# Patient Record
Sex: Male | Born: 1935 | Race: White | Hispanic: No | Marital: Married | Smoking: Never smoker
Health system: Southern US, Community
[De-identification: ages and names within clinical notes are randomized; demographics above are authoritative.]

## PROBLEM LIST (undated history)

## (undated) DIAGNOSIS — N200 Calculus of kidney: Secondary | ICD-10-CM

## (undated) DIAGNOSIS — B351 Tinea unguium: Secondary | ICD-10-CM

## (undated) DIAGNOSIS — K5790 Diverticulosis of intestine, part unspecified, without perforation or abscess without bleeding: Secondary | ICD-10-CM

## (undated) DIAGNOSIS — I82409 Acute embolism and thrombosis of unspecified deep veins of unspecified lower extremity: Secondary | ICD-10-CM

## (undated) DIAGNOSIS — I739 Peripheral vascular disease, unspecified: Secondary | ICD-10-CM

## (undated) DIAGNOSIS — E119 Type 2 diabetes mellitus without complications: Secondary | ICD-10-CM

## (undated) DIAGNOSIS — R001 Bradycardia, unspecified: Secondary | ICD-10-CM

## (undated) DIAGNOSIS — K219 Gastro-esophageal reflux disease without esophagitis: Secondary | ICD-10-CM

## (undated) DIAGNOSIS — I4821 Permanent atrial fibrillation: Secondary | ICD-10-CM

## (undated) DIAGNOSIS — I2510 Atherosclerotic heart disease of native coronary artery without angina pectoris: Secondary | ICD-10-CM

## (undated) DIAGNOSIS — M109 Gout, unspecified: Secondary | ICD-10-CM

## (undated) DIAGNOSIS — I2721 Secondary pulmonary arterial hypertension: Secondary | ICD-10-CM

## (undated) DIAGNOSIS — I350 Nonrheumatic aortic (valve) stenosis: Secondary | ICD-10-CM

## (undated) DIAGNOSIS — R0602 Shortness of breath: Secondary | ICD-10-CM

## (undated) DIAGNOSIS — Z954 Presence of other heart-valve replacement: Secondary | ICD-10-CM

## (undated) DIAGNOSIS — N400 Enlarged prostate without lower urinary tract symptoms: Secondary | ICD-10-CM

## (undated) HISTORY — DX: Permanent atrial fibrillation: I48.21

## (undated) HISTORY — PX: PARAESOPHAGEAL HERNIA REPAIR: SHX2161

## (undated) HISTORY — PX: CORONARY ARTERY BYPASS GRAFT: SHX141

## (undated) HISTORY — DX: Gastro-esophageal reflux disease without esophagitis: K21.9

## (undated) HISTORY — DX: Tinea unguium: B35.1

## (undated) HISTORY — PX: HERNIA REPAIR: SHX51

## (undated) HISTORY — DX: Gout, unspecified: M10.9

---

## 1998-03-10 ENCOUNTER — Emergency Department (HOSPITAL_COMMUNITY): Admission: EM | Admit: 1998-03-10 | Discharge: 1998-03-10 | Payer: Self-pay | Admitting: Emergency Medicine

## 1998-03-16 ENCOUNTER — Ambulatory Visit (HOSPITAL_COMMUNITY): Admission: RE | Admit: 1998-03-16 | Discharge: 1998-03-16 | Payer: Self-pay | Admitting: Gastroenterology

## 1998-03-16 ENCOUNTER — Encounter: Payer: Self-pay | Admitting: Gastroenterology

## 2002-02-26 ENCOUNTER — Encounter: Payer: Self-pay | Admitting: Emergency Medicine

## 2002-02-27 ENCOUNTER — Encounter: Payer: Self-pay | Admitting: Internal Medicine

## 2002-02-27 ENCOUNTER — Inpatient Hospital Stay (HOSPITAL_COMMUNITY): Admission: EM | Admit: 2002-02-27 | Discharge: 2002-03-03 | Payer: Self-pay | Admitting: *Deleted

## 2002-03-01 ENCOUNTER — Encounter (INDEPENDENT_AMBULATORY_CARE_PROVIDER_SITE_OTHER): Payer: Self-pay | Admitting: Cardiology

## 2002-10-13 ENCOUNTER — Emergency Department (HOSPITAL_COMMUNITY): Admission: EM | Admit: 2002-10-13 | Discharge: 2002-10-13 | Payer: Self-pay | Admitting: Emergency Medicine

## 2002-10-13 ENCOUNTER — Encounter: Payer: Self-pay | Admitting: Emergency Medicine

## 2004-02-29 HISTORY — PX: AORTIC VALVE REPLACEMENT: SHX41

## 2004-04-19 ENCOUNTER — Emergency Department (HOSPITAL_COMMUNITY): Admission: EM | Admit: 2004-04-19 | Discharge: 2004-04-19 | Payer: Self-pay | Admitting: Emergency Medicine

## 2004-04-28 ENCOUNTER — Ambulatory Visit: Payer: Self-pay | Admitting: Dentistry

## 2004-04-28 ENCOUNTER — Inpatient Hospital Stay (HOSPITAL_COMMUNITY): Admission: RE | Admit: 2004-04-28 | Discharge: 2004-05-04 | Payer: Self-pay | Admitting: *Deleted

## 2004-05-03 ENCOUNTER — Ambulatory Visit: Payer: Self-pay | Admitting: Dentistry

## 2004-05-07 ENCOUNTER — Encounter: Admission: AD | Admit: 2004-05-07 | Discharge: 2004-05-07 | Payer: Self-pay | Admitting: Dentistry

## 2004-05-11 ENCOUNTER — Encounter (INDEPENDENT_AMBULATORY_CARE_PROVIDER_SITE_OTHER): Payer: Self-pay | Admitting: Specialist

## 2004-05-11 ENCOUNTER — Inpatient Hospital Stay (HOSPITAL_COMMUNITY): Admission: RE | Admit: 2004-05-11 | Discharge: 2004-05-16 | Payer: Self-pay | Admitting: Cardiothoracic Surgery

## 2004-06-21 ENCOUNTER — Encounter (HOSPITAL_COMMUNITY): Admission: RE | Admit: 2004-06-21 | Discharge: 2004-09-19 | Payer: Self-pay | Admitting: *Deleted

## 2005-06-22 ENCOUNTER — Emergency Department (HOSPITAL_COMMUNITY): Admission: EM | Admit: 2005-06-22 | Discharge: 2005-06-22 | Payer: Self-pay | Admitting: Emergency Medicine

## 2005-07-12 ENCOUNTER — Encounter (INDEPENDENT_AMBULATORY_CARE_PROVIDER_SITE_OTHER): Payer: Self-pay | Admitting: *Deleted

## 2005-07-12 ENCOUNTER — Inpatient Hospital Stay (HOSPITAL_COMMUNITY): Admission: AD | Admit: 2005-07-12 | Discharge: 2005-07-14 | Payer: Self-pay | Admitting: General Surgery

## 2006-11-24 ENCOUNTER — Encounter: Admission: RE | Admit: 2006-11-24 | Discharge: 2006-11-24 | Payer: Self-pay | Admitting: Orthopedic Surgery

## 2006-12-05 ENCOUNTER — Encounter: Admission: RE | Admit: 2006-12-05 | Discharge: 2006-12-05 | Payer: Self-pay | Admitting: Orthopedic Surgery

## 2010-07-16 NOTE — Cardiovascular Report (Signed)
NAMEMYKAL, BATIZ NO.:  [id number]   MEDICAL RECORD NO.:  [id number]          PATIENT TYPE:  OIB   LOCATION:  2899                         FACILITY:  [facility]   PHYSICIAN:  Meade Maw, M.D.    DATE OF BIRTH:  09-27-1925   DATE OF PROCEDURE:  04/28/2004  DATE OF DISCHARGE:                              CARDIAC CATHETERIZATION   INDICATIONS FOR PROCEDURE:  A 77 year old gentleman with new onset chest  pain and known calcific aortic stenosis with an aortic valve area of 1.1 sq  cm.   DESCRIPTION OF PROCEDURE:  After obtaining written informed consent, the  patient was brought to the cardiac catheterization lab in a postabsorptive  state.  Preoperative sedation was achieved using IV Versed 1 mg.  The right  groin was prepped and draped in the usual sterile fashion.  Local anesthesia  was achieved using 1% Xylocaine.  A 6 French hemostasis sheath was placed  into the right femoral artery using the modified Seldinger technique.  Selective coronary angiography was performed using a JL4, JR4 Judkins  catheter.  Multiple views were obtained.  All catheter exchanges were made  over a guidewire.  The hemostasis sheath was flushed following each catheter  exchange.  Left heart catheterization was performed using a Feldman  catheter.  This was then exchanged for a 6 French pigtail curved catheter.  Single plane ventriculogram was performed in the RAO position.  The patient  was then transferred to the holding area.  There was no immediate  complication.   FINDINGS:  The aortic pressure was 104/62.  LV pressure was 152/8 with an  EDP of 11.  His mean gradient was noted to be 33 mmHg.  Single plane  ventriculogram revealed normal wall motion, ejection fraction of 65%.   Coronary angiography:  The left main coronary artery is short.  It  bifurcates into the left anterior descending and circumflex.  This is a  conjoined takeoff.   The left anterior descending:  The left  anterior descending gives rise to a  large D1 and ends as a small tapering apical branch.   The circumflex vessel is a large caliber vessel giving rise to a large OM1,  a large OM2, large OM3 and ends as an AV groove vessel. There is no disease  noted in the circumflex or its branches.   Right coronary artery: The right coronary artery is a moderate size vessel.  There is 100% occlusion in the right coronary artery.  There appears to be  thrombus in the right coronary artery.   FINAL IMPRESSION:  1.  A 100% occlusion of the mid right coronary artery with thrombotic      appearing material.  This is most likely      accounting for his chest pain.  2.  Severe calcific aortic stenosis.   RECOMMENDATIONS:  The patient will be admitted.  CVTS consult will be  obtained.      HP/MEDQ  D:  04/28/2004  T:  04/28/2004  Job:  [id number]   cc:   Georgann Housekeeper, MD  [address]  [address]  [place]  [place] [address]  Fax: [phone]

## 2010-07-16 NOTE — Discharge Summary (Signed)
NAMEADE, STMARIE NO.:  [id number]   MEDICAL RECORD NO.:  [id number]          PATIENT TYPE:  INP   LOCATION:  3708                         FACILITY:  [facility]   PHYSICIAN:  Meade Maw, M.D.    DATE OF BIRTH:  04/27/1925   DATE OF ADMISSION:  04/28/2004  DATE OF DISCHARGE:  05/04/2004                                 DISCHARGE SUMMARY   CHIEF COMPLAINT AND REASON FOR ADMISSION:  Mr. Dentinger is a patient with  known severe aortic stenosis secondary to calcification.  He was admitted on  April 28, 2004 post cardiac catheterization.  That catheterization revealed  severe calcific aortic stenosis, 100% mid vessel occlusion of right coronary  artery with thrombus.  The patient also had a pre-admission history of chest  pain.  Based on these findings, the patient was admitted for further CVTS  evaluation regarding single vessel coronary artery disease and severe  calcific aortic stenosis.   HOSPITAL COURSE:  Problem 1.  Severe single vessel coronary artery disease  with right coronary artery and thrombus.  The patient was admitted and  started on IV heparin with plans to keep the patient in the hospital until  surgery unless otherwise recommended by CVTS.  He was stable without  evidence of chest pain during the hospitalization and tolerated the heparin  without problems related to anemia or bleeding.  Because the patient had a  severe aortic stenosis as well, and issues related to significant dental  caries, a dental consultation was obtained.  Their recommendation was to  proceed with dental surgery.  The patient underwent multiple dental  extractions on Monday, May 03, 2004 without any problems.  He was followed  by Dr. Kristin Bruins in the hospital for this and CVTS stated that the patient,  at this point, would be okay to be discharged home with returning in one  week for coronary artery bypass graft, aortic valve replacement and Mae's  procedure.  The patient was  subsequently discharged home on May 04, 2004  with plans to return on May 11, 2004 for stated open heart surgery  procedure.   FINAL DISCHARGE DIAGNOSES:  1.  Severe single vessel coronary artery disease involving the right      coronary artery with thrombus status post IV heparin treatment.  2.  Severe calcific aortic stenosis.  3.  Hypertension, controlled.  4.  Dyslipidemia.  5.  Atrial flutter, rate controlled.  6.  Diabetes.  7.  Status post multiple dental extractions pending aortic valve      replacement.   DISCHARGE MEDICATIONS:  1.  Altace 5 mg daily.  2.  Aspirin 81 mg daily.  3.  Zantac 150 mg b.i.d.  4.  Zocor 40 mg daily.  5.  Glimepiride 4 mg daily.  6.  Aciphex 20 mg daily.  7.  Amoxicillin 500 mg t.i.d. for six days.  8.  Percocet 5/325 one to two every six hours as needed for dental pain.   PAIN MANAGEMENT:  Tylenol when not utilizing Percocet.   ACTIVITY:  No strenuous activity until after surgery completed and per  CVTS  recommendations.   DIET:  Heart healthy.   WOUND CARE:  Per recommendations of the dental surgeon.  Salt water rinses  as needed.   ADDITIONAL INSTRUCTIONS:  You are to call Dr. Lindell Spar office if you  experience any more chest pain.  You are to call Dr. Kristin Bruins for an  appointment or if you have any trouble with your mouth.  This is at  [phone]  [phone].   FOLLOW-UP APPOINTMENTS:  You are scheduled to have open heart surgery with  valve replacement on Tuesday, May 11, 2004.  You are to call Dr. Donata Clay  at [phone] if you have any questions regarding your surgery.   The patient already has post coronary artery bypass graft follow-up plan  with Dr. Fraser Din on June 02, 2004 at 11:15 a.m.  He is to go in before this  at 10:30 a.m. on same date for chest x-ray.      ALE/MEDQ  D:  06/28/2004  T:  06/28/2004  Job:  [id number]   cc:   Mikey Bussing, M.D.  [address]  [place]  [place] [address]   Charlynne Pander,  D.D.S.  [facility]  [address]  [place]  [place] [address]  Fax: [phone]   Georgann Housekeeper, MD  [address]  [place]  [place] [address]  Fax: [phone]

## 2010-07-16 NOTE — Discharge Summary (Signed)
NAMECHRISTIFER, [name]               ACCOUNT NO.:  [id number]   MEDICAL RECORD NO.:  [id number]          PATIENT TYPE:  INP   LOCATION:  1615                         FACILITY:  [facility]   PHYSICIAN:  Sharlet Salina T. Hoxworth, M.D.DATE OF BIRTH:  1925-05-06   DATE OF ADMISSION:  07/12/2005  DATE OF DISCHARGE:  07/14/2005                                 DISCHARGE SUMMARY   DISCHARGE DIAGNOSIS:  Infected hematoma, right thigh.   PROCEDURES:  Incision, drainage and debridement of infected hematoma, right  thigh, Jul 12, 2005.   HISTORY OF PRESENT ILLNESS:  [name] is a 77 year old male who was  accidentally run over by his tractor about 2 weeks prior to admission  sustaining injury to his right thigh.  There was no break in the skin.  He  was on chronic anticoagulation with Coumadin and developed a large area of  progressive swelling over his right thigh.  His Coumadin has been stopped.  He has been followed by his primary care physician with increasing swelling,  pain, some redness and tenderness developing at his recent evaluation and  was referred for further treatment.   PAST MEDICAL HISTORY:  1.  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  2.  Abdominal surgery including bowel obstruction and incisional hernia      repair.   MEDICATIONS:  1.  Toprol XL 25 mg daily.  2.  Altace 5 mg daily.  3.  Cephalexin 5 mg t.i.d. during this current illness.  4.  Glimepiride 4 mg q.a.m. and 2 mg in the evening.  5.  Ranitidine 150 mg b.i.d.  6.  Simvastatin 40 mg nightly.  7.  Aspirin and Coumadin which had been stopped during the current illness.   ALLERGIES:  DEMEROL.   For social history, family history and review of systems, see the admission  History and Physical.   PHYSICAL EXAMINATION:  VITAL SIGNS:  Afebrile.  Vital signs within normal  limits.  Pertinent findings are limited to the right thigh which revealed  approximately 19 x 14-cm area of soft swelling and tenderness with  a central  eschar measuring about 8 x 5 cm.  There is a fair amount of cellulitis of  the surrounding skin.   HOSPITAL COURSE:  The patient was admitted to [facility] and  underwent incision and debridement of a large right thigh hematoma with  debridement of necrotic skin and subcutaneous tissue overlying this.  The  wound was packed open.  There was odor, although not any obvious pus.  He  was treated with IV antibiotic.  Culture eventually grew abundant Morganella  morgagni.  The patient was treated with moist saline wound packing b.i.d.  and had significant improvement in the cellulitis.  He became afebrile and  the wound appeared clean.  He is discharged home on Jul 14, 2005.   DISCHARGE MEDICATIONS:  The same as admission plus he is to resume his  regular anticoagulation regimen.   WOUND CARE:  Home Health will follow up for wound packing up.   FOLLOW UP:  Return to office in 1  week for followup.      Lorne Skeens. Hoxworth, M.D.  Electronically Signed     BTH/MEDQ  D:  08/15/2005  T:  08/16/2005  Job:  [id number]

## 2010-07-16 NOTE — Consult Note (Signed)
[name], [name] NO.:  [id number]   MEDICAL RECORD NO.:  [id number]          PATIENT TYPE:  OIB   LOCATION:  6527                         FACILITY:  [facility]   PHYSICIAN:  Kathlee Nations Trigt III, M.D.DATE OF BIRTH:  01-Oct-1925   DATE OF CONSULTATION:  04/28/2004  DATE OF DISCHARGE:                                   CONSULTATION   REASON FOR CONSULTATION:  Aortic stenosis and single vessel coronary artery  disease with chest pain and shortness of breath.   HISTORY OF PRESENT ILLNESS:  I was asked to evaluate this 77 year old white  male type 2 diabetic for potential aortic valve replacement and coronary  bypass surgery for recently diagnosed severe aortic stenosis and single  vessel coronary artery disease.  The patient has had a long history of a  cardiac murmur and known aortic stenosis and insufficiency following serial  2D echos.  His last echo in November 2005 demonstrated an aortic valve area  of 1.1 with a mean gradient of 30 mmHg and 1-2+ aortic insufficiency with  good LV function.  Last week, he developed symptoms of shortness of breath  and chest pain during some cold weather.  He was evaluated in the emergency  department where his cardiac enzymes were negative and his EKG showed no  ischemic changes.  He was released to the care of his primary care  physician, Dr. Donette Larry, who suspected coronary disease and obtained a  cardiology consultation.  Dr. Fraser Din scheduled a cardiac catheterization  today which demonstrated recent 100% occlusion of the right coronary, normal  left main, LAD, and circumflex, EF of 60%, and moderate to severe aortic  stenosis with transvalvular mean gradient of 33 mmHg and a calculated area  of 1.1.  There was mild to moderate aortic insufficiency.  The aortic  leaflets were fairly heavily calcified.  The patient was also noted to be in  atrial fibrillation/flutter of new onset and was placed on Cardizem.  The  patient was  admitted to the hospital following cardiac catheterization and a  cardiothoracic evaluation was requested.  The patient is currently stable in  the cardiac cath observation unit.   PAST MEDICAL HISTORY:  1.  Type 2 diabetes mellitus.  2.  Hypertension.  3.  Gastroesophageal reflux disease.  4.  Kidney stones.  5.  Mild BPH.  6.  Diverticular disease of the colon.  7.  Status post repair of paraesophageal hernia in 1984 with a left      thoracotomy followed several days later by a left anterior thoracotomy      and laparotomy for apparent obstruction.   ALLERGIES:  Demerol causes hallucinations.   CURRENT MEDICATIONS:  Cardizem CD 180 mg p.o. daily, Zocor 40 mg p.o. daily,  AcipHex 20 mg p.o. daily, Amaryl  4 mg p.o. daily, and aspirin 81 mg daily.   SOCIAL HISTORY:  The patient is a Biomedical engineer.  He is married and  lives with his wife in [place].  He has never smoked or  used alcohol.  He remains quite active.   FAMILY HISTORY:  Positive for gastric carcinoma in his father, positive  Alzheimer's disease in a brother, positive for diabetes.   REVIEW OF SYMPTOMS:  CONSTITUTIONAL:  Review is negative for weight loss or  fever.  HEENT:  Review is negative for change in vision or difficulty  swallowing.  He has not seen a dentist in several years.  He has upper total  dental plate and his dentition in his mandible was in poor condition.  Thoracic review is positive for his two thoracotomy procedures, left  posterolateral thoracotomy initially followed by left anterior thoracotomy  in 1984 for repair of his paraesophageal hernia.  He denies any significant  thoracic trauma.  He denies URI, bronchitis, or pneumonia this winter.  CARDIAC:  Review is positive for his shortness of breath and chest pain and  a long history of cardiac murmur since he had rheumatic fever at age 87.  GI:  Review is positive for gastroesophageal reflux disease, diverticular  disease,  and several incisional herniae which have been repaired with mesh  and, at this point, are stable.  He denies a change in bowel habits.  He  does have diverticular disease of the colon.  UROLOGICAL:  Review is  positive for kidney stones and BPH.  ENDOCRINE:  Review is positive for  diabetes, negative for thyroid disease.  HEMATOLOGIC:  Negative for bleeding  disorder or blood transfusion.  NEUROLOGICAL:  Negative for seizure or  stroke.   PHYSICAL EXAMINATION:  VITAL SIGNS:  The patient is 6 feet and weighs 200 pounds, blood pressure  140/74, heart rate 80, respirations 18.  GENERAL:  Pleasant, elderly white male in his hospital room in no acute  distress.  HEENT:  Normocephalic, full EOMs, pharynx clear, dentition in poor repair,  total upper dental plate.  NECK:  Supple JVD, mass, or carotid bruits.  LYMPHATICS:  Reveal no palpable supraclavicular or axillary adenopathy.  LUNGS:  Clear.  He has a well healed left posterolateral thoracotomy  incision and a left anterior thoracotomy epicostal margin.  CARDIAC EXAM:  Grade 3/6 systolic ejection murmur at the right upper sternal  border of aortic stenosis. There is no diastolic murmur.  ABDOMEN:  Soft without pulsatile mass, organomegaly, or tenderness.  EXTREMITIES:  No clubbing, edema, or cyanosis.  Peripheral pulses are 2+.  There are no diabetic ulcers of the skin.  NEUROLOGIC:  Alert and oriented without focal motor deficit.   LABORATORY DATA:  I reviewed the cardiac cath and 2D echo by Dr. Fraser Din and  agree with the diagnosis of significant aortic stenosis with severe single  vessel coronary artery disease and recent onset of atrial  fibrillation/flutter.   IMPRESSION:  The patient will need coronary artery bypass grafting with a  vein graft to the right coronary and aortic valve replacement with a  bioprosthetic valve and a possible Maze procedure.  Prior to cardiac surgery, however, he will need a dental evaluation possible  retraction of  necrotic teeth.  We will initiate the dental evaluation  and therapy as soon as possible.  He probably should remain in the hospital  to have his dental work done since he has significant aortic stenosis and  recent coronary occlusion.   Thank you for the opportunity to see this patient.      PV/MEDQ  D:  04/28/2004  T:  04/28/2004  Job:  [id number]   cc:   Meade Maw, M.D.  [address]  [place]  [place] [address]  Fax: [phone]  Georgann Housekeeper, MD  [address]  [place]  [place] [address]  Fax: [phone]

## 2010-07-16 NOTE — Consult Note (Signed)
NAMESULEYMAN, EHRMAN NO.:  [id number]   MEDICAL RECORD NO.:  [id number]          PATIENT TYPE:  INP   LOCATION:  6527                         FACILITY:  [facility]   PHYSICIAN:  Charlynne Pander, D.D.S.DATE OF BIRTH:  02/25/1926   DATE OF CONSULTATION:  04/30/2004  DATE OF DISCHARGE:                                   CONSULTATION   HISTORY OF PRESENT ILLNESS:  [name] is a 77 year old male referred by  Dr Kathlee Nations Trigt for a dental consultation.  Patient with recent  identification of severe aortic stenosis and single vessel coronary artery  disease.  Patient with anticipated aortic valve replacement along with  coronary artery bypass graft surgery with Dr. Donata Clay in the future.  Patient is now seen as part of a pre-heart valve surgery dental protocol to  rule out dental infection which may affect the patient's systemic health and  anticipated heart valve surgery.   PAST MEDICAL HISTORY:  1.  Coronary artery disease.      1.  Cardiac catheterization on April 28, 2004, which revealed single          vessel coronary artery disease of the right coronary artery,          moderate to severe aortic stenosis and ejection fraction of          approximately 65%.      2.  Anticipated single vessel coronary artery bypass graft with doctor          Kathlee Nations Trigt along with the aortic valve replacement.  2.  Severe aortic stenosis-anticipated aortic valve replacement heart      surgery with Dr. Donata Clay as above.  3.  Type 2 diabetes mellitus.  4.  Hypertension.  5.  Gastroesophageal reflux disorder.  6.  History of nephrolithiasis in August 2004.  7.  Recent onset of atrial fibrillation/flutter, possible Cox-Maze procedure      in the future.  8.  Benign prostatic hypertrophy - mild.  9.  History of diverticulosis of the colon.  10. History of gastric ulcer in 1982.  11. History of rheumatic fever in 1942.  12. History of DVT of the lower left extremity.  13. Hyperlipidemia, currently on Zocor.  14. History of cataracts.  15. Status post repair for periesophageal hernia in 1984.   ALLERGIES/ADVERSE DRUG REACTIONS:  DEMEROL CAUSES HALLUCINATIONS.   MEDICATIONS:  1.  Diltiazem 180 mg daily.  2.  Zocor 40 mg every evening.  3.  Amaryl 4 mg daily.  4.  Protonix 40 mg daily.  5.  Altace 5 mg daily.  6.  Aspirin 81 mg daily.  7.  Pepcid 20 mg twice daily.  8.  Chlorhexidine rinses twice daily.  9.  Heparin IV per protocol.  10. Tylox 1-2 q.3h. p.r.n. pain.   SOCIAL HISTORY:  The patient is married and lives with his wife.  The  patient is a retired Visual merchandiser.  The patient denies use of alcohol or tobacco  products.   FAMILY HISTORY:  Father died at the age of 24 from stomach cancer.  Mother  died at the age of 30.   FUNCTIONAL ASSESSMENT:  The patient was independent of her ADL's prior to  this admission.   REVIEW OF SYSTEMS:  As reviewed from the chart and health history assessment  forms this admission.   DENTAL HISTORY:   CHIEF COMPLAINT:  The patient is a pre-heart valve surgery dental protocol  evaluation.   HISTORY OF PRESENT ILLNESS:  Patient with recent identification of severe  aortic stenosis and single vessel coronary artery disease.  Patient with  anticipated aortic valve replacement along with a single vessel coronary  artery bypass graft heart procedure with Dr. Donata Clay.  The patient is now  seen as a part of a pre-heart valve surgery dental protocol to rule out  dental effects which may effect the patient's systemic health and  anticipated heart valve surgery.   The patient currently denies acute tooth ache, swellings or abscesses.  The  patient last saw dentist several years ago.  The patient indicates that he  had a filling placed at that time by Dr. Renata Caprice, who has since retired.  The  patient has no regular dentist at this time.  The patient has an upper  complete denture that fits pretty good.  The patient  indicates that the  initial denture was placed in 1976 and replaced in 1989, and the denture  works well.  The patient denies presence of a lower partial denture.  The  patient indicates that he had a lower left tooth breakoff in December 2005.  The patient, again, denies or swelling or problems with this tooth.   DENTAL EXAMINATION:  GENERAL:  The patient is a well-developed, well-  nourished male in no acute distress.  HEENT:  There is no palpable lymphadenopathy.  There are no acute TMJ  symptoms.  INTERNAL:  The patient has normal saliva.  There is no evidence of abscess  formation within the mouth.  There is no evidence of denture irritation.  DENTITION:  The patient is missing all maxillary teeth along with tooth  members 17, 18, 19, 28, 30, 31 and 32.  Tooth #20 is present as a root  segment.  Tooth #28 has been replaced as part of a bridge.  PERIODONTAL:  Patient with chronic periodontitis with plaque and calculus  accumulations, gingival recession, incipient tooth mobility and moderate  horizontal vertical bone loss.  DENTAL CARIES:  Tooth #22 appears to be affected by caries, and other caries  can be evaluated by obtaining dental x-rays in the future.  ENDODONTIC:  The patient currently denies acute pulpitis symptoms.  There  are no obvious periapical radiolucencies, although, the panoramic x-ray is  suboptimal.  Suggest obtaining dental x-rays in the future to rule out  incipient periapical pathology.  CROWN AND BRIDGE:  The patient has several crown and bridge restorations  which appear to be clinically acceptable at this time.  PROSTHODONTICS:  Patient with an upper complete denture which is acceptable  at this time.  The patient does not have a lower partial denture.  OCCLUSION:  Patient with a poor occlusal scheme, but stable occlusion at  this time.   STUDIES:  Radiographic interpretation of suboptimal panoramic x-ray was taken on April 29, 2004.   There are multiple  missing teeth.  There is a retained root segment in #20.  There is a dental caries associated with tooth #22.  There is moderate  horizontal vertical bone loss.  There is no obvious periapical pathology  noted.  ASSESSMENT:  1.  Plaque and calculus accumulations.  2.  Retained root segment in #20.  3.  No obvious acute pulpitis symptoms or presence of periapical pathology.  4.  Chronic periodontitis of bone loss.  5.  Gingival recession.  6.  Incipient tooth mobility.  7.  Multiple missing teeth.  8.  Poor occlusal scheme but stable occlusion.  9.  Clinically acceptable upper complete denture, but could be evaluated for      replacement to obtain a better occlusal scheme.  10. Current heparin therapy with risk for bleeding with invasive dental      procedures.  11. Need for antibiotic premedication prior to invasive dental procedures.  12. Need for follow up dental x-rays to rule out incipient dental caries or      incipient periapical pathology.   PLAN/RECOMMENDATION:  1.  I discussed the risks, benefits, complications of various treatment      options with the patient in relationship to his medical and dental      conditions and anticipated heart valve surgery and risk for infection      and subacute bacteria endocarditis.  We discussed various treatment      options to include no treatment, extraction of tooth #20 with      alveoloplasty as indicated, peridental therapy, dental restorations,      root canal therapy, chronic bridge therapy, implant therapy and      replacement of missing teeth as indicated.  We also discussed      replacement of the upper complete denture.  The patient currently wishes      to proceed with extraction of tooth #20 with alveoloplasty as indicated      along with a gross debridement of the remaining dentition in the      operating room, as time and space permits and the operating room      schedule.  Most likely this will be Monday morning at 9  a.m.  We will      use antibiotic premedication as indicated and discontinue heparin prior      to invasive dental procedures.  The patient will then need to follow up      for dental x-rays, periodic oral examination and evaluation for      treatment of dental caries noted in the mouth.   1.  Discussion of findings with Dr. Donata Clay and Dr. Meade Maw as      indicated.  We will discuss the ability to proceed with operating room      procedure on Monday May 03, 2004, for extraction tooth #20 with      alveoloplasty and dental cleaning as indicated.  The patient then should      be suitable for heart valve surgery in 2-3 days depending upon the      patient's coagulation and healing status.      RFK/MEDQ  D:  04/30/2004  T:  04/30/2004  Job:  [id number]   cc:   Mikey Bussing, M.D.  [address]  [place]  [place] [address]   Meade Maw, M.D.  [address] [place]  [place] [address]  Fax: [phone]

## 2010-07-16 NOTE — Discharge Summary (Signed)
NAMEAUSTON, HALFMANN NO.:  [id number]   MEDICAL RECORD NO.:  [id number]          PATIENT TYPE:  INP   LOCATION:  2011                         FACILITY:  [facility]   PHYSICIAN:  Kerin Perna, M.D.  DATE OF BIRTH:  Nov 03, 1925   DATE OF ADMISSION:  05/11/2004  DATE OF DISCHARGE:  05/16/2004                                 DISCHARGE SUMMARY   ADMISSION DIAGNOSES:  1.  Aortic stenosis.  2.  Single-vessel coronary artery disease.   PAST MEDICAL HISTORY AND DISCHARGE DIAGNOSES:  1.  Type 2 diabetes mellitus.  2.  Hypertension.  3.  Peripheral vascular disease.  4.  Gastroesophageal reflux disease.  5.  Kidney stones.  6.  Mild benign prostatic hypertrophy.  7.  Diverticular disease of the colon.  8.  Status post repair of paraesophageal hernia in 1984, with a left      thoracotomy, followed several day later by a left anterior thoracotomy      and laparotomy for repair of obstruction.  9.  Single-vessel coronary artery disease, status post coronary artery      bypass grafting times one.  10. Significant aortic stenosis, status post aortic valve replacement with a      21-mm pericardial tissue valve.   ALLERGIES:  DEMEROL which causes hallucinations.   BRIEF HISTORY:  The patient is a 77 year old Caucasian male who presented  initially to the cardiology service with chest pain.  He was found to be in  atrial fibrillation and had symptoms of CHF.  A 2D echocardiogram showed  probable significant moderate to severe aortic stenosis.  He then underwent  cardiac cath which confirmed the aortic stenosis and occlusion of a co-  dominant right coronary artery.  The ejection fraction was 40-50%.  Secondary to this information, Dr. Donata Clay of the CVTS service was  consulted regarding surgical revascularization and repair of the aortic  valve.  Prior to surgery, the patient underwent a dental exam which  discovered a mandibular necrotic tooth and he required a dental  extraction  and recovery prior to valve replacement.  The patient was discharged home  and then returned, on May 11, 2004, for AVR CABG procedure.   HOSPITAL COURSE:  The patient was admitted and taken to the OR, on May 11, 2004, for coronary artery bypass grafting x 1 and aortic valve replacement  with a 21-mm pericardial tissue valve.  A saphenous vein was harvested from  the right thigh in an open fashion and the saphenous vein was grafted to the  right coronary artery.  The patient tolerated the procedure well and was  hemodynamically stable immediately postoperatively.  The patient was  transferred from the OR to the SICU in stable condition.  The patient was  extubated without complication and woke up from anesthesia neurologically  intact.  The patient's postoperative course progressed as expected.  On postoperative  day one, he had a mildly elevated temperature of 100.8.  His vital signs  were otherwise stable.  He had at some point converted into atrial  fibrillation and was therefore, started on an  amiodarone drip.  At the time  on post-op day one, a bundle branch block was noted.  He was in stable  condition and therefore, his lines and chest tubes were discontinued in a  routine manner.  All drips were weaned accordingly.  His IV amiodarone was  also switched to p.o., all of this was done without difficulty.  The patient  began cardiac rehab on postoperative day one, and increased his tolerance to  a satisfactory level.  The patient was transferred from the SICU to unit  2000, again without difficulty.  The patient was started on Coumadin on postoperative day two secondary to  his pericardial tissue valve.  His diabetes mellitus was initially  controlled with sliding scale insulin and Lantus as well as his oral  medications from home.  His blood sugars were well controlled on his home  p.o. medicines and therefore, all insulin was discontinued.  The patient  continued to  progress well.  On postoperative day five, he was without complaint.  His bowel function had  returned, and he was ambulating well.  The patient was afebrile with stable  vital signs and maintaining atrial fibrillation on telemetry.  The patient  had been volume overloaded postoperatively and was diuresed accordingly.  On  physical exam, cardiac was irregularly irregular.  Lungs revealed faint  crackles in the left base.  The abdomen was benign.  The incisions were  clean, dry, and intact and there was only trace edema present to bilateral  lower extremities.  The patient was in a stable condition at that time with  his INR approaching a therapeutic value.  He was instructed to continue to  work on his pulmonary toilet and was discharged home in stable condition  with an established followup appointment on May 18, 2004 for an INR check  that was to be called to [facility].   LABS:  INR, on May 16, 2004, was 1.5.  CBC, on May 15, 2004, white count  8.5, hemoglobin 12.1, hematocrit 35.3, platelets 113.  BMP, on May 14, 2004, sodium 137, potassium 4.5, BUN 23, creatinine 1.1, glucose 138.   CONDITION ON DISCHARGE:  Improved.   INSTRUCTIONS:  1.  Medications:      1.  Toprol XL 25 mg every day.      2.  Altace 5 mg every day.      3.  Coumadin 5 mg every day.      4.  Protonix 40 mg every day.      5.  Lasix 40 mg every day x 10 days.      6.  K-Dur 20 mEq every day x 10 days.      7.  Amaryl 5 mg every day.      8.  Tylox 1-2 q.4-6h. p.r.n. pain.  2.  Activity:  No driving, no lifting more than 10 pounds, and the patient      was instructed to continue daily breathing and walking exercises.  3.  Wound care:  The patient should shower daily and clean the incisions      with soap and water.  If any problems arise, the patient should contact      the CVTS office at [phone].   FOLLOWUP APPOINTMENT: 1.  PT and INR blood work to be drawn on May 18, 2004, by home health       nurse and the results called to Dr. Lindell Spar office.  2.  With Dr. Fraser Din two weeks  after discharge.  The patient was instructed      to contact her office to arrange an      appointment two weeks after discharge at [phone].  A chest x-ray was      taken at the time of that appointment and the patient was instructed to      bring that to the office appointment with Dr. Donata Clay.  3.  Dr. Donata Clay on June 04, 2004 at 1:15 p.m.      AY/MEDQ  D:  07/14/2004  T:  07/14/2004  Job:  [id number]   cc:   Kerin Perna, M.D.  [address]  [place]  [place] [address]   Meade Maw, M.D.  [address]  [place]  [place] [address]  Fax: [phone]   patient's hospital chart

## 2010-07-16 NOTE — Op Note (Signed)
[name], [name] NO.:  [id number]   MEDICAL RECORD NO.:  [id number]          PATIENT TYPE:  INP   LOCATION:  2312                         FACILITY:  [facility]   PHYSICIAN:  Kathlee Nations Trigt III, M.D.DATE OF BIRTH:  1925-07-09   DATE OF PROCEDURE:  05/11/2004  DATE OF DISCHARGE:                                 OPERATIVE REPORT   OPERATION:  1.  Coronary bypass grafting x1 (saphenous vein graft to right coronary      artery).  2.  Aortic valve replacement with a 21-mm pericardial tissue valve (Edwards,      model 2700 serial number [id number]).   PREOPERATIVE DIAGNOSIS:  Single-vessel coronary disease with occluded right  coronary and significant aortic stenosis.   POSTOPERATIVE DIAGNOSIS:  Single-vessel coronary disease with occluded right  coronary and significant aortic stenosis.   SURGEON:  Kerin Perna, M.D.   ASSISTANT:  Rowe Clack, P.A.-C.   ANESTHESIA:  General.   INDICATIONS:  The patient is a 77 year old white male who presented to the  cardiology service with chest pain. He is found to have atrial fibrillation  and symptoms of CHF. A 2-D echo showed probable significant moderate to  severe aortic stenosis. He underwent cardiac catheterization which confirmed  aortic stenosis with a mean gradient over 35 mmHg and occlusion of a  codominant right coronary artery.  His EF was 40-50% and he was felt to be  candidate for coronary bypass surgery and aortic valve replacement.  Prior  to surgery, he underwent a dental exam which discovered a mandibular  necrotic tooth and he required dental extraction and recovery before the  valve replacement. He now returns for his AVR - CABG.   Prior to this admission I had examined the patient and discussed in detail  the procedure of CABG plus aortic valve replacement for treatment of his  coronary disease and aortic stenosis. I discussed with the patient and his  wife, the major aspects of the planned procedure  including the choice of a  bioprosthetic valve, location of the surgical incisions, the use of general  anesthesia and cardiopulmonary bypass, and the plan to use a vein graft  harvested from the leg for the bypass graft. He understood the risks to him  of this operation including risks of MI, CVA, bleeding, infection, and  death. After reviewing all these details. The patient demonstrated an  understanding of procedure and agreed to proceed with operation as planned  under what I felt was an informed consent.   OPERATIVE FINDINGS:  The saphenous vein was harvested using open technique  from the right thigh. The aortic valve was heavily calcified and was with  moderate to severe stenosis. The patient was given aprotinin protocol for  this operation. He did not require any blood transfusions. He was  cardioverted to a sinus rhythm and left the operating room AV sequentially  paced.   PROCEDURE:  The patient was brought to operating room and place supine on  the operating table, general anesthesia was induced under invasive  hemodynamic monitoring. The transesophageal 2-D echo probe was  placed by the  anesthesiologist which confirmed the preoperative diagnosis of moderate to  severe aortic stenosis. A sternal incision was made as the saphenous vein  was harvested from the right thigh. The pericardium was opened and suspended  as a cradle. Heparin was administered and the ACT was documented as being  therapeutic according to the aprotinin protocol. Pursestrings were placed in  the ascending aorta and right atrium and the patient was cannulated and  placed on bypass. The coronary was identified for grafting and the vein  graft was prepared for the distal anastomosis. A left ventricular vent was  placed via the right superior pulmonary vein. Cardioplegia catheters were  placed for both antegrade aortic and retrograde coronary sinus cardioplegia.  The patient was cooled to 30 degrees. Aortic  crossclamp was applied and 800  mL of cold blood cardioplegia was delivered in split doses between the  antegrade aortic and retrograde coronary sinus catheters. There was good  cardioplegic arrest and septal temperature dropped less than 14 degrees.  Topical iced saline was used to augment myocardial preservation and a  pericardial insulator pad was used protect left phrenic nerve.   The distal coronary anastomosis was performed first. This was placed to the  main right coronary and the AV groove.  There was 1.5 mm vessel with total  proximal occlusion. Reverse saphenous vein was sewn end-to-side with running  8-0 Prolene.  There was good flow through graft. Cardioplegia was redosed.   A transverse aortotomy was performed. The aortic valve was inspected and  found to have three leaflets which were heavily calcified, especially in the  noncoronary cusp. The aortic valve was excised and the annulus debrided of  calcium. Outflow tract was irrigated copious amounts of cold saline. The  annulus was sized to 21 mm pericardial tissue valve. Subannular 2-0 Ethibond  pledgeted mattress sutures were placed around the annulus numbering 15.  These were then placed to the sewing ring of the Edwards  pericardial valve  which had been rinsed and prepared according to protocol. The valve seated  and sutures were tied. There was good orientation of the valve without  obstruction of the coronary ostia. The aortotomy was closed in two layers  using a running #4-0 Prolene. The crossclamp was left in place and  cardioplegia was redosed. A proximal vein anastomosis was performed using a  4.0 mm punch and running 6-0 Prolene. Air was flushed from the coronaries  and left side of heart using a dose of retrograde warm blood cardioplegia as  well as the usual de-airing maneuvers on bypass. The ascending aortic vent  was left in place as the aortic crossclamp was removed and the heart was  perfused.   The heart  was cardioverted back to a regular rhythm. Air was aspirated from  the vein graft and the vein graft was opened and had good flow. Hemostasis  was documented at the aortotomy as well as the proximal and distal  anastomoses. The patient was rewarmed to 37 degrees. Cardioplegia cannulas  were removed. The vent was removed. Temporary pacing wires were applied.  Lungs were re-expanded. The patient was weaned from bypass when he rewarmed  to 37 degrees. He was placed on low-dose dopamine. There was good cardiac  output and blood pressure. The right femoral A-line was placed due to a  dampened radial artery pressure tracing. The patient was given heparin  without adverse reaction. The mediastinum was irrigated with warm antibiotic  irrigation. The leg incision was  irrigated and closed in a standard fashion.  The superior pericardial fat was closed over the aorta and vein grafts. Two  mediastinal chest tubes were placed and brought out through a separate  incision. The sternum was  closed with interrupted steel wire. The pectoralis fascia was closed in  running #1 Vicryl. The subcutaneous and skin layers were closed in running  Vicryl and sterile dressings were applied. Total bypass time was 125  minutes, with crossclamp time of 80 minutes.      PV/MEDQ  D:  05/11/2004  T:  05/11/2004  Job:  [id number]   cc:   [facility]   CVTS office

## 2010-07-16 NOTE — Op Note (Signed)
NAMEJAVYON, [name]               ACCOUNT NO.:  [id number]   MEDICAL RECORD NO.:  [id number]          PATIENT TYPE:  INP   LOCATION:  1615                         FACILITY:  [facility]   PHYSICIAN:  Sharlet Salina T. Hoxworth, M.D.DATE OF BIRTH:  03-22-1925   DATE OF PROCEDURE:  07/12/2005  DATE OF DISCHARGE:                                 OPERATIVE REPORT   PREOPERATIVE DIAGNOSIS:  Hematoma/abscess and soft tissue infection right  thigh.   POSTOPERATIVE DIAGNOSIS:  Hematoma/abscess and soft tissue infection right  thigh.   SURGICAL PROCEDURES:  I&D and debridement of right thigh.   SURGEON:  Lorne Skeens. Hoxworth, M.D.   ANESTHESIA:  General.   BRIEF HISTORY:  [name] is a 77 year old male who accidentally ran over  his right thigh with his tractor approximatey three weeks ago.  He was on  Coumadin at that time.  He developed large area of pain and swelling.  He  has been followed as an outpatient by his primary care physician.  His  Coumadin was stopped two weeks ago.  He, however, has developed progressive  swelling, redness and skin changes overlying the area and was sent to our  office today for evaluation.  He was found to have a large probably infected  hematoma of the right thigh with cellulitis and overlying necrotic skin.  Incision, drainage and debridement in the operating room has been  recommended and accepted.  He has been given preoperative broad-spectrum  antibiotics.  The nature of the procedure, risks of bleeding, infection and  probable prolonged wound healing have been discussed and understood.  He is  now brought to the operating room for this procedure.   DESCRIPTION OF PROCEDURE:  Patient brought to the operating room and placed  in the supine position on the operating table and general endotracheal  anesthesia was induced.  He received preoperative antibiotics.  The right  lower extremity was sterilely prepped and draped.  Correct patient and  procedure were  verified.  On the anterior right thigh was an approximately  20 x 10 cm area of swelling and fluctuance with an overlying central  necrotic eschar measuring about 8 x 5 cm.  There was moderate surrounding  cellulitis.  I excised sharply into the necrotic eschar which was skin and  subcu and into the hematoma.  There was no frank pus but it did have a  definite unpleasant odor.  A large amount of clot and old liquid blood was  evacuated amounting to about 400 mL.  The central eschar was completely  necrotic skin and subcu and this was debrided back to healthy tissue in all  directions, again creating a wound about 8 or 9 x 4 to 5 cm.  The underlying  cavity was much larger than this.  This was copiously irrigated with warm  saline and all old clot removed.  This extended down to but did not appear  involved  the muscles of the anterior thigh.  After debridement of all necrotic tissue  and thorough irrigation of all the old blood and clot, the large cavity was  packed  with moist saline gauze.  Dry sterile dressing was applied.  The  patient was taken to recovery in good condition.      Lorne Skeens. Hoxworth, M.D.  Electronically Signed     BTH/MEDQ  D:  07/12/2005  T:  07/13/2005  Job:  [id number]

## 2010-07-16 NOTE — Discharge Summary (Signed)
[name], [name]                         ACCOUNT NO.:  [id number]   MEDICAL RECORD NO.:  [id number]                   PATIENT TYPE:  INP   LOCATION:  0351                                 FACILITY:  [facility]   PHYSICIAN:  Deirdre Peer. Polite, M.D.              DATE OF BIRTH:  12-21-1925   DATE OF ADMISSION:  02/26/2002  DATE OF DISCHARGE:  03/03/2002                                 DISCHARGE SUMMARY   DISCHARGE DIAGNOSES:  1. Diabetes mellitus, discharged on Amaryl 4 mg daily, hemoglobin A1C 13.7.  2. Rule out aspiration pneumonitis.  The patient will continue course of     Augmentin 500 mg b.i.d. for three more days to complete a 10-day course.  3. Esophageal stricture, asymptomatic during this hospitalization.  4. Hypoxia secondary to #1.  No requirements for home oxygen at discharge     _________ pulse oximetry 91-93% on room air.  5. Heart murmur, _____________ preserved normal systolic function on echo.     No wall motion abnormality.  Some mild aortic regurgitation.  Antibiotic     prophylaxis recommended prior to invasive procedures.  6. Dehydration secondary to new-onset diabetes, resolved at the time of     discharge.   DISCHARGE MEDICATIONS:  1. Amaryl 4 mg 1 daily.  2. Altace 5 mg 1 daily.  3. Augmentin 500 mg 1 every 12 hours x 3 more days.   DISCHARGE INSTRUCTIONS:  The patient is to consume a diabetic diet, low-fat,  low-cholesterol, and limiting concentrated sweets.  The patient is asked to  check his blood sugars two times a day.  An Accu-Chek machine has been  provided to the patient.  He is asked to take these numbers to his primary  M.D. for further evaluation and treatment of his diabetes.  The patient is  asked to follow up with his primary M.D. as soon as possible for further  treatment of his diabetes.   CONSULTATIONS:  None.   HISTORY OF PRESENT ILLNESS:  The patient is a 77 year old male, history of  esophageal stricture.  Presents to the ED with  complaints of seven-day  history of cough, headache, fever.  The patient denied chest pain, shortness  of breath, abdominal pain, but increased frequency and polydipsia.  Evaluation in the ED was consistent with aspiration pneumonitis and newly  diagnosed diabetes.  Admission was deemed necessary for further evaluation  and treatment.   PAST MEDICAL HISTORY:  _______________ also consistent with rheumatic fever  as a child.   PAST SURGICAL HISTORY:  Hiatal hernia surgery x 2.   PHYSICAL EXAMINATION:  VITAL SIGNS:  On admission temperature 100.3, BP  90/57, pulse 85, respiratory rate 16, saturating at 92% on room air.  HEENT:  Within normal limits.  LUNGS:  Crackles in the left base.  No ___________ changes.  CARDIOVASCULAR:  Regular S1, S2.  With a 3/6 systolic ejection murmur,  holosystolic at  the left upper sternal border.  ABDOMEN:  Soft and nontender.  EXTREMITIES:  No edema.  NEUROLOGIC:  Nonfocal.   LABORATORY DATA:  BMET within normal limits except for CBC of 252.  UA  positive for glucose, ketones.  BNP was 14.   Chest x-ray:  Bilateral atelectasis.  Infiltrate could not be excluded.   HOSPITAL COURSE:  Problem 1:  The patient was admitted to ________________  bed for evaluation and treatment of possible aspiration pneumonitis.  The  patient was started on empiric antibiotics of Zosyn ____________ to p.o.  Augmentin.  The patient tolerated the medication well.  There were no fever  spikes or peaks in white blood cell count.  The patient's ABG found it did  show hypoxia with a pO2 of 39.  Follow-up chest x-ray showed bibasilar  opacities.  The patient continued to improve and was treated conservatively.  Was given incentive spirometer, and follow-up ambulatory pulse oximetry  showed the patient had O2 saturations of 91 and 93% on room air.  The  patient will continue course of antibiotics for presumed aspiration  pneumonitis.   Problem 2:  Newly diagnosed diabetes,  hemoglobin A1C of 13.  The patient was  started on p.o. medication in the hospital in the form of Amaryl, had fairly  decent control of his blood sugars.  The patient will be continued on this  regimen for now.  He was asked to modify his diet and increase exercise,  i.e., walking five times a week for 30 minutes.  Further follow-up and  medication modification per primary M.D.   Problem 3:  History of esophageal stricture.  No problems during this  hospitalization.   Problem 4:  As stated, the patient had a 2-D echo performed for evaluation.  Echo revealed normal systolic function, EF 55-65%.  No evidence for wall  motion abnormality or valve thickness was increased.  There was mild aortic  regurgitation.  There was mild mitral regurgitation as well.  Left atrium  was mildly dilated.   At this time the patient is medically stable and will be discharged to home  where he will have outpatient follow-up.  Of note, the patient had slightly  elevated TSH; however, the patient displayed no symptoms of hypothyroid as  he has an acute ________________.  It is recommended that the patient have  follow-up chest x-ray on an outpatient basis by his primary M.D. in  approximately two months.  TSH was 5.6 with normal range being 0.35-5.5.  Again, the patient is totally asymptomatic and recommend follow-up  evaluation when the patient is clinically stable.                                               Deirdre Peer. Polite, M.D.    RDP/MEDQ  D:  03/03/2002  T:  03/03/2002  Job:  [id number]

## 2010-07-16 NOTE — Op Note (Signed)
[name], [name]               ACCOUNT NO.:  [id number]   MEDICAL RECORD NO.:  [id number]          PATIENT TYPE:  INP   LOCATION:  3708                         FACILITY:  [facility]   PHYSICIAN:  Charlynne Pander, D.D.S.DATE OF BIRTH:  14-Jan-1926   DATE OF PROCEDURE:  05/03/2004  DATE OF DISCHARGE:                                 OPERATIVE REPORT   SURGEON:  Charlynne Pander, D.D.S.   PREOPERATIVE DIAGNOSIS:  1.  Aortic stenosis.  2.  Coronary artery disease.  3.  Pre-aortic valve replacement dental protocol.  4.  Retained root segment #20.  5.  Accretions.  6.  Dental caries.   POSTOPERATIVE DIAGNOSIS:  1.  Aortic stenosis.  2.  Coronary artery disease.  3.  Pre-aortic valve replacement dental protocol.  4.  Retained root segment #20.  5.  Accretions.  6.  Dental caries.   PROCEDURE:  1.  Dental examination.  2.  Surgical extraction of remaining root segment #20 with alveoloplasty.  3.  Gross debridement of the remaining dentition.   SURGEON:  Charlynne Pander, D.D.S.   ASSISTANT:  Tomasa Blase.   ANESTHESIA:  Monitored anesthesia care per the anesthesia team with [name], M.D. attending.   MEDICATIONS:  1.  Ampicillin 2.0 grams IV prior to invasive dental procedures.  2.  Local anesthesia with a total utilization of 1 Carpule containing 36 mg      of Xylocaine with 0.018 mg of epinephrine as well as 1 Carpule      containing 9 mg of Marcaine with 0.009 mg of epinephrine.   SPECIMENS:  There was one tooth which was discarded.   DRAINS:  None.   CULTURES:  None.   FLUIDS REPLACED:  400 mL of Ringer's lactate.   ESTIMATED BLOOD LOSS:  Less than 50 mL.   COMPLICATIONS:  The crown on tooth #21 fell off.  This will need to be  recemented in the future.  This can either be done at bedside as an  inpatient or preferable as an outpatient in the hospital dental clinic.   INDICATIONS FOR PROCEDURE:  The patient was diagnosed with severe aortic  stenosis and coronary artery disease.  The patient with anticipated aortic  valve replacement along with a single vessel coronary artery bypass graft  with Kerin Perna, M.D.  The patient was seen as part of a pre heart  valve surgery dental protocol to rule out dental infection which may affect  the patient's systemic health and anticipated heart valve surgery.   FINDINGS:  The patient was examined in operating room #10.  Tooth #20 was  identified for extraction.  The patient was noted to be affected by chronic  periodontitis, accretions, and presence of retained root segment #20.  Dental caries were also noted.  The aforementioned necessitated the surgical  extraction of tooth #20 with alveoloplasty as indicated along with gross  debridement of the remaining dentition at this time.  The patient will be  followed for further evaluation of dental caries in the future.   DESCRIPTION OF PROCEDURE:  The patient was brought  to the main operating  room #10.  The patient was then placed in the supine position on the  operating table.  Monitored anesthesia care was induced per the anesthesia  team.  The patient was then placed in reversed Trendelenburg position.  The  patient was then prepped and draped in the usual fashion for dental medicine  procedure.  The oral cavity was thoroughly examined with the findings as  noted above.  The patient was then ready for the dental medicine procedure  as follows:   Local anesthesia was administered at the initiation of the dental medicine  procedure with a total utilization of 1 Carpule containing 36 mg of  Xylocaine with 0.018 mg of epinephrine as well as 1 Carpule containing 9 mg  of Marcaine with 0.009 mg of epinephrine.   The mandibular left quadrant was first approached.  Anesthesia was delivered  as previously described.  A 15 blade incision was made from the distal of  #18 through the mesial of #21.  A surgical flap was then reflected.  The   root segment was attempted to be subluxated with a series of straight  elevators.  Minimal movement was noted at this time.  A surgical handpiece  and bur and copious amounts of sterile saline was then utilized to remove  the appropriate amount of buccal and interseptal bone as indicated.  The  retained root was then subluxated with a series of straight elevators and  eventually removed with the rongeurs appropriately.  Alveoloplasty was then  performed utilizing rongeurs and bone file.  The surgical site was then  irrigated with copious amounts of sterile saline.  A piece of Surgicel was  then placed in the extraction socket appropriately.  The surgical site was  then closed utilizing 3-0 chromic gut suture in a continuous interrupted  suture technique from the distal of #18 through the mesial of #20.  A  separate interrupted suture was placed interproximally between teeth #21 and  22 as indicated.  During the final subluxation of the root #20, the crown on  tooth #21 was displaced.  This crown will need to be recemented in the  future.  This preferably will occur as an outpatient at the Dental Medicine  clinic, but could be performed bedside as an inpatient if indicated.  The  patient and the patient's wife will be informed of this complication.  Dental caries were noted on tooth #22 and hopefully this will be able to be  restored in the dental medicine clinic at the time of the recementation  procedure.  At this point in time, the remaining teeth were approached.  A  Kavo Sonic scaler was utilized to remove the significant accretions  associated with the remaining teeth.  A series of hand curettes were then  utilized to remove further accretions and perform root planing.  The Kavo  Sonic scaler was then again utilized to remove further accretions.  At this  point in time, the entire mouth was irrigated with copious amounts of sterile saline.  The patient was examined for other  complications, seeing  none, the dental medicine procedure was deemed to be complete.  Series of 4  x 4 gauzes were placed in the extraction site to aid hemostasis.  The  patient was then  handed over to the anesthesia team for final disposition.  After appropriate  amount of time, the patient was taken to the postanesthesia care unit with  stable vital signs and a good oxygenation  level.  All counts are correct for  the dental medicine procedure.      RFK/MEDQ  D:  05/03/2004  T:  05/03/2004  Job:  [id number]   cc:   Meade Maw, M.D.  [address]  [place]  [place] [address]  Fax: [phone]   Mikey Bussing, M.D.  [address]  [place]  [place] [address]

## 2010-07-16 NOTE — Op Note (Signed)
NAMEJOSEPHUS, [name]               ACCOUNT NO.:  [id number]   MEDICAL RECORD NO.:  [id number]          PATIENT TYPE:  INP   LOCATION:  2312                         FACILITY:  [facility]   PHYSICIAN:  Kaylyn Layer. Michelle Piper, M.D.   DATE OF BIRTH:  12/02/1925   DATE OF PROCEDURE:  05/11/2004  DATE OF DISCHARGE:                                 OPERATIVE REPORT   PROCEDURE PERFORMED:  Transesophageal echocardiogram, cardiographic probe  placement, and monitoring to aortic valve replacement and coronary artery  bypass grafting.  The report is being done for Dr. Kathlee Nations Trigt to place  the transesophageal echo probe into Mr. [name] to evaluate his heart during  aortic valve replacement and coronary artery bypass grafting.  Mr.  Mckoy  is a 77 year old gentleman with known calcific aortic stenosis and coronary  artery disease.  After uneventful induction of anesthesia and endotracheal  intubation, the transesophageal echo probe was passed into the distal  esophagus.  I felt some resistance in the distal esophagus and, hence, did  not advance it all the way into the stomach.  I could not get a short axis  view of the left ventricle.  Because of this and due to his previous  esophageal and gastric surgery, I felt reluctant to pass the probe further.  In the distal esophagus, I could get the four chamber view of the heart with  good evaluation of the mitral and aortic valve.  The mitral valve coapted  normally, structurally looked normally, on placing color flow Doppler across  the valve, there was a trace of mitral regurgitation, the left atrium was  normal, intra-atrial septum was normal.  Turning to the aortic valve, it was  tricuspid, it was heavily calcified and due to the excessive calcification,  I could not evaluate the valve area.  In the longitudinal view, there was 2+  aortic insufficiency and turbulent supravalvular aortic stenosis.  The  intraventricular outflow tract was assured.  The 2+  aortic insufficiency.  Measuring the valve annulus, it was about 2.79 cm.  The right side of the  heart was within normal limits.  The patient was placed on cardiopulmonary  bypass by Dr. Donata Clay, and underwent aortic valve replacement and coronary  artery bypass grafting.  Immediately prior to discontinuation of  cardiopulmonary bypass, there was an intracardiac evaluation for air of  which there was a minimal amount and the to enter the aortic valve, the  prosthetic valve could be seen inside working well.  In the four chamber  view, the left ventricular outflow tract did not have any aortic  insufficiency and the longitudinal of the valve could seen to be working  well.  The rest of the heart is unchanged.  The mitral valve showed a trace  of mitral regurgitation and the right side of the heart was within normal  limits.  At the end of the procedure, the transesophageal echo probe was  uneventfully removed.  The patient went to the intensive care unit in stable  condition.      KDO/MEDQ  D:  05/11/2004  T:  05/11/2004  Job:  [id number]

## 2011-03-11 DIAGNOSIS — M79609 Pain in unspecified limb: Secondary | ICD-10-CM | POA: Diagnosis not present

## 2011-03-11 DIAGNOSIS — B351 Tinea unguium: Secondary | ICD-10-CM | POA: Diagnosis not present

## 2011-03-11 DIAGNOSIS — Z7901 Long term (current) use of anticoagulants: Secondary | ICD-10-CM | POA: Diagnosis not present

## 2011-03-11 DIAGNOSIS — I4892 Unspecified atrial flutter: Secondary | ICD-10-CM | POA: Diagnosis not present

## 2011-03-25 DIAGNOSIS — I4891 Unspecified atrial fibrillation: Secondary | ICD-10-CM | POA: Diagnosis not present

## 2011-03-25 DIAGNOSIS — I2510 Atherosclerotic heart disease of native coronary artery without angina pectoris: Secondary | ICD-10-CM | POA: Diagnosis not present

## 2011-03-25 DIAGNOSIS — I359 Nonrheumatic aortic valve disorder, unspecified: Secondary | ICD-10-CM | POA: Diagnosis not present

## 2011-04-21 DIAGNOSIS — I4892 Unspecified atrial flutter: Secondary | ICD-10-CM | POA: Diagnosis not present

## 2011-04-21 DIAGNOSIS — Z7901 Long term (current) use of anticoagulants: Secondary | ICD-10-CM | POA: Diagnosis not present

## 2011-04-28 DIAGNOSIS — L97509 Non-pressure chronic ulcer of other part of unspecified foot with unspecified severity: Secondary | ICD-10-CM | POA: Diagnosis not present

## 2011-05-02 DIAGNOSIS — L97509 Non-pressure chronic ulcer of other part of unspecified foot with unspecified severity: Secondary | ICD-10-CM | POA: Diagnosis not present

## 2011-05-03 ENCOUNTER — Other Ambulatory Visit: Payer: Self-pay | Admitting: Cardiology

## 2011-05-03 ENCOUNTER — Encounter (INDEPENDENT_AMBULATORY_CARE_PROVIDER_SITE_OTHER): Payer: Medicare Other

## 2011-05-03 DIAGNOSIS — I739 Peripheral vascular disease, unspecified: Secondary | ICD-10-CM

## 2011-05-03 DIAGNOSIS — L98499 Non-pressure chronic ulcer of skin of other sites with unspecified severity: Secondary | ICD-10-CM | POA: Diagnosis not present

## 2011-05-03 DIAGNOSIS — Z7901 Long term (current) use of anticoagulants: Secondary | ICD-10-CM | POA: Diagnosis not present

## 2011-05-03 DIAGNOSIS — I4892 Unspecified atrial flutter: Secondary | ICD-10-CM | POA: Diagnosis not present

## 2011-05-18 DIAGNOSIS — I4892 Unspecified atrial flutter: Secondary | ICD-10-CM | POA: Diagnosis not present

## 2011-05-18 DIAGNOSIS — E1149 Type 2 diabetes mellitus with other diabetic neurological complication: Secondary | ICD-10-CM | POA: Diagnosis not present

## 2011-05-18 DIAGNOSIS — E782 Mixed hyperlipidemia: Secondary | ICD-10-CM | POA: Diagnosis not present

## 2011-05-18 DIAGNOSIS — Z1331 Encounter for screening for depression: Secondary | ICD-10-CM | POA: Diagnosis not present

## 2011-05-18 DIAGNOSIS — G609 Hereditary and idiopathic neuropathy, unspecified: Secondary | ICD-10-CM | POA: Diagnosis not present

## 2011-05-18 DIAGNOSIS — I10 Essential (primary) hypertension: Secondary | ICD-10-CM | POA: Diagnosis not present

## 2011-05-20 DIAGNOSIS — B351 Tinea unguium: Secondary | ICD-10-CM | POA: Diagnosis not present

## 2011-05-20 DIAGNOSIS — M79609 Pain in unspecified limb: Secondary | ICD-10-CM | POA: Diagnosis not present

## 2011-06-02 DIAGNOSIS — Z7901 Long term (current) use of anticoagulants: Secondary | ICD-10-CM | POA: Diagnosis not present

## 2011-06-02 DIAGNOSIS — I4891 Unspecified atrial fibrillation: Secondary | ICD-10-CM | POA: Diagnosis not present

## 2011-06-30 DIAGNOSIS — Z7901 Long term (current) use of anticoagulants: Secondary | ICD-10-CM | POA: Diagnosis not present

## 2011-06-30 DIAGNOSIS — I4891 Unspecified atrial fibrillation: Secondary | ICD-10-CM | POA: Diagnosis not present

## 2011-08-09 DIAGNOSIS — Z7901 Long term (current) use of anticoagulants: Secondary | ICD-10-CM | POA: Diagnosis not present

## 2011-08-09 DIAGNOSIS — I4892 Unspecified atrial flutter: Secondary | ICD-10-CM | POA: Diagnosis not present

## 2011-08-16 DIAGNOSIS — E1149 Type 2 diabetes mellitus with other diabetic neurological complication: Secondary | ICD-10-CM | POA: Diagnosis not present

## 2011-08-16 DIAGNOSIS — G609 Hereditary and idiopathic neuropathy, unspecified: Secondary | ICD-10-CM | POA: Diagnosis not present

## 2011-08-16 DIAGNOSIS — E782 Mixed hyperlipidemia: Secondary | ICD-10-CM | POA: Diagnosis not present

## 2011-08-16 DIAGNOSIS — E162 Hypoglycemia, unspecified: Secondary | ICD-10-CM | POA: Diagnosis not present

## 2011-09-09 DIAGNOSIS — E782 Mixed hyperlipidemia: Secondary | ICD-10-CM | POA: Diagnosis not present

## 2011-09-09 DIAGNOSIS — Z1331 Encounter for screening for depression: Secondary | ICD-10-CM | POA: Diagnosis not present

## 2011-09-09 DIAGNOSIS — I4892 Unspecified atrial flutter: Secondary | ICD-10-CM | POA: Diagnosis not present

## 2011-09-09 DIAGNOSIS — E1149 Type 2 diabetes mellitus with other diabetic neurological complication: Secondary | ICD-10-CM | POA: Diagnosis not present

## 2011-09-09 DIAGNOSIS — I4891 Unspecified atrial fibrillation: Secondary | ICD-10-CM | POA: Diagnosis not present

## 2011-09-09 DIAGNOSIS — K219 Gastro-esophageal reflux disease without esophagitis: Secondary | ICD-10-CM | POA: Diagnosis not present

## 2011-09-09 DIAGNOSIS — Z0000 Encounter for general adult medical examination without abnormal findings: Secondary | ICD-10-CM | POA: Diagnosis not present

## 2011-09-09 DIAGNOSIS — I10 Essential (primary) hypertension: Secondary | ICD-10-CM | POA: Diagnosis not present

## 2011-09-09 DIAGNOSIS — Z7901 Long term (current) use of anticoagulants: Secondary | ICD-10-CM | POA: Diagnosis not present

## 2011-09-09 DIAGNOSIS — G609 Hereditary and idiopathic neuropathy, unspecified: Secondary | ICD-10-CM | POA: Diagnosis not present

## 2011-09-29 DIAGNOSIS — B351 Tinea unguium: Secondary | ICD-10-CM | POA: Diagnosis not present

## 2011-09-29 DIAGNOSIS — M79609 Pain in unspecified limb: Secondary | ICD-10-CM | POA: Diagnosis not present

## 2011-10-03 DIAGNOSIS — Z79899 Other long term (current) drug therapy: Secondary | ICD-10-CM | POA: Diagnosis not present

## 2011-10-21 DIAGNOSIS — I4891 Unspecified atrial fibrillation: Secondary | ICD-10-CM | POA: Diagnosis not present

## 2011-10-21 DIAGNOSIS — Z7901 Long term (current) use of anticoagulants: Secondary | ICD-10-CM | POA: Diagnosis not present

## 2011-11-03 DIAGNOSIS — E119 Type 2 diabetes mellitus without complications: Secondary | ICD-10-CM | POA: Diagnosis not present

## 2011-11-03 DIAGNOSIS — H25099 Other age-related incipient cataract, unspecified eye: Secondary | ICD-10-CM | POA: Diagnosis not present

## 2011-12-02 DIAGNOSIS — Z7901 Long term (current) use of anticoagulants: Secondary | ICD-10-CM | POA: Diagnosis not present

## 2011-12-02 DIAGNOSIS — I4892 Unspecified atrial flutter: Secondary | ICD-10-CM | POA: Diagnosis not present

## 2011-12-26 DIAGNOSIS — I4891 Unspecified atrial fibrillation: Secondary | ICD-10-CM | POA: Diagnosis not present

## 2011-12-26 DIAGNOSIS — I10 Essential (primary) hypertension: Secondary | ICD-10-CM | POA: Diagnosis not present

## 2011-12-26 DIAGNOSIS — I2510 Atherosclerotic heart disease of native coronary artery without angina pectoris: Secondary | ICD-10-CM | POA: Diagnosis not present

## 2012-01-11 DIAGNOSIS — Z23 Encounter for immunization: Secondary | ICD-10-CM | POA: Diagnosis not present

## 2012-01-18 DIAGNOSIS — I4891 Unspecified atrial fibrillation: Secondary | ICD-10-CM | POA: Diagnosis not present

## 2012-01-18 DIAGNOSIS — K219 Gastro-esophageal reflux disease without esophagitis: Secondary | ICD-10-CM | POA: Diagnosis not present

## 2012-01-18 DIAGNOSIS — G609 Hereditary and idiopathic neuropathy, unspecified: Secondary | ICD-10-CM | POA: Diagnosis not present

## 2012-01-18 DIAGNOSIS — E782 Mixed hyperlipidemia: Secondary | ICD-10-CM | POA: Diagnosis not present

## 2012-01-18 DIAGNOSIS — E1149 Type 2 diabetes mellitus with other diabetic neurological complication: Secondary | ICD-10-CM | POA: Diagnosis not present

## 2012-01-18 DIAGNOSIS — Z7901 Long term (current) use of anticoagulants: Secondary | ICD-10-CM | POA: Diagnosis not present

## 2012-01-18 DIAGNOSIS — I4892 Unspecified atrial flutter: Secondary | ICD-10-CM | POA: Diagnosis not present

## 2012-01-18 DIAGNOSIS — N183 Chronic kidney disease, stage 3 unspecified: Secondary | ICD-10-CM | POA: Diagnosis not present

## 2012-01-18 DIAGNOSIS — I10 Essential (primary) hypertension: Secondary | ICD-10-CM | POA: Diagnosis not present

## 2012-02-15 DIAGNOSIS — M79609 Pain in unspecified limb: Secondary | ICD-10-CM | POA: Diagnosis not present

## 2012-02-15 DIAGNOSIS — B351 Tinea unguium: Secondary | ICD-10-CM | POA: Diagnosis not present

## 2012-03-05 DIAGNOSIS — Z7901 Long term (current) use of anticoagulants: Secondary | ICD-10-CM | POA: Diagnosis not present

## 2012-03-05 DIAGNOSIS — I4891 Unspecified atrial fibrillation: Secondary | ICD-10-CM | POA: Diagnosis not present

## 2012-04-17 DIAGNOSIS — Z7901 Long term (current) use of anticoagulants: Secondary | ICD-10-CM | POA: Diagnosis not present

## 2012-04-17 DIAGNOSIS — I4891 Unspecified atrial fibrillation: Secondary | ICD-10-CM | POA: Diagnosis not present

## 2012-05-02 DIAGNOSIS — I4892 Unspecified atrial flutter: Secondary | ICD-10-CM | POA: Diagnosis not present

## 2012-05-02 DIAGNOSIS — E1149 Type 2 diabetes mellitus with other diabetic neurological complication: Secondary | ICD-10-CM | POA: Diagnosis not present

## 2012-05-02 DIAGNOSIS — E782 Mixed hyperlipidemia: Secondary | ICD-10-CM | POA: Diagnosis not present

## 2012-05-02 DIAGNOSIS — I10 Essential (primary) hypertension: Secondary | ICD-10-CM | POA: Diagnosis not present

## 2012-05-02 DIAGNOSIS — G609 Hereditary and idiopathic neuropathy, unspecified: Secondary | ICD-10-CM | POA: Diagnosis not present

## 2012-05-02 DIAGNOSIS — N183 Chronic kidney disease, stage 3 unspecified: Secondary | ICD-10-CM | POA: Diagnosis not present

## 2012-05-02 DIAGNOSIS — K219 Gastro-esophageal reflux disease without esophagitis: Secondary | ICD-10-CM | POA: Diagnosis not present

## 2012-05-29 DIAGNOSIS — I4891 Unspecified atrial fibrillation: Secondary | ICD-10-CM | POA: Diagnosis not present

## 2012-05-29 DIAGNOSIS — Z7901 Long term (current) use of anticoagulants: Secondary | ICD-10-CM | POA: Diagnosis not present

## 2012-07-10 DIAGNOSIS — Z7901 Long term (current) use of anticoagulants: Secondary | ICD-10-CM | POA: Diagnosis not present

## 2012-07-10 DIAGNOSIS — I4891 Unspecified atrial fibrillation: Secondary | ICD-10-CM | POA: Diagnosis not present

## 2012-08-28 DIAGNOSIS — I4891 Unspecified atrial fibrillation: Secondary | ICD-10-CM | POA: Diagnosis not present

## 2012-08-28 DIAGNOSIS — Z7901 Long term (current) use of anticoagulants: Secondary | ICD-10-CM | POA: Diagnosis not present

## 2012-08-30 DIAGNOSIS — B351 Tinea unguium: Secondary | ICD-10-CM | POA: Diagnosis not present

## 2012-08-30 DIAGNOSIS — M79609 Pain in unspecified limb: Secondary | ICD-10-CM | POA: Diagnosis not present

## 2012-08-30 DIAGNOSIS — Q828 Other specified congenital malformations of skin: Secondary | ICD-10-CM | POA: Diagnosis not present

## 2012-09-27 DIAGNOSIS — I10 Essential (primary) hypertension: Secondary | ICD-10-CM | POA: Diagnosis not present

## 2012-09-27 DIAGNOSIS — I2510 Atherosclerotic heart disease of native coronary artery without angina pectoris: Secondary | ICD-10-CM | POA: Diagnosis not present

## 2012-09-27 DIAGNOSIS — I495 Sick sinus syndrome: Secondary | ICD-10-CM | POA: Diagnosis not present

## 2012-09-27 DIAGNOSIS — I4891 Unspecified atrial fibrillation: Secondary | ICD-10-CM | POA: Diagnosis not present

## 2012-09-28 DIAGNOSIS — R001 Bradycardia, unspecified: Secondary | ICD-10-CM

## 2012-09-28 HISTORY — DX: Bradycardia, unspecified: R00.1

## 2012-10-02 DIAGNOSIS — I4891 Unspecified atrial fibrillation: Secondary | ICD-10-CM | POA: Diagnosis not present

## 2012-10-02 DIAGNOSIS — I2510 Atherosclerotic heart disease of native coronary artery without angina pectoris: Secondary | ICD-10-CM | POA: Diagnosis not present

## 2012-10-02 DIAGNOSIS — I495 Sick sinus syndrome: Secondary | ICD-10-CM | POA: Diagnosis not present

## 2012-10-02 DIAGNOSIS — I10 Essential (primary) hypertension: Secondary | ICD-10-CM | POA: Diagnosis not present

## 2012-10-03 ENCOUNTER — Encounter (HOSPITAL_COMMUNITY): Payer: Self-pay | Admitting: Emergency Medicine

## 2012-10-03 ENCOUNTER — Other Ambulatory Visit: Payer: Self-pay

## 2012-10-03 ENCOUNTER — Observation Stay (HOSPITAL_COMMUNITY)
Admission: EM | Admit: 2012-10-03 | Discharge: 2012-10-05 | Disposition: A | Discharge status: Home / Self Care | Payer: Medicare Other | Attending: Interventional Cardiology | Admitting: Interventional Cardiology

## 2012-10-03 ENCOUNTER — Emergency Department (HOSPITAL_COMMUNITY): Payer: Medicare Other

## 2012-10-03 DIAGNOSIS — Z952 Presence of prosthetic heart valve: Secondary | ICD-10-CM | POA: Insufficient documentation

## 2012-10-03 DIAGNOSIS — Z954 Presence of other heart-valve replacement: Secondary | ICD-10-CM | POA: Diagnosis not present

## 2012-10-03 DIAGNOSIS — Z7901 Long term (current) use of anticoagulants: Secondary | ICD-10-CM | POA: Diagnosis not present

## 2012-10-03 DIAGNOSIS — Z79899 Other long term (current) drug therapy: Secondary | ICD-10-CM | POA: Diagnosis not present

## 2012-10-03 DIAGNOSIS — I4891 Unspecified atrial fibrillation: Secondary | ICD-10-CM

## 2012-10-03 DIAGNOSIS — E119 Type 2 diabetes mellitus without complications: Secondary | ICD-10-CM | POA: Diagnosis not present

## 2012-10-03 DIAGNOSIS — Z951 Presence of aortocoronary bypass graft: Secondary | ICD-10-CM | POA: Insufficient documentation

## 2012-10-03 DIAGNOSIS — R001 Bradycardia, unspecified: Secondary | ICD-10-CM

## 2012-10-03 DIAGNOSIS — I2510 Atherosclerotic heart disease of native coronary artery without angina pectoris: Secondary | ICD-10-CM | POA: Diagnosis not present

## 2012-10-03 DIAGNOSIS — I498 Other specified cardiac arrhythmias: Principal | ICD-10-CM | POA: Insufficient documentation

## 2012-10-03 DIAGNOSIS — J9819 Other pulmonary collapse: Secondary | ICD-10-CM | POA: Diagnosis not present

## 2012-10-03 DIAGNOSIS — I059 Rheumatic mitral valve disease, unspecified: Secondary | ICD-10-CM | POA: Diagnosis not present

## 2012-10-03 HISTORY — DX: Peripheral vascular disease, unspecified: I73.9

## 2012-10-03 HISTORY — DX: Diverticulosis of intestine, part unspecified, without perforation or abscess without bleeding: K57.90

## 2012-10-03 HISTORY — DX: Benign prostatic hyperplasia without lower urinary tract symptoms: N40.0

## 2012-10-03 HISTORY — DX: Atherosclerotic heart disease of native coronary artery without angina pectoris: I25.10

## 2012-10-03 HISTORY — DX: Presence of other heart-valve replacement: Z95.4

## 2012-10-03 HISTORY — DX: Bradycardia, unspecified: R00.1

## 2012-10-03 HISTORY — DX: Calculus of kidney: N20.0

## 2012-10-03 HISTORY — DX: Nonrheumatic aortic (valve) stenosis: I35.0

## 2012-10-03 HISTORY — DX: Type 2 diabetes mellitus without complications: E11.9

## 2012-10-03 HISTORY — DX: Gastro-esophageal reflux disease without esophagitis: K21.9

## 2012-10-03 HISTORY — DX: Acute embolism and thrombosis of unspecified deep veins of unspecified lower extremity: I82.409

## 2012-10-03 HISTORY — DX: Shortness of breath: R06.02

## 2012-10-03 LAB — CBC WITH DIFFERENTIAL/PLATELET
Basophils Absolute: 0.1 10*3/uL (ref 0.0–0.1)
Eosinophils Absolute: 0.1 10*3/uL (ref 0.0–0.7)
Eosinophils Relative: 3 % (ref 0–5)
Lymphocytes Relative: 32 % (ref 12–46)
Lymphs Abs: 1.3 10*3/uL (ref 0.7–4.0)
MCH: 31.3 pg (ref 26.0–34.0)
Monocytes Absolute: 0.6 10*3/uL (ref 0.1–1.0)
Monocytes Relative: 14 % — ABNORMAL HIGH (ref 3–12)
Neutrophils Relative %: 49 % (ref 43–77)
Platelets: 179 10*3/uL (ref 150–400)
RBC: 4.02 MIL/uL — ABNORMAL LOW (ref 4.22–5.81)
RDW: 14.3 % (ref 11.5–15.5)
WBC: 4.1 10*3/uL (ref 4.0–10.5)

## 2012-10-03 LAB — GLUCOSE, CAPILLARY: Glucose-Capillary: 108 mg/dL — ABNORMAL HIGH (ref 70–99)

## 2012-10-03 LAB — BASIC METABOLIC PANEL
BUN: 27 mg/dL — ABNORMAL HIGH (ref 6–23)
Calcium: 10 mg/dL (ref 8.4–10.5)
Glucose, Bld: 70 mg/dL (ref 70–99)
Potassium: 4.5 mEq/L (ref 3.5–5.1)

## 2012-10-03 MED ORDER — RAMIPRIL 5 MG PO CAPS
5.0000 mg | ORAL_CAPSULE | Freq: Every day | ORAL | Status: DC
  Administered 2012-10-03 – 2012-10-05 (×3): 5 mg via ORAL
  Filled 2012-10-03 (×3): qty 1

## 2012-10-03 MED ORDER — ASPIRIN EC 81 MG PO TBEC
81.0000 mg | DELAYED_RELEASE_TABLET | Freq: Every day | ORAL | Status: DC
  Administered 2012-10-03 – 2012-10-05 (×3): 81 mg via ORAL
  Filled 2012-10-03 (×3): qty 1

## 2012-10-03 MED ORDER — ALPRAZOLAM 0.25 MG PO TABS: 0.2500 mg | ORAL_TABLET | Freq: Two times a day (BID) | ORAL | Status: DC | PRN

## 2012-10-03 MED ORDER — WARFARIN SODIUM 2.5 MG PO TABS
2.5000 mg | ORAL_TABLET | Freq: Every day | ORAL | Status: DC
  Administered 2012-10-03 – 2012-10-04 (×2): 2.5 mg via ORAL
  Filled 2012-10-03 (×3): qty 1

## 2012-10-03 MED ORDER — SIMVASTATIN 20 MG PO TABS
20.0000 mg | ORAL_TABLET | Freq: Every evening | ORAL | Status: DC
  Administered 2012-10-03 – 2012-10-04 (×2): 20 mg via ORAL
  Filled 2012-10-03 (×3): qty 1

## 2012-10-03 MED ORDER — FUROSEMIDE 20 MG PO TABS
20.0000 mg | ORAL_TABLET | ORAL | Status: DC
  Administered 2012-10-04: 20 mg via ORAL
  Filled 2012-10-03: qty 1

## 2012-10-03 MED ORDER — FAMOTIDINE 20 MG PO TABS
20.0000 mg | ORAL_TABLET | Freq: Every day | ORAL | Status: DC
  Administered 2012-10-03 – 2012-10-05 (×3): 20 mg via ORAL
  Filled 2012-10-03 (×3): qty 1

## 2012-10-03 MED ORDER — METFORMIN HCL 500 MG PO TABS: 500.0000 mg | ORAL_TABLET | Freq: Every day | ORAL | Status: DC

## 2012-10-03 MED ORDER — NITROGLYCERIN 0.4 MG SL SUBL: 0.4000 mg | SUBLINGUAL_TABLET | SUBLINGUAL | Status: DC | PRN

## 2012-10-03 MED ORDER — ACETAMINOPHEN 325 MG PO TABS: 650.0000 mg | ORAL_TABLET | ORAL | Status: DC | PRN

## 2012-10-03 MED ORDER — WARFARIN - PHYSICIAN DOSING INPATIENT: Freq: Every day | Status: DC

## 2012-10-03 MED ORDER — GLIMEPIRIDE 1 MG PO TABS
1.0000 mg | ORAL_TABLET | Freq: Every day | ORAL | Status: DC
  Administered 2012-10-05: 1 mg via ORAL
  Filled 2012-10-03 (×3): qty 1

## 2012-10-03 MED ORDER — ONDANSETRON HCL 4 MG/2ML IJ SOLN: 4.0000 mg | Freq: Four times a day (QID) | INTRAMUSCULAR | Status: DC | PRN

## 2012-10-03 NOTE — H&P (Addendum)
Admit date: 10/03/2012 Referring Physician [facility] ER Primary Cardiologist Eldridge Dace Chief complaint/reason for admission: Bradycardia  HPI: 79 year old man with a history of aortic valve replacement and single-vessel bypass several years ago.  I saw him in the office about a week ago.  When we reviewed his home blood pressure and pulse readings, we noted that there were multiple heart rates in the 40s.  Weight is asked about symptoms, he did report some fatigue, dizziness and decreased exercise tolerance.  He has stopped walking for exercise due to a generalized weakness.  He does not report any chest pain.  He has chronic lower extremity edema.  We decided to put a heart monitor on him to see if he had any significant pauses or significant daytime bradycardia which may explain his dizziness.  He put the monitor on last night and within a few hours was called by the monitoring service to say that his heart rate was in the 30s and that he should go to the emergency room.  At the time, the patient was asleep and was asymptomatic.  Since arriving to the emergency room, his heart rate has remained in the 30s to 40s range even while awake.  He is not on any rate slowing drugs.  He is not having any lightheadedness while lying in the bed.  Of note, despite his age, he is still quite active.  He does a lot of yard work including mowing his lawn and doing some gardening.  He is quite independent.    PMH:    Past Medical History  Diagnosis Date  . PVD (peripheral vascular disease)   . Atrial fibrillation   . DVT (deep venous thrombosis)   . Coronary artery disease   . Aortic stenosis   . Diabetes mellitus without complication   . GERD (gastroesophageal reflux disease)   . BPH (benign prostatic hypertrophy)   . Kidney stones   . Diverticulosis     PSH:    Past Surgical History  Procedure Laterality Date  . Coronary artery bypass graft    . Aortic valve replacement    . Paraesophageal hernia repair       ALLERGIES:   Demerol  Prior to Admit Meds:   (Not in a hospital admission) Family HX:   No family history on file. Social HX:    History   Social History  . Marital Status: Married    Spouse Name: N/A    Number of Children: N/A  . Years of Education: N/A   Occupational History  . Not on file.   Social History Main Topics  . Smoking status: Never Smoker   . Smokeless tobacco: Not on file  . Alcohol Use: No  . Drug Use: No  . Sexually Active: Not on file   Other Topics Concern  . Not on file   Social History Narrative  . No narrative on file     ROS:  All 11 ROS were addressed and are negative except what is stated in the HPI  PHYSICAL EXAM Filed Vitals:   10/03/12 0731  BP: 153/63  Pulse: 37  Temp:   Resp: 16   General: Well developed, well nourished, in no acute distress Head:   Normal cephalic and atramatic  Lungs:   Clear bilaterally to auscultation and percussion. Heart:  Bradycardic, irregularly irregular heart rhythm Abdomen:  abdomen soft and non-tender   Extremities:  1+ bilateral lower extremity edema which is pitting Neuro: Alert and oriented X 3. Psych:  Normal affect, responds appropriately   Labs:   Lab Results  Component Value Date   WBC 4.1 10/03/2012   HGB 12.6* 10/03/2012   HCT 38.5* 10/03/2012   MCV 95.8 10/03/2012   PLT 179 10/03/2012    Recent Labs Lab 10/03/12 0430  NA 144  K 4.5  CL 107  CO2 28  BUN 27*  CREATININE 1.45*  CALCIUM 10.0  GLUCOSE 70   Lab Results  Component Value Date   TROPONINI <0.30 10/03/2012   No results found for this basename: PTT   Lab Results  Component Value Date   INR 1.99* 10/03/2012     No results found for this basename: CHOL   No results found for this basename: HDL   No results found for this basename: LDLCALC   No results found for this basename: TRIG   No results found for this basename: CHOLHDL   No results found for this basename: LDLDIRECT      Radiology:   @[id number] @  EKG:  AFib, slow ventricular response  ASSESSMENT: Atrial fibrillation, question of symptomatic bradycardia  PLAN:  Will observe on telemetry.  Will obtain an electrophysiology consult.  We'll have to determine whether pacemaker would be indicated at this time.  Will keep him n.p.o. for now just in case pacemaker is possible for later today.  Blood pressure stable.  Due to the bradycardia, I instructed him to decrease his Remeron pro-to hopefully avoid any possible hypotension in the setting of bradycardia.  He was told that he cannot break that tablet so he has been taking the original dose.  His blood pressure has been stable while in the hospital.  Coumadin for stroke prevention in the setting of atrial fibrillation.  He is just barely below therapeutic.  Continue Coumadin.  Coronary artery disease: He had single-vessel bypass.  No anginal symptoms.    AVR: He was scheduled to have an outpatient echo.  We will perform this while he is in the hospital.  Corky Crafts., MD  10/03/2012  8:12 AM

## 2012-10-03 NOTE — ED Provider Notes (Signed)
CSN: [id number]     Arrival date & time 10/03/12  0316 History     First MD Initiated Contact with Patient 10/03/12 0359     Chief Complaint  Patient presents with  . Bradycardia    HPI Pt was seen at 0410. Per pt and his wife, c/o gradual onset and worsening of persistent bradycardia for the past several weeks. Pt's wife states pt has been wearing a portable heart monitor because "his heart rate was in the 40's." They received a call at midnight, 0100 and 0200 PTA. Pt's wife states she answered the phone at 0200 and was told by "a heart doctor that was watching his monitor" to "go to the ER right away." Pt's wife states it is likely because his "heart rate has been in the 30's now."  Pt himself denies any CP/palpitations, no SOB/cough, no abd pain, no N/V/D, no back pain, no syncope.    Past Medical History  Diagnosis Date  . PVD (peripheral vascular disease)   . Atrial fibrillation   . DVT (deep venous thrombosis)   . Coronary artery disease   . Aortic stenosis   . Diabetes mellitus without complication   . GERD (gastroesophageal reflux disease)   . BPH (benign prostatic hypertrophy)   . Kidney stones   . Diverticulosis    Past Surgical History  Procedure Laterality Date  . Coronary artery bypass graft    . Aortic valve replacement    . Paraesophageal hernia repair      History  Substance Use Topics  . Smoking status: Never Smoker   . Smokeless tobacco: Not on file  . Alcohol Use: No    Review of Systems ROS: Statement: All systems negative except as marked or noted in the HPI; Constitutional: Negative for fever and chills. ; ; Eyes: Negative for eye pain, redness and discharge. ; ; ENMT: Negative for ear pain, hoarseness, nasal congestion, sinus pressure and sore throat. ; ; Cardiovascular: Negative for chest pain, palpitations, diaphoresis, dyspnea and peripheral edema. ; ; Respiratory: Negative for cough, wheezing and stridor. ; ; Gastrointestinal: Negative for nausea,  vomiting, diarrhea, abdominal pain, blood in stool, hematemesis, jaundice and rectal bleeding. . ; ; Genitourinary: Negative for dysuria, flank pain and hematuria. ; ; Musculoskeletal: Negative for back pain and neck pain. Negative for swelling and trauma.; ; Skin: Negative for pruritus, rash, abrasions, blisters, bruising and skin lesion.; ; Neuro: Negative for headache, lightheadedness and neck stiffness. Negative for weakness, altered level of consciousness , altered mental status, extremity weakness, paresthesias, involuntary movement, seizure and syncope.       Allergies  Demerol  Home Medications   Current Outpatient Rx  Name  Route  Sig  Dispense  Refill  . aspirin EC 81 MG tablet   Oral   Take 81 mg by mouth daily.         . furosemide (LASIX) 20 MG tablet   Oral   Take 20 mg by mouth every other day.         [name] glimepiride (AMARYL) 1 MG tablet   Oral   Take 1 mg by mouth daily before breakfast.         . metFORMIN (GLUCOPHAGE) 500 MG tablet   Oral   Take 500 mg by mouth daily with breakfast.         . ramipril (ALTACE) 5 MG capsule   Oral   Take 5 mg by mouth daily.         [name]  ranitidine (ZANTAC) 150 MG tablet   Oral   Take 150 mg by mouth 2 (two) times daily.         . simvastatin (ZOCOR) 40 MG tablet   Oral   Take 20 mg by mouth every evening.         . warfarin (COUMADIN) 2.5 MG tablet   Oral   Take 2.5 mg by mouth daily.          BP 136/66  Pulse 39  Temp(Src) 98.1 F (36.7 C) (Oral)  Resp 20  SpO2 98% Physical Exam 0415: Physical examination:  Nursing notes reviewed; Vital signs and O2 SAT reviewed;  Constitutional: Well developed, Well nourished, Well hydrated, In no acute distress; Head:  Normocephalic, atraumatic; Eyes: EOMI, PERRL, No scleral icterus; ENMT: Mouth and pharynx normal, Mucous membranes moist; Neck: Supple, Full range of motion, No lymphadenopathy; Cardiovascular: Bradycardic rate and irregular rhythm, No gallop;  Respiratory: Breath sounds clear & equal bilaterally, No rales, rhonchi, wheezes.  Speaking full sentences with ease, Normal respiratory effort/excursion; Chest: Nontender, Movement normal; Abdomen: Soft, Nontender, Nondistended, Normal bowel sounds; Genitourinary: No CVA tenderness; Extremities: Pulses normal, No tenderness, No edema, No calf edema or asymmetry.; Neuro: AA&Ox3, Major CN grossly intact.  Speech clear. No gross focal motor or sensory deficits in extremities.; Skin: Color normal, Warm, Dry.   ED Course   Procedures    MDM  MDM Reviewed: previous chart, nursing note and vitals Reviewed previous: labs Interpretation: labs, ECG and x-ray    Date: 10/03/2012  Rate: 44  Rhythm: atrial fibrillation with slow ventricular response  QRS Axis: normal  Intervals: normal  ST/T Wave abnormalities: normal  Conduction Disutrbances:none  Narrative Interpretation:   Old EKG Reviewed: none available  Results for orders placed during the hospital encounter of 10/03/12  BASIC METABOLIC PANEL      Result Value Range   Sodium 144  135 - 145 mEq/L   Potassium 4.5  3.5 - 5.1 mEq/L   Chloride 107  96 - 112 mEq/L   CO2 28  19 - 32 mEq/L   Glucose, Bld 70  70 - 99 mg/dL   BUN 27 (*) 6 - 23 mg/dL   Creatinine, Ser 1.61 (*) 0.50 - 1.35 mg/dL   Calcium 09.6  8.4 - 04.5 mg/dL   GFR calc non Af Amer 43 (*) >90 mL/min   GFR calc Af Amer 49 (*) >90 mL/min  CBC WITH DIFFERENTIAL      Result Value Range   WBC 4.1  4.0 - 10.5 K/uL   RBC 4.02 (*) 4.22 - 5.81 MIL/uL   Hemoglobin 12.6 (*) 13.0 - 17.0 g/dL   HCT 40.9 (*) 81.1 - 91.4 %   MCV 95.8  78.0 - 100.0 fL   MCH 31.3  26.0 - 34.0 pg   MCHC 32.7  30.0 - 36.0 g/dL   RDW 78.2  95.6 - 21.3 %   Platelets 179  150 - 400 K/uL   Neutrophils Relative % 49  43 - 77 %   Neutro Abs 2.0  1.7 - 7.7 K/uL   Lymphocytes Relative 32  12 - 46 %   Lymphs Abs 1.3  0.7 - 4.0 K/uL   Monocytes Relative 14 (*) 3 - 12 %   Monocytes Absolute 0.6  0.1 - 1.0  K/uL   Eosinophils Relative 3  0 - 5 %   Eosinophils Absolute 0.1  0.0 - 0.7 K/uL   Basophils Relative 2 (*) 0 - 1 %  Basophils Absolute 0.1  0.0 - 0.1 K/uL  MAGNESIUM      Result Value Range   Magnesium 2.2  1.5 - 2.5 mg/dL  PROTIME-INR      Result Value Range   Prothrombin Time 22.0 (*) 11.6 - 15.2 seconds   INR 1.99 (*) 0.00 - 1.49  TROPONIN I      Result Value Range   Troponin I <0.30  <0.30 ng/mL   Dg Chest Port 1 View  10/03/2012   *RADIOLOGY REPORT*  Clinical Data: Bradycardia.  Atrial fibrillation.  PORTABLE CHEST - 1 VIEW  Comparison: PA and lateral chest 07/12/2005.  Findings: There is marked cardiomegaly.  Marked elevation of the left hemidiaphragm is again seen.  There is left basilar atelectasis.  No pulmonary edema.  IMPRESSION:  1.  Marked cardiomegaly without edema. 2.  Elevation of the left hemidiaphragm with left basilar atelectasis.   Original Report Authenticated By: Holley Dexter, M.D.    [phone]  Pt continues to have HR in 30's while in the ED. Denies any complaints while sitting on stretcher. VSS otherwise. Dx and testing d/w pt and family.  Questions answered.  Verb understanding, agreeable to admit. T/C to [facility] Dr. Mayford Knife, case discussed, including:  HPI, pertinent PM/SHx, VS/PE, dx testing, ED course and treatment: requests to page the on-call doctor after 0700 to admit.   [id number]:  T/C back from Dr. Mayford Knife: she has gotten in touch with Dr. Eldridge Dace who will come to the ED to eval pt/admit.        Laray Anger, DO 10/03/12 [phone]

## 2012-10-03 NOTE — ED Notes (Signed)
PT. ADVISED BY HIS CARDIOLOGIST TO GO TO ER DUE TO BRADYCARDIA , PT. HAS A PORTABLE HEART MONITOR , DENIES CHEST PAIN OR DISCOMFORT AT ARRIVAL , RESPIRATIONS UNLABORED .

## 2012-10-03 NOTE — Progress Notes (Signed)
PHARMACIST - PHYSICIAN COMMUNICATION DR: Eldridge Dace CONCERNING:  METFORMIN SAFE ADMINISTRATION POLICY  RECOMMENDATION: Metformin has been placed on DISCONTINUE (rejected order) STATUS and should be reordered only after any of the conditions below are ruled out.  Current safety recommendations include avoiding metformin for a minimum of 48 hours after the patient's exposure to intravenous contrast media.  DESCRIPTION:  The Pharmacy Committee has adopted a policy that restricts the use of metformin in hospitalized patients until all the contraindications to administration have been ruled out. Specific contraindications are: [x]  Serum creatinine ? 1.5 for males []  Serum creatinine ? 1.4 for females []  Shock, acute MI, sepsis, hypoxemia, dehydration []  Planned administration of intravenous iodinated contrast media []  Heart Failure patients with low EF []  Acute or chronic metabolic acidosis (including DKA)     Link Snuffer, PharmD, BCPS Clinical Pharmacist [phone] 10/03/2012, 9:35 AM

## 2012-10-03 NOTE — Consult Note (Signed)
 ELECTROPHYSIOLOGY CONSULT NOTE  Patient ID: [name] MRN: [id number], DOB/AGE: 04/27/1925   Admit date: 10/03/2012 Date of Consult: 10/03/2012  Primary Cardiologist: Varanasi, MD Reason for Consultation: Bradycardia  History of Present Illness [name] is a very pleasant 79 y.o. male with AS s/p AVR, CAD s/p CABG, permanent AFib, PVD and DM who has been admitted with bradycardia. Over the last 4 weeks, [name] has recorded low heart rates, in the 40s, using his home BP cuff. 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 Dr. Varanasi ordered a Holter monitor which was placed yesterday. Within a few hours he was called by the monitoring service to say that his heart rate was in the 30s. He was instructed to go to the emergency room. [name] reports when the monitoring service contacted him he was asleep and asymptomatic. However, since arriving to the ED, his heart rate remains 30-40 bpm while awake. He is asymptomatic while lying in bed. He is not on any rate slowing medications. His echo is pending. EP has been asked to see in consultation for recommendations regarding permanent pacemaker. 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   Past Medical History Past Medical History  Diagnosis Date  . PVD (peripheral vascular disease)   . Atrial fibrillation   . DVT (deep venous thrombosis)   . Coronary artery disease   . Aortic stenosis   . Diabetes mellitus without complication   . GERD (gastroesophageal reflux disease)   . BPH (benign prostatic hypertrophy)   . Kidney stones   . Diverticulosis     Past Surgical History Past Surgical History  Procedure Laterality Date  . Coronary artery bypass graft    . Aortic valve replacement    . Paraesophageal hernia repair      Allergies/Intolerances Allergies    Allergen Reactions  . Demerol (Meperidine) Other (See Comments)    "hallucinations"   Current Home Medications   Medication List    ASK your doctor about these medications       aspirin EC 81 MG tablet  Take 81 mg by mouth daily.     furosemide 20 MG tablet  Commonly known as:  LASIX  Take 20 mg by mouth every other day.     glimepiride 1 MG tablet  Commonly known as:  AMARYL  Take 1 mg by mouth daily before breakfast.     metFORMIN 500 MG tablet  Commonly known as:  GLUCOPHAGE  Take 500 mg by mouth daily with breakfast.     ramipril 5 MG capsule  Commonly known as:  ALTACE  Take 5 mg by mouth daily.     ranitidine 150 MG tablet  Commonly known as:  ZANTAC  Take 150 mg by mouth 2 (two) times daily.     simvastatin 40 MG tablet  Commonly known as:  ZOCOR  Take 20 mg by mouth every evening.     warfarin 2.5 MG tablet  Commonly known as:  COUMADIN  Take 2.5 mg by mouth daily.       Family History Positive for CAD   Social History Social History  . Marital Status: Married   Social History Main Topics  . Smoking status: Never Smoker   . Smokeless tobacco: Not on file  . Alcohol Use: No  . Drug Use: No     Review of Systems General: No chills, fever, night sweats or weight changes  Cardiovascular: No chest pain, edema, orthopnea, palpitations, paroxysmal nocturnal dyspnea Dermatological: No rash, lesions or masses Respiratory: No cough Urologic: No hematuria, dysuria Abdominal: No nausea, vomiting, diarrhea, bright red blood per rectum, melena, or hematemesis Neurologic: No visual changes, changes in mental status All other systems reviewed and are otherwise negative except as noted above.  Physical Exam Vitals: Blood pressure 153/63, pulse 37, temperature 98.1 F (36.7 C), temperature source Oral, resp. rate 16, SpO2 99.00%.  General: Well developed, well appearing 79 y.o. male in no acute distress. HEENT: Normocephalic, atraumatic. EOMs intact. Sclera  nonicteric. Oropharynx clear.  Neck: Supple without bruits. No JVD. Lungs: Respirations regular and unlabored, CTA bilaterally. No wheezes, rales or rhonchi. Heart: Irregular. S1, S2 present. Soft II/VI systolic murmur. No rub, S3 or S4. Abdomen: Soft, non-tender, non-distended. BS present x 4 quadrants. No hepatosplenomegaly.  Extremities: No clubbing or cyanosis. 1+ pedal edema bilaterally. DP/PT/Radials 2+ and equal bilaterally. Psych: Normal affect. Neuro: Alert and oriented X 3. Moves all extremities spontaneously. Musculoskeletal: No kyphosis. Skin: Intact. Warm and dry. No rashes or petechiae in exposed areas.   Labs  Recent Labs  10/03/12 0416  TROPONINI <0.30   Lab Results  Component Value Date   WBC 4.1 10/03/2012   HGB 12.6* 10/03/2012   HCT 38.5* 10/03/2012   MCV 95.8 10/03/2012   PLT 179 10/03/2012    Recent Labs Lab 10/03/12 0430  NA 144  K 4.5  CL 107  CO2 28  BUN 27*  CREATININE 1.45*  CALCIUM 10.0  GLUCOSE 70    Recent Labs  10/03/12 0430  INR 1.99*    Radiology/Studies Dg Chest Port 1 View 10/03/2012   *RADIOLOGY REPORT*  Clinical Data: Bradycardia.  Atrial fibrillation.  PORTABLE CHEST - 1 VIEW  Comparison: PA and lateral chest 07/12/2005.  Findings: There is marked cardiomegaly.  Marked elevation of the left hemidiaphragm is again seen.  There is left basilar atelectasis.  No pulmonary edema.  IMPRESSION:  1.  Marked cardiomegaly without edema. 2.  Elevation of the left hemidiaphragm with left basilar atelectasis. Original Report Authenticated By: Thomas D'Alessio, M.D.    Echocardiogram  Pending  12-lead ECG in ED shows AF with slow ventricular response at 44 bpm  Telemetry - at rest - AF with rates in upper 30s Telemetry - with walking in hallway - AF in 60s, increased appropriately with peak at 74 bpm, no symptoms during ambulation   Assessment and Plan 1. Bradycardia  2. Permanent atrial fibrillation 3. AS s/p AVR 4. CAD s/p CABG  [name]  presents with slow AFib. He has chronic AFib and has noticed decreased rate (in the 40s) x 1 month. He has noticed exertional dizziness and SOB that is limiting his exercise tolerance some; however, during ambulation today his heart rate increased appropriately with exertion. He had no symptoms. His rate at rest is in the upper 30s-40s and he is asymptomatic. He is not on any rate slowing medications. His echo is pending. Will be helpful to ensure AVR functioning normally. We recommend he continue to wear the LifeWatch monitor for now. Also, with his known CAD, question if DOE could be anginal equivalent but will defer management of CAD +/- ischemic work-up to his primary cardiologist, Dr. Varanasi.   Dr. Taylor to see. Please see recommendations below. Signed, EDMISTEN, BROOKE, PA-C  EP Attending  Patient seen and examined. Agree with above history, physical exam,   assessment and plan. [name] appears to have symptomatic bradycardia from his atrial fibrillation. It is always hard to know how much improvement would be had by PPM insertion but in his case I think it is reasonable and would recommend proceeding. He is not a candidate for a leadless ppm because his PA pressures are elevated.  Will shoot for PPM tomorrow.  Gregg Taylor, M.D. 10/03/2012, 8:37 AM    

## 2012-10-03 NOTE — Progress Notes (Signed)
Utilization review completed.  

## 2012-10-03 NOTE — Progress Notes (Signed)
  Echocardiogram 2D Echocardiogram has been performed.  Georgian Co 10/03/2012, 2:41 PM

## 2012-10-03 NOTE — ED Notes (Signed)
Eagle MD at bedside.

## 2012-10-04 ENCOUNTER — Encounter (HOSPITAL_COMMUNITY)
Admission: EM | Disposition: A | Discharge status: Home / Self Care | Payer: Self-pay | Source: Home / Self Care | Attending: Emergency Medicine

## 2012-10-04 DIAGNOSIS — I498 Other specified cardiac arrhythmias: Secondary | ICD-10-CM | POA: Diagnosis not present

## 2012-10-04 DIAGNOSIS — R001 Bradycardia, unspecified: Secondary | ICD-10-CM | POA: Diagnosis present

## 2012-10-04 HISTORY — PX: PACEMAKER INSERTION: SHX728

## 2012-10-04 HISTORY — PX: PERMANENT PACEMAKER INSERTION: SHX5480

## 2012-10-04 LAB — PROTIME-INR
INR: 2.15 — ABNORMAL HIGH (ref 0.00–1.49)
Prothrombin Time: 23.3 seconds — ABNORMAL HIGH (ref 11.6–15.2)

## 2012-10-04 LAB — BASIC METABOLIC PANEL
CO2: 26 mEq/L (ref 19–32)
Calcium: 9.4 mg/dL (ref 8.4–10.5)
Creatinine, Ser: 1.32 mg/dL (ref 0.50–1.35)
GFR calc non Af Amer: 48 mL/min — ABNORMAL LOW (ref >90)
Glucose, Bld: 105 mg/dL — ABNORMAL HIGH (ref 70–99)
Sodium: 141 mEq/L (ref 135–145)

## 2012-10-04 LAB — GLUCOSE, CAPILLARY
Glucose-Capillary: 100 mg/dL — ABNORMAL HIGH (ref 70–99)
Glucose-Capillary: 94 mg/dL (ref 70–99)

## 2012-10-04 SURGERY — PERMANENT PACEMAKER INSERTION
Anesthesia: LOCAL

## 2012-10-04 MED ORDER — SODIUM CHLORIDE 0.9 % IV SOLN
INTRAVENOUS | Status: DC
  Administered 2012-10-04: 11:00:00 via INTRAVENOUS

## 2012-10-04 MED ORDER — SODIUM CHLORIDE 0.9 % IV SOLN: 250.0000 mL | INTRAVENOUS | Status: DC | PRN

## 2012-10-04 MED ORDER — HYDROCODONE-ACETAMINOPHEN 5-325 MG PO TABS: 1.0000 | ORAL_TABLET | ORAL | Status: DC | PRN

## 2012-10-04 MED ORDER — SODIUM CHLORIDE 0.9 % IR SOLN
80.0000 mg | Status: AC
  Administered 2012-10-04: 80 mg
  Filled 2012-10-04: qty 2

## 2012-10-04 MED ORDER — SODIUM CHLORIDE 0.9 % IJ SOLN
3.0000 mL | Freq: Two times a day (BID) | INTRAMUSCULAR | Status: DC
  Administered 2012-10-04 – 2012-10-05 (×2): 3 mL via INTRAVENOUS

## 2012-10-04 MED ORDER — ACETAMINOPHEN 325 MG PO TABS: 325.0000 mg | ORAL_TABLET | ORAL | Status: DC | PRN

## 2012-10-04 MED ORDER — LIDOCAINE HCL (PF) 1 % IJ SOLN
INTRAMUSCULAR | Status: AC
  Filled 2012-10-04: qty 60

## 2012-10-04 MED ORDER — CEFAZOLIN SODIUM 1-5 GM-% IV SOLN
1.0000 g | Freq: Four times a day (QID) | INTRAVENOUS | Status: DC
  Administered 2012-10-04 – 2012-10-05 (×2): 1 g via INTRAVENOUS
  Filled 2012-10-04 (×5): qty 50

## 2012-10-04 MED ORDER — SODIUM CHLORIDE 0.9 % IJ SOLN: 3.0000 mL | INTRAMUSCULAR | Status: DC | PRN

## 2012-10-04 MED ORDER — CHLORHEXIDINE GLUCONATE 4 % EX LIQD
60.0000 mL | Freq: Once | CUTANEOUS | Status: AC
  Administered 2012-10-04: 4 via TOPICAL

## 2012-10-04 MED ORDER — YOU HAVE A PACEMAKER BOOK
Freq: Once | Status: AC
  Administered 2012-10-04: 23:00:00
  Filled 2012-10-04: qty 1

## 2012-10-04 MED ORDER — CHLORHEXIDINE GLUCONATE 4 % EX LIQD
60.0000 mL | Freq: Once | CUTANEOUS | Status: AC
  Administered 2012-10-04: 4 via TOPICAL
  Filled 2012-10-04: qty 60

## 2012-10-04 MED ORDER — CEFAZOLIN SODIUM-DEXTROSE 2-3 GM-% IV SOLR
2.0000 g | INTRAVENOUS | Status: AC
  Administered 2012-10-04: 2 g via INTRAVENOUS
  Filled 2012-10-04: qty 50

## 2012-10-04 MED ORDER — ONDANSETRON HCL 4 MG/2ML IJ SOLN: 4.0000 mg | Freq: Four times a day (QID) | INTRAMUSCULAR | Status: DC | PRN

## 2012-10-04 NOTE — H&P (View-Only) (Signed)
ELECTROPHYSIOLOGY CONSULT NOTE  Patient ID: [name] MRN: [id number], DOB/AGE: May 22, 1925   Admit date: 10/03/2012 Date of Consult: 10/03/2012  Primary Cardiologist: Eldridge Dace, MD Reason for Consultation: Bradycardia  History of Present Illness [name] is a very pleasant 79 y.o. male with AS s/p AVR, CAD s/p CABG, permanent AFib, PVD and DM who has been admitted with bradycardia. Over the last 4 weeks, [name] has recorded low heart rates, in the 40s, using his home BP cuff. 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 Dr. Eldridge Dace ordered a Holter monitor which was placed yesterday. Within a few hours he was called by the monitoring service to say that his heart rate was in the 30s. He was instructed to go to the emergency room. [name] reports when the monitoring service contacted him he was asleep and asymptomatic. However, since arriving to the ED, his heart rate remains 30-40 bpm while awake. He is asymptomatic while lying in bed. He is not on any rate slowing medications. His echo is pending. EP has been asked to see in consultation for recommendations regarding permanent pacemaker. 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   Past Medical History Past Medical History  Diagnosis Date  . PVD (peripheral vascular disease)   . Atrial fibrillation   . DVT (deep venous thrombosis)   . Coronary artery disease   . Aortic stenosis   . Diabetes mellitus without complication   . GERD (gastroesophageal reflux disease)   . BPH (benign prostatic hypertrophy)   . Kidney stones   . Diverticulosis     Past Surgical History Past Surgical History  Procedure Laterality Date  . Coronary artery bypass graft    . Aortic valve replacement    . Paraesophageal hernia repair      Allergies/Intolerances Allergies    Allergen Reactions  . Demerol (Meperidine) Other (See Comments)    "hallucinations"   Current Home Medications   Medication List    ASK your doctor about these medications       aspirin EC 81 MG tablet  Take 81 mg by mouth daily.     furosemide 20 MG tablet  Commonly known as:  LASIX  Take 20 mg by mouth every other day.     glimepiride 1 MG tablet  Commonly known as:  AMARYL  Take 1 mg by mouth daily before breakfast.     metFORMIN 500 MG tablet  Commonly known as:  GLUCOPHAGE  Take 500 mg by mouth daily with breakfast.     ramipril 5 MG capsule  Commonly known as:  ALTACE  Take 5 mg by mouth daily.     ranitidine 150 MG tablet  Commonly known as:  ZANTAC  Take 150 mg by mouth 2 (two) times daily.     simvastatin 40 MG tablet  Commonly known as:  ZOCOR  Take 20 mg by mouth every evening.     warfarin 2.5 MG tablet  Commonly known as:  COUMADIN  Take 2.5 mg by mouth daily.       Family History Positive for CAD   Social History Social History  . Marital Status: Married   Social History Main Topics  . Smoking status: Never Smoker   . Smokeless tobacco: Not on file  . Alcohol Use: No  . Drug Use: No  Review of Systems General: No chills, fever, night sweats or weight changes  Cardiovascular: No chest pain, edema, orthopnea, palpitations, paroxysmal nocturnal dyspnea Dermatological: No rash, lesions or masses Respiratory: No cough Urologic: No hematuria, dysuria Abdominal: No nausea, vomiting, diarrhea, bright red blood per rectum, melena, or hematemesis Neurologic: No visual changes, changes in mental status All other systems reviewed and are otherwise negative except as noted above.  Physical Exam Vitals: Blood pressure 153/63, pulse 37, temperature 98.1 F (36.7 C), temperature source Oral, resp. rate 16, SpO2 99.00%.  General: Well developed, well appearing 79 y.o. male in no acute distress. HEENT: Normocephalic, atraumatic. EOMs intact. Sclera  nonicteric. Oropharynx clear.  Neck: Supple without bruits. No JVD. Lungs: Respirations regular and unlabored, CTA bilaterally. No wheezes, rales or rhonchi. Heart: Irregular. S1, S2 present. Soft II/VI systolic murmur. No rub, S3 or S4. Abdomen: Soft, non-tender, non-distended. BS present x 4 quadrants. No hepatosplenomegaly.  Extremities: No clubbing or cyanosis. 1+ pedal edema bilaterally. DP/PT/Radials 2+ and equal bilaterally. Psych: Normal affect. Neuro: Alert and oriented X 3. Moves all extremities spontaneously. Musculoskeletal: No kyphosis. Skin: Intact. Warm and dry. No rashes or petechiae in exposed areas.   Labs  Recent Labs  10/03/12 0416  TROPONINI <0.30   Lab Results  Component Value Date   WBC 4.1 10/03/2012   HGB 12.6* 10/03/2012   HCT 38.5* 10/03/2012   MCV 95.8 10/03/2012   PLT 179 10/03/2012    Recent Labs Lab 10/03/12 0430  NA 144  K 4.5  CL 107  CO2 28  BUN 27*  CREATININE 1.45*  CALCIUM 10.0  GLUCOSE 70    Recent Labs  10/03/12 0430  INR 1.99*    Radiology/Studies Dg Chest Port 1 View 10/03/2012   *RADIOLOGY REPORT*  Clinical Data: Bradycardia.  Atrial fibrillation.  PORTABLE CHEST - 1 VIEW  Comparison: PA and lateral chest 07/12/2005.  Findings: There is marked cardiomegaly.  Marked elevation of the left hemidiaphragm is again seen.  There is left basilar atelectasis.  No pulmonary edema.  IMPRESSION:  1.  Marked cardiomegaly without edema. 2.  Elevation of the left hemidiaphragm with left basilar atelectasis. Original Report Authenticated By: Holley Dexter, M.D.    Echocardiogram  Pending  12-lead ECG in ED shows AF with slow ventricular response at 44 bpm  Telemetry - at rest - AF with rates in upper 30s Telemetry - with walking in hallway - AF in 60s, increased appropriately with peak at 74 bpm, no symptoms during ambulation   Assessment and Plan 1. Bradycardia  2. Permanent atrial fibrillation 3. AS s/p AVR 4. CAD s/p CABG  Mr. [name]  presents with slow AFib. He has chronic AFib and has noticed decreased rate (in the 40s) x 1 month. He has noticed exertional dizziness and SOB that is limiting his exercise tolerance some; however, during ambulation today his heart rate increased appropriately with exertion. He had no symptoms. His rate at rest is in the upper 30s-40s and he is asymptomatic. He is not on any rate slowing medications. His echo is pending. Will be helpful to ensure AVR functioning normally. We recommend he continue to wear the LifeWatch monitor for now. Also, with his known CAD, question if DOE could be anginal equivalent but will defer management of CAD +/- ischemic work-up to his primary cardiologist, Dr. Eldridge Dace.   Dr. Ladona Ridgel to see. Please see recommendations below. Signed, Rick Duff PA-C  EP Attending  Patient seen and examined. Agree with above history, physical exam,  assessment and plan. Mr. Linck appears to have symptomatic bradycardia from his atrial fibrillation. It is always hard to know how much improvement would be had by PPM insertion but in his case I think it is reasonable and would recommend proceeding. He is not a candidate for a leadless ppm because his PA pressures are elevated.  Will shoot for PPM tomorrow.  Lewayne Bunting, M.D. 10/03/2012, 8:37 AM

## 2012-10-04 NOTE — Interval H&P Note (Signed)
History and Physical Interval Note:  10/04/2012 1:01 PM  [name]  has presented today for surgery, with the diagnosis of bradicardia  The various methods of treatment have been discussed with the patient and family. After consideration of risks, benefits and other options for treatment, the patient has consented to  Procedure(s): PERMANENT PACEMAKER INSERTION (N/A) as a surgical intervention .  The patient's history has been reviewed, patient examined, no change in status, stable for surgery.  I have reviewed the patient's chart and labs.  Questions were answered to the patient's satisfaction.    The patient has symptomatic bradycardia.  No reversible causes have been found. I would therefore recommend pacemaker implantation at this time.  Risks, benefits, alternatives to pacemaker implantation were discussed in detail with the patient today. The patient understands that the risks include but are not limited to bleeding, infection, pneumothorax, perforation, tamponade, vascular damage, renal failure, MI, stroke, death,  and lead dislodgement and wishes to proceed. We will therefore schedule the procedure at the next available time.     Hillis Range

## 2012-10-04 NOTE — Progress Notes (Addendum)
The patient has no complaints. He denies dyspnea. He is awaiting pacemaker later today by Dr. Ladona Ridgel or Allred. No changes in therapy. He will remain n.p.o.  ECHOCARDIOGRAM 10/03/2012 ------------------------------------------------------------ Study Conclusions  - Left ventricle: The cavity size was normal. Wall thickness was increased in a pattern of mild LVH. Systolic function was normal. The estimated ejection fraction was in the range of 60% to 65%. Indeterminant diastolic function (atrial fibrillation). Although no diagnostic regional wall motion abnormality was identified, this possibility cannot be completely excluded on the basis of this study. - Aortic valve: Bioprosthetic aortic valve. No bioprosthetic valve stenosis. No regurgitation. Mean gradient: 10mm Hg (S). - Mitral valve: Moderately calcified annulus. Mildly calcified leaflets . There was no evidence for stenosis. Mild regurgitation. - Left atrium: The atrium was moderately to severely dilated. - Right ventricle: The cavity size was normal. Systolic function was normal. - Right atrium: The atrium was moderately dilated. - Tricuspid valve: Peak RV-RA gradient:55mm Hg (S). - Pulmonary arteries: PA peak pressure: 63mm Hg (S). - Systemic veins: IVC was not visualized. Impressions:  - The patient was in atrial fibrillation. Normal LV size with mild LV hypertrophy, EF 60-65%. Moderate to severe biatrial enlargement. Normal RV size and systolic function. Moderate pulmonary hypertension. Bioprosthetic aortic valve with normal function.

## 2012-10-04 NOTE — Op Note (Signed)
SURGEON:  Hillis Range, MD     PREPROCEDURE DIAGNOSIS:  Symptomatic Bradycardia, permanent atrial fibrillation    POSTPROCEDURE DIAGNOSIS:  Symptomatic Bradycardia, permanent atrial fibrillation     PROCEDURES:   1. Pacemaker implantation.     INTRODUCTION: [name] is a 79 y.o. male  with a history of symptomatic bradycardia who presents today for pacemaker implantation.  The patient reports intermittent episodes of fatigue over the past few months.  No reversible causes have been identified.  The patient therefore presents today for pacemaker implantation.     DESCRIPTION OF PROCEDURE:  Informed written consent was obtained, and the patient was brought to the electrophysiology lab in a fasting state.  The patient required no sedation for the procedure today.  The patients left chest was prepped and draped in the usual sterile fashion by the EP lab staff. The skin overlying the left deltopectoral region was infiltrated with lidocaine for local analgesia.  A 4-cm incision was made over the left deltopectoral region.  A left subcutaneous pacemaker pocket was fashioned using a combination of sharp and blunt dissection. Electrocautery was required to assure hemostasis.    RA/RV Lead Placement: The left axillary vein was cannulated.  No contrast was required for the procedure today. Through the left axillary vein, a Medtronic model  5092- [id number] (serial number [id number]) right ventricular lead was advanced with fluoroscopic visualization into the right ventricular apex position.  Initial right ventricular lead R-waves measured 5 mV with an impedance of 654 ohms and a threshold of 0.2 V at 0.5 msec.  The lead was secured to the pectoralis fascia using #2-0 silk over the suture sleeve.   Device Placement:  The lead was then connected to a Medtronic [place] model SESR0L 1 (serial number [id number]) pacemaker.  The pocket was irrigated with copious gentamicin solution.  The pacemaker was then placed  into the pocket.  The pocket was then closed in 2 layers with 2.0 Vicryl suture for the subcutaneous and subcuticular layers.  Steri- Strips and a sterile dressing were then applied.  There were no early apparent complications.     CONCLUSIONS:   1. Successful implantation of a Medtronic Sensia single-chamber pacemaker for symptomatic bradycardia  2. No early apparent complications.           Hillis Range, MD 10/04/2012 4:39 PM

## 2012-10-05 ENCOUNTER — Observation Stay (HOSPITAL_COMMUNITY): Payer: Medicare Other

## 2012-10-05 DIAGNOSIS — E119 Type 2 diabetes mellitus without complications: Secondary | ICD-10-CM | POA: Diagnosis not present

## 2012-10-05 DIAGNOSIS — I498 Other specified cardiac arrhythmias: Secondary | ICD-10-CM | POA: Diagnosis not present

## 2012-10-05 DIAGNOSIS — I4891 Unspecified atrial fibrillation: Secondary | ICD-10-CM | POA: Diagnosis not present

## 2012-10-05 DIAGNOSIS — J9819 Other pulmonary collapse: Secondary | ICD-10-CM | POA: Diagnosis not present

## 2012-10-05 DIAGNOSIS — Z79899 Other long term (current) drug therapy: Secondary | ICD-10-CM | POA: Diagnosis not present

## 2012-10-05 DIAGNOSIS — Z954 Presence of other heart-valve replacement: Secondary | ICD-10-CM | POA: Diagnosis not present

## 2012-10-05 DIAGNOSIS — I2510 Atherosclerotic heart disease of native coronary artery without angina pectoris: Secondary | ICD-10-CM | POA: Diagnosis not present

## 2012-10-05 LAB — GLUCOSE, CAPILLARY: Glucose-Capillary: 109 mg/dL — ABNORMAL HIGH (ref 70–99)

## 2012-10-05 MED ORDER — NITROGLYCERIN 0.4 MG SL SUBL: 0.4000 mg | SUBLINGUAL_TABLET | SUBLINGUAL | Status: DC | PRN

## 2012-10-05 NOTE — Discharge Summary (Signed)
Patient ID: FINNBAR CEDILLOS MRN: [id number] DOB/AGE: Mar 30, 1925 79 y.o.  Admit date: 10/03/2012 Discharge date: 10/05/2012  Primary Discharge Diagnosis Symptomatic bradycardia Secondary Discharge Diagnosis AFib, edema, chronic coumadin use, CAD  Significant Diagnostic Studies: pacemaker insertion  Consults: Electrophysiology  Hospital Course: 79 year old man with aortic replacement, atrial fibrillation who had been having some lightheadedness and dizziness.  He noticed that his heart rates at home were slow.  The monitor was placed as an outpatient.  Within several hours, he was called by the monitoring company and told to come to the emergency room due to bradycardia.  He was watched on telemetry and electrophysiology consult was obtained.  They decided on permanent pacemaker placement.  He was not a candidate for leave this device due to elevated pulmonary artery pressure.  He tolerated the pacemaker procedure well.  He was checked the day after the device was inserted and deemed ready for discharge.  He was maintained on his home dose of Coumadin in the hospital.  He did have some leg swelling, but this is chronic   Discharge Exam: Blood pressure 134/70, pulse 60, temperature 97.9 F (36.6 C), temperature source Oral, resp. rate 16, height 5\' 10"  (1.778 m), weight 90.855 kg (200 lb 4.8 oz), SpO2 98.00%.   [place]/AT RRR, S1, S2 No wheezing Mild obesity Bilateral, pitting edema at ankles Labs:   Lab Results  Component Value Date   WBC 4.1 10/03/2012   HGB 12.6* 10/03/2012   HCT 38.5* 10/03/2012   MCV 95.8 10/03/2012   PLT 179 10/03/2012    Recent Labs Lab 10/04/12 0350  NA 141  K 4.6  CL 106  CO2 26  BUN 25*  CREATININE 1.32  CALCIUM 9.4  GLUCOSE 105*   Lab Results  Component Value Date   TROPONINI <0.30 10/03/2012    No results found for this basename: CHOL   No results found for this basename: HDL   No results found for this basename: LDLCALC   No results found for this  basename: TRIG   No results found for this basename: CHOLHDL   No results found for this basename: LDLDIRECT      Radiology: pacer, no PTX EKG: prior to pacer, AFib with slow ventricular response  FOLLOW UP PLANS AND APPOINTMENTS  Future Appointments Provider Department Dept Phone   10/15/2012 4:30 PM [facility] Main Office [place]) [phone]       Medication List    STOP taking these medications       aspirin EC 81 MG tablet      TAKE these medications       furosemide 20 MG tablet  Commonly known as:  LASIX  Take 20 mg by mouth every other day.     glimepiride 1 MG tablet  Commonly known as:  AMARYL  Take 1 mg by mouth daily before breakfast.     metFORMIN 500 MG tablet  Commonly known as:  GLUCOPHAGE  Take 500 mg by mouth daily with breakfast.     nitroGLYCERIN 0.4 MG SL tablet  Commonly known as:  NITROSTAT  Place 1 tablet (0.4 mg total) under the tongue every 5 (five) minutes x 3 doses as needed for chest pain.     ramipril 5 MG capsule  Commonly known as:  ALTACE  Take 5 mg by mouth daily.     ranitidine 150 MG tablet  Commonly known as:  ZANTAC  Take 150 mg by mouth 2 (two) times daily.  simvastatin 40 MG tablet  Commonly known as:  ZOCOR  Take 20 mg by mouth every evening.     warfarin 2.5 MG tablet  Commonly known as:  COUMADIN  Take 2.5 mg by mouth daily.           Follow-up Information   Follow up with Corky Crafts., MD. (as scheduled)    Contact information:   [address] [place] [address] [phone]       BRING ALL MEDICATIONS WITH YOU TO FOLLOW UP APPOINTMENTS  Time spent with patient to include physician time: 25 minutes Signed: Lougenia Morrissey S. 10/05/2012, 9:11 AM

## 2012-10-05 NOTE — Progress Notes (Signed)
   SUBJECTIVE: The patient is doing well today.  At this time, he denies chest pain, shortness of breath, or any new concerns.  He is s/p VVI pacemaker implant 10-04-2012 for symptomatic bradycardia.  CURRENT MEDICATIONS: . aspirin EC  81 mg Oral Daily  .  ceFAZolin (ANCEF) IV  1 g Intravenous Q6H  . famotidine  20 mg Oral Daily  . furosemide  20 mg Oral QODAY  . glimepiride  1 mg Oral QAC breakfast  . ramipril  5 mg Oral Daily  . simvastatin  20 mg Oral QPM  . sodium chloride  3 mL Intravenous Q12H  . warfarin  2.5 mg Oral q1800  . Warfarin - Physician Dosing Inpatient   Does not apply q1800      OBJECTIVE: Physical Exam: Filed Vitals:   10/04/12 2000 10/04/12 2200 10/05/12 0200 10/05/12 0558  BP: 128/72 137/71 137/66 151/80  Pulse: 60 86 63 65  Temp: 97.4 F (36.3 C) 97.9 F (36.6 C) 97.6 F (36.4 C) 97.5 F (36.4 C)  TempSrc: Oral Oral Oral Oral  Resp: 18 18 18 18   Height:      Weight:      SpO2: 97% 97% 97% 97%    Intake/Output Summary (Last 24 hours) at 10/05/12 9147 Last data filed at 10/04/12 1926  Gross per 24 hour  Intake  707.5 ml  Output    250 ml  Net  457.5 ml    Telemetry reveals atrial fibrillation with ventricular pacing  GEN- The patient is well appearing, alert and oriented x 3 today.   Head- normocephalic, atraumatic Eyes-  Sclera clear, conjunctiva pink Ears- hearing intact Oropharynx- clear Neck- supple, no JVP Lymph- no cervical lymphadenopathy Lungs- Clear to ausculation bilaterally, normal work of breathing Heart- Regular rate and rhythm, no murmurs, rubs or gallops, PMI not laterally displaced GI- soft, NT, ND, + BS Extremities- no clubbing, cyanosis, or edema Skin- pacemaker pocket is without hematoma  LABS: Basic Metabolic Panel:  Recent Labs  82/95/62 0430 10/04/12 0350  NA 144 141  K 4.5 4.6  CL 107 106  CO2 28 26  GLUCOSE 70 105*  BUN 27* 25*  CREATININE 1.45* 1.32  CALCIUM 10.0 9.4  MG 2.2  --    CBC:  Recent  Labs  10/03/12 0430  WBC 4.1  NEUTROABS 2.0  HGB 12.6*  HCT 38.5*  MCV 95.8  PLT 179   Cardiac Enzymes:  Recent Labs  10/03/12 0416  TROPONINI <0.30    RADIOLOGY: No obvious pneumothorax  Lead in stable position.   ASSESSMENT AND PLAN:  Active Problems:   Atrial fibrillation   Symptomatic bradycardia  1. Symptomatic bradycardia Normal PPM function (interrogation reviewed, see paper chart) Device interrogation reviewed.  Found to be functioning normally.  Wound care, arm mobility, restrictions reviewed with patient. Follow up with [facility] 10-15-12 at 4:30PM.   No further inpatient EP evaluation planned

## 2012-10-06 ENCOUNTER — Encounter (HOSPITAL_COMMUNITY): Payer: Self-pay | Admitting: *Deleted

## 2012-10-15 ENCOUNTER — Ambulatory Visit (INDEPENDENT_AMBULATORY_CARE_PROVIDER_SITE_OTHER): Payer: Medicare Other | Admitting: *Deleted

## 2012-10-15 DIAGNOSIS — Z950 Presence of cardiac pacemaker: Secondary | ICD-10-CM

## 2012-10-15 DIAGNOSIS — I4891 Unspecified atrial fibrillation: Secondary | ICD-10-CM

## 2012-10-15 LAB — PACEMAKER DEVICE OBSERVATION
BMOD-0003RV: 30
BMOD-0005RV: 95 {beats}/min
RV LEAD AMPLITUDE: 8 mv
RV LEAD IMPEDENCE PM: 718 Ohm
RV LEAD THRESHOLD: 0.5 V
VENTRICULAR PACING PM: 99

## 2012-10-15 NOTE — Progress Notes (Signed)
Pt seen in device clinic for follow up of recently implanted pacemaker.  Wound well healed.  No redness, swelling, or edema.  Steri-strips removed today.   Device interrogated and found to be functioning normally.  No changes made today. See PaceArt for full details.  Pt denies chest pain, shortness of breath, palpitations, or dizziness.  Pt to follow up with Dr. Johney Frame 01/04/13 @ 9:30.   Weston Anna 10/15/2012 4:48 PM

## 2012-10-22 DIAGNOSIS — G609 Hereditary and idiopathic neuropathy, unspecified: Secondary | ICD-10-CM | POA: Diagnosis not present

## 2012-10-22 DIAGNOSIS — E782 Mixed hyperlipidemia: Secondary | ICD-10-CM | POA: Diagnosis not present

## 2012-10-22 DIAGNOSIS — I4892 Unspecified atrial flutter: Secondary | ICD-10-CM | POA: Diagnosis not present

## 2012-10-22 DIAGNOSIS — Z7901 Long term (current) use of anticoagulants: Secondary | ICD-10-CM | POA: Diagnosis not present

## 2012-10-22 DIAGNOSIS — Z0000 Encounter for general adult medical examination without abnormal findings: Secondary | ICD-10-CM | POA: Diagnosis not present

## 2012-10-22 DIAGNOSIS — E1149 Type 2 diabetes mellitus with other diabetic neurological complication: Secondary | ICD-10-CM | POA: Diagnosis not present

## 2012-10-22 DIAGNOSIS — I10 Essential (primary) hypertension: Secondary | ICD-10-CM | POA: Diagnosis not present

## 2012-10-22 DIAGNOSIS — Z1331 Encounter for screening for depression: Secondary | ICD-10-CM | POA: Diagnosis not present

## 2012-10-22 DIAGNOSIS — I359 Nonrheumatic aortic valve disorder, unspecified: Secondary | ICD-10-CM | POA: Diagnosis not present

## 2012-10-22 DIAGNOSIS — I4891 Unspecified atrial fibrillation: Secondary | ICD-10-CM | POA: Diagnosis not present

## 2012-10-30 DIAGNOSIS — Z7901 Long term (current) use of anticoagulants: Secondary | ICD-10-CM | POA: Diagnosis not present

## 2012-10-30 DIAGNOSIS — Z950 Presence of cardiac pacemaker: Secondary | ICD-10-CM | POA: Diagnosis not present

## 2012-10-30 DIAGNOSIS — I2510 Atherosclerotic heart disease of native coronary artery without angina pectoris: Secondary | ICD-10-CM | POA: Diagnosis not present

## 2012-10-30 DIAGNOSIS — I4891 Unspecified atrial fibrillation: Secondary | ICD-10-CM | POA: Diagnosis not present

## 2012-10-30 DIAGNOSIS — I10 Essential (primary) hypertension: Secondary | ICD-10-CM | POA: Diagnosis not present

## 2012-11-19 ENCOUNTER — Encounter: Payer: Self-pay | Admitting: *Deleted

## 2012-11-19 DIAGNOSIS — K219 Gastro-esophageal reflux disease without esophagitis: Secondary | ICD-10-CM | POA: Insufficient documentation

## 2012-11-19 DIAGNOSIS — B351 Tinea unguium: Secondary | ICD-10-CM | POA: Insufficient documentation

## 2012-11-19 DIAGNOSIS — E119 Type 2 diabetes mellitus without complications: Secondary | ICD-10-CM | POA: Insufficient documentation

## 2012-11-28 ENCOUNTER — Ambulatory Visit (INDEPENDENT_AMBULATORY_CARE_PROVIDER_SITE_OTHER): Payer: Medicare Other | Admitting: Pharmacist

## 2012-11-28 DIAGNOSIS — I4891 Unspecified atrial fibrillation: Secondary | ICD-10-CM | POA: Diagnosis not present

## 2012-11-28 LAB — POCT INR: INR: 1.7

## 2012-11-29 ENCOUNTER — Ambulatory Visit: Payer: Self-pay | Admitting: Podiatry

## 2012-12-04 ENCOUNTER — Encounter: Payer: Self-pay | Admitting: Internal Medicine

## 2012-12-12 ENCOUNTER — Ambulatory Visit (INDEPENDENT_AMBULATORY_CARE_PROVIDER_SITE_OTHER): Payer: Medicare Other | Admitting: *Deleted

## 2012-12-12 DIAGNOSIS — I4891 Unspecified atrial fibrillation: Secondary | ICD-10-CM

## 2012-12-12 LAB — POCT INR: INR: 1.9

## 2012-12-21 ENCOUNTER — Encounter: Payer: Self-pay | Admitting: *Deleted

## 2012-12-26 ENCOUNTER — Ambulatory Visit (INDEPENDENT_AMBULATORY_CARE_PROVIDER_SITE_OTHER): Payer: Medicare Other | Admitting: Pharmacist

## 2012-12-26 DIAGNOSIS — I4891 Unspecified atrial fibrillation: Secondary | ICD-10-CM

## 2013-01-04 ENCOUNTER — Encounter: Payer: Self-pay | Admitting: Internal Medicine

## 2013-01-04 ENCOUNTER — Ambulatory Visit (INDEPENDENT_AMBULATORY_CARE_PROVIDER_SITE_OTHER): Payer: Medicare Other | Admitting: Internal Medicine

## 2013-01-04 VITALS — BP 139/86 | HR 74 | Ht 72.0 in | Wt 204.8 lb

## 2013-01-04 DIAGNOSIS — I4891 Unspecified atrial fibrillation: Secondary | ICD-10-CM

## 2013-01-04 DIAGNOSIS — I498 Other specified cardiac arrhythmias: Secondary | ICD-10-CM

## 2013-01-04 DIAGNOSIS — R001 Bradycardia, unspecified: Secondary | ICD-10-CM

## 2013-01-04 NOTE — Progress Notes (Signed)
Primary Cardiologist:  Dr Juline Patch is a 79 y.o. male who presents today for routine electrophysiology followup.  Since his pacemaker was implanted, the patient reports doing very well.  Today, he denies symptoms of palpitations, chest pain, shortness of breath,  lower extremity edema, dizziness, presyncope, or syncope.  The patient is otherwise without complaint today.   Past Medical History  Diagnosis Date  . PVD (peripheral vascular disease)   . Permanent atrial fibrillation   . DVT (deep venous thrombosis)   . Coronary artery disease   . Aortic stenosis   . GERD (gastroesophageal reflux disease)   . BPH (benign prostatic hypertrophy)   . Kidney stones   . Diverticulosis   . Shortness of breath   . Diabetes mellitus without complication     type 2  . 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 Symptomatic bradycardia 09-2012    s/p Medtronic pacemaker implanted by Dr Johney Frame   . Gout   . Dermatophytosis of nail   . Gastroesophageal reflux disease    Past Surgical History  Procedure Laterality Date  . Coronary artery bypass graft    . Aortic valve replacement    . Paraesophageal hernia repair    . Hernia repair    . Pacemaker insertion  10-04-2012    Medtronic pacemaker implanted by Dr Johney Frame for symptomatic bradycardia    Current Outpatient Prescriptions  Medication Sig Dispense Refill  . furosemide (LASIX) 20 MG tablet Take 20 mg by mouth every other day.      [name] glimepiride (AMARYL) 1 MG tablet Take 1 mg by mouth daily before breakfast.      . metFORMIN (GLUCOPHAGE) 500 MG tablet Take 500 mg by mouth 2 (two) times daily with a meal.       . nitroGLYCERIN (NITROSTAT) 0.4 MG SL tablet Place 1 tablet (0.4 mg total) under the tongue every 5 (five) minutes x 3 doses as needed for chest pain.  25 tablet  6  . ramipril (ALTACE) 5 MG capsule Take 2.5 mg by mouth daily.       . ranitidine (ZANTAC) 150 MG tablet Take 150 mg by mouth 2 (two) times daily.      . simvastatin  (ZOCOR) 40 MG tablet Take 20 mg by mouth every evening.      . warfarin (COUMADIN) 2.5 MG tablet Take 2.5 mg by mouth daily.       No current facility-administered medications for this visit.    Physical Exam: Filed Vitals:   01/04/13 0957  BP: 139/86  Pulse: 74  Height: 6' (1.829 m)  Weight: 204 lb 12.8 oz (92.897 kg)    GEN- The patient is well appearing, alert and oriented x 3 today.   Head- normocephalic, atraumatic Eyes-  Sclera clear, conjunctiva pink Ears- hearing intact Oropharynx- clear Lungs- Clear to ausculation bilaterally, normal work of breathing Chest- pacemaker pocket is well healed Heart- Regular rate and rhythm (paced) GI- soft, NT, ND, + BS Extremities- no clubbing, cyanosis, + chronic dependant edema  Pacemaker interrogation- reviewed in detail today,  See PACEART report  Assessment and Plan:  1. Symptomatic bradycardia Normal pacemaker function See Pace Art report No changes today  2. HTN Stable No change required today  3. Permanent afib On coumadin  [facility] Return 8/14

## 2013-01-04 NOTE — Patient Instructions (Addendum)
  Your physician recommends that you schedule a follow-up appointment in:  August with Dr.Allred  Remote monitoring is used to monitor your Pacemaker of ICD from home. This monitoring reduces the number of office visits required to check your device to one time per year. It allows [place] to keep an eye on the functioning of your device to ensure it is working properly. You are scheduled for a device check from home on 04/08/2012. You may send your transmission at any time that day. If you have a wireless device, the transmission will be sent automatically. After your physician reviews your transmission, you will receive a postcard with your next transmission date.

## 2013-01-09 LAB — MDC_IDC_ENUM_SESS_TYPE_INCLINIC
Battery Impedance: 134 Ohm
Battery Remaining Longevity: 114 mo
Battery Voltage: 2.79 V
Brady Statistic RV Percent Paced: 99 %
Lead Channel Impedance Value: 0 Ohm
Lead Channel Pacing Threshold Amplitude: 0.5 V
Lead Channel Setting Pacing Amplitude: 2.5 V
Lead Channel Setting Pacing Pulse Width: 0.4 ms
Lead Channel Setting Sensing Sensitivity: 2 mV

## 2013-01-15 ENCOUNTER — Other Ambulatory Visit: Payer: Self-pay | Admitting: Interventional Cardiology

## 2013-01-15 ENCOUNTER — Encounter: Payer: Self-pay | Admitting: Internal Medicine

## 2013-01-22 ENCOUNTER — Ambulatory Visit (INDEPENDENT_AMBULATORY_CARE_PROVIDER_SITE_OTHER): Payer: Medicare Other | Admitting: Pharmacist

## 2013-01-22 DIAGNOSIS — I4891 Unspecified atrial fibrillation: Secondary | ICD-10-CM

## 2013-01-28 ENCOUNTER — Emergency Department: Payer: Self-pay | Admitting: Emergency Medicine

## 2013-01-28 ENCOUNTER — Other Ambulatory Visit: Payer: Self-pay | Admitting: Interventional Cardiology

## 2013-01-28 DIAGNOSIS — S5000XA Contusion of unspecified elbow, initial encounter: Secondary | ICD-10-CM | POA: Diagnosis not present

## 2013-01-28 DIAGNOSIS — M25519 Pain in unspecified shoulder: Secondary | ICD-10-CM | POA: Diagnosis not present

## 2013-01-28 DIAGNOSIS — Z7901 Long term (current) use of anticoagulants: Secondary | ICD-10-CM | POA: Diagnosis not present

## 2013-01-28 DIAGNOSIS — I2510 Atherosclerotic heart disease of native coronary artery without angina pectoris: Secondary | ICD-10-CM | POA: Diagnosis not present

## 2013-01-28 DIAGNOSIS — E119 Type 2 diabetes mellitus without complications: Secondary | ICD-10-CM | POA: Diagnosis not present

## 2013-01-28 NOTE — Telephone Encounter (Signed)
lmtrc

## 2013-02-08 DIAGNOSIS — H25099 Other age-related incipient cataract, unspecified eye: Secondary | ICD-10-CM | POA: Diagnosis not present

## 2013-02-08 DIAGNOSIS — E119 Type 2 diabetes mellitus without complications: Secondary | ICD-10-CM | POA: Diagnosis not present

## 2013-02-08 DIAGNOSIS — I10 Essential (primary) hypertension: Secondary | ICD-10-CM | POA: Diagnosis not present

## 2013-02-11 ENCOUNTER — Ambulatory Visit
Admission: RE | Admit: 2013-02-11 | Discharge: 2013-02-11 | Disposition: A | Discharge status: Home / Self Care | Payer: Medicare Other | Source: Ambulatory Visit | Attending: Internal Medicine | Admitting: Internal Medicine

## 2013-02-11 ENCOUNTER — Other Ambulatory Visit: Payer: Self-pay | Admitting: Internal Medicine

## 2013-02-11 DIAGNOSIS — M25512 Pain in left shoulder: Secondary | ICD-10-CM

## 2013-02-11 DIAGNOSIS — M25519 Pain in unspecified shoulder: Secondary | ICD-10-CM | POA: Diagnosis not present

## 2013-02-11 DIAGNOSIS — S4980XA Other specified injuries of shoulder and upper arm, unspecified arm, initial encounter: Secondary | ICD-10-CM | POA: Diagnosis not present

## 2013-02-25 DIAGNOSIS — J209 Acute bronchitis, unspecified: Secondary | ICD-10-CM | POA: Diagnosis not present

## 2013-03-05 ENCOUNTER — Ambulatory Visit (INDEPENDENT_AMBULATORY_CARE_PROVIDER_SITE_OTHER): Payer: Medicare Other | Admitting: Pharmacist

## 2013-03-05 DIAGNOSIS — I4891 Unspecified atrial fibrillation: Secondary | ICD-10-CM | POA: Diagnosis not present

## 2013-03-05 LAB — POCT INR: INR: 4.2

## 2013-03-21 DIAGNOSIS — N183 Chronic kidney disease, stage 3 unspecified: Secondary | ICD-10-CM | POA: Diagnosis not present

## 2013-03-21 DIAGNOSIS — E1149 Type 2 diabetes mellitus with other diabetic neurological complication: Secondary | ICD-10-CM | POA: Diagnosis not present

## 2013-03-21 DIAGNOSIS — I10 Essential (primary) hypertension: Secondary | ICD-10-CM | POA: Diagnosis not present

## 2013-03-21 DIAGNOSIS — Z23 Encounter for immunization: Secondary | ICD-10-CM | POA: Diagnosis not present

## 2013-03-21 DIAGNOSIS — K219 Gastro-esophageal reflux disease without esophagitis: Secondary | ICD-10-CM | POA: Diagnosis not present

## 2013-03-21 DIAGNOSIS — R609 Edema, unspecified: Secondary | ICD-10-CM | POA: Diagnosis not present

## 2013-03-21 DIAGNOSIS — G609 Hereditary and idiopathic neuropathy, unspecified: Secondary | ICD-10-CM | POA: Diagnosis not present

## 2013-03-21 DIAGNOSIS — E782 Mixed hyperlipidemia: Secondary | ICD-10-CM | POA: Diagnosis not present

## 2013-03-25 DIAGNOSIS — S5000XA Contusion of unspecified elbow, initial encounter: Secondary | ICD-10-CM | POA: Diagnosis not present

## 2013-03-25 DIAGNOSIS — S43429A Sprain of unspecified rotator cuff capsule, initial encounter: Secondary | ICD-10-CM | POA: Diagnosis not present

## 2013-03-26 ENCOUNTER — Ambulatory Visit (INDEPENDENT_AMBULATORY_CARE_PROVIDER_SITE_OTHER): Payer: Medicare Other | Admitting: *Deleted

## 2013-03-26 DIAGNOSIS — I4891 Unspecified atrial fibrillation: Secondary | ICD-10-CM

## 2013-03-26 DIAGNOSIS — Z5181 Encounter for therapeutic drug level monitoring: Secondary | ICD-10-CM

## 2013-03-26 LAB — POCT INR: INR: 2.8

## 2013-03-27 DIAGNOSIS — M25519 Pain in unspecified shoulder: Secondary | ICD-10-CM | POA: Diagnosis not present

## 2013-03-29 DIAGNOSIS — M25519 Pain in unspecified shoulder: Secondary | ICD-10-CM | POA: Diagnosis not present

## 2013-04-03 DIAGNOSIS — M25519 Pain in unspecified shoulder: Secondary | ICD-10-CM | POA: Diagnosis not present

## 2013-04-05 DIAGNOSIS — M25519 Pain in unspecified shoulder: Secondary | ICD-10-CM | POA: Diagnosis not present

## 2013-04-08 ENCOUNTER — Encounter: Payer: Medicare Other | Admitting: *Deleted

## 2013-04-08 DIAGNOSIS — M25519 Pain in unspecified shoulder: Secondary | ICD-10-CM | POA: Diagnosis not present

## 2013-04-09 DIAGNOSIS — M25519 Pain in unspecified shoulder: Secondary | ICD-10-CM | POA: Diagnosis not present

## 2013-04-09 DIAGNOSIS — S43429A Sprain of unspecified rotator cuff capsule, initial encounter: Secondary | ICD-10-CM | POA: Diagnosis not present

## 2013-04-12 ENCOUNTER — Ambulatory Visit (INDEPENDENT_AMBULATORY_CARE_PROVIDER_SITE_OTHER): Payer: Medicare Other | Admitting: Podiatry

## 2013-04-12 ENCOUNTER — Encounter: Payer: Self-pay | Admitting: Podiatry

## 2013-04-12 VITALS — BP 133/79 | HR 85 | Resp 16

## 2013-04-12 DIAGNOSIS — E1159 Type 2 diabetes mellitus with other circulatory complications: Secondary | ICD-10-CM | POA: Diagnosis not present

## 2013-04-12 DIAGNOSIS — M25519 Pain in unspecified shoulder: Secondary | ICD-10-CM | POA: Diagnosis not present

## 2013-04-12 DIAGNOSIS — M79609 Pain in unspecified limb: Secondary | ICD-10-CM

## 2013-04-12 DIAGNOSIS — Q828 Other specified congenital malformations of skin: Secondary | ICD-10-CM

## 2013-04-12 DIAGNOSIS — B351 Tinea unguium: Secondary | ICD-10-CM | POA: Diagnosis not present

## 2013-04-12 NOTE — Progress Notes (Signed)
Trim the toenails , saw dr Joylene Igo last year

## 2013-04-12 NOTE — Progress Notes (Signed)
Subjective:     Patient ID: [name], male   DOB: 06-08-1925, 80 y.o.   MRN: [id number]  HPI patient presents with painful nailbeds 1-5 both feet that are difficult for him to cut and keratotic lesion distal third right which is painful. Does have diabetes with some risk factors associated with this   Review of Systems     Objective:   Physical Exam Diminishment of vascular status both PT and DP with mild edema in the ankles and diminished hair growth noted keratotic lesion distal third right it's painful and nail disease with thickness 1-5 both feet are painful    Assessment:     At the wrist diabetic with mycotic nail infections 1-5 both feet with pain and distal keratotic lesion third right    Plan:     H&P discussed with patient and diabetic education debridement nailbeds 1-5 both feet with no iatrogenic bleeding and debrided lesion distal end third toe right with no iatrogenic bleeding noted

## 2013-04-15 ENCOUNTER — Ambulatory Visit: Payer: Self-pay | Admitting: Podiatry

## 2013-04-17 ENCOUNTER — Encounter: Payer: Self-pay | Admitting: *Deleted

## 2013-04-17 DIAGNOSIS — M25519 Pain in unspecified shoulder: Secondary | ICD-10-CM | POA: Diagnosis not present

## 2013-05-03 ENCOUNTER — Ambulatory Visit (INDEPENDENT_AMBULATORY_CARE_PROVIDER_SITE_OTHER): Payer: Medicare Other | Admitting: *Deleted

## 2013-05-03 ENCOUNTER — Telehealth: Payer: Self-pay | Admitting: Internal Medicine

## 2013-05-03 DIAGNOSIS — I4891 Unspecified atrial fibrillation: Secondary | ICD-10-CM

## 2013-05-03 DIAGNOSIS — I498 Other specified cardiac arrhythmias: Secondary | ICD-10-CM | POA: Diagnosis not present

## 2013-05-03 DIAGNOSIS — R001 Bradycardia, unspecified: Secondary | ICD-10-CM

## 2013-05-03 LAB — MDC_IDC_ENUM_SESS_TYPE_REMOTE
Battery Remaining Longevity: 107 mo
Brady Statistic RV Percent Paced: 98 %
Date Time Interrogation Session: 20150306212319
Lead Channel Impedance Value: 0 Ohm
Lead Channel Impedance Value: 572 Ohm
Lead Channel Pacing Threshold Amplitude: 0.5 V
Lead Channel Pacing Threshold Pulse Width: 0.4 ms
Lead Channel Setting Pacing Amplitude: 2.5 V
Lead Channel Setting Sensing Sensitivity: 2 mV
MDC IDC MSMT BATTERY IMPEDANCE: 159 Ohm
MDC IDC MSMT BATTERY VOLTAGE: 2.79 V
MDC IDC SET LEADCHNL RV PACING PULSEWIDTH: 0.4 ms

## 2013-05-03 NOTE — Telephone Encounter (Signed)
Spoke with patient and wife.  They will re-transmit today.  Previous transmission not received.

## 2013-05-03 NOTE — Telephone Encounter (Signed)
Spoke w/pt and wife---transmission received/kwm

## 2013-05-03 NOTE — Telephone Encounter (Signed)
Pt's wife wants to know if tranmsission was received, pls call

## 2013-05-03 NOTE — Telephone Encounter (Signed)
New message    Pt states they did a remote check from home but got a letter stating you did not get it.

## 2013-05-07 ENCOUNTER — Telehealth: Payer: Self-pay | Admitting: *Deleted

## 2013-05-07 NOTE — Telephone Encounter (Signed)
[facility] [place] requests ramipril 2.5mg  refill. Thanks, MI

## 2013-05-08 ENCOUNTER — Other Ambulatory Visit: Payer: Self-pay

## 2013-05-08 MED ORDER — RAMIPRIL 2.5 MG PO CAPS: ORAL_CAPSULE | ORAL | Status: DC

## 2013-05-08 NOTE — Telephone Encounter (Signed)
Refilled

## 2013-05-09 ENCOUNTER — Ambulatory Visit (INDEPENDENT_AMBULATORY_CARE_PROVIDER_SITE_OTHER): Payer: Medicare Other | Admitting: Pharmacist

## 2013-05-09 DIAGNOSIS — Z5181 Encounter for therapeutic drug level monitoring: Secondary | ICD-10-CM

## 2013-05-09 DIAGNOSIS — I4891 Unspecified atrial fibrillation: Secondary | ICD-10-CM | POA: Diagnosis not present

## 2013-05-09 LAB — POCT INR: INR: 2.4

## 2013-05-29 ENCOUNTER — Encounter: Payer: Self-pay | Admitting: *Deleted

## 2013-06-06 ENCOUNTER — Ambulatory Visit (INDEPENDENT_AMBULATORY_CARE_PROVIDER_SITE_OTHER): Payer: Medicare Other

## 2013-06-06 DIAGNOSIS — Z5181 Encounter for therapeutic drug level monitoring: Secondary | ICD-10-CM

## 2013-06-06 DIAGNOSIS — I4891 Unspecified atrial fibrillation: Secondary | ICD-10-CM | POA: Diagnosis not present

## 2013-06-06 LAB — POCT INR: INR: 2.1

## 2013-06-07 ENCOUNTER — Encounter: Payer: Self-pay | Admitting: Internal Medicine

## 2013-07-11 DIAGNOSIS — E1149 Type 2 diabetes mellitus with other diabetic neurological complication: Secondary | ICD-10-CM | POA: Diagnosis not present

## 2013-07-11 DIAGNOSIS — I359 Nonrheumatic aortic valve disorder, unspecified: Secondary | ICD-10-CM | POA: Diagnosis not present

## 2013-07-11 DIAGNOSIS — K219 Gastro-esophageal reflux disease without esophagitis: Secondary | ICD-10-CM | POA: Diagnosis not present

## 2013-07-11 DIAGNOSIS — E782 Mixed hyperlipidemia: Secondary | ICD-10-CM | POA: Diagnosis not present

## 2013-07-11 DIAGNOSIS — N183 Chronic kidney disease, stage 3 unspecified: Secondary | ICD-10-CM | POA: Diagnosis not present

## 2013-07-11 DIAGNOSIS — R609 Edema, unspecified: Secondary | ICD-10-CM | POA: Diagnosis not present

## 2013-07-11 DIAGNOSIS — G609 Hereditary and idiopathic neuropathy, unspecified: Secondary | ICD-10-CM | POA: Diagnosis not present

## 2013-07-11 DIAGNOSIS — I10 Essential (primary) hypertension: Secondary | ICD-10-CM | POA: Diagnosis not present

## 2013-07-12 ENCOUNTER — Ambulatory Visit: Payer: Medicare Other | Admitting: Podiatry

## 2013-07-18 ENCOUNTER — Ambulatory Visit (INDEPENDENT_AMBULATORY_CARE_PROVIDER_SITE_OTHER): Payer: Medicare Other | Admitting: *Deleted

## 2013-07-18 DIAGNOSIS — I4891 Unspecified atrial fibrillation: Secondary | ICD-10-CM | POA: Diagnosis not present

## 2013-07-18 DIAGNOSIS — Z5181 Encounter for therapeutic drug level monitoring: Secondary | ICD-10-CM | POA: Diagnosis not present

## 2013-07-18 LAB — POCT INR: INR: 2.5

## 2013-07-19 ENCOUNTER — Ambulatory Visit (INDEPENDENT_AMBULATORY_CARE_PROVIDER_SITE_OTHER): Payer: Medicare Other | Admitting: Podiatry

## 2013-07-19 VITALS — BP 116/71 | HR 84 | Resp 16

## 2013-07-19 DIAGNOSIS — B351 Tinea unguium: Secondary | ICD-10-CM

## 2013-07-19 DIAGNOSIS — M79609 Pain in unspecified limb: Secondary | ICD-10-CM

## 2013-07-19 NOTE — Progress Notes (Signed)
Subjective:     Patient ID: [name], male   DOB: 11/12/1925, 80 y.o.   MRN: [id number]  HPI patient presents with painful nail disease thickness yellow discoloration of all nails 1-5 but he cannot take care of himself   Review of Systems     Objective:   Physical Exam Neurovascular status intact with thick painful nailbeds 1-5 both feet are brittle and yellow    Assessment:     Mycotic nail infection with pain 1-5 both feet    Plan:     Debridement painful nail bed 1-5 both feet with no iatrogenic bleeding noted

## 2013-08-06 ENCOUNTER — Telehealth: Payer: Self-pay | Admitting: Cardiology

## 2013-08-06 ENCOUNTER — Ambulatory Visit (INDEPENDENT_AMBULATORY_CARE_PROVIDER_SITE_OTHER): Payer: Medicare Other | Admitting: *Deleted

## 2013-08-06 ENCOUNTER — Encounter: Payer: Self-pay | Admitting: Interventional Cardiology

## 2013-08-06 ENCOUNTER — Ambulatory Visit (INDEPENDENT_AMBULATORY_CARE_PROVIDER_SITE_OTHER): Payer: Medicare Other | Admitting: Interventional Cardiology

## 2013-08-06 VITALS — BP 127/62 | HR 66 | Ht 72.0 in | Wt 204.0 lb

## 2013-08-06 DIAGNOSIS — I4891 Unspecified atrial fibrillation: Secondary | ICD-10-CM

## 2013-08-06 DIAGNOSIS — R001 Bradycardia, unspecified: Secondary | ICD-10-CM

## 2013-08-06 DIAGNOSIS — I10 Essential (primary) hypertension: Secondary | ICD-10-CM | POA: Insufficient documentation

## 2013-08-06 DIAGNOSIS — I498 Other specified cardiac arrhythmias: Secondary | ICD-10-CM

## 2013-08-06 DIAGNOSIS — I2510 Atherosclerotic heart disease of native coronary artery without angina pectoris: Secondary | ICD-10-CM | POA: Diagnosis not present

## 2013-08-06 DIAGNOSIS — Z950 Presence of cardiac pacemaker: Secondary | ICD-10-CM | POA: Insufficient documentation

## 2013-08-06 DIAGNOSIS — R609 Edema, unspecified: Secondary | ICD-10-CM

## 2013-08-06 LAB — MDC_IDC_ENUM_SESS_TYPE_REMOTE
Battery Impedance: 231 Ohm
Battery Voltage: 2.78 V
Date Time Interrogation Session: 20150609180559
Lead Channel Setting Pacing Amplitude: 2.5 V
Lead Channel Setting Pacing Pulse Width: 0.4 ms
MDC IDC MSMT BATTERY REMAINING LONGEVITY: 98 mo
MDC IDC MSMT LEADCHNL RA IMPEDANCE VALUE: 0 Ohm
MDC IDC MSMT LEADCHNL RV IMPEDANCE VALUE: 539 Ohm
MDC IDC MSMT LEADCHNL RV PACING THRESHOLD AMPLITUDE: 0.5 V
MDC IDC MSMT LEADCHNL RV PACING THRESHOLD PULSEWIDTH: 0.4 ms
MDC IDC SET LEADCHNL RV SENSING SENSITIVITY: 2 mV
MDC IDC STAT BRADY RV PERCENT PACED: 97 %

## 2013-08-06 NOTE — Progress Notes (Signed)
Patient ID: [name], male   DOB: [birth year], 80 y.o.   MRN: [id number]    [place], [place],   [address] Phone: [phone] Fax:  [phone]  Date:  08/06/2013   ID:  [name], DOB 30-Sep-1925, MRN [id number]  PCP:  Wenda Low, MD      History of Present Illness: [name] is a 80 y.o. male who has had single vessel CABG and AVR. He has had AFib. He has to stop walking due to leg pain and weakness. No bleeding. Atrial Fibrillation F/U:  started diuretic after seeing Dr. Lysle Rubens. c/o Leg edema chronic left leg edema. He had a HR of 40 at times in June or July 2014. He has had some dizziness and fatigue with that. Now had a pacer placed a few weeks ago. Energy level is better. Denies : Chest pain.  Orthopnea.  Palpitations.  Syncope.  Feels well. Denies dizziness, chest pain, PND. No bleeding. Some dyspnea with exertion, but no more than baseline. Still has BLE swelling. Has been wearing his compression stockings. BP is well controlled at home, usually around 115/66. No falls. Still walks around the house w/o [facility] or chest pain.   Wt Readings from Last 3 Encounters:  08/06/13 204 lb (92.534 kg)  01/04/13 204 lb 12.8 oz (92.897 kg)  10/03/12 200 lb 4.8 oz (90.855 kg)     Past Medical History  Diagnosis Date  . PVD (peripheral vascular disease)   . Permanent atrial fibrillation   . DVT (deep venous thrombosis)   . Coronary artery disease   . Aortic stenosis   . GERD (gastroesophageal reflux disease)   . BPH (benign prostatic hypertrophy)   . Kidney stones   . Diverticulosis   . Shortness of breath   . Diabetes mellitus without complication     type 2  . 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 Gout   . Dermatophytosis of nail   . Gastroesophageal reflux disease     Current Outpatient Prescriptions  Medication Sig Dispense Refill  .  furosemide (LASIX) 20 MG tablet Take 20 mg by mouth every other day.      [name] glimepiride (AMARYL) 1 MG tablet Take 1 mg by mouth daily before breakfast.      . metFORMIN (GLUCOPHAGE) 500 MG tablet Take 500 mg by mouth 2 (two) times daily with a meal.       . nitroGLYCERIN (NITROSTAT) 0.4 MG SL tablet Place 1 tablet (0.4 mg total) under the tongue every 5 (five) minutes x 3 doses as needed for chest pain.  25 tablet  6  . ramipril (ALTACE) 2.5 MG capsule 1 tablet by mouth daily.  30 capsule  4  . ranitidine (ZANTAC) 150 MG tablet Take 150 mg by mouth 2 (two) times daily.      . simvastatin (ZOCOR) 20 MG tablet 1 tablet by mouth daily  30 tablet  8  . warfarin (COUMADIN) 5 MG tablet Take 1/2 tablet by mouth daily or as directed by coumadin clinic  25 tablet  5   No current facility-administered medications for this visit.    Allergies:    Allergies  Allergen Reactions  . Demerol [Meperidine] Other (See Comments)    "hallucinations"    Social History:  The patient  reports that he has never smoked. He has never used smokeless  tobacco. He reports that he does not drink alcohol or use illicit drugs.   Family History:  The patient's family history is not on file.   ROS:  Please see the history of present illness.  No nausea, vomiting.  No fevers, chills.  No focal weakness.  No dysuria. Sx. At baseline.   All other systems reviewed and negative.   PHYSICAL EXAM: VS:  BP 127/62  Pulse 66  Ht 6' (1.829 m)  Wt 204 lb (92.534 kg)  BMI 27.66 kg/m2 Well nourished, well developed, in no acute distress HEENT: normal Neck: no JVD, no carotid bruits Cardiac:  normal S1, S2; RRR;  Lungs:  clear to auscultation bilaterally, no wheezing, rhonchi or rales Abd: soft, nontender, no hepatomegaly Ext: no edema Skin: warm and dry Neuro:   no focal abnormalities noted      ASSESSMENT AND PLAN:  Coronary atherosclerosis of native coronary artery  Continue Zocor Tablet, 40 MG, 1 /2 tablet every  evening, Orally, Once a day Notes: No angina.  2. Atrial fibrillation  Notes: Rate control. Coumadin for stroke prevention.  3. Cardiac pacemaker in situ  Notes:  Energy level better post pacemaker. He is walking farther.  4. Benign essential hypertension  Continue Ramipril Capsule, 2.5 MG, 1 capsule, Orally, Once a day Notes: BP controlled on lower dose of ramipril. Check at home. If any readings below 500 systolic, would consider stopping ramipril.  5. Edema: Elevate legs as much as possible.  Try to get ankles above the level of the heart.     Signed, Mina Marble, MD, [facility] 08/06/2013 10:27 AM

## 2013-08-06 NOTE — Progress Notes (Signed)
Remote pacemaker transmission.   

## 2013-08-06 NOTE — Telephone Encounter (Signed)
LMOVM reminding pt to send remote transmission.   

## 2013-08-06 NOTE — Patient Instructions (Signed)
Your physician recommends that you continue on your current medications as directed. Please refer to the Current Medication list given to you today.  Your physician wants you to follow-up in: [place] DR. VARANASI. You will receive a reminder letter in the mail two months in advance. If you don't receive a letter, please call our office to schedule the follow-up appointment.

## 2013-08-27 ENCOUNTER — Encounter: Payer: Self-pay | Admitting: Cardiology

## 2013-08-29 ENCOUNTER — Ambulatory Visit (INDEPENDENT_AMBULATORY_CARE_PROVIDER_SITE_OTHER): Payer: Medicare Other | Admitting: Surgery

## 2013-08-29 DIAGNOSIS — I4891 Unspecified atrial fibrillation: Secondary | ICD-10-CM | POA: Diagnosis not present

## 2013-08-29 DIAGNOSIS — Z5181 Encounter for therapeutic drug level monitoring: Secondary | ICD-10-CM | POA: Diagnosis not present

## 2013-08-29 LAB — POCT INR: INR: 2.9

## 2013-09-05 ENCOUNTER — Encounter: Payer: Self-pay | Admitting: Internal Medicine

## 2013-10-01 DIAGNOSIS — N509 Disorder of male genital organs, unspecified: Secondary | ICD-10-CM | POA: Diagnosis not present

## 2013-10-01 DIAGNOSIS — M25519 Pain in unspecified shoulder: Secondary | ICD-10-CM | POA: Diagnosis not present

## 2013-10-02 ENCOUNTER — Emergency Department (HOSPITAL_COMMUNITY): Payer: Medicare Other

## 2013-10-02 ENCOUNTER — Inpatient Hospital Stay (HOSPITAL_COMMUNITY)
Admission: EM | Admit: 2013-10-02 | Discharge: 2013-10-05 | DRG: 871 | Disposition: A | Discharge status: Home Health | Payer: Medicare Other | Attending: Internal Medicine | Admitting: Internal Medicine

## 2013-10-02 ENCOUNTER — Inpatient Hospital Stay (HOSPITAL_COMMUNITY): Payer: Medicare Other

## 2013-10-02 ENCOUNTER — Encounter (HOSPITAL_COMMUNITY): Payer: Self-pay | Admitting: Emergency Medicine

## 2013-10-02 DIAGNOSIS — K219 Gastro-esophageal reflux disease without esophagitis: Secondary | ICD-10-CM | POA: Diagnosis present

## 2013-10-02 DIAGNOSIS — I129 Hypertensive chronic kidney disease with stage 1 through stage 4 chronic kidney disease, or unspecified chronic kidney disease: Secondary | ICD-10-CM | POA: Diagnosis present

## 2013-10-02 DIAGNOSIS — L03119 Cellulitis of unspecified part of limb: Secondary | ICD-10-CM

## 2013-10-02 DIAGNOSIS — E139 Other specified diabetes mellitus without complications: Secondary | ICD-10-CM

## 2013-10-02 DIAGNOSIS — Z79899 Other long term (current) drug therapy: Secondary | ICD-10-CM

## 2013-10-02 DIAGNOSIS — R6510 Systemic inflammatory response syndrome (SIRS) of non-infectious origin without acute organ dysfunction: Secondary | ICD-10-CM | POA: Diagnosis not present

## 2013-10-02 DIAGNOSIS — I2510 Atherosclerotic heart disease of native coronary artery without angina pectoris: Secondary | ICD-10-CM | POA: Diagnosis present

## 2013-10-02 DIAGNOSIS — R509 Fever, unspecified: Secondary | ICD-10-CM | POA: Diagnosis not present

## 2013-10-02 DIAGNOSIS — T45515A Adverse effect of anticoagulants, initial encounter: Secondary | ICD-10-CM | POA: Diagnosis present

## 2013-10-02 DIAGNOSIS — A419 Sepsis, unspecified organism: Principal | ICD-10-CM | POA: Diagnosis present

## 2013-10-02 DIAGNOSIS — Z950 Presence of cardiac pacemaker: Secondary | ICD-10-CM

## 2013-10-02 DIAGNOSIS — L03115 Cellulitis of right lower limb: Secondary | ICD-10-CM | POA: Diagnosis present

## 2013-10-02 DIAGNOSIS — I482 Chronic atrial fibrillation, unspecified: Secondary | ICD-10-CM

## 2013-10-02 DIAGNOSIS — E119 Type 2 diabetes mellitus without complications: Secondary | ICD-10-CM | POA: Diagnosis present

## 2013-10-02 DIAGNOSIS — N183 Chronic kidney disease, stage 3 unspecified: Secondary | ICD-10-CM | POA: Diagnosis present

## 2013-10-02 DIAGNOSIS — I959 Hypotension, unspecified: Secondary | ICD-10-CM | POA: Diagnosis not present

## 2013-10-02 DIAGNOSIS — E869 Volume depletion, unspecified: Secondary | ICD-10-CM | POA: Diagnosis present

## 2013-10-02 DIAGNOSIS — I4891 Unspecified atrial fibrillation: Secondary | ICD-10-CM | POA: Diagnosis present

## 2013-10-02 DIAGNOSIS — M79609 Pain in unspecified limb: Secondary | ICD-10-CM | POA: Diagnosis not present

## 2013-10-02 DIAGNOSIS — N400 Enlarged prostate without lower urinary tract symptoms: Secondary | ICD-10-CM | POA: Diagnosis present

## 2013-10-02 DIAGNOSIS — R791 Abnormal coagulation profile: Secondary | ICD-10-CM | POA: Diagnosis present

## 2013-10-02 DIAGNOSIS — M259 Joint disorder, unspecified: Secondary | ICD-10-CM | POA: Diagnosis not present

## 2013-10-02 DIAGNOSIS — Z888 Allergy status to other drugs, medicaments and biological substances status: Secondary | ICD-10-CM

## 2013-10-02 DIAGNOSIS — Z87442 Personal history of urinary calculi: Secondary | ICD-10-CM

## 2013-10-02 DIAGNOSIS — N179 Acute kidney failure, unspecified: Secondary | ICD-10-CM | POA: Diagnosis present

## 2013-10-02 DIAGNOSIS — R609 Edema, unspecified: Secondary | ICD-10-CM

## 2013-10-02 DIAGNOSIS — Z951 Presence of aortocoronary bypass graft: Secondary | ICD-10-CM

## 2013-10-02 DIAGNOSIS — G8929 Other chronic pain: Secondary | ICD-10-CM | POA: Diagnosis present

## 2013-10-02 DIAGNOSIS — J9601 Acute respiratory failure with hypoxia: Secondary | ICD-10-CM | POA: Diagnosis present

## 2013-10-02 DIAGNOSIS — D649 Anemia, unspecified: Secondary | ICD-10-CM

## 2013-10-02 DIAGNOSIS — J189 Pneumonia, unspecified organism: Secondary | ICD-10-CM | POA: Diagnosis not present

## 2013-10-02 DIAGNOSIS — I10 Essential (primary) hypertension: Secondary | ICD-10-CM | POA: Diagnosis present

## 2013-10-02 DIAGNOSIS — D689 Coagulation defect, unspecified: Secondary | ICD-10-CM

## 2013-10-02 DIAGNOSIS — N170 Acute kidney failure with tubular necrosis: Secondary | ICD-10-CM | POA: Diagnosis present

## 2013-10-02 DIAGNOSIS — J9600 Acute respiratory failure, unspecified whether with hypoxia or hypercapnia: Secondary | ICD-10-CM | POA: Diagnosis present

## 2013-10-02 DIAGNOSIS — R0602 Shortness of breath: Secondary | ICD-10-CM | POA: Diagnosis not present

## 2013-10-02 DIAGNOSIS — N189 Chronic kidney disease, unspecified: Secondary | ICD-10-CM | POA: Diagnosis present

## 2013-10-02 DIAGNOSIS — D509 Iron deficiency anemia, unspecified: Secondary | ICD-10-CM | POA: Diagnosis present

## 2013-10-02 DIAGNOSIS — Z952 Presence of prosthetic heart valve: Secondary | ICD-10-CM | POA: Diagnosis not present

## 2013-10-02 DIAGNOSIS — B351 Tinea unguium: Secondary | ICD-10-CM

## 2013-10-02 DIAGNOSIS — I739 Peripheral vascular disease, unspecified: Secondary | ICD-10-CM | POA: Diagnosis present

## 2013-10-02 DIAGNOSIS — L02419 Cutaneous abscess of limb, unspecified: Secondary | ICD-10-CM | POA: Diagnosis present

## 2013-10-02 DIAGNOSIS — M109 Gout, unspecified: Secondary | ICD-10-CM | POA: Diagnosis present

## 2013-10-02 DIAGNOSIS — D6832 Hemorrhagic disorder due to extrinsic circulating anticoagulants: Secondary | ICD-10-CM | POA: Diagnosis present

## 2013-10-02 DIAGNOSIS — R001 Bradycardia, unspecified: Secondary | ICD-10-CM

## 2013-10-02 DIAGNOSIS — Z5181 Encounter for therapeutic drug level monitoring: Secondary | ICD-10-CM

## 2013-10-02 DIAGNOSIS — Z7901 Long term (current) use of anticoagulants: Secondary | ICD-10-CM | POA: Diagnosis not present

## 2013-10-02 DIAGNOSIS — K5730 Diverticulosis of large intestine without perforation or abscess without bleeding: Secondary | ICD-10-CM | POA: Diagnosis present

## 2013-10-02 DIAGNOSIS — Z954 Presence of other heart-valve replacement: Secondary | ICD-10-CM

## 2013-10-02 DIAGNOSIS — Z86718 Personal history of other venous thrombosis and embolism: Secondary | ICD-10-CM | POA: Diagnosis not present

## 2013-10-02 LAB — URINALYSIS, ROUTINE W REFLEX MICROSCOPIC
Glucose, UA: NEGATIVE mg/dL
HGB URINE DIPSTICK: NEGATIVE
Ketones, ur: NEGATIVE mg/dL
Leukocytes, UA: NEGATIVE
Nitrite: NEGATIVE
PROTEIN: NEGATIVE mg/dL
Specific Gravity, Urine: 1.022 (ref 1.005–1.030)
UROBILINOGEN UA: 1 mg/dL (ref 0.0–1.0)
pH: 5.5 (ref 5.0–8.0)

## 2013-10-02 LAB — CBC
HCT: 34.3 % — ABNORMAL LOW (ref 39.0–52.0)
Hemoglobin: 11 g/dL — ABNORMAL LOW (ref 13.0–17.0)
MCH: 31.7 pg (ref 26.0–34.0)
MCHC: 32.1 g/dL (ref 30.0–36.0)
MCV: 98.8 fL (ref 78.0–100.0)
Platelets: 166 10*3/uL (ref 150–400)
RBC: 3.47 MIL/uL — ABNORMAL LOW (ref 4.22–5.81)
RDW: 14.3 % (ref 11.5–15.5)
WBC: 13 10*3/uL — ABNORMAL HIGH (ref 4.0–10.5)

## 2013-10-02 LAB — VITAMIN B12: Vitamin B-12: 249 pg/mL (ref 211–911)

## 2013-10-02 LAB — GLUCOSE, CAPILLARY
Glucose-Capillary: 118 mg/dL — ABNORMAL HIGH (ref 70–99)
Glucose-Capillary: 174 mg/dL — ABNORMAL HIGH (ref 70–99)
Glucose-Capillary: 162 mg/dL — ABNORMAL HIGH (ref 70–99)
Glucose-Capillary: 151 mg/dL — ABNORMAL HIGH (ref 70–99)

## 2013-10-02 LAB — BASIC METABOLIC PANEL
Anion gap: 14 (ref 5–15)
Anion gap: 12 (ref 5–15)
BUN: 26 mg/dL — AB (ref 6–23)
BUN: 27 mg/dL — ABNORMAL HIGH (ref 6–23)
CO2: 24 mEq/L (ref 19–32)
CO2: 23 mEq/L (ref 19–32)
CREATININE: 1.4 mg/dL — AB (ref 0.50–1.35)
Calcium: 9 mg/dL (ref 8.4–10.5)
Calcium: 8.5 mg/dL (ref 8.4–10.5)
Chloride: 102 mEq/L (ref 96–112)
Chloride: 104 mEq/L (ref 96–112)
Creatinine, Ser: 1.55 mg/dL — ABNORMAL HIGH (ref 0.50–1.35)
GFR calc Af Amer: 51 mL/min — ABNORMAL LOW (ref >90)
GFR calc Af Amer: 45 mL/min — ABNORMAL LOW (ref >90)
GFR calc non Af Amer: 39 mL/min — ABNORMAL LOW (ref >90)
GFR, EST NON AFRICAN AMERICAN: 44 mL/min — AB (ref >90)
Glucose, Bld: 140 mg/dL — ABNORMAL HIGH (ref 70–99)
Glucose, Bld: 172 mg/dL — ABNORMAL HIGH (ref 70–99)
Potassium: 4.7 mEq/L (ref 3.7–5.3)
Potassium: 5 mEq/L (ref 3.7–5.3)
Sodium: 140 mEq/L (ref 137–147)
Sodium: 139 mEq/L (ref 137–147)

## 2013-10-02 LAB — PROTIME-INR
INR: 3.08 — ABNORMAL HIGH (ref 0.00–1.49)
Prothrombin Time: 31.8 seconds — ABNORMAL HIGH (ref 11.6–15.2)

## 2013-10-02 LAB — CBC WITH DIFFERENTIAL/PLATELET
BASOS PCT: 0 % (ref 0–1)
Basophils Absolute: 0 10*3/uL (ref 0.0–0.1)
EOS ABS: 0.1 10*3/uL (ref 0.0–0.7)
EOS PCT: 0 % (ref 0–5)
HEMATOCRIT: 35.7 % — AB (ref 39.0–52.0)
HEMOGLOBIN: 11.4 g/dL — AB (ref 13.0–17.0)
Lymphocytes Relative: 5 % — ABNORMAL LOW (ref 12–46)
Lymphs Abs: 0.6 10*3/uL — ABNORMAL LOW (ref 0.7–4.0)
MCH: 30.9 pg (ref 26.0–34.0)
MCHC: 31.9 g/dL (ref 30.0–36.0)
MCV: 96.7 fL (ref 78.0–100.0)
MONO ABS: 0.6 10*3/uL (ref 0.1–1.0)
MONOS PCT: 5 % (ref 3–12)
Neutro Abs: 10.6 10*3/uL — ABNORMAL HIGH (ref 1.7–7.7)
Neutrophils Relative %: 90 % — ABNORMAL HIGH (ref 43–77)
Platelets: 184 10*3/uL (ref 150–400)
RBC: 3.69 MIL/uL — ABNORMAL LOW (ref 4.22–5.81)
RDW: 14 % (ref 11.5–15.5)
WBC: 11.9 10*3/uL — ABNORMAL HIGH (ref 4.0–10.5)

## 2013-10-02 LAB — APTT: APTT: 37 s (ref 24–37)

## 2013-10-02 LAB — MRSA PCR SCREENING: MRSA BY PCR: NEGATIVE

## 2013-10-02 LAB — RETICULOCYTES
RBC.: 3.47 MIL/uL — ABNORMAL LOW (ref 4.22–5.81)
Retic Count, Absolute: 27.8 10*3/uL (ref 19.0–186.0)
Retic Ct Pct: 0.8 % (ref 0.4–3.1)

## 2013-10-02 LAB — I-STAT CG4 LACTIC ACID, ED: Lactic Acid, Venous: 2.18 mmol/L (ref 0.5–2.2)

## 2013-10-02 LAB — HEMOGLOBIN A1C
Hgb A1c MFr Bld: 6.9 % — ABNORMAL HIGH (ref <5.7)
Mean Plasma Glucose: 151 mg/dL — ABNORMAL HIGH (ref <117)

## 2013-10-02 LAB — FERRITIN: Ferritin: 72 ng/mL (ref 22–322)

## 2013-10-02 LAB — CK: CK TOTAL: 77 U/L (ref 7–232)

## 2013-10-02 LAB — FOLATE: Folate: 8.2 ng/mL

## 2013-10-02 MED ORDER — HYDROMORPHONE HCL PF 1 MG/ML IJ SOLN: 0.5000 mg | INTRAMUSCULAR | Status: DC | PRN

## 2013-10-02 MED ORDER — ACETAMINOPHEN 325 MG PO TABS: 650.0000 mg | ORAL_TABLET | Freq: Four times a day (QID) | ORAL | Status: DC | PRN

## 2013-10-02 MED ORDER — PIPERACILLIN-TAZOBACTAM 3.375 G IVPB 30 MIN
3.3750 g | Freq: Once | INTRAVENOUS | Status: AC
  Administered 2013-10-02: 3.375 g via INTRAVENOUS
  Filled 2013-10-02: qty 50

## 2013-10-02 MED ORDER — PIPERACILLIN-TAZOBACTAM 3.375 G IVPB
3.3750 g | Freq: Three times a day (TID) | INTRAVENOUS | Status: DC
  Administered 2013-10-02 – 2013-10-05 (×10): 3.375 g via INTRAVENOUS
  Filled 2013-10-02 (×11): qty 50

## 2013-10-02 MED ORDER — ALUM & MAG HYDROXIDE-SIMETH 200-200-20 MG/5ML PO SUSP: 30.0000 mL | Freq: Four times a day (QID) | ORAL | Status: DC | PRN

## 2013-10-02 MED ORDER — INSULIN ASPART 100 UNIT/ML ~~LOC~~ SOLN
0.0000 [IU] | Freq: Every day | SUBCUTANEOUS | Status: DC
Start: 2013-10-02 — End: 2013-10-05

## 2013-10-02 MED ORDER — TRAMADOL HCL 50 MG PO TABS
50.0000 mg | ORAL_TABLET | Freq: Four times a day (QID) | ORAL | Status: DC | PRN
  Administered 2013-10-02 – 2013-10-04 (×3): 50 mg via ORAL
  Filled 2013-10-02 (×3): qty 1

## 2013-10-02 MED ORDER — FAMOTIDINE 20 MG PO TABS
20.0000 mg | ORAL_TABLET | Freq: Every day | ORAL | Status: DC
  Administered 2013-10-02 – 2013-10-05 (×4): 20 mg via ORAL
  Filled 2013-10-02 (×4): qty 1

## 2013-10-02 MED ORDER — ONDANSETRON HCL 4 MG/2ML IJ SOLN: 4.0000 mg | Freq: Four times a day (QID) | INTRAMUSCULAR | Status: DC | PRN

## 2013-10-02 MED ORDER — SODIUM CHLORIDE 0.9 % IV SOLN
INTRAVENOUS | Status: DC
  Administered 2013-10-04: 16:00:00 via INTRAVENOUS

## 2013-10-02 MED ORDER — VANCOMYCIN HCL IN DEXTROSE 1-5 GM/200ML-% IV SOLN
1000.0000 mg | Freq: Once | INTRAVENOUS | Status: AC
  Administered 2013-10-02: 1000 mg via INTRAVENOUS
  Filled 2013-10-02: qty 200

## 2013-10-02 MED ORDER — ALBUTEROL SULFATE (2.5 MG/3ML) 0.083% IN NEBU: 2.5000 mg | INHALATION_SOLUTION | RESPIRATORY_TRACT | Status: DC | PRN

## 2013-10-02 MED ORDER — SODIUM CHLORIDE 0.9 % IV BOLUS (SEPSIS)
1000.0000 mL | Freq: Once | INTRAVENOUS | Status: AC
  Administered 2013-10-02: 1000 mL via INTRAVENOUS

## 2013-10-02 MED ORDER — FAMOTIDINE 20 MG PO TABS
20.0000 mg | ORAL_TABLET | Freq: Two times a day (BID) | ORAL | Status: DC
  Filled 2013-10-02 (×2): qty 1

## 2013-10-02 MED ORDER — WARFARIN - PHYSICIAN DOSING INPATIENT: Freq: Every day | Status: DC

## 2013-10-02 MED ORDER — INSULIN ASPART 100 UNIT/ML ~~LOC~~ SOLN
0.0000 [IU] | Freq: Three times a day (TID) | SUBCUTANEOUS | Status: DC
  Administered 2013-10-02: 2 [IU] via SUBCUTANEOUS
  Administered 2013-10-03 – 2013-10-04 (×3): 1 [IU] via SUBCUTANEOUS

## 2013-10-02 MED ORDER — ACETAMINOPHEN 650 MG RE SUPP: 650.0000 mg | Freq: Four times a day (QID) | RECTAL | Status: DC | PRN

## 2013-10-02 MED ORDER — OXYCODONE HCL 5 MG PO TABS: 5.0000 mg | ORAL_TABLET | ORAL | Status: DC | PRN

## 2013-10-02 MED ORDER — ONDANSETRON HCL 4 MG/2ML IJ SOLN: 4.0000 mg | Freq: Three times a day (TID) | INTRAMUSCULAR | Status: DC | PRN

## 2013-10-02 MED ORDER — ONDANSETRON HCL 4 MG PO TABS: 4.0000 mg | ORAL_TABLET | Freq: Four times a day (QID) | ORAL | Status: DC | PRN

## 2013-10-02 MED ORDER — SODIUM CHLORIDE 0.9 % IV SOLN
INTRAVENOUS | Status: DC
  Administered 2013-10-02: 04:00:00 via INTRAVENOUS

## 2013-10-02 MED ORDER — ALBUTEROL SULFATE (2.5 MG/3ML) 0.083% IN NEBU
2.5000 mg | INHALATION_SOLUTION | Freq: Four times a day (QID) | RESPIRATORY_TRACT | Status: DC
  Administered 2013-10-02 (×2): 2.5 mg via RESPIRATORY_TRACT
  Filled 2013-10-02 (×2): qty 3

## 2013-10-02 MED ORDER — WARFARIN SODIUM 2.5 MG PO TABS
2.5000 mg | ORAL_TABLET | Freq: Every day | ORAL | Status: DC
  Filled 2013-10-02: qty 1

## 2013-10-02 MED ORDER — SIMVASTATIN 20 MG PO TABS
20.0000 mg | ORAL_TABLET | Freq: Every day | ORAL | Status: DC
  Administered 2013-10-02 – 2013-10-05 (×4): 20 mg via ORAL
  Filled 2013-10-02 (×4): qty 1

## 2013-10-02 MED ORDER — WARFARIN - PHARMACIST DOSING INPATIENT: Freq: Every day | Status: DC

## 2013-10-02 MED ORDER — VANCOMYCIN HCL IN DEXTROSE 750-5 MG/150ML-% IV SOLN
750.0000 mg | Freq: Two times a day (BID) | INTRAVENOUS | Status: DC
  Administered 2013-10-02 – 2013-10-05 (×6): 750 mg via INTRAVENOUS
  Filled 2013-10-02 (×7): qty 150

## 2013-10-02 NOTE — ED Notes (Signed)
MD at bedside. 

## 2013-10-02 NOTE — ED Provider Notes (Signed)
CSN: [id number]     Arrival date & time 10/02/13  0000 History   First MD Initiated Contact with Patient 10/02/13 0006     Chief Complaint  Patient presents with  . Shortness of Breath  . Fever  . Groin Pain     (Consider location/radiation/quality/duration/timing/severity/associated sxs/prior Treatment) HPI Comments: The patient is an 79 year old male with a history of atrial fibrillation, diabetes, DVT, atrial fibrillation, history of pacemaker implantation one year ago, Medtronic. This was for symptomatic bradycardia. The patient is on Coumadin, he presents with a fever times one day. He had associated chills, occasional coughing but no vomiting, no rashes, no dysuria, no diarrhea. He has permanent severe swelling of his bilateral lower extremities which has not changed. He denies any shortness of breath at this time and states that he feels fine. For months he has had pain in his right groin which is intermittent and worse with moving his right leg. This was particularly bad this evening but has seemed to improve significantly at this time is only mild. This is a chronic pain and not due to this evening. He was given Tylenol for fever of 102 at 9:00 PM.  Patient is a 80 y.o. male presenting with shortness of breath, fever, and groin pain. The history is provided by the patient and the spouse.  Shortness of Breath Associated symptoms: fever   Fever Groin Pain Associated symptoms include shortness of breath.    Past Medical History  Diagnosis Date  . PVD (peripheral vascular disease)   . Permanent atrial fibrillation   . DVT (deep venous thrombosis)   . Coronary artery disease   . Aortic stenosis   . GERD (gastroesophageal reflux disease)   . BPH (benign prostatic hypertrophy)   . Kidney stones   . Diverticulosis   . Shortness of breath   . Diabetes mellitus without complication     type 2  . 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 Gout   . Dermatophytosis of nail   . Gastroesophageal reflux disease    Past Surgical History  Procedure Laterality Date  . Coronary artery bypass graft    . Aortic valve replacement    . Paraesophageal hernia repair    . Hernia repair    . Pacemaker insertion  10-04-2012    Medtronic pacemaker implanted by Dr Rayann Heman for symptomatic bradycardia   Family History  Problem Relation Age of Onset  . Cancer - Other Father    History  Substance Use Topics  . Smoking status: Never Smoker   . Smokeless tobacco: Never Used  . Alcohol Use: No    Review of Systems  Constitutional: Positive for fever.  Respiratory: Positive for shortness of breath.   All other systems reviewed and are negative.     Allergies  Demerol  Home Medications   Prior to Admission medications   Medication Sig Start Date End Date Taking? Authorizing Provider  co-enzyme Q-10 30 MG capsule Take 100 mg by mouth daily.   Yes Historical Provider, MD  furosemide (LASIX) 20 MG tablet Take 20 mg by mouth every other day.   Yes Historical Provider, MD  glimepiride (AMARYL) 1 MG tablet Take 1 mg by mouth daily before breakfast.   Yes Historical Provider, MD  metFORMIN (GLUCOPHAGE) 500 MG tablet Take 500 mg by mouth 2 (two) times daily with a meal.    Yes  Historical Provider, MD  nitroGLYCERIN (NITROSTAT) 0.4 MG SL tablet Place 1 tablet (0.4 mg total) under the tongue every 5 (five) minutes x 3 doses as needed for chest pain. 10/05/12  Yes Jettie Booze, MD  ramipril (ALTACE) 2.5 MG capsule Take 2.5 mg by mouth daily.   Yes Historical Provider, MD  ranitidine (ZANTAC) 150 MG tablet Take 150 mg by mouth 2 (two) times daily.   Yes Historical Provider, MD  simvastatin (ZOCOR) 20 MG tablet Take 20 mg by mouth daily.   Yes Historical Provider, MD  warfarin (COUMADIN) 5 MG tablet Take 2.5 mg by mouth daily.   Yes Historical Provider, MD   BP 92/42  Pulse 59  Temp(Src) 98.9  F (37.2 C) (Oral)  Resp 26  SpO2 96% Physical Exam  Nursing note and vitals reviewed. Constitutional: He appears well-developed and well-nourished. No distress.  HENT:  Head: Normocephalic and atraumatic.  Mouth/Throat: Oropharynx is clear and moist. No oropharyngeal exudate.  Eyes: Conjunctivae and EOM are normal. Pupils are equal, round, and reactive to light. Right eye exhibits no discharge. Left eye exhibits no discharge. No scleral icterus.  Neck: Normal range of motion. Neck supple. No JVD present. No thyromegaly present.  Cardiovascular: Normal rate, regular rhythm and intact distal pulses.  Exam reveals no gallop and no friction rub.   Murmur ( Systolic murmur) heard. Pulmonary/Chest: Effort normal and breath sounds normal. No respiratory distress. He has no wheezes. He has no rales.  Abdominal: Soft. Bowel sounds are normal. He exhibits no distension and no mass. There is no tenderness.  Musculoskeletal: Normal range of motion. He exhibits edema (bilateral symmetrical edema, no redness). He exhibits no tenderness.  Lymphadenopathy:    He has no cervical adenopathy.  Neurological: He is alert. Coordination normal.  Skin: Skin is warm and dry. No rash noted. No erythema.  Psychiatric: He has a normal mood and affect. His behavior is normal.    ED Course  Procedures (including critical care time) Labs Review Labs Reviewed  CBC WITH DIFFERENTIAL - Abnormal; Notable for the following:    WBC 11.9 (*)    RBC 3.69 (*)    Hemoglobin 11.4 (*)    HCT 35.7 (*)    Neutrophils Relative % 90 (*)    Neutro Abs 10.6 (*)    Lymphocytes Relative 5 (*)    Lymphs Abs 0.6 (*)    All other components within normal limits  BASIC METABOLIC PANEL - Abnormal; Notable for the following:    Glucose, Bld 140 (*)    BUN 26 (*)    Creatinine, Ser 1.40 (*)    GFR calc non Af Amer 44 (*)    GFR calc Af Amer 51 (*)    All other components within normal limits  URINALYSIS, ROUTINE W REFLEX  MICROSCOPIC - Abnormal; Notable for the following:    Bilirubin Urine SMALL (*)    All other components within normal limits  PROTIME-INR - Abnormal; Notable for the following:    Prothrombin Time 31.8 (*)    INR 3.08 (*)    All other components within normal limits  URINE CULTURE  CULTURE, BLOOD (ROUTINE X 2)  CULTURE, BLOOD (ROUTINE X 2)  APTT  I-STAT CG4 LACTIC ACID, ED    Imaging Review Dg Chest Port 1 View  (if Code Sepsis Called)  10/02/2013   CLINICAL DATA:  Shortness of breath.  Fever.  EXAM: PORTABLE CHEST - 1 VIEW  COMPARISON:  10/05/2012  FINDINGS: Chronic cardiomegaly.  The patient is status post CABG and aortic valve replacement.  There is chronic elevation of the left diaphragm, sometimes related to previous sternotomy.  Diffuse interstitial coarsening, especially at the bases. Scarring at the left base that is chronic. No definitive effusion. No pneumothorax.  Single chamber left approach pacer wire has a unchanged appearance.  IMPRESSION: Diffuse interstitial opacity which is likely congestive. In the setting of fever, atypical infection cannot be excluded.   Electronically Signed   By: Jorje Guild M.D.   On: 10/02/2013 01:08      MDM   Final diagnoses:  Sepsis, due to unspecified organism  CAP (community acquired pneumonia)    The patient has no definite source of infection on exam, overall his lungs are clear though his sats dropped to 93% on room air. He does not use home oxygen. We'll check a sepsis workup including urinalysis, blood work, chest x-ray, blood cultures. His lactic acid is 2.2 on arrival. IV fluids ordered due to the patient's mild hypotension.  2:00 AM The patient has evidence of pneumonia on chest x-ray, this is assured by the patient's chronic lung findings but with his slightly low oxygenation I suspect this is a likely source of fever. His blood pressure is still soft, requiring fluid, a broad-spectrum antibiotics have been ordered, will need  admission to  step down unit but has not needed pressors at this time. Urinalysis negative.  3:15 AM The patient is persistently hypotensive, more fluid has been given, Zosyn and vancomycin ordered, discussed with hospitalist who agrees with step down admission. Will consider central line placement is persistently hypotensive.  CRITICAL CARE Performed by: Johnna Acosta Total critical care time: 30 Critical care time was exclusive of separately billable procedures and treating other patients. Critical care was necessary to treat or prevent imminent or life-threatening deterioration. Critical care was time spent personally by me on the following activities: development of treatment plan with patient and/or surrogate as well as nursing, discussions with consultants, evaluation of patient's response to treatment, examination of patient, obtaining history from patient or surrogate, ordering and performing treatments and interventions, ordering and review of laboratory studies, ordering and review of radiographic studies, pulse oximetry and re-evaluation of patient's condition.    Johnna Acosta, MD 10/02/13 [phone]

## 2013-10-02 NOTE — Progress Notes (Signed)
ANTIBIOTIC CONSULT NOTE - INITIAL  Pharmacy Consult for Vancocin and Zosyn Indication: rule out pneumonia and rule out sepsis  Allergies  Allergen Reactions  . Demerol [Meperidine] Other (See Comments)    "hallucinations"    Patient Measurements: Height: 5\' 9"  (175.3 cm) Weight: 197 lb 15.6 oz (89.8 kg) IBW/kg (Calculated) : 70.7  Vital Signs: Temp: 98.2 F (36.8 C) (08/05 0442) Temp src: Oral (08/05 0442) BP: 92/46 mmHg (08/05 0500) Pulse Rate: 60 (08/05 0500)  Labs:  Recent Labs  10/02/13 0031  WBC 11.9*  HGB 11.4*  PLT 184  CREATININE 1.40*   Estimated Creatinine Clearance: 42.7 ml/min (by C-G formula based on Cr of 1.4).  Medical History: Past Medical History  Diagnosis Date  . PVD (peripheral vascular disease)   . Permanent atrial fibrillation   . DVT (deep venous thrombosis)   . Coronary artery disease   . Aortic stenosis   . GERD (gastroesophageal reflux disease)   . BPH (benign prostatic hypertrophy)   . Kidney stones   . Diverticulosis   . Shortness of breath   . Diabetes mellitus without complication     type 2  . 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 Gout   . Dermatophytosis of nail   . Gastroesophageal reflux disease     Medications:  Prescriptions prior to admission  Medication Sig Dispense Refill  . co-enzyme Q-10 30 MG capsule Take 100 mg by mouth daily.      . furosemide (LASIX) 20 MG tablet Take 20 mg by mouth every other day.      [name] glimepiride (AMARYL) 1 MG tablet Take 1 mg by mouth daily before breakfast.      . metFORMIN (GLUCOPHAGE) 500 MG tablet Take 500 mg by mouth 2 (two) times daily with a meal.       . nitroGLYCERIN (NITROSTAT) 0.4 MG SL tablet Place 1 tablet (0.4 mg total) under the tongue every 5 (five) minutes x 3 doses as needed for chest pain.  25 tablet  6  . ramipril (ALTACE) 2.5 MG capsule Take 2.5 mg by mouth daily.      .  ranitidine (ZANTAC) 150 MG tablet Take 150 mg by mouth 2 (two) times daily.      . simvastatin (ZOCOR) 20 MG tablet Take 20 mg by mouth daily.      [name] warfarin (COUMADIN) 5 MG tablet Take 2.5 mg by mouth daily.       Scheduled:  . sodium chloride   Intravenous STAT  . albuterol  2.5 mg Nebulization Q6H     Assessment: 80yo male presents w/ fever and groin pain as well as bilateral pitting edema, concern for sepsis w/ PNA, to begin IV ABX.  Goal of Therapy:  Vancomycin trough level 15-20 mcg/ml  Plan:  Rec'd vanc 1g and Zosyn 3.375g IV in ED; will continue with vancomycin 750mg  IV Q12H and Zosyn 3.375g IV Q8H and monitor CBC, Cx, levels prn.  Wynona Neat, PharmD, BCPS  10/02/2013,5:12 AM

## 2013-10-02 NOTE — Progress Notes (Signed)
[facility] PROGRESS NOTE  Fred [name] UKG:[id number] DOB: April 02, 1925 DOA: 10/02/2013 PCP: Fred Low, MD  Assessment/Plan: 1. Sepsis/ Atypical Pneumonia IV Vancomycin and Zosyn  IVFs  Labs for this morning pending.   2. Hypotension, unspecified- Due to #1  IVFs  Hold Anti-Hypertensives   3. AKI (acute kidney injury)  IVFs,  Monitor BUN/Cr  Holding lasix, ramipril.   4. Right thigh/hip  pain; check doppler, xray.    5. Atrial fibrillation  Monitor. Not on rate controlled medication.  Coumadin per pharmacy.   6. Coronary artery disease  Stable   7. Diabetes  Hold Metformin and Glimepiride  SSI coverage PRN  Check HbA1c.   8. Anemia, unspecified  Anemia Panel and FOBT   9. Warfarin-induced coagulopathy  Monitor Pt/INR daily  Coumadin per pharmacy.  INR at 3  Code Status: Full Code.  Family Communication:  Disposition Plan:    Consultants:  none  Procedures:  Doppler LE; pending.   Antibiotics:  Zosyn 8-5  Vancomycin 8-5  HPI/Subjective: No cough, no dyspnea. Relates right groin, thigh pain.   Objective: Filed Vitals:   10/02/13 0700  BP: 94/52  Pulse: 60  Temp:   Resp: 30    Intake/Output Summary (Last 24 hours) at 10/02/13 0737 Last data filed at 10/02/13 0423  Gross per 24 hour  Intake   1500 ml  Output      0 ml  Net   1500 ml   Filed Weights   10/02/13 0446  Weight: 89.8 kg (197 lb 15.6 oz)    Exam:   General:  Alert, in no distress.   Cardiovascular: S 1, S 2 RRR  Respiratory: No wheezes, crackles bases.   Abdomen: Bs present, soft, NT  Musculoskeletal: right leg mild edema, tender to palpation.   Data Reviewed: Basic Metabolic Panel:  Recent Labs Lab 10/02/13 0031  NA 140  K 4.7  CL 102  CO2 24  GLUCOSE 140*  BUN 26*  CREATININE 1.40*  CALCIUM 9.0   Liver Function Tests: No results found for this basename: AST, ALT, ALKPHOS, BILITOT, PROT, ALBUMIN,  in the last 168 hours No results found  for this basename: LIPASE, AMYLASE,  in the last 168 hours No results found for this basename: AMMONIA,  in the last 168 hours CBC:  Recent Labs Lab 10/02/13 0031  WBC 11.9*  NEUTROABS 10.6*  HGB 11.4*  HCT 35.7*  MCV 96.7  PLT 184   Cardiac Enzymes: No results found for this basename: CKTOTAL, CKMB, CKMBINDEX, TROPONINI,  in the last 168 hours BNP (last 3 results) No results found for this basename: PROBNP,  in the last 8760 hours CBG: No results found for this basename: GLUCAP,  in the last 168 hours  Recent Results (from the past 240 hour(s))  MRSA PCR SCREENING     Status: None   Collection Time    10/02/13  4:43 AM      Result Value Ref Range Status   MRSA by PCR NEGATIVE  NEGATIVE Final   Comment:            The GeneXpert MRSA Assay (FDA     approved for NASAL specimens     only), is one component of [name]     comprehensive MRSA colonization     surveillance program. It is not     intended to diagnose MRSA     infection nor to guide or     monitor treatment for     MRSA  infections.     Studies: Dg Chest Port 1 View  (if Code Sepsis Called)  10/02/2013   CLINICAL DATA:  Shortness of breath.  Fever.  EXAM: PORTABLE CHEST - 1 VIEW  COMPARISON:  10/05/2012  FINDINGS: Chronic cardiomegaly. The patient is status post CABG and aortic valve replacement.  There is chronic elevation of the left diaphragm, sometimes related to previous sternotomy.  Diffuse interstitial coarsening, especially at the bases. Scarring at the left base that is chronic. No definitive effusion. No pneumothorax.  Single chamber left approach pacer wire has [name] unchanged appearance.  IMPRESSION: Diffuse interstitial opacity which is likely congestive. In the setting of fever, atypical infection cannot be excluded.   Electronically Signed   By: Fred [name] M.D.   On: 10/02/2013 01:08    Scheduled Meds: . sodium chloride   Intravenous STAT  . albuterol  2.5 mg Nebulization Q6H  . famotidine  20 mg Oral  BID  . insulin aspart  0-5 Units Subcutaneous QHS  . insulin aspart  0-9 Units Subcutaneous TID WC  . piperacillin-tazobactam (ZOSYN)  IV  3.375 g Intravenous Q8H  . simvastatin  20 mg Oral Daily  . vancomycin  750 mg Intravenous Q12H  . warfarin  2.5 mg Oral QPC supper  . Warfarin - Physician Dosing Inpatient   Does not apply q1800  . Warfarin - Physician Dosing Inpatient   Does not apply q1800   Continuous Infusions: . sodium chloride 75 mL/hr at 10/02/13 0630    Principal Problem:   Sepsis Active Problems:   Atrial fibrillation   Coronary artery disease   Diabetes   SIRS (systemic inflammatory response syndrome)   Hypotension, unspecified   AKI (acute kidney injury)   Anemia, unspecified   Warfarin-induced coagulopathy    Time spent: 35 minutes.     Fred [name]  [facility] Pager [phone]. If 7PM-7AM, please contact night-coverage at [email or web address], password [facility] 10/02/2013, 7:37 AM  LOS: 0 days

## 2013-10-02 NOTE — ED Notes (Signed)
Dr. Jenkins at bedside. 

## 2013-10-02 NOTE — ED Notes (Signed)
CG-4 result reported to Dr. Sabra Heck

## 2013-10-02 NOTE — ED Notes (Signed)
Pt received 1 gram of acetaminophen from his family at 2100 tonight.

## 2013-10-02 NOTE — ED Notes (Signed)
Per EMS: Family reports patient has been c/o of fever and right sided groin pain. Temp 102 orally. O2 Sats 79% RA, 94% on 3L. Patient has bilaterally pitting edema. Currently ax4, NAD at this time.

## 2013-10-02 NOTE — H&P (Signed)
[facility] Admission History and Physical       [name] ZSW:[id number] DOB: 04-Jan-1926 DOA: 10/02/2013  Referring physician: EDP PCP: Wenda Low, MD  Specialists:   Chief Complaint:  Fevers and Chills  HPI: [name] is a 79 y.o. male with a  history of CAD, Atrial fibrillation, HTN, DM2, and AS S/P AVR, and Pacemaker who presents to the ED with complaints of fevers and chills and rigors as well as SOB over the past 24 hours.  He also had increased pain in his upper left medial thigh area which has been bothering him x 3 weeks, but was worse today.  He denies any trauma to the area or overexertion.    He was evaluated in theED and was found to have hypotension with a blood pressure of 89/39 and he was found to have a temperature of 102.  His Chest X-ray revealed  Diffuse Intersititial Opacity.  He was placed onvIV Vancomycin and Zosyn for HCAP coverage.    Review of Systems:  Constitutional: No Weight Loss, No Weight Gain, Night Sweats, +Fevers, +Chills, Dizziness, Fatigue, or Generalized Weakness HEENT: No Headaches, Difficulty Swallowing,Tooth/Dental Problems,Sore Throat,  No Sneezing, Rhinitis, Ear Ache, Nasal Congestion, or Post Nasal Drip,  Cardio-vascular:  No Chest pain, Orthopnea, PND, Edema in Lower Extremities, Anasarca, Dizziness, Palpitations  Resp: +Dyspnea, No DOE, No Cough, No Hemoptysis, No Wheezing.    GI: No Heartburn, Indigestion, Abdominal Pain, Nausea, Vomiting, Diarrhea, Hematemesis, Hematochezia, Melena, Change in Bowel Habits,  Loss of Appetite  GU: No Dysuria, Change in Color of Urine, No Urgency or Frequency, No Flank pain.  Musculoskeletal: No Joint Pain or Swelling, No Decreased Range of Motion, No Back Pain.  Neurologic: No Syncope, No Seizures, Muscle Weakness, Paresthesia, Vision Disturbance or Loss, No Diplopia, No Vertigo, No Difficulty Walking,  Skin: No Rash or Lesions. Psych: No Change in Mood or Affect, No Depression or Anxiety,  No Memory loss, No Confusion, or Hallucinations   Past Medical History  Diagnosis Date  . PVD (peripheral vascular disease)   . Permanent atrial fibrillation   . DVT (deep venous thrombosis)   . Coronary artery disease   . Aortic stenosis   . GERD (gastroesophageal reflux disease)   . BPH (benign prostatic hypertrophy)   . Kidney stones   . Diverticulosis   . Shortness of breath   . Diabetes mellitus without complication     type 2  . 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 Gout   . Dermatophytosis of nail   . Gastroesophageal reflux disease       Past Surgical History  Procedure Laterality Date  . Coronary artery bypass graft    . Aortic valve replacement    . Paraesophageal hernia repair    . Hernia repair    . Pacemaker insertion  10-04-2012    Medtronic pacemaker implanted by Dr Rayann Heman for symptomatic bradycardia      Prior to Admission medications   Medication Sig Start Date End Date Taking? Authorizing Provider  co-enzyme Q-10 30 MG capsule Take 100 mg by mouth daily.   Yes Historical Provider, MD  furosemide (LASIX) 20 MG tablet Take 20 mg by mouth every other day.   Yes Historical Provider, MD  glimepiride (AMARYL) 1 MG tablet Take 1 mg by mouth daily before breakfast.   Yes Historical Provider, MD  metFORMIN (GLUCOPHAGE) 500 MG tablet  Take 500 mg by mouth 2 (two) times daily with a meal.    Yes Historical Provider, MD  nitroGLYCERIN (NITROSTAT) 0.4 MG SL tablet Place 1 tablet (0.4 mg total) under the tongue every 5 (five) minutes x 3 doses as needed for chest pain. 10/05/12  Yes Jettie Booze, MD  ramipril (ALTACE) 2.5 MG capsule Take 2.5 mg by mouth daily.   Yes Historical Provider, MD  ranitidine (ZANTAC) 150 MG tablet Take 150 mg by mouth 2 (two) times daily.   Yes Historical Provider, MD  simvastatin (ZOCOR) 20 MG tablet Take 20 mg by mouth daily.   Yes Historical  Provider, MD  warfarin (COUMADIN) 5 MG tablet Take 2.5 mg by mouth daily.   Yes Historical Provider, MD     Allergies  Allergen Reactions  . Demerol [Meperidine] Other (See Comments)    "hallucinations"    Social History:  reports that he has never smoked. He has never used smokeless tobacco. He reports that he does not drink alcohol or use illicit drugs.     Family History  Problem Relation Age of Onset  . Cancer - Other Father       Physical Exam:  GEN:  Pleasant Morbidly Obese Elderly 80 y.o. Caucasian male examined and in no acute distress; cooperative with exam Filed Vitals:   10/02/13 0442 10/02/13 0446 10/02/13 0500 10/02/13 0600  BP:   92/46 105/49  Pulse: 63 61 60 61  Temp: 98.2 F (36.8 C)     TempSrc: Oral     Resp:  27 29 31   Height:  5\' 9"  (1.753 m)    Weight:  89.8 kg (197 lb 15.6 oz)    SpO2:  100% 99% 100%   Blood pressure 105/49, pulse 61, temperature 98.2 F (36.8 C), temperature source Oral, resp. rate 31, height 5\' 9"  (1.753 m), weight 89.8 kg (197 lb 15.6 oz), SpO2 100.00%. PSYCH: He is alert and oriented x4; does not appear anxious does not appear depressed; affect is normal HEENT: Normocephalic and Atraumatic, Mucous membranes pink; PERRLA; EOM intact; Fundi:  Benign;  No scleral icterus, Nares: Patent, Oropharynx: Clear, Edentulous;      Neck:  FROM, No Cervical Lymphadenopathy nor Thyromegaly or Carotid Bruit; No JVD; Breasts:: Not examined CHEST WALL: No tenderness CHEST: Normal respiration, clear to auscultation bilaterally HEART: Regular rate and rhythm; no murmurs rubs or gallops BACK: No kyphosis or scoliosis; No CVA tenderness ABDOMEN: Positive Bowel Sounds, Obese, Soft Non-Tender; No Masses, No Organomegaly, No Pannus; No Intertriginous candida. Rectal Exam: Not done EXTREMITIES: No Cyanosis, Clubbing, or Edema; No Ulcerations. Genitalia: not examined PULSES: 2+ and symmetric SKIN: Normal hydration no rash or ulceration CNS: Alert and  Oriented x 4, No Focal Deficits Vascular: pulses palpable throughout    Labs on Admission:  Basic Metabolic Panel:  Recent Labs Lab 10/02/13 0031  NA 140  K 4.7  CL 102  CO2 24  GLUCOSE 140*  BUN 26*  CREATININE 1.40*  CALCIUM 9.0   Liver Function Tests: No results found for this basename: AST, ALT, ALKPHOS, BILITOT, PROT, ALBUMIN,  in the last 168 hours No results found for this basename: LIPASE, AMYLASE,  in the last 168 hours No results found for this basename: AMMONIA,  in the last 168 hours CBC:  Recent Labs Lab 10/02/13 0031  WBC 11.9*  NEUTROABS 10.6*  HGB 11.4*  HCT 35.7*  MCV 96.7  PLT 184   Cardiac Enzymes: No results found for this basename: CKTOTAL,  CKMB, CKMBINDEX, TROPONINI,  in the last 168 hours  BNP (last 3 results) No results found for this basename: PROBNP,  in the last 8760 hours CBG: No results found for this basename: GLUCAP,  in the last 168 hours  Radiological Exams on Admission: Dg Chest Port 1 View  (if Code Sepsis Called)  10/02/2013   CLINICAL DATA:  Shortness of breath.  Fever.  EXAM: PORTABLE CHEST - 1 VIEW  COMPARISON:  10/05/2012  FINDINGS: Chronic cardiomegaly. The patient is status post CABG and aortic valve replacement.  There is chronic elevation of the left diaphragm, sometimes related to previous sternotomy.  Diffuse interstitial coarsening, especially at the bases. Scarring at the left base that is chronic. No definitive effusion. No pneumothorax.  Single chamber left approach pacer wire has a unchanged appearance.  IMPRESSION: Diffuse interstitial opacity which is likely congestive. In the setting of fever, atypical infection cannot be excluded.   Electronically Signed   By: Jorje Guild M.D.   On: 10/02/2013 01:08     EKG: Independently reviewed. Paced   Assessment/Plan:   80 y.o. male with  Principal Problem:   1.   Sepsis/ SIRS (systemic inflammatory response syndrome)-   Atypical Pneumonia   IV Vancomycin and  Zosyn   IVFs     2.   Hypotension, unspecified- Due to #1   IVFs   Hold Anti-Hypertensives        3.   AKI (acute kidney injury)   IVFs,   Monitor BUN/Cr     4.   Atrial fibrillation   Monitor      5.   Coronary artery disease   Stable     6.   Diabetes   Hold Metformin and Glimepiride   SSI coverage PRN   Check HbA1c.          7.   Anemia, unspecified   Send Anemia Panel and FOBT     8.  Warfarin-induced coagulopathy   Monitor Pt/INR daily   Adjust PRN    Code Status:    FULL CODE Family Communication:    Wife at Bedside Disposition Plan:    Inpatient     Time spent:  [facility] Pager [phone]   If [place] Please Contact the Day Rounding Team MD for [facility]  If 7PM-7AM, Please Contact night-coverage  [email or web address] Password [facility] 10/02/2013, 6:49 AM

## 2013-10-02 NOTE — Progress Notes (Signed)
ANTICOAGULATION CONSULT NOTE - Initial Consult  Pharmacy Consult for coumadin Indication: atrial fibrillation  Allergies  Allergen Reactions  . Demerol [Meperidine] Other (See Comments)    "hallucinations"    Patient Measurements: Height: 5\' 9"  (175.3 cm) Weight: 197 lb 15.6 oz (89.8 kg) IBW/kg (Calculated) : 70.7   Vital Signs: Temp: 98.8 F (37.1 C) (08/05 0809) Temp src: Oral (08/05 0809) BP: 94/52 mmHg (08/05 0700) Pulse Rate: 60 (08/05 0700)  Labs:  Recent Labs  10/02/13 0031 10/02/13 0043  HGB 11.4*  --   HCT 35.7*  --   PLT 184  --   APTT  --  37  LABPROT  --  31.8*  INR  --  3.08*  CREATININE 1.40*  --     Estimated Creatinine Clearance: 42.7 ml/min (by C-G formula based on Cr of 1.4).   Medical History: Past Medical History  Diagnosis Date  . PVD (peripheral vascular disease)   . Permanent atrial fibrillation   . DVT (deep venous thrombosis)   . Coronary artery disease   . Aortic stenosis   . GERD (gastroesophageal reflux disease)   . BPH (benign prostatic hypertrophy)   . Kidney stones   . Diverticulosis   . Shortness of breath   . Diabetes mellitus without complication     type 2  . 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 Gout   . Dermatophytosis of nail   . Gastroesophageal reflux disease     Medications:  Prescriptions prior to admission  Medication Sig Dispense Refill  . co-enzyme Q-10 30 MG capsule Take 100 mg by mouth daily.      . furosemide (LASIX) 20 MG tablet Take 20 mg by mouth every other day.      [name] glimepiride (AMARYL) 1 MG tablet Take 1 mg by mouth daily before breakfast.      . metFORMIN (GLUCOPHAGE) 500 MG tablet Take 500 mg by mouth 2 (two) times daily with a meal.       . nitroGLYCERIN (NITROSTAT) 0.4 MG SL tablet Place 1 tablet (0.4 mg total) under the tongue every 5 (five) minutes x 3 doses as needed for chest pain.  25 tablet  6   . ramipril (ALTACE) 2.5 MG capsule Take 2.5 mg by mouth daily.      . ranitidine (ZANTAC) 150 MG tablet Take 150 mg by mouth 2 (two) times daily.      . simvastatin (ZOCOR) 20 MG tablet Take 20 mg by mouth daily.      [name] warfarin (COUMADIN) 5 MG tablet Take 2.5 mg by mouth daily.       Scheduled:  . albuterol  2.5 mg Nebulization Q6H  . famotidine  20 mg Oral BID  . insulin aspart  0-5 Units Subcutaneous QHS  . insulin aspart  0-9 Units Subcutaneous TID WC  . piperacillin-tazobactam (ZOSYN)  IV  3.375 g Intravenous Q8H  . simvastatin  20 mg Oral Daily  . vancomycin  750 mg Intravenous Q12H  . Warfarin - Physician Dosing Inpatient   Does not apply q1800  . Warfarin - Physician Dosing Inpatient   Does not apply q1800    Assessment: 80 yo male here with fever and chills on antibiotics also on coumadin PTA for afib and pharmacy has been consulted to dose. INR today= 3.08, Hg= 11.4, plt= 184.  Home coumadin dose: 2.5mg /day (last taken 10/01/13)  Goal of Therapy:  INR 2-3 Monitor platelets by anticoagulation protocol: Yes   Plan:  -Hold coumadin today -Daily PT/INR  Hildred Laser, Pharm D 10/02/2013 9:16 AM

## 2013-10-03 DIAGNOSIS — L02419 Cutaneous abscess of limb, unspecified: Secondary | ICD-10-CM

## 2013-10-03 DIAGNOSIS — J189 Pneumonia, unspecified organism: Secondary | ICD-10-CM | POA: Diagnosis present

## 2013-10-03 DIAGNOSIS — L03119 Cellulitis of unspecified part of limb: Secondary | ICD-10-CM

## 2013-10-03 DIAGNOSIS — J9601 Acute respiratory failure with hypoxia: Secondary | ICD-10-CM | POA: Diagnosis present

## 2013-10-03 DIAGNOSIS — Z950 Presence of cardiac pacemaker: Secondary | ICD-10-CM

## 2013-10-03 DIAGNOSIS — M79609 Pain in unspecified limb: Secondary | ICD-10-CM

## 2013-10-03 DIAGNOSIS — L03115 Cellulitis of right lower limb: Secondary | ICD-10-CM | POA: Diagnosis present

## 2013-10-03 LAB — IRON AND TIBC
Iron: 10 ug/dL — ABNORMAL LOW (ref 42–135)
SATURATION RATIOS: 4 % — AB (ref 20–55)
TIBC: 235 ug/dL (ref 215–435)
UIBC: 225 ug/dL (ref 125–400)

## 2013-10-03 LAB — BASIC METABOLIC PANEL
Anion gap: 11 (ref 5–15)
BUN: 26 mg/dL — AB (ref 6–23)
CHLORIDE: 104 meq/L (ref 96–112)
CO2: 26 meq/L (ref 19–32)
Calcium: 8.3 mg/dL — ABNORMAL LOW (ref 8.4–10.5)
Creatinine, Ser: 1.6 mg/dL — ABNORMAL HIGH (ref 0.50–1.35)
GFR calc Af Amer: 44 mL/min — ABNORMAL LOW (ref >90)
GFR calc non Af Amer: 38 mL/min — ABNORMAL LOW (ref >90)
GLUCOSE: 118 mg/dL — AB (ref 70–99)
Potassium: 5 mEq/L (ref 3.7–5.3)
SODIUM: 141 meq/L (ref 137–147)

## 2013-10-03 LAB — URINE CULTURE

## 2013-10-03 LAB — PROTIME-INR
INR: 3.14 — ABNORMAL HIGH (ref 0.00–1.49)
Prothrombin Time: 32.3 seconds — ABNORMAL HIGH (ref 11.6–15.2)

## 2013-10-03 LAB — CBC
HEMATOCRIT: 33.4 % — AB (ref 39.0–52.0)
HEMOGLOBIN: 10.5 g/dL — AB (ref 13.0–17.0)
MCH: 30.9 pg (ref 26.0–34.0)
MCHC: 31.4 g/dL (ref 30.0–36.0)
MCV: 98.2 fL (ref 78.0–100.0)
Platelets: 175 10*3/uL (ref 150–400)
RBC: 3.4 MIL/uL — ABNORMAL LOW (ref 4.22–5.81)
RDW: 14.9 % (ref 11.5–15.5)
WBC: 9.8 10*3/uL (ref 4.0–10.5)

## 2013-10-03 LAB — GLUCOSE, CAPILLARY
GLUCOSE-CAPILLARY: 108 mg/dL — AB (ref 70–99)
Glucose-Capillary: 139 mg/dL — ABNORMAL HIGH (ref 70–99)
Glucose-Capillary: 127 mg/dL — ABNORMAL HIGH (ref 70–99)
Glucose-Capillary: 104 mg/dL — ABNORMAL HIGH (ref 70–99)

## 2013-10-03 MED ORDER — SODIUM CHLORIDE 0.9 % IV SOLN: 500.0000 mg | Freq: Once | INTRAVENOUS | Status: DC

## 2013-10-03 MED ORDER — SODIUM CHLORIDE 0.9 % IV SOLN
500.0000 mg | Freq: Once | INTRAVENOUS | Status: AC
  Administered 2013-10-03: 500 mg via INTRAVENOUS
  Filled 2013-10-03: qty 10

## 2013-10-03 MED ORDER — SODIUM CHLORIDE 0.9 % IV SOLN
25.0000 mg | Freq: Once | INTRAVENOUS | Status: AC
  Administered 2013-10-03: 25 mg via INTRAVENOUS
  Filled 2013-10-03: qty 0.5

## 2013-10-03 NOTE — Progress Notes (Signed)
Upon assessment it was noticed that the patient's RLE was reddened compared to the LLE. The redness extends from the right ankle to the knee, it is mildly warmer than the LLE, the patient reports no pain, swelling is difficult to identify due to already present edema. The on-call physician, Tylene Fantasia, was notified of the condition.

## 2013-10-03 NOTE — Progress Notes (Signed)
VASCULAR LAB PRELIMINARY  PRELIMINARY  PRELIMINARY  PRELIMINARY  Right lower extremity venous Dopplers completed.    Preliminary report:  There is no DVT or SVT noted in the right lower extremity.   Seini Lannom, RVT 10/03/2013, 2:18 PM

## 2013-10-03 NOTE — Progress Notes (Signed)
[place] for coumadin Indication: atrial fibrillation  Allergies  Allergen Reactions  . Demerol [Meperidine] Other (See Comments)    "hallucinations"    Patient Measurements: Height: 5\' 9"  (175.3 cm) Weight: 197 lb 15.6 oz (89.8 kg) IBW/kg (Calculated) : 70.7   Vital Signs: Temp: 97.8 F (36.6 C) (08/06 1109) Temp src: Oral (08/06 1109) BP: 97/59 mmHg (08/06 1109) Pulse Rate: 59 (08/06 1109)  Labs:  Recent Labs  10/02/13 0031 10/02/13 0043 10/02/13 1158 10/03/13 0253  HGB 11.4*  --  11.0* 10.5*  HCT 35.7*  --  34.3* 33.4*  PLT 184  --  166 175  APTT  --  37  --   --   LABPROT  --  31.8*  --  32.3*  INR  --  3.08*  --  3.14*  CREATININE 1.40*  --  1.55* 1.60*  CKTOTAL  --   --  77  --     Estimated Creatinine Clearance: 37.4 ml/min (by C-G formula based on Cr of 1.6).   Medical History: Past Medical History  Diagnosis Date  . PVD (peripheral vascular disease)   . Permanent atrial fibrillation   . DVT (deep venous thrombosis)   . Coronary artery disease   . Aortic stenosis   . GERD (gastroesophageal reflux disease)   . BPH (benign prostatic hypertrophy)   . Kidney stones   . Diverticulosis   . Shortness of breath   . Diabetes mellitus without complication     type 2  . 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 Gout   . Dermatophytosis of nail   . Gastroesophageal reflux disease      Assessment: 80 yo male here with fever and chills on empiric antibiotics also on coumadin PTA for afib and pharmacy has been consulted to dose.  INR today= 3.14, Hg= 10.5, plt= 175.  Home coumadin dose: 2.5mg /day (last taken 10/01/13)  Goal of Therapy:  INR 2-3 Monitor platelets by anticoagulation protocol: Yes   Plan:  -Continue Hold coumadin today -Daily PT/INR  Erin Hearing PharmD., BCPS Clinical Pharmacist Pager [phone] 10/03/2013  1:16 PM

## 2013-10-03 NOTE — Progress Notes (Signed)
[name]  [name] [id number] DOB: 01/29/1926 DOA: 10/02/2013 PCP: Wenda Low, MD   Brief narrative: 80 y.o. WM PMHx CAD, Atrial fibrillation, HTN, DM2, and AS S/P AVR, and Pacemaker who presented to the ED with complaints of fevers and chills and rigors as well as SOB. Onset was 24 hours prior to presentation. He also had increased pain in his upper left medial thigh area which has been bothering him x 3 weeks but had increased in severity. He denied any trauma to the area or overexertion.  In the ED he was found to have hypotension with a blood pressure of 89/39 and an elevated temperature of 102. His Chest X-ray revealed Diffuse Intersititial Opacity. He was placed on IV Vancomycin and Zosyn for HCAP coverage.  HPI/Subjective: States left leg pain markedly decreased- new pain right lower leg-denied SOB or CP  Assessment/Plan: Active Problems:   Sepsis -source most likely RLE cellulitis -less impressed with CXR findings -cont supportive care and empiric anbx's    Hypotension -due to sepsis and relative volume depletion -cont IVFs and hold anti HTN agents and diuretics    Acute respiratory failure with hypoxia/?Atypical pneumonia -could be viral etiology but no definitive focal opacities -CXR actually not significantly different than previous films    AKI (acute kidney injury) -baseline renal function 24/1.28 -possible a degree of ATN noting on Metformin, Lasix and Altace pre admit    Iron deficiency anemia -Fe <10 -give 1x dose IV Iron    Warfarin-induced coagulopathy -pharmacy managing Warfarin -no signs of active bleeding    Cellulitis of right lower extremity with bilateral LE edema -still has redness RLE but has decreased significantly from when initially observed after admit (ink marking at knee level and redness now at calf level) -cot empiric anbx's -LE Duplex pending but given INR therapeutic at presentation  doubt DVT    Pain left leg -XRs unremarkable    Atrial fibrillation -100% V paced    Coronary artery disease    Diabetes mellitus -hgb A1C 6.9    Essential hypertension, benign -BP soft- hme meds on hold   DVT prophylaxis: On warfarin prior to admission with mildly Supratherapeutic INR Code Status: Full Family Communication: No family at bedside Disposition Plan/Expected LOS: Step down due to ongoing hypotension   Consultants: None  Procedures: Right lower extremity venous duplex pending  Cultures: Blood cultures pending Urine culture unremarkable  Antibiotics: Zosyn 8/4 >>> Vancomycin 8/4 >>>  Objective: Blood pressure 97/59, pulse 59, temperature 97.8 F (36.6 C), temperature source Oral, resp. rate 20, height 5\' 9"  (1.753 m), weight 197 lb 15.6 oz (89.8 kg), SpO2 100.00%.  Intake/Output Summary (Last 24 hours) at 10/03/13 1206 Last data filed at 10/03/13 0600  Gross per 24 hour  Intake 2162.5 ml  Output   1150 ml  Net 1012.5 ml     Exam: Gen: No acute respiratory distress Chest: Clear to auscultation bilaterally without wheezes, rhonchi or crackles, 2L Cardiac: Regular rate and rhythm, S1-S2, no rubs murmurs or gallops, bilateral lower extremity peripheral edema-1-2 +, no JVD Abdomen: Soft nontender nondistended without obvious hepatosplenomegaly, no ascites Extremities: Symmetrical in appearance without cyanosis, clubbing or effusion but noted with circumferential erythema RLE below knee which is tender to touch  Scheduled Meds:  Scheduled Meds: . famotidine  20 mg Oral Daily  . insulin aspart  0-5 Units Subcutaneous QHS  . insulin aspart  0-9 Units Subcutaneous TID WC  . iron  dextran (INFED/DEXFERRUM) infusion  500 mg Intravenous Once  . piperacillin-tazobactam (ZOSYN)  IV  3.375 g Intravenous Q8H  . simvastatin  20 mg Oral Daily  . vancomycin  750 mg Intravenous Q12H  . Warfarin - Pharmacist Dosing Inpatient   Does not apply q1800   Continuous  Infusions: . sodium chloride 125 mL/hr at 10/03/13 3557    Data Reviewed: Basic Metabolic Panel:  Recent Labs Lab 10/02/13 0031 10/02/13 1158 10/03/13 0253  NA 140 139 141  K 4.7 5.0 5.0  CL 102 104 104  CO2 24 23 26   GLUCOSE 140* 172* 118*  BUN 26* 27* 26*  CREATININE 1.40* 1.55* 1.60*  CALCIUM 9.0 8.5 8.3*   Liver Function Tests: No results found for this basename: AST, ALT, ALKPHOS, BILITOT, PROT, ALBUMIN,  in the last 168 hours No results found for this basename: LIPASE, AMYLASE,  in the last 168 hours No results found for this basename: AMMONIA,  in the last 168 hours CBC:  Recent Labs Lab 10/02/13 0031 10/02/13 1158 10/03/13 0253  WBC 11.9* 13.0* 9.8  NEUTROABS 10.6*  --   --   HGB 11.4* 11.0* 10.5*  HCT 35.7* 34.3* 33.4*  MCV 96.7 98.8 98.2  PLT 184 166 175   Cardiac Enzymes:  Recent Labs Lab 10/02/13 1158  CKTOTAL 77   BNP (last 3 results) No results found for this basename: PROBNP,  in the last 8760 hours CBG:  Recent Labs Lab 10/02/13 0831 10/02/13 1210 10/02/13 1711 10/02/13 2131 10/03/13 0819  GLUCAP 118* 174* 162* 151* 108*    Recent Results (from the past 240 hour(s))  CULTURE, BLOOD (ROUTINE X 2)     Status: None   Collection Time    10/02/13 12:25 AM      Result Value Ref Range Status   Specimen Description BLOOD LEFT ARM   Final   Special Requests BOTTLES DRAWN AEROBIC AND ANAEROBIC 10CC EACH   Final   Culture  Setup Time     Final   Value: 10/02/2013 08:21     Performed at [facility]   Culture     Final   Value:        BLOOD CULTURE RECEIVED NO GROWTH TO DATE CULTURE WILL BE HELD FOR 5 DAYS BEFORE ISSUING A FINAL NEGATIVE REPORT     Performed at [facility]   Report Status PENDING   Incomplete  CULTURE, BLOOD (ROUTINE X 2)     Status: None   Collection Time    10/02/13 12:30 AM      Result Value Ref Range Status   Specimen Description BLOOD LEFT HAND   Final   Special Requests BOTTLES DRAWN AEROBIC  ONLY 10CC   Final   Culture  Setup Time     Final   Value: 10/02/2013 08:21     Performed at [facility]   Culture     Final   Value:        BLOOD CULTURE RECEIVED NO GROWTH TO DATE CULTURE WILL BE HELD FOR 5 DAYS BEFORE ISSUING A FINAL NEGATIVE REPORT     Performed at [facility]   Report Status PENDING   Incomplete  URINE CULTURE     Status: None   Collection Time    10/02/13  1:52 AM      Result Value Ref Range Status   Specimen Description URINE, CLEAN CATCH   Final   Special Requests NONE   Final   Culture  Setup Time     Final   Value: 10/02/2013 02:21     Performed at [place]     Final   Value: 9,000 COLONIES/ML     Performed at [facility]   Culture     Final   Value: INSIGNIFICANT GROWTH     Performed at [facility]   Report Status 10/03/2013 FINAL   Final  MRSA PCR SCREENING     Status: None   Collection Time    10/02/13  4:43 AM      Result Value Ref Range Status   MRSA by PCR NEGATIVE  NEGATIVE Final   Comment:            The GeneXpert MRSA Assay (FDA     approved for NASAL specimens     only), is one component of a     comprehensive MRSA colonization     surveillance program. It is not     intended to diagnose MRSA     infection nor to guide or     monitor treatment for     MRSA infections.     Studies:  Recent x-ray studies have been reviewed in detail by the Attending Physician  Time spent :      Erin Hearing, [place] [facility] Office  [phone] Pager [phone]   **If unable to reach the above provider after paging please contact the [place] @ [phone]  On-Call/Text Page:      [email or web address]      password [email or web address]  If 7PM-7AM, please contact night-coverage [email or web address] Password [email or web address] 10/03/2013, 12:06 PM   LOS: 1 day    Examined patient with ANP Ebony Hail and agree with assessment and plan. Patient with multiple complex medical problems direct patient care> 40  minute

## 2013-10-04 LAB — VANCOMYCIN, TROUGH: Vancomycin Tr: 18.5 ug/mL (ref 10.0–20.0)

## 2013-10-04 LAB — BASIC METABOLIC PANEL
ANION GAP: 13 (ref 5–15)
BUN: 21 mg/dL (ref 6–23)
CO2: 21 meq/L (ref 19–32)
CREATININE: 1.32 mg/dL (ref 0.50–1.35)
Calcium: 8.1 mg/dL — ABNORMAL LOW (ref 8.4–10.5)
Chloride: 105 mEq/L (ref 96–112)
GFR calc non Af Amer: 47 mL/min — ABNORMAL LOW (ref >90)
GFR, EST AFRICAN AMERICAN: 55 mL/min — AB (ref >90)
Glucose, Bld: 98 mg/dL (ref 70–99)
POTASSIUM: 4 meq/L (ref 3.7–5.3)
Sodium: 139 mEq/L (ref 137–147)

## 2013-10-04 LAB — PROTIME-INR
INR: 3.01 — AB (ref 0.00–1.49)
Prothrombin Time: 31.2 seconds — ABNORMAL HIGH (ref 11.6–15.2)

## 2013-10-04 LAB — GLUCOSE, CAPILLARY
GLUCOSE-CAPILLARY: 93 mg/dL (ref 70–99)
GLUCOSE-CAPILLARY: 126 mg/dL — AB (ref 70–99)
Glucose-Capillary: 130 mg/dL — ABNORMAL HIGH (ref 70–99)
Glucose-Capillary: 142 mg/dL — ABNORMAL HIGH (ref 70–99)

## 2013-10-04 LAB — CBC
HEMATOCRIT: 31.8 % — AB (ref 39.0–52.0)
Hemoglobin: 10.3 g/dL — ABNORMAL LOW (ref 13.0–17.0)
MCH: 31.5 pg (ref 26.0–34.0)
MCHC: 32.4 g/dL (ref 30.0–36.0)
MCV: 97.2 fL (ref 78.0–100.0)
PLATELETS: 157 10*3/uL (ref 150–400)
RBC: 3.27 MIL/uL — ABNORMAL LOW (ref 4.22–5.81)
RDW: 14.6 % (ref 11.5–15.5)
WBC: 6.1 10*3/uL (ref 4.0–10.5)

## 2013-10-04 MED ORDER — WARFARIN SODIUM 1 MG PO TABS
1.0000 mg | ORAL_TABLET | Freq: Once | ORAL | Status: AC
  Administered 2013-10-04: 1 mg via ORAL
  Filled 2013-10-04: qty 1

## 2013-10-04 MED ORDER — CYANOCOBALAMIN 1000 MCG/ML IJ SOLN
1000.0000 ug | Freq: Every day | INTRAMUSCULAR | Status: DC
  Administered 2013-10-04: 1000 ug via SUBCUTANEOUS
  Filled 2013-10-04 (×2): qty 1

## 2013-10-04 NOTE — Evaluation (Signed)
Physical Therapy Evaluation Patient Details Name: [name] MRN: [id number] DOB: Aug 15, 1925 Today's Date: 10/04/2013   History of Present Illness  80 y.o. WM PMHx CAD, Atrial fibrillation, HTN, DM2, and AS S/P AVR, and Pacemaker who presented to the ED with complaints of fevers and chills and rigors as well as SOB. Onset was 24 hours prior to presentation. He also had increased pain in his upper left medial thigh area which has been bothering him x 3 weeks but had increased in severity. He denied any trauma to the area or overexertion. In the ED he was found to have hypotension with a blood pressure of 89/39 and an elevated temperature of 102. His Chest X-ray revealed Diffuse Intersititial Opacity. He was placed on IV meds for HCAP coverage.  Clinical Impression  Pt admitted with the above. Pt currently with functional limitations due to the deficits listed below (see PT Problem List). At the time of PT eval pt required max assist to stand from recliner chair, and per RN required +2 assist to for bed mobility and transfer to chair. Pt will benefit from skilled PT to increase their independence and safety with mobility to allow discharge to the venue listed below. Discussed HHPT at d/c as pt's goal is to return home, however also discussed that if functional progress was not made and pt continues to require significant assist for mobility, SNF may be necessary. Pt in agreement.      Follow Up Recommendations Home health PT;Supervision for mobility/OOB    Equipment Recommendations  Rolling walker with 5" wheels;3in1 (PT)    Recommendations for Other Services       Precautions / Restrictions Precautions Precautions: Fall Other Brace/Splint: Pt has AFO for foot drop that he wears all the time. Wife to bring it.  Restrictions Weight Bearing Restrictions: No      Mobility  Bed Mobility               General bed mobility comments: Pt sitting in chair upon PT  arrival  Transfers Overall transfer level: Needs assistance Equipment used: Rolling walker (2 wheeled) Transfers: Sit to/from [employer] Transfers Sit to Stand: Max assist Stand pivot transfers: Mod assist       General transfer comment: Significant assist to stand from the low recliner chair. VC's for hand placement on seated surface for safety as well as technique to increase success. Assist to SPT to [facility] with RW - recommend gait belt for safety as pt very unsteady due to swollen feet and foot drop on L  Ambulation/Gait             General Gait Details: Further gait training not assessed due to safety and no +2 assist available.   Stairs            Wheelchair Mobility    Modified Rankin (Stroke Patients Only)       Balance Overall balance assessment: Needs assistance Sitting-balance support: Feet supported;No upper extremity supported Sitting balance-Leahy Scale: Good     Standing balance support: Bilateral upper extremity supported;During functional activity Standing balance-Leahy Scale: Poor Standing balance comment: Very close guard and mod assist for dynamic standing activity.                              Pertinent Vitals/Pain Pain Assessment: No/denies pain    Home Living Family/patient expects to be discharged to:: Private residence Living Arrangements: Spouse/significant other Available Help at Discharge:  Family;Available 24 hours/day Type of Home: House Home Access: Stairs to enter Entrance Stairs-Rails: None Entrance Stairs-Number of Steps: 2 Home Layout: One level Home Equipment: Cane - single point      Prior Function Level of Independence: Independent with assistive device(s)         Comments: Still driving, wife does grocery shopping, indpendent with all ADL's per pt     Hand Dominance   Dominant Hand: Right    Extremity/Trunk Assessment   Upper Extremity Assessment: Defer to OT evaluation            Lower Extremity Assessment: Generalized weakness;LLE deficits/detail   LLE Deficits / Details: Foot drop from back injury years ago in which pt wears an AFO all the time.  Cervical / Trunk Assessment: Kyphotic  Communication   Communication: No difficulties  Cognition Arousal/Alertness: Awake/alert Behavior During Therapy: WFL for tasks assessed/performed Overall Cognitive Status: Within Functional Limits for tasks assessed                      General Comments      Exercises        Assessment/Plan    PT Assessment Patient needs continued PT services  PT Diagnosis Difficulty walking;Generalized weakness   PT Problem List Decreased strength;Decreased activity tolerance;Decreased range of motion;Decreased balance;Decreased mobility;Decreased safety awareness;Decreased knowledge of use of DME;Decreased knowledge of precautions  PT Treatment Interventions DME instruction;Gait training;Stair training;Functional mobility training;Therapeutic activities;Therapeutic exercise;Neuromuscular re-education;Patient/family education   PT Goals (Current goals can be found in the Care Plan section) Acute Rehab PT Goals Patient Stated Goal: To return home with his wife and be independent PT Goal Formulation: With patient Time For Goal Achievement: 10/11/13 Potential to Achieve Goals: Good    Frequency Min 3X/week   Barriers to discharge        Co-evaluation               End of Session Equipment Utilized During Treatment: Gait belt;Oxygen Activity Tolerance: Patient tolerated treatment well Patient left: in chair;with call bell/phone within reach Nurse Communication: Mobility status         Time: 0947-1020 PT Time Calculation (min): 33 min   Charges:   PT Evaluation $Initial PT Evaluation Tier I: 1 Procedure PT Treatments $Therapeutic Activity: 23-37 mins   PT G Codes:          Jolyn Lent 10/04/2013, 10:30 AM  Jolyn Lent, PT, DPT Acute  Rehabilitation Services Pager: [phone]

## 2013-10-04 NOTE — Progress Notes (Signed)
[facility]  KONGMENG SANTORO BJS:[id number] DOB: Jan 30, 1926 DOA: 10/02/2013 PCP: Wenda Low, MD  Brief narrative: 80 y.o. M Hx CAD, Atrial fibrillation, HTN, DM2, and AS S/P AVR, and Pacemaker who presented to the ED with complaints of fevers and chills and rigors as well as SOB. Onset was 24 hours prior to presentation. He also had increased pain in his upper left medial thigh area which has been bothering him x 3 weeks but had increased in severity. He denied any trauma to the area or overexertion.  In the ED he was found to have hypotension with a blood pressure of 89/39 and an elevated temperature of 102. His Chest X-ray revealed Diffuse Intersititial Opacity. He was placed on IV Vancomycin and Zosyn for HCAP coverage.  HPI/Subjective: Still endorsing RLE pain locally at area of cellulitis.  No cp, sob, n/v, or abdom pain.    Assessment/Plan:    Sepsis -source most likely RLE cellulitis -less impressed with CXR findings -cont supportive care and empiric anbx's    Hypotension -due to sepsis and relative volume depletion -cont IVFs but decrease rate -continue to hold anti HTN agents and diuretics    Acute respiratory failure with hypoxia / ?Atypical pneumonia -could be viral etiology but no definitive focal opacities -CXR actually not significantly different than previous films    Acute kidney injury -baseline renal function 24/1.28 -possible a degree of ATN noting on Metformin, Lasix and Altace pre admit    Iron deficiency anemia -Fe <10 -given 1x dose IV Iron -B12 was also suboptimal so replete    Warfarin-induced coagulopathy -pharmacy managing Warfarin -no signs of active bleeding    Cellulitis of right lower extremity with bilateral LE edema -still has redness RLE but has decreased significantly from when initially observed after admit (ink marking at knee level and redness now at calf level) -cot empiric anbx's -LE Duplex  preliminary without evidence of DVT    Pain left leg -XRs unremarkable    Atrial fibrillation -V paced    Coronary artery disease    Diabetes mellitus -hgb A1C 6.9  DVT prophylaxis: On warfarin prior to admission with mildly Supratherapeutic INR Code Status: Full Family Communication: No family at bedside Disposition Plan/Expected LOS: Transfer to floor  Consultants: None  Procedures: Right lower extremity venous duplex   Cultures: Blood cultures pending Urine culture unremarkable  Antibiotics: Zosyn 8/4 >>> Vancomycin 8/4 >>>  Objective: Blood pressure 127/63, pulse 66, temperature 98.5 F (36.9 C), temperature source Oral, resp. rate 20, height 5\' 9"  (1.753 m), weight 197 lb 15.6 oz (89.8 kg), SpO2 100.00%.  Intake/Output Summary (Last 24 hours) at 10/04/13 1334 Last data filed at 10/04/13 1000  Gross per 24 hour  Intake 3806.67 ml  Output    900 ml  Net 2906.67 ml   Exam: Gen: No acute respiratory distress Chest: Clear to auscultation bilaterally without wheezes, rhonchi or crackles, RA Cardiac: Regular rate and rhythm, S1-S2, no rubs murmurs or gallops, bilateral lower extremity peripheral edema-1-2 +, no JVD Abdomen: Soft nontender nondistended without obvious hepatosplenomegaly, no ascites Extremities: Symmetrical in appearance without cyanosis, clubbing or effusion but noted with circumferential erythema RLE below knee which remains tender to touch-no drainage or vesicles  Scheduled Meds:  Scheduled Meds: . cyanocobalamin  1,000 mcg Subcutaneous Daily  . famotidine  20 mg Oral Daily  . insulin aspart  0-5 Units Subcutaneous QHS  . insulin aspart  0-9 Units Subcutaneous TID WC  .  piperacillin-tazobactam (ZOSYN)  IV  3.375 g Intravenous Q8H  . simvastatin  20 mg Oral Daily  . vancomycin  750 mg Intravenous Q12H  . Warfarin - Pharmacist Dosing Inpatient   Does not apply q1800   Continuous Infusions: . sodium chloride 125 mL/hr at 10/03/13 1900    Data Reviewed: Basic Metabolic Panel:  Recent Labs Lab 10/02/13 0031 10/02/13 1158 10/03/13 0253 10/04/13 0210  NA 140 139 141 139  K 4.7 5.0 5.0 4.0  CL 102 104 104 105  CO2 24 23 26 21   GLUCOSE 140* 172* 118* 98  BUN 26* 27* 26* 21  CREATININE 1.40* 1.55* 1.60* 1.32  CALCIUM 9.0 8.5 8.3* 8.1*   Liver Function Tests: No results found for this basename: AST, ALT, ALKPHOS, BILITOT, PROT, ALBUMIN,  in the last 168 hours No results found for this basename: LIPASE, AMYLASE,  in the last 168 hours No results found for this basename: AMMONIA,  in the last 168 hours  CBC:  Recent Labs Lab 10/02/13 0031 10/02/13 1158 10/03/13 0253 10/04/13 0210  WBC 11.9* 13.0* 9.8 6.1  NEUTROABS 10.6*  --   --   --   HGB 11.4* 11.0* 10.5* 10.3*  HCT 35.7* 34.3* 33.4* 31.8*  MCV 96.7 98.8 98.2 97.2  PLT 184 166 175 157   Cardiac Enzymes:  Recent Labs Lab 10/02/13 1158  CKTOTAL 77   CBG:  Recent Labs Lab 10/03/13 1112 10/03/13 1649 10/03/13 2134 10/04/13 0833 10/04/13 1257  GLUCAP 139* 127* 104* 93 126*    Recent Results (from the past 240 hour(s))  CULTURE, BLOOD (ROUTINE X 2)     Status: None   Collection Time    10/02/13 12:25 AM      Result Value Ref Range Status   Specimen Description BLOOD LEFT ARM   Final   Special Requests BOTTLES DRAWN AEROBIC AND ANAEROBIC 10CC EACH   Final   Culture  Setup Time     Final   Value: 10/02/2013 08:21     Performed at [facility]   Culture     Final   Value:        BLOOD CULTURE RECEIVED NO GROWTH TO DATE CULTURE WILL BE HELD FOR 5 DAYS BEFORE ISSUING A FINAL NEGATIVE REPORT     Performed at [facility]   Report Status PENDING   Incomplete  CULTURE, BLOOD (ROUTINE X 2)     Status: None   Collection Time    10/02/13 12:30 AM      Result Value Ref Range Status   Specimen Description BLOOD LEFT HAND   Final   Special Requests BOTTLES DRAWN AEROBIC ONLY 10CC   Final   Culture  Setup Time     Final   Value:  10/02/2013 08:21     Performed at [facility]   Culture     Final   Value:        BLOOD CULTURE RECEIVED NO GROWTH TO DATE CULTURE WILL BE HELD FOR 5 DAYS BEFORE ISSUING A FINAL NEGATIVE REPORT     Performed at [facility]   Report Status PENDING   Incomplete  URINE CULTURE     Status: None   Collection Time    10/02/13  1:52 AM      Result Value Ref Range Status   Specimen Description URINE, CLEAN CATCH   Final   Special Requests NONE   Final   Culture  Setup Time  Final   Value: 10/02/2013 02:21     Performed at [place]     Final   Value: 9,000 COLONIES/ML     Performed at [facility]   Culture     Final   Value: INSIGNIFICANT GROWTH     Performed at [facility]   Report Status 10/03/2013 FINAL   Final  MRSA PCR SCREENING     Status: None   Collection Time    10/02/13  4:43 AM      Result Value Ref Range Status   MRSA by PCR NEGATIVE  NEGATIVE Final   Comment:            The GeneXpert MRSA Assay (FDA     approved for NASAL specimens     only), is one component of a     comprehensive MRSA colonization     surveillance program. It is not     intended to diagnose MRSA     infection nor to guide or     monitor treatment for     MRSA infections.     Studies:  Recent x-ray studies have been reviewed in detail by the Attending Physician  Time spent : [place], ANP [facility] Office  [phone] Pager [phone]   **If unable to reach the above provider after paging please contact the [facility] @ [phone]  On-Call/Text Page:      [email or web address]      password [email or web address]  If 7PM-7AM, please contact night-coverage [email or web address] Password [email or web address] 10/04/2013, 1:34 PM   LOS: 2 days   I have personally examined this patient and reviewed the entire database. I have reviewed the above note, made any necessary editorial changes, and agree with its content.  Cherene Altes, MD [facility]

## 2013-10-04 NOTE — Evaluation (Signed)
Occupational Therapy Evaluation Patient Details Name: [name] MRN: [id number] DOB: March 03, 1925 Today's Date: 10/04/2013    History of Present Illness 80 y.o. WM PMHx CAD, Atrial fibrillation, HTN, DM2, and AS S/P AVR, and Pacemaker who presented to the ED with complaints of fevers and chills and rigors as well as SOB. Onset was 24 hours prior to presentation. He also had increased pain in his upper left medial thigh area which has been bothering him x 3 weeks but had increased in severity. He denied any trauma to the area or overexertion. In the ED he was found to have hypotension with a blood pressure of 89/39 and an elevated temperature of 102. His Chest X-ray revealed Diffuse Intersititial Opacity. He was placed on IV meds for HCAP coverage.   Clinical Impression   Pt admitted with above. He demonstrates the below listed deficits and will benefit from continued OT to maximize safety and independence with BADLs.  Pt presents to OT with generalized weakness.  He is very motivated and wife is supportive.  He was modified independent with all BADLs and IADLs PTA - he still actively farms.  Currently, he requires min - mod A for BADLs - he has increased Rt. LE pain when he attempts to access Rt. Foot. [name]  He  progressed to min A with functional mobililty during eval.   Anticipate good progress.      Follow Up Recommendations  Home health OT;Supervision/Assistance - 24 hour (Recommendation for HHOT dependent on pt progress.  )    Equipment Recommendations  3 in 1 bedside comode    Recommendations for Other Services       Precautions / Restrictions Precautions Precautions: Fall Other Brace/Splint: Pt has AFO for foot drop that he wears all the time. Wife to bring it.  Restrictions Weight Bearing Restrictions: No      Mobility Bed Mobility Overal bed mobility: Needs Assistance Bed Mobility: Supine to Sit     Supine to sit: Mod assist     General bed mobility comments: Pt  requires assist to lift shoulders and scoot hips.  Cues for hand placement   Transfers Overall transfer level: Needs assistance Equipment used: Rolling walker (2 wheeled) Transfers: Sit to/from [employer] Sit to Stand: Mod assist;Min assist Stand pivot transfers: Mod assist;Min assist       General transfer comment: Pt initially requiring mod A with all mobility, but improved to min A as he became more sure of himself and with practice.  Lt LE with significant edema and pt appears unsteady, but pt reports edema is his normal.  Wife will bring in shoes and AFO on tomorrow.     Balance Overall balance assessment: Needs assistance Sitting-balance support: Feet supported Sitting balance-Leahy Scale: Good     Standing balance support: No upper extremity supported Standing balance-Leahy Scale: Fair                              ADL Overall ADL's : Needs assistance/impaired Eating/Feeding: Independent;Sitting   Grooming: Wash/dry hands;Wash/dry face;Oral care;Brushing hair;Set up;Sitting   Upper Body Bathing: Set up;Sitting   Lower Body Bathing: Moderate assistance;Sit to/from stand Lower Body Bathing Details (indicate cue type and reason): Pain increases with attempts to access Rt foot. Upper Body Dressing : Set up;Sitting   Lower Body Dressing: Moderate assistance;Sit to/from stand Lower Body Dressing Details (indicate cue type and reason): Pain increases with attempts to access Rt foot.  Toilet Transfer: Moderate assistance;RW;Minimal assistance;Ambulation;Comfort height toilet Toilet Transfer Details (indicate cue type and reason): initially required mod A - progressed to min A Toileting- Clothing Manipulation and Hygiene: Moderate assistance;Sit to/from stand       Functional mobility during ADLs: Minimal assistance;Moderate assistance;Rolling walker General ADL Comments: Pt is very motivated.  He initially required mod A with functional mobility,  but progressed to min a.  Wife present and is very helpful and supportive.  Pt able to doff socks, but pain Rt. LE increased 7-9/10.  Pt demonstrated that he can access feet for LB ADLs if absolutely necessary, but did not ask him to attempt to don Rt sock due to pain      Vision                     Perception     Praxis      Pertinent Vitals/Pain Pain Assessment: 0-10 Pain Score: 2  Pain Location: Rt. groin Pain Descriptors / Indicators: Aching Pain Intervention(s): Monitored during session;Repositioned (Pt reports pain improved with activity )     Hand Dominance Right   Extremity/Trunk Assessment Upper Extremity Assessment Upper Extremity Assessment: Overall WFL for tasks assessed   Lower Extremity Assessment Lower Extremity Assessment: Defer to PT evaluation   Cervical / Trunk Assessment Cervical / Trunk Assessment: Kyphotic   Communication Communication Communication: No difficulties   Cognition Arousal/Alertness: Awake/alert Behavior During Therapy: WFL for tasks assessed/performed Overall Cognitive Status: Within Functional Limits for tasks assessed                     General Comments       Exercises       Shoulder Instructions      Home Living Family/patient expects to be discharged to:: Private residence Living Arrangements: Spouse/significant other Available Help at Discharge: Family;Available 24 hours/day Type of Home: House Home Access: Stairs to enter [employer] of Steps: 2 Entrance Stairs-Rails: None Home Layout: One level     Bathroom Shower/Tub: Teacher, early years/pre: Standard     Home Equipment: Cane - single point          Prior Functioning/Environment Level of Independence: Independent with assistive device(s)        Comments: Still driving, Pt still actively farms.  wife does grocery shopping, indpendent with all ADL's per pt    OT Diagnosis: Generalized weakness;Acute pain   OT  Problem List: Decreased strength;Decreased activity tolerance;Impaired balance (sitting and/or standing);Decreased knowledge of use of DME or AE;Increased edema;Pain   OT Treatment/Interventions: Self-care/ADL training;DME and/or AE instruction;Therapeutic activities;Patient/family education;Balance training    OT Goals(Current goals can be found in the care plan section) Acute Rehab OT Goals Patient Stated Goal: To return home with his wife and be independent OT Goal Formulation: With patient/family Time For Goal Achievement: 10/18/13 Potential to Achieve Goals: Good ADL Goals Pt Will Perform Grooming: with supervision;standing Pt Will Perform Lower Body Bathing: with supervision;sit to/from stand Pt Will Perform Lower Body Dressing: with supervision;sit to/from stand Pt Will Transfer to Toilet: with supervision;ambulating;regular height toilet;grab bars Pt Will Perform Toileting - Clothing Manipulation and hygiene: with supervision;sit to/from stand Pt Will Perform Tub/Shower Transfer: Tub transfer;with supervision;ambulating;rolling walker  OT Frequency: Min 2X/week   Barriers to D/C:            Co-evaluation              End of Session Equipment Utilized During Treatment: Surveyor, mining  Communication: Mobility status  Activity Tolerance: Patient tolerated treatment well Patient left: in bed;with call bell/phone within reach;with family/visitor present (EOB)   Time: 9753-0051 OT Time Calculation (min): 40 min Charges:  OT General Charges $OT Visit: 1 Procedure OT Evaluation $Initial OT Evaluation Tier I: 1 Procedure OT Treatments $Self Care/Home Management : 8-22 mins $Therapeutic Activity: 8-22 mins G-Codes:    Raven Furnas M 10/30/2013, 5:40 PM

## 2013-10-04 NOTE — Progress Notes (Signed)
NURSING PROGRESS NOTE  [name] [id number] Transfer Data: 10/04/2013 2:00 PM Attending Provider: Cherene Altes, MD WUJ:[email or web address], MD Code Status: FULL   [name] is a 80 y.o. male patient transferred from [place]  -No acute distress noted.  -No complaints of shortness of breath.  -No complaints of chest pain.   Last Documented Vital Signs: Blood pressure 127/63, pulse 66, temperature 98.5 F (36.9 C), temperature source Oral, resp. rate 20, height 5\' 9"  (1.753 m), weight 89.8 kg (197 lb 15.6 oz), SpO2 100.00%.  IV Fluids:  IV in place, occlusive dsg intact without redness, IV cath wrist right, condition patent and no redness and forearm left, condition patent and no redness normal saline.   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  Past Surgical History:   has past surgical history that includes Coronary artery bypass graft; Aortic valve replacement; Paraesophageal hernia repair; Hernia repair; and Pacemaker insertion (10-04-2012).  Social History:   reports that he has never smoked. He has never used smokeless tobacco. He reports that he does not drink alcohol or use illicit drugs.  Skin: intact. Redness on RLE  Patient/Family orientated to room. Information packet given to patient/family. Admission inpatient armband information verified with patient/family to include name and date of birth and placed on patient arm. Side rails up x 2, fall assessment and education completed with patient/family.  Patient/family able to verbalize understanding of risk associated with falls and verbalized understanding to call for assistance before getting out of bed. Call light within reach. Patient/family able to voice and demonstrate understanding of unit orientation instructions.

## 2013-10-04 NOTE — Care Management Note (Signed)
    Page 1 of 2   10/04/2013     4:55:02 PM CARE MANAGEMENT NOTE 10/04/2013  Patient:  [name], [name]   Account Number:  [id number]  Date Initiated:  10/02/2013  Documentation initiated by:  [name]  Subjective/Objective Assessment:   Admitted with sepsis -     Action/Plan:   jpt eval- rec hhpt   Anticipated DC Date:  10/07/2013   Anticipated DC Plan:  [place]  CM consult      [facility] Choice  [place]   Choice offered to / List presented to:  C-3 Spouse   DME arranged  [place]      DME agency  [place] arranged  [place].   Status of service:  In process, will continue to follow Medicare Important Message given?  YES (If response is "NO", the following Medicare IM given date fields will be blank) Date Medicare IM given:  10/04/2013 Medicare IM given by:  [name] Date Additional Medicare IM given:   Additional Medicare IM given by:    Discharge Disposition:    Per UR Regulation:  Reviewed for med. necessity/level of care/duration of stay  If discussed at [place] of Stay Meetings, dates discussed:    Comments:  [employer], [name] Spouse [phone]   [phone]                 [name], [name] [phone]   [phone] 10/04/13 [name], [name] [phone] patient lives with spouse, physical therapy eval recs hhpt, spouse and patient chose [facility], referral made to [facility] for hhpt, Butch Penny notified.  Soc will begin 24-48 hrs post dc. Not sure when patient ready for dc.

## 2013-10-04 NOTE — Progress Notes (Addendum)
ANTICOAGULATION and ANTIBIOTIC CONSULT NOTE - Follow Up Consult  Pharmacy Consult for coumadin; vancomycin and zosyn Indication: atrial fibrillation; r/o sepsis, cellulitis  Allergies  Allergen Reactions  . Demerol [Meperidine] Other (See Comments)    "hallucinations"    Patient Measurements: Height: 5\' 9"  (175.3 cm) Weight: 197 lb 15.6 oz (89.8 kg) IBW/kg (Calculated) : 70.7   Vital Signs: Temp: 98.5 F (36.9 C) (08/07 1324) Temp src: Oral (08/07 1324) BP: 127/63 mmHg (08/07 1324) Pulse Rate: 66 (08/07 1324)  Labs:  Recent Labs  10/02/13 0031 10/02/13 0043 10/02/13 1158 10/03/13 0253 10/04/13 0210  HGB 11.4*  --  11.0* 10.5* 10.3*  HCT 35.7*  --  34.3* 33.4* 31.8*  PLT 184  --  166 175 157  APTT  --  37  --   --   --   LABPROT  --  31.8*  --  32.3* 31.2*  INR  --  3.08*  --  3.14* 3.01*  CREATININE 1.40*  --  1.55* 1.60* 1.32  CKTOTAL  --   --  77  --   --     Estimated Creatinine Clearance: 45.3 ml/min (by C-G formula based on Cr of 1.32).  Assessment: Patient is an 80 y.o M on coumadin for afib.  INR is at the upper end of therapeutic range with 3 today.  CBC stable, no bleeding documented.  He's also on vancomycin and zosyn day#3 for sepsis/RLE cellulitis.  All cultures have been negative thus far.  With changing renal function, will check vancomycin trough level this afternoon to assess current regimen.  8/5 vanc>> 8/5 zosyn>>  8/5 urine>> insign growth FINAL 8/5 blood x2>>ngtd   Goal of Therapy:  INR 2-3; vancomycin trough 15-20    Plan:  1) coumadin 1mg  PO x1 today 2) vancomycin trough level this afternoon  3) continue current zosyn regimen  Pham, Anh P 10/04/2013,1:27 PM   Addendum: Vancomycin trough is 18.5 and was drawn appropriately prior to dose being given.  SCr trending down with current CrCl ~ 45 mL/min.   Plan: Continue Vancomycin at 750 mg IV every 12 hours.   Sloan Leiter, PharmD, BCPS Clinical  Pharmacist [phone] 10/04/2013, 4:36 PM

## 2013-10-04 NOTE — Progress Notes (Signed)
OT Cancellation Note  Patient Details Name: [name] MRN: [id number] DOB: 09/04/1925   Cancelled Treatment:    Reason Eval/Treat Not Completed: Pt in process of transferring units.  Will reattempt  Darlina Rumpf [place], OTR/L [phone]  10/04/2013, 12:49 PM

## 2013-10-05 LAB — BASIC METABOLIC PANEL
Anion gap: 13 (ref 5–15)
BUN: 17 mg/dL (ref 6–23)
CHLORIDE: 105 meq/L (ref 96–112)
CO2: 24 meq/L (ref 19–32)
CREATININE: 1.3 mg/dL (ref 0.50–1.35)
Calcium: 8.5 mg/dL (ref 8.4–10.5)
GFR calc Af Amer: 56 mL/min — ABNORMAL LOW (ref >90)
GFR calc non Af Amer: 48 mL/min — ABNORMAL LOW (ref >90)
Glucose, Bld: 116 mg/dL — ABNORMAL HIGH (ref 70–99)
Potassium: 4.3 mEq/L (ref 3.7–5.3)
Sodium: 142 mEq/L (ref 137–147)

## 2013-10-05 LAB — PROTIME-INR
INR: 2.91 — ABNORMAL HIGH (ref 0.00–1.49)
Prothrombin Time: 30.4 seconds — ABNORMAL HIGH (ref 11.6–15.2)

## 2013-10-05 LAB — CBC
HEMATOCRIT: 33.2 % — AB (ref 39.0–52.0)
HEMOGLOBIN: 10.5 g/dL — AB (ref 13.0–17.0)
MCH: 30.6 pg (ref 26.0–34.0)
MCHC: 31.6 g/dL (ref 30.0–36.0)
MCV: 96.8 fL (ref 78.0–100.0)
Platelets: 183 10*3/uL (ref 150–400)
RBC: 3.43 MIL/uL — ABNORMAL LOW (ref 4.22–5.81)
RDW: 14.6 % (ref 11.5–15.5)
WBC: 5.1 10*3/uL (ref 4.0–10.5)

## 2013-10-05 LAB — GLUCOSE, CAPILLARY
Glucose-Capillary: 97 mg/dL (ref 70–99)
Glucose-Capillary: 110 mg/dL — ABNORMAL HIGH (ref 70–99)

## 2013-10-05 MED ORDER — FERROUS SULFATE 325 (65 FE) MG PO TABS
325.0000 mg | ORAL_TABLET | Freq: Two times a day (BID) | ORAL | Status: DC
  Administered 2013-10-05: 325 mg via ORAL
  Filled 2013-10-05 (×3): qty 1

## 2013-10-05 MED ORDER — CYANOCOBALAMIN 250 MCG PO TABS
250.0000 ug | ORAL_TABLET | Freq: Every day | ORAL | Status: DC
  Administered 2013-10-05: 250 ug via ORAL
  Filled 2013-10-05: qty 1

## 2013-10-05 MED ORDER — DOXYCYCLINE HYCLATE 100 MG PO TABS
100.0000 mg | ORAL_TABLET | Freq: Two times a day (BID) | ORAL | Status: DC
  Administered 2013-10-05: 100 mg via ORAL
  Filled 2013-10-05 (×2): qty 1

## 2013-10-05 MED ORDER — LEVOFLOXACIN 250 MG PO TABS: 250.0000 mg | ORAL_TABLET | Freq: Every day | ORAL | Status: DC

## 2013-10-05 MED ORDER — FERROUS SULFATE 325 (65 FE) MG PO TABS: 325.0000 mg | ORAL_TABLET | Freq: Two times a day (BID) | ORAL | Status: DC

## 2013-10-05 MED ORDER — DOXYCYCLINE HYCLATE 100 MG PO TABS: 100.0000 mg | ORAL_TABLET | Freq: Two times a day (BID) | ORAL | Status: DC

## 2013-10-05 MED ORDER — WARFARIN SODIUM 1 MG PO TABS
1.0000 mg | ORAL_TABLET | Freq: Once | ORAL | Status: DC
  Filled 2013-10-05: qty 1

## 2013-10-05 MED ORDER — RANITIDINE HCL 150 MG PO TABS: 150.0000 mg | ORAL_TABLET | Freq: Every day | ORAL | Status: DC

## 2013-10-05 MED ORDER — CYANOCOBALAMIN 250 MCG PO TABS: 250.0000 ug | ORAL_TABLET | Freq: Every day | ORAL | Status: DC

## 2013-10-05 NOTE — Progress Notes (Signed)
Occupational Therapy Treatment Patient Details Name: [name] MRN: [id number] DOB: 12-04-1925 Today's Date: 10/05/2013    History of present illness 80 y.o. WM PMHx CAD, Atrial fibrillation, HTN, DM2, and AS S/P AVR, and Pacemaker who presented to the ED with complaints of fevers and chills and rigors as well as SOB. Onset was 24 hours prior to presentation. He also had increased pain in his upper left medial thigh area which has been bothering him x 3 weeks but had increased in severity. He denied any trauma to the area or overexertion. In the ED he was found to have hypotension with a blood pressure of 89/39 and an elevated temperature of 102. His Chest X-ray revealed Diffuse Intersititial Opacity. He was placed on IV meds for HCAP coverage.   OT comments  Pt progressing toward goals. Able to perform LB ADLs and functional mobility at min assist level, caregiver independent in assisting.  Pt hopeful to d/c home later today.  Follow Up Recommendations  Home health OT;Supervision/Assistance - 24 hour    Equipment Recommendations  3 in 1 bedside comode    Recommendations for Other Services      Precautions / Restrictions Precautions Precautions: Fall Other Brace/Splint: Left AFO       Mobility Bed Mobility               General bed mobility comments: not assessed- pt sitting EOB  Transfers Overall transfer level: Needs assistance Equipment used: Rolling walker (2 wheeled) Transfers: Sit to/from Stand Sit to Stand: Min assist;From elevated surface         General transfer comment: Assist for power up.    Balance Overall balance assessment: Needs assistance         Standing balance support: No upper extremity supported Standing balance-Leahy Scale: Fair                     ADL Overall ADL's : Needs assistance/impaired                     Lower Body Dressing: Minimal assistance;Sit to/from stand Lower Body Dressing Details (indicate cue  type and reason): Pt donned undergarment, pants and shoes. Toilet Transfer: Minimal assistance;Ambulation;Comfort height toilet   Toileting- Clothing Manipulation and Hygiene: Minimal assistance;Sit to/from stand       Functional mobility during ADLs: Minimal assistance;Rolling walker General ADL Comments: Pt performed LB dressing tasks with min assist and wife independent in assisting.  More difficulty with donning shoes compared to baseline since he is used to sitting in his low chair at home in order to reach his feet.  Pt seemed more confident with mobility once wearing his AFO.      Vision                     Perception     Praxis      Cognition   Behavior During Therapy: WFL for tasks assessed/performed Overall Cognitive Status: Within Functional Limits for tasks assessed                       Extremity/Trunk Assessment               Exercises     Shoulder Instructions       General Comments      Pertinent Vitals/ Pain       Pain Assessment: 0-10 Pain Score: 2  Pain Location: rt groin Pain Intervention(s): Monitored during  session  Home Living                                          Prior Functioning/Environment              Frequency Min 2X/week     Progress Toward Goals  OT Goals(current goals can now be found in the care plan section)  Progress towards OT goals: Progressing toward goals  Acute Rehab OT Goals Patient Stated Goal: To return home with his wife and be independent OT Goal Formulation: With patient/family Time For Goal Achievement: 10/18/13 Potential to Achieve Goals: Good ADL Goals Pt Will Perform Grooming: with supervision;standing Pt Will Perform Lower Body Bathing: with supervision;sit to/from stand Pt Will Perform Lower Body Dressing: with supervision;sit to/from stand Pt Will Transfer to Toilet: with supervision;ambulating;regular height toilet;grab bars Pt Will Perform Toileting -  Clothing Manipulation and hygiene: with supervision;sit to/from stand Pt Will Perform Tub/Shower Transfer: Tub transfer;with supervision;ambulating;rolling walker  Plan Discharge plan remains appropriate    Co-evaluation                 End of Session Equipment Utilized During Treatment: Rolling walker   Activity Tolerance Patient tolerated treatment well   Patient Left with call bell/phone within reach;in bed;with family/visitor present   Nurse Communication Mobility status (IV leaking)        Time: 1330-1403 OT Time Calculation (min): 33 min  Charges: OT General Charges $OT Visit: 1 Procedure OT Treatments $Self Care/Home Management : 23-37 mins  Darrol Jump 10/05/2013, 3:17 PM  10/05/2013 Darrol Jump OTR/L Pager [phone] Office [phone]

## 2013-10-05 NOTE — Discharge Summary (Addendum)
Physician Discharge Summary  [name] QBH:[id number] DOB: 05-12-1925 DOA: 10/02/2013  PCP: Wenda Low, MD  Admit date: 10/02/2013 Discharge date: 10/05/2013  Recommendations for Outpatient Follow-up:  1. Pt will need to follow up with PCP in 2 weeks post discharge 2. Please obtain BMP 3. Please recheck B12 levels at follow up 4. Please check INR on 10/08/13 as pt is on abx which can increase INR   Discharge Diagnoses:  Sepsis  -source most likely RLE cellulitis  -less impressed with CXR findings--clinically does not appear to have pneumonia -cont supportive care and empiric anbx's  Cellulitis of right lower extremity with bilateral LE edema  -signficantly improved on abx -cot empiric anbx's  -LE Duplex preliminary without evidence of DVT  -WBC improved from 13.0 to 5.1 on the day of discharge -pt will be discharge with doxycycline for 7 more days which will complete 10 days of therapy Hypotension  -due to sepsis and relative volume depletion  -cont IVFs but decrease rate  -continue to hold anti HTN agents and diuretics  -Patient's blood pressure stabilized--restart his low-dose ramipril 2.5 mg after discharge  Acute respiratory failure with hypoxia / ?Atypical pneumonia  -could be viral etiology but no definitive focal opacities  -CXR actually not significantly different than previous films  -pt will be d/ced with 3 more days levofloxacin 250mg  daily to complete 7 days of therapy CKD stage 3 -baseline renal function 1.3-1.5 Iron deficiency anemia  -Fe <10% -given 1x dose IV Iron; start ferrous sulfate  -B12  (249) was also suboptimal so replete; patient will continue B12 supplementation PO after discharge--please recheck B12 levels at followup  Warfarin-induced coagulopathy  -pharmacy managing Warfarin  -no signs of active bleeding  Pain left leg  -XRs unremarkable  Permanent Atrial fibrillation  -V paced  -Rate controlled  -Continue warfarin  Coronary artery  disease/aortic stenosis with history of bioprosthetic aortic valve replacement  -History of CABG x1 vessel  Continue Zocor-  Diabetes mellitus  -hgb A1C 6.9  -Continue NovoLog sliding scale -Discontinue metformin after discharge as the patient's estimated creatinine clearance is approximately 40 -The patient will continue on Amaryl -The patient will followup with his primary care provider for further adjustments Deconditioning -Home health physical therapy and occupational therapy were set up  Discharge Condition: stable  Disposition: home Follow-up Information   Follow up with [facility]. (HHPT, rolling walker, bedside commode)    Contact information:   [place] [address] [phone]       Diet:carbohydrate modified Wt Readings from Last 3 Encounters:  10/02/13 89.8 kg (197 lb 15.6 oz)  08/06/13 92.534 kg (204 lb)  01/04/13 92.897 kg (204 lb 12.8 oz)    History of present illness:  80 y.o. M Hx CAD, Atrial fibrillation, HTN, DM2, and AS S/P AVR, and Pacemaker who presented to the ED with complaints of fevers and chills and rigors as well as SOB. Onset was 24 hours prior to presentation. He also had increased pain in his upper left medial thigh area which has been bothering him x 3 weeks but had increased in severity. He denied any trauma to the area or overexertion.  In the ED he was found to have hypotension with a blood pressure of 89/39 and an elevated temperature of 102. His Chest X-ray revealed Diffuse Intersititial Opacity. He was placed on IV Vancomycin and Zosyn for HCAP coverage. Upon further clarification, the patient has not been hospitalized for over 6 months. In addition, the  patient clinically did not present with pneumonia. In comparison with his prior chest x-rays, his present chest x-ray was not much changed. The patient was initially moved to step down unit because of the soft blood pressures. He was fluid resuscitated.  He clinically improved.  He defervesced and his blood pressure improved. The patient was maintained on his warfarin with pharmacy assistance dose. It was noted that his B12 level was low at 249. The patient was supplemented with B12. Will be discharged on oral B12 will followup with his primary care provider to recheck levels. Iron saturation was 4%. The patient was given a dose of intravenous iron and start her on oral iron. The patient was switched to oral doxycycline to treat his cellulitis and oral Levaquin to finish treatment for his presumptive pneumonia.     Consultants: none  Discharge Exam: Filed Vitals:   10/05/13 0345  BP: 110/61  Pulse: 63  Temp: 97.5 F (36.4 C)  Resp: 18   Filed Vitals:   10/04/13 1324 10/04/13 1933 10/04/13 2330 10/05/13 0345  BP: 127/63 93/57 128/72 110/61  Pulse: 66 60 61 63  Temp: 98.5 F (36.9 C) 98.1 F (36.7 C) 97.5 F (36.4 C) 97.5 F (36.4 C)  TempSrc: Oral Oral Oral Oral  Resp: 20 15 15 18   Height:      Weight:      SpO2: 100% 96% 92% 94%   General: A&O x 3, NAD, pleasant, cooperative Cardiovascular: RRR, no rub, no gallop, no S3 Respiratory: bibasilar crackles. No wheezing. Abdomen:soft, nontender, nondistended, positive bowel sounds Extremities: 1+LE R>L edema, No lymphangitis, no petechiae  Discharge Instructions      Discharge Instructions   Diet - low sodium heart healthy    Complete by:  As directed      Increase activity slowly    Complete by:  As directed             Medication List    STOP taking these medications       metFORMIN 500 MG tablet  Commonly known as:  GLUCOPHAGE      TAKE these medications       co-enzyme Q-10 30 MG capsule  Take 100 mg by mouth daily.     doxycycline 100 MG tablet  Commonly known as:  VIBRA-TABS  Take 1 tablet (100 mg total) by mouth every 12 (twelve) hours.     ferrous sulfate 325 (65 FE) MG tablet  Take 1 tablet (325 mg total) by mouth 2 (two) times daily with a  meal.     furosemide 20 MG tablet  Commonly known as:  LASIX  Take 20 mg by mouth every other day.     glimepiride 1 MG tablet  Commonly known as:  AMARYL  Take 1 mg by mouth daily before breakfast.     levofloxacin 250 MG tablet  Commonly known as:  LEVAQUIN  Take 1 tablet (250 mg total) by mouth daily.     nitroGLYCERIN 0.4 MG SL tablet  Commonly known as:  NITROSTAT  Place 1 tablet (0.4 mg total) under the tongue every 5 (five) minutes x 3 doses as needed for chest pain.     ramipril 2.5 MG capsule  Commonly known as:  ALTACE  Take 2.5 mg by mouth daily.     ranitidine 150 MG tablet  Commonly known as:  ZANTAC  Take 1 tablet (150 mg total) by mouth at bedtime.     simvastatin 20 MG tablet  Commonly  known as:  ZOCOR  Take 20 mg by mouth daily.     vitamin B-12 250 MCG tablet  Commonly known as:  CYANOCOBALAMIN  Take 1 tablet (250 mcg total) by mouth daily.     warfarin 5 MG tablet  Commonly known as:  COUMADIN  Take 2.5 mg by mouth daily.         The results of significant diagnostics from this hospitalization (including imaging, microbiology, ancillary and laboratory) are listed below for reference.    Significant Diagnostic Studies: Dg Hip Complete Right  10/02/2013   CLINICAL DATA:  No acute bony findings.  EXAM: RIGHT HIP - COMPLETE 2+ VIEW; RIGHT FEMUR - 2 VIEW  COMPARISON:  None.  FINDINGS: Both hips are normally located. No acute hip fracture or plain film evidence of avascular necrosis. There is a benign-appearing sclerotic lesion in the intertrochanteric region on the left hip which is likely a benign bone island. The pubic symphysis and SI joints are intact. No pelvic fractures are identified.  IMPRESSION: The femur is intact. The knee joint is intact. Vascular calcifications are noted.   Electronically Signed   By: Kalman Jewels M.D.   On: 10/02/2013 09:53   Dg Femur Right  10/02/2013   CLINICAL DATA:  No acute bony findings.  EXAM: RIGHT HIP - COMPLETE  2+ VIEW; RIGHT FEMUR - 2 VIEW  COMPARISON:  None.  FINDINGS: Both hips are normally located. No acute hip fracture or plain film evidence of avascular necrosis. There is a benign-appearing sclerotic lesion in the intertrochanteric region on the left hip which is likely a benign bone island. The pubic symphysis and SI joints are intact. No pelvic fractures are identified.  IMPRESSION: The femur is intact. The knee joint is intact. Vascular calcifications are noted.   Electronically Signed   By: Kalman Jewels M.D.   On: 10/02/2013 09:53   Dg Chest Port 1 View  (if Code Sepsis Called)  10/02/2013   CLINICAL DATA:  Shortness of breath.  Fever.  EXAM: PORTABLE CHEST - 1 VIEW  COMPARISON:  10/05/2012  FINDINGS: Chronic cardiomegaly. The patient is status post CABG and aortic valve replacement.  There is chronic elevation of the left diaphragm, sometimes related to previous sternotomy.  Diffuse interstitial coarsening, especially at the bases. Scarring at the left base that is chronic. No definitive effusion. No pneumothorax.  Single chamber left approach pacer wire has a unchanged appearance.  IMPRESSION: Diffuse interstitial opacity which is likely congestive. In the setting of fever, atypical infection cannot be excluded.   Electronically Signed   By: Jorje Guild M.D.   On: 10/02/2013 01:08     Microbiology: Recent Results (from the past 240 hour(s))  CULTURE, BLOOD (ROUTINE X 2)     Status: None   Collection Time    10/02/13 12:25 AM      Result Value Ref Range Status   Specimen Description BLOOD LEFT ARM   Final   Special Requests BOTTLES DRAWN AEROBIC AND ANAEROBIC 10CC EACH   Final   Culture  Setup Time     Final   Value: 10/02/2013 08:21     Performed at [facility]   Culture     Final   Value:        BLOOD CULTURE RECEIVED NO GROWTH TO DATE CULTURE WILL BE HELD FOR 5 DAYS BEFORE ISSUING A FINAL NEGATIVE REPORT     Performed at [facility]   Report Status PENDING  Incomplete  CULTURE, BLOOD (ROUTINE X 2)     Status: None   Collection Time    10/02/13 12:30 AM      Result Value Ref Range Status   Specimen Description BLOOD LEFT HAND   Final   Special Requests BOTTLES DRAWN AEROBIC ONLY 10CC   Final   Culture  Setup Time     Final   Value: 10/02/2013 08:21     Performed at [facility]   Culture     Final   Value:        BLOOD CULTURE RECEIVED NO GROWTH TO DATE CULTURE WILL BE HELD FOR 5 DAYS BEFORE ISSUING A FINAL NEGATIVE REPORT     Performed at [facility]   Report Status PENDING   Incomplete  URINE CULTURE     Status: None   Collection Time    10/02/13  1:52 AM      Result Value Ref Range Status   Specimen Description URINE, CLEAN CATCH   Final   Special Requests NONE   Final   Culture  Setup Time     Final   Value: 10/02/2013 02:21     Performed at [place]     Final   Value: 9,000 COLONIES/ML     Performed at [facility]   Culture     Final   Value: INSIGNIFICANT GROWTH     Performed at [facility]   Report Status 10/03/2013 FINAL   Final  MRSA PCR SCREENING     Status: None   Collection Time    10/02/13  4:43 AM      Result Value Ref Range Status   MRSA by PCR NEGATIVE  NEGATIVE Final   Comment:            The GeneXpert MRSA Assay (FDA     approved for NASAL specimens     only), is one component of a     comprehensive MRSA colonization     surveillance program. It is not     intended to diagnose MRSA     infection nor to guide or     monitor treatment for     MRSA infections.     Labs: Basic Metabolic Panel:  Recent Labs Lab 10/02/13 0031 10/02/13 1158 10/03/13 0253 10/04/13 0210 10/05/13 0558  NA 140 139 141 139 142  K 4.7 5.0 5.0 4.0 4.3  CL 102 104 104 105 105  CO2 24 23 26 21 24   GLUCOSE 140* 172* 118* 98 116*  BUN 26* 27* 26* 21 17  CREATININE 1.40* 1.55* 1.60* 1.32 1.30  CALCIUM 9.0 8.5 8.3* 8.1* 8.5   Liver Function Tests: No results  found for this basename: AST, ALT, ALKPHOS, BILITOT, PROT, ALBUMIN,  in the last 168 hours No results found for this basename: LIPASE, AMYLASE,  in the last 168 hours No results found for this basename: AMMONIA,  in the last 168 hours CBC:  Recent Labs Lab 10/02/13 0031 10/02/13 1158 10/03/13 0253 10/04/13 0210 10/05/13 0558  WBC 11.9* 13.0* 9.8 6.1 5.1  NEUTROABS 10.6*  --   --   --   --   HGB 11.4* 11.0* 10.5* 10.3* 10.5*  HCT 35.7* 34.3* 33.4* 31.8* 33.2*  MCV 96.7 98.8 98.2 97.2 96.8  PLT 184 166 175 157 183   Cardiac Enzymes:  Recent Labs Lab 10/02/13 1158  CKTOTAL 77   BNP: No components found with this  basename: POCBNP,  CBG:  Recent Labs Lab 10/04/13 0833 10/04/13 1257 10/04/13 1712 10/04/13 2129 10/05/13 0754  GLUCAP 93 126* 130* 142* 97    Time coordinating discharge:  Greater than 30 minutes  Signed:  Trek Kimball, DO [facility] Pager: [phone] 10/05/2013, 9:09 AM

## 2013-10-05 NOTE — Progress Notes (Signed)
Patient discharge teaching given, including activity, diet, follow-up appoints, and medications. Patient verbalized understanding of all discharge instructions. IV access was d/c'd. Vitals are stable. Skin is intact except as charted in most recent assessments. Pt to be escorted out by NT, to be driven home by family. 

## 2013-10-05 NOTE — Progress Notes (Signed)
[place] for coumadin Indication: atrial fibrillation  Allergies  Allergen Reactions  . Demerol [Meperidine] Other (See Comments)    "hallucinations"    Patient Measurements: Height: 5\' 9"  (175.3 cm) Weight: 197 lb 15.6 oz (89.8 kg) IBW/kg (Calculated) : 70.7   Vital Signs: Temp: 97.7 F (36.5 C) (08/08 1005) Temp src: Oral (08/08 1005) BP: 111/58 mmHg (08/08 1005) Pulse Rate: 71 (08/08 1005)  Labs:  Recent Labs  10/02/13 1158 10/03/13 0253 10/04/13 0210 10/05/13 0558  HGB 11.0* 10.5* 10.3* 10.5*  HCT 34.3* 33.4* 31.8* 33.2*  PLT 166 175 157 183  LABPROT  --  32.3* 31.2* 30.4*  INR  --  3.14* 3.01* 2.91*  CREATININE 1.55* 1.60* 1.32 1.30  CKTOTAL 77  --   --   --     Estimated Creatinine Clearance: 46 ml/min (by C-G formula based on Cr of 1.3).   Medical History: Past Medical History  Diagnosis Date  . PVD (peripheral vascular disease)   . Permanent atrial fibrillation   . DVT (deep venous thrombosis)   . Coronary artery disease   . Aortic stenosis   . GERD (gastroesophageal reflux disease)   . BPH (benign prostatic hypertrophy)   . Kidney stones   . Diverticulosis   . Shortness of breath   . Diabetes mellitus without complication     type 2  . 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 Gout   . Dermatophytosis of nail   . Gastroesophageal reflux disease     Assessment: 80 yo male on abx D#4 for r/o sepsis, cellulitis also on coumadin PTA for afib. Pharmacy consulted to dose. Given coumadin 1mg  dose on 8/7 (doses held since 8/5). INR now therapeutic 2.91 (from 3.01 on 8/7). LE doppler neg for DVT. Hg low stable, plts wnl. Doxy added 8/8.  No bleeding documented.  Home coumadin dose: 2.5mg /day (last taken pta 10/01/13)  Goal of Therapy:  INR 2-3 Monitor platelets by anticoagulation protocol: Yes   Plan:  -Coumadin 1 mg x 1  dose @1800  (may not be enough but doxy added 8/8 may affect INR) -Daily PT/INR -Monitor for bleeding  Elicia Lamp, PharmD Clinical Pharmacist - Resident Pager [phone] 10/05/2013 11:31 AM

## 2013-10-07 ENCOUNTER — Ambulatory Visit (INDEPENDENT_AMBULATORY_CARE_PROVIDER_SITE_OTHER): Payer: Medicare Other | Admitting: *Deleted

## 2013-10-07 DIAGNOSIS — Z5181 Encounter for therapeutic drug level monitoring: Secondary | ICD-10-CM | POA: Diagnosis not present

## 2013-10-07 DIAGNOSIS — I4891 Unspecified atrial fibrillation: Secondary | ICD-10-CM

## 2013-10-07 LAB — POCT INR: INR: 2.8

## 2013-10-08 LAB — CULTURE, BLOOD (ROUTINE X 2)
Culture: NO GROWTH
Culture: NO GROWTH

## 2013-10-15 DIAGNOSIS — M5430 Sciatica, unspecified side: Secondary | ICD-10-CM | POA: Diagnosis not present

## 2013-10-15 DIAGNOSIS — IMO0002 Reserved for concepts with insufficient information to code with codable children: Secondary | ICD-10-CM | POA: Diagnosis not present

## 2013-10-15 DIAGNOSIS — M5137 Other intervertebral disc degeneration, lumbosacral region: Secondary | ICD-10-CM | POA: Diagnosis not present

## 2013-10-15 DIAGNOSIS — M79609 Pain in unspecified limb: Secondary | ICD-10-CM | POA: Diagnosis not present

## 2013-10-15 DIAGNOSIS — M4800 Spinal stenosis, site unspecified: Secondary | ICD-10-CM | POA: Diagnosis not present

## 2013-10-16 DIAGNOSIS — J189 Pneumonia, unspecified organism: Secondary | ICD-10-CM | POA: Diagnosis not present

## 2013-10-16 DIAGNOSIS — K222 Esophageal obstruction: Secondary | ICD-10-CM | POA: Diagnosis not present

## 2013-10-16 DIAGNOSIS — D649 Anemia, unspecified: Secondary | ICD-10-CM | POA: Diagnosis not present

## 2013-10-16 DIAGNOSIS — E782 Mixed hyperlipidemia: Secondary | ICD-10-CM | POA: Diagnosis not present

## 2013-10-16 DIAGNOSIS — N183 Chronic kidney disease, stage 3 unspecified: Secondary | ICD-10-CM | POA: Diagnosis not present

## 2013-10-16 DIAGNOSIS — R1310 Dysphagia, unspecified: Secondary | ICD-10-CM | POA: Diagnosis not present

## 2013-10-18 ENCOUNTER — Ambulatory Visit (INDEPENDENT_AMBULATORY_CARE_PROVIDER_SITE_OTHER): Payer: Medicare Other | Admitting: Podiatry

## 2013-10-18 DIAGNOSIS — M79676 Pain in unspecified toe(s): Secondary | ICD-10-CM

## 2013-10-18 DIAGNOSIS — B351 Tinea unguium: Secondary | ICD-10-CM

## 2013-10-18 DIAGNOSIS — M79609 Pain in unspecified limb: Secondary | ICD-10-CM

## 2013-10-20 NOTE — Progress Notes (Signed)
Subjective:     Patient ID: [name], male   DOB: 06/22/1925, 80 y.o.   MRN: [id number]  HPI patient states I need to get my nails cut on my feet   Review of Systems     Objective:   Physical Exam Neurovascular status intact with muscle strength adequate and range of motion within normal limits. 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

## 2013-10-21 ENCOUNTER — Other Ambulatory Visit: Payer: Self-pay | Admitting: *Deleted

## 2013-10-21 MED ORDER — WARFARIN SODIUM 5 MG PO TABS: ORAL_TABLET | ORAL | Status: DC

## 2013-10-21 NOTE — Telephone Encounter (Signed)
Refill done as requested 

## 2013-10-22 ENCOUNTER — Ambulatory Visit (INDEPENDENT_AMBULATORY_CARE_PROVIDER_SITE_OTHER): Payer: Medicare Other | Admitting: *Deleted

## 2013-10-22 DIAGNOSIS — Z5181 Encounter for therapeutic drug level monitoring: Secondary | ICD-10-CM | POA: Diagnosis not present

## 2013-10-22 DIAGNOSIS — I4891 Unspecified atrial fibrillation: Secondary | ICD-10-CM | POA: Diagnosis not present

## 2013-10-22 LAB — POCT INR: INR: 4

## 2013-11-01 DIAGNOSIS — M5137 Other intervertebral disc degeneration, lumbosacral region: Secondary | ICD-10-CM | POA: Diagnosis not present

## 2013-11-01 DIAGNOSIS — M48061 Spinal stenosis, lumbar region without neurogenic claudication: Secondary | ICD-10-CM | POA: Diagnosis not present

## 2013-11-01 DIAGNOSIS — IMO0002 Reserved for concepts with insufficient information to code with codable children: Secondary | ICD-10-CM | POA: Diagnosis not present

## 2013-11-01 DIAGNOSIS — M79609 Pain in unspecified limb: Secondary | ICD-10-CM | POA: Diagnosis not present

## 2013-11-05 ENCOUNTER — Ambulatory Visit (INDEPENDENT_AMBULATORY_CARE_PROVIDER_SITE_OTHER): Payer: Medicare Other | Admitting: *Deleted

## 2013-11-05 DIAGNOSIS — Z5181 Encounter for therapeutic drug level monitoring: Secondary | ICD-10-CM

## 2013-11-05 DIAGNOSIS — I4891 Unspecified atrial fibrillation: Secondary | ICD-10-CM

## 2013-11-05 LAB — POCT INR: INR: 2.8

## 2013-11-06 ENCOUNTER — Other Ambulatory Visit: Payer: Self-pay | Admitting: Gastroenterology

## 2013-11-06 ENCOUNTER — Other Ambulatory Visit: Payer: Self-pay

## 2013-11-06 DIAGNOSIS — R1310 Dysphagia, unspecified: Secondary | ICD-10-CM | POA: Diagnosis not present

## 2013-11-06 MED ORDER — RAMIPRIL 2.5 MG PO CAPS: 2.5000 mg | ORAL_CAPSULE | Freq: Every day | ORAL | Status: DC

## 2013-11-08 DIAGNOSIS — G609 Hereditary and idiopathic neuropathy, unspecified: Secondary | ICD-10-CM | POA: Diagnosis not present

## 2013-11-08 DIAGNOSIS — I4892 Unspecified atrial flutter: Secondary | ICD-10-CM | POA: Diagnosis not present

## 2013-11-08 DIAGNOSIS — N183 Chronic kidney disease, stage 3 unspecified: Secondary | ICD-10-CM | POA: Diagnosis not present

## 2013-11-08 DIAGNOSIS — I359 Nonrheumatic aortic valve disorder, unspecified: Secondary | ICD-10-CM | POA: Diagnosis not present

## 2013-11-08 DIAGNOSIS — E1149 Type 2 diabetes mellitus with other diabetic neurological complication: Secondary | ICD-10-CM | POA: Diagnosis not present

## 2013-11-08 DIAGNOSIS — E782 Mixed hyperlipidemia: Secondary | ICD-10-CM | POA: Diagnosis not present

## 2013-11-08 DIAGNOSIS — Z1331 Encounter for screening for depression: Secondary | ICD-10-CM | POA: Diagnosis not present

## 2013-11-08 DIAGNOSIS — Z0000 Encounter for general adult medical examination without abnormal findings: Secondary | ICD-10-CM | POA: Diagnosis not present

## 2013-11-08 DIAGNOSIS — I2510 Atherosclerotic heart disease of native coronary artery without angina pectoris: Secondary | ICD-10-CM | POA: Diagnosis not present

## 2013-11-08 DIAGNOSIS — I10 Essential (primary) hypertension: Secondary | ICD-10-CM | POA: Diagnosis not present

## 2013-11-12 ENCOUNTER — Other Ambulatory Visit: Payer: Medicare Other

## 2013-11-13 ENCOUNTER — Ambulatory Visit
Admission: RE | Admit: 2013-11-13 | Discharge: 2013-11-13 | Disposition: A | Discharge status: Home / Self Care | Payer: Medicare Other | Source: Ambulatory Visit | Attending: Gastroenterology | Admitting: Gastroenterology

## 2013-11-13 DIAGNOSIS — R1310 Dysphagia, unspecified: Secondary | ICD-10-CM

## 2013-11-13 DIAGNOSIS — K449 Diaphragmatic hernia without obstruction or gangrene: Secondary | ICD-10-CM | POA: Diagnosis not present

## 2013-11-19 ENCOUNTER — Encounter: Payer: Self-pay | Admitting: Internal Medicine

## 2013-11-25 ENCOUNTER — Other Ambulatory Visit: Payer: Self-pay | Admitting: Gastroenterology

## 2013-11-25 DIAGNOSIS — K222 Esophageal obstruction: Secondary | ICD-10-CM | POA: Diagnosis not present

## 2013-11-26 ENCOUNTER — Other Ambulatory Visit: Payer: Self-pay

## 2013-11-26 MED ORDER — SIMVASTATIN 20 MG PO TABS: 20.0000 mg | ORAL_TABLET | Freq: Every day | ORAL | Status: DC

## 2013-11-27 ENCOUNTER — Encounter: Payer: PRIVATE HEALTH INSURANCE | Admitting: Internal Medicine

## 2013-11-27 ENCOUNTER — Ambulatory Visit (INDEPENDENT_AMBULATORY_CARE_PROVIDER_SITE_OTHER): Payer: Medicare Other | Admitting: Pharmacist

## 2013-11-27 DIAGNOSIS — Z5181 Encounter for therapeutic drug level monitoring: Secondary | ICD-10-CM

## 2013-11-27 DIAGNOSIS — I4891 Unspecified atrial fibrillation: Secondary | ICD-10-CM | POA: Diagnosis not present

## 2013-11-27 LAB — POCT INR: INR: 2.4

## 2013-11-28 DIAGNOSIS — M5441 Lumbago with sciatica, right side: Secondary | ICD-10-CM | POA: Diagnosis not present

## 2013-11-28 DIAGNOSIS — M79604 Pain in right leg: Secondary | ICD-10-CM | POA: Diagnosis not present

## 2013-11-28 DIAGNOSIS — M5416 Radiculopathy, lumbar region: Secondary | ICD-10-CM | POA: Diagnosis not present

## 2013-11-28 DIAGNOSIS — M4800 Spinal stenosis, site unspecified: Secondary | ICD-10-CM | POA: Diagnosis not present

## 2013-11-29 DIAGNOSIS — M5441 Lumbago with sciatica, right side: Secondary | ICD-10-CM | POA: Diagnosis not present

## 2013-12-02 ENCOUNTER — Telehealth: Payer: Self-pay | Admitting: Pharmacist

## 2013-12-02 NOTE — Telephone Encounter (Signed)
Message copied by Aris Georgia on Mon Dec 02, 2013 10:01 AM ------      Message from: Jettie Booze      Created: Thu Nov 28, 2013  5:17 PM       I would not bridge with lovenox.      ----- Message -----         From: Aris Georgia, [place]: 11/27/2013   3:02 PM           To: Jettie Booze, MD            Pt to have esophageal stretch procedure on 12/16/2013.  Pt instructed to hold Coumadin prior to procedure beginning on 12/12/2013.  Per pt, no instructions regarding possible bridge with Lovenox.  Please let us know if you would like [place] to contact pt with instructions for Lovenox bridge or not.  CHADS2 = 3.       ------

## 2013-12-05 DIAGNOSIS — M5441 Lumbago with sciatica, right side: Secondary | ICD-10-CM | POA: Diagnosis not present

## 2013-12-16 ENCOUNTER — Encounter (HOSPITAL_COMMUNITY)
Admission: RE | Disposition: A | Discharge status: Home / Self Care | Payer: Self-pay | Source: Ambulatory Visit | Attending: Gastroenterology

## 2013-12-16 ENCOUNTER — Encounter (HOSPITAL_COMMUNITY): Payer: Self-pay | Admitting: *Deleted

## 2013-12-16 ENCOUNTER — Ambulatory Visit (HOSPITAL_COMMUNITY)
Admission: RE | Admit: 2013-12-16 | Discharge: 2013-12-16 | Disposition: A | Discharge status: Home / Self Care | Payer: Medicare Other | Source: Ambulatory Visit | Attending: Gastroenterology | Admitting: Gastroenterology

## 2013-12-16 DIAGNOSIS — M109 Gout, unspecified: Secondary | ICD-10-CM | POA: Insufficient documentation

## 2013-12-16 DIAGNOSIS — I2510 Atherosclerotic heart disease of native coronary artery without angina pectoris: Secondary | ICD-10-CM | POA: Diagnosis not present

## 2013-12-16 DIAGNOSIS — K222 Esophageal obstruction: Secondary | ICD-10-CM | POA: Diagnosis not present

## 2013-12-16 DIAGNOSIS — E780 Pure hypercholesterolemia: Secondary | ICD-10-CM | POA: Diagnosis not present

## 2013-12-16 DIAGNOSIS — Z951 Presence of aortocoronary bypass graft: Secondary | ICD-10-CM | POA: Insufficient documentation

## 2013-12-16 DIAGNOSIS — K219 Gastro-esophageal reflux disease without esophagitis: Secondary | ICD-10-CM | POA: Diagnosis not present

## 2013-12-16 DIAGNOSIS — K449 Diaphragmatic hernia without obstruction or gangrene: Secondary | ICD-10-CM | POA: Insufficient documentation

## 2013-12-16 DIAGNOSIS — M1990 Unspecified osteoarthritis, unspecified site: Secondary | ICD-10-CM | POA: Diagnosis not present

## 2013-12-16 DIAGNOSIS — I4891 Unspecified atrial fibrillation: Secondary | ICD-10-CM | POA: Diagnosis not present

## 2013-12-16 DIAGNOSIS — I129 Hypertensive chronic kidney disease with stage 1 through stage 4 chronic kidney disease, or unspecified chronic kidney disease: Secondary | ICD-10-CM | POA: Insufficient documentation

## 2013-12-16 DIAGNOSIS — Z87442 Personal history of urinary calculi: Secondary | ICD-10-CM | POA: Insufficient documentation

## 2013-12-16 DIAGNOSIS — Z7901 Long term (current) use of anticoagulants: Secondary | ICD-10-CM | POA: Insufficient documentation

## 2013-12-16 DIAGNOSIS — E1122 Type 2 diabetes mellitus with diabetic chronic kidney disease: Secondary | ICD-10-CM | POA: Diagnosis not present

## 2013-12-16 DIAGNOSIS — Z950 Presence of cardiac pacemaker: Secondary | ICD-10-CM | POA: Diagnosis not present

## 2013-12-16 DIAGNOSIS — K5792 Diverticulitis of intestine, part unspecified, without perforation or abscess without bleeding: Secondary | ICD-10-CM | POA: Diagnosis not present

## 2013-12-16 DIAGNOSIS — N183 Chronic kidney disease, stage 3 (moderate): Secondary | ICD-10-CM | POA: Diagnosis not present

## 2013-12-16 DIAGNOSIS — Z952 Presence of prosthetic heart valve: Secondary | ICD-10-CM | POA: Diagnosis not present

## 2013-12-16 HISTORY — PX: ESOPHAGOGASTRODUODENOSCOPY: SHX5428

## 2013-12-16 HISTORY — PX: BALLOON DILATION: SHX5330

## 2013-12-16 LAB — GLUCOSE, CAPILLARY: Glucose-Capillary: 114 mg/dL — ABNORMAL HIGH (ref 70–99)

## 2013-12-16 SURGERY — EGD (ESOPHAGOGASTRODUODENOSCOPY)
Anesthesia: Moderate Sedation

## 2013-12-16 MED ORDER — SODIUM CHLORIDE 0.9 % IV SOLN
INTRAVENOUS | Status: DC
  Administered 2013-12-16: 500 mL via INTRAVENOUS

## 2013-12-16 MED ORDER — BUTAMBEN-TETRACAINE-BENZOCAINE 2-2-14 % EX AERO
INHALATION_SPRAY | CUTANEOUS | Status: DC | PRN
  Administered 2013-12-16: 1 via TOPICAL

## 2013-12-16 MED ORDER — MIDAZOLAM HCL 10 MG/2ML IJ SOLN
INTRAMUSCULAR | Status: DC | PRN
  Administered 2013-12-16 (×2): 2 mg via INTRAVENOUS

## 2013-12-16 MED ORDER — MIDAZOLAM HCL 10 MG/2ML IJ SOLN
INTRAMUSCULAR | Status: AC
  Filled 2013-12-16: qty 2

## 2013-12-16 MED ORDER — FENTANYL CITRATE 0.05 MG/ML IJ SOLN
INTRAMUSCULAR | Status: AC
  Filled 2013-12-16: qty 2

## 2013-12-16 MED ORDER — DIPHENHYDRAMINE HCL 50 MG/ML IJ SOLN
INTRAMUSCULAR | Status: AC
  Filled 2013-12-16: qty 1

## 2013-12-16 MED ORDER — FENTANYL CITRATE 0.05 MG/ML IJ SOLN
INTRAMUSCULAR | Status: DC | PRN
  Administered 2013-12-16: 25 ug via INTRAVENOUS

## 2013-12-16 NOTE — H&P (Signed)
  Problem: Esophageal dysphagia. Barium esophagram showed a distal esophageal stricture. Chronic Coumadin anticoagulation to treat chronic atrial fibrillation. Esophageal stricture dilated in 2000.  History: The patient is an 80 year old male born [birth year]. He underwent esophageal stricture dilation in 2000. He has developed intermittent solid food esophageal dysphagia. Barium esophagram showed a distal esophageal stricture.   The patient is scheduled to undergo diagnostic esophagogastroduodenoscopy with esophageal stricture dilation.  Past medical history: Hypertension. Chronic atrial fibrillation requiring chronic Coumadin anticoagulation. Aortic valve replacement surgery using tissue valve. Type 2 diabetes mellitus with stage 3 chronic kidney disease. Hypercholesterolemia. Kidney stone. Gastroesophageal reflux associated with a hiatal hernia and benign distal esophageal stricture. Colonic diverticulosis. Gastric ulcer diagnosed in 1982. Osteoarthritis. Lumbar degenerative disc disease with L5 nerve root compression. Coronary artery disease. Gout. Cardiac pacemaker for bradycardia. Coronary artery bypass grafting.   Medication allergies: Demerol  Exam: The patient is alert and lying comfortably on the endoscopy stretcher. Abdomen is soft and nontender to palpation. Cardiac exam reveals an irregular rhythm; patient has chronic atrial fibrillation. Lungs are clear to auscultation.  Plan: Proceed with diagnostic esophagogastroduodenoscopy with distal esophageal stricture dilation

## 2013-12-16 NOTE — Op Note (Signed)
Procedure: Diagnostic esophagogastroduodenoscopy with esophageal balloon dilation at the esophagogastric junction. Barium esophagram showed a tortuous esophagus with short segment stricture at the esophagogastric junction  Endoscopist: Earle Gell  Premedication: Versed 4 mg. Fentanyl 25 mcg.  Procedure: The patient was placed in the left lateral decubitus position on the fluoroscopy table. The Pentax gastroscope was passed through the posterior hypopharynx into the proximal esophagus without difficulty. The hypopharynx, larynx, and vocal cords appeared normal.  Esophagoscopy: The proximal, mid, and lower segments of the esophageal mucosa appeared normal. The esophagus was quite tortuous throughout its length. An esophageal stricture was not identified. The squamocolumnar junction was regular.  Gastroscopy: Retroflex view of the gastric cardia and fundus was normal. A large hiatal hernia was present. The gastric body, antrum, and pylorus appeared normal.  Duodenoscopy: The duodenal bulb and descending duodenum appeared normal.  Esophageal balloon dilation. The esophageal balloon was situated at the esophagogastric junction and slowly inflated from 15 mm to 18 mm. Post esophageal balloon dilation the mucosa was inspected and was not disrupted indicating the absence of an esophageal stricture. I cannot rule out achalasia.  Assessment: Large hiatal hernia. Tortuous esophagus. Otherwise normal esophagogastroduodenoscopy. No evidence of esophageal stricture formation.  Recommendation: If dysphagia persists post esophageal dilation, scheduled diagnostic high-resolution esophageal manometry at [facility] endoscopy suite to look for achalasia.

## 2013-12-17 ENCOUNTER — Encounter (HOSPITAL_COMMUNITY): Payer: Self-pay | Admitting: Gastroenterology

## 2013-12-17 ENCOUNTER — Other Ambulatory Visit: Payer: Self-pay

## 2013-12-18 ENCOUNTER — Encounter: Payer: Self-pay | Admitting: Internal Medicine

## 2013-12-18 ENCOUNTER — Ambulatory Visit (INDEPENDENT_AMBULATORY_CARE_PROVIDER_SITE_OTHER): Payer: Medicare Other | Admitting: Internal Medicine

## 2013-12-18 VITALS — BP 120/72 | HR 78 | Ht 69.5 in | Wt 201.4 lb

## 2013-12-18 DIAGNOSIS — I482 Chronic atrial fibrillation, unspecified: Secondary | ICD-10-CM

## 2013-12-18 DIAGNOSIS — I10 Essential (primary) hypertension: Secondary | ICD-10-CM | POA: Diagnosis not present

## 2013-12-18 DIAGNOSIS — R001 Bradycardia, unspecified: Secondary | ICD-10-CM

## 2013-12-18 DIAGNOSIS — I2510 Atherosclerotic heart disease of native coronary artery without angina pectoris: Secondary | ICD-10-CM | POA: Diagnosis not present

## 2013-12-18 LAB — MDC_IDC_ENUM_SESS_TYPE_INCLINIC
Battery Impedance: 256 Ohm
Battery Voltage: 2.78 V
Date Time Interrogation Session: 20151021103142
Lead Channel Impedance Value: 0 Ohm
Lead Channel Impedance Value: 557 Ohm
Lead Channel Pacing Threshold Pulse Width: 0.4 ms
Lead Channel Sensing Intrinsic Amplitude: 4 mV
Lead Channel Setting Pacing Pulse Width: 0.4 ms
Lead Channel Setting Sensing Sensitivity: 2 mV
MDC IDC MSMT BATTERY REMAINING LONGEVITY: 97 mo
MDC IDC MSMT LEADCHNL RV PACING THRESHOLD AMPLITUDE: 0.5 V
MDC IDC SET LEADCHNL RV PACING AMPLITUDE: 2.5 V
MDC IDC STAT BRADY RV PERCENT PACED: 96 %

## 2013-12-18 NOTE — Progress Notes (Signed)
Primary Cardiologist:  Dr [name] is a 80 y.o. male who presents today for routine electrophysiology followup.  Since his last visit, the patient reports doing very well.  Today, he denies symptoms of palpitations, chest pain, presyncope, or syncope.  Shortness of breath, lower extremity edema, and dizziness are stable.  The patient is otherwise without complaint today.   Past Medical History  Diagnosis Date  . PVD (peripheral vascular disease)   . Permanent atrial fibrillation   . DVT (deep venous thrombosis)   . Coronary artery disease   . Aortic stenosis   . GERD (gastroesophageal reflux disease)   . BPH (benign prostatic hypertrophy)   . Kidney stones   . Diverticulosis   . Shortness of breath   . Diabetes mellitus without complication     type 2  . 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 Symptomatic bradycardia 09-2012    s/p Medtronic pacemaker implanted by Dr [name]   . Gout   . Dermatophytosis of nail   . Gastroesophageal reflux disease    Past Surgical History  Procedure Laterality Date  . Coronary artery bypass graft    . Aortic valve replacement    . Paraesophageal hernia repair    . Hernia repair    . Pacemaker insertion  10-04-2012    Medtronic pacemaker implanted by Dr [name] for symptomatic bradycardia  . Esophagogastroduodenoscopy N/A 12/16/2013    Procedure: ESOPHAGOGASTRODUODENOSCOPY (EGD);  Surgeon: Fred Fair, MD;  Location: [facility];  Service: Endoscopy;  Laterality: N/A;  . Balloon dilation N/A 12/16/2013    Procedure: BALLOON DILATION;  Surgeon: Fred Fair, MD;  Location: [facility];  Service: Endoscopy;  Laterality: N/A;    Current Outpatient Prescriptions  Medication Sig Dispense Refill  . Coenzyme Q10 (CO Q 10 PO) Take 1 capsule by mouth daily.      . furosemide (LASIX) 20 MG tablet Take 20 mg by mouth every other day.      [name] glimepiride (AMARYL) 1 MG tablet Take 1 mg by mouth daily before breakfast.      .  nitroGLYCERIN (NITROSTAT) 0.4 MG SL tablet Place 1 tablet (0.4 mg total) under the tongue every 5 (five) minutes x 3 doses as needed for chest pain.  25 tablet  6  . ramipril (ALTACE) 2.5 MG capsule Take 1 capsule (2.5 mg total) by mouth daily.  30 capsule  3  . ranitidine (ZANTAC) 150 MG tablet Take 1 tablet (150 mg total) by mouth at bedtime.  30 tablet  0  . simvastatin (ZOCOR) 20 MG tablet Take 10 mg by mouth daily.      . vitamin B-12 (CYANOCOBALAMIN) 250 MCG tablet Take 1 tablet (250 mcg total) by mouth daily.  30 tablet  1  . warfarin (COUMADIN) 5 MG tablet Take as directed by coumadin clinic  25 tablet  3   No current facility-administered medications for this visit.   ROS- all systems are reviewed and negative except as per HPI above.  In addition, he has recently had esophageal dilatation,  + leg weakness/neuropathy, difficulty urinating  Physical Exam: Filed Vitals:   12/18/13 1023  BP: 120/72  Pulse: 78  Height: 5' 9.5" (1.765 m)  Weight: 201 lb 6.4 oz (91.354 kg)    GEN- The patient is well appearing, alert and oriented x 3 today.   Head- normocephalic, atraumatic Eyes-  Sclera clear, conjunctiva pink Ears- hearing intact Oropharynx- clear Lungs- Clear to ausculation bilaterally, normal  work of breathing Chest- pacemaker pocket is well healed Heart- Regular rate and rhythm (paced) GI- soft, NT, ND, + BS Extremities- no clubbing, cyanosis, + chronic dependant edema  Pacemaker interrogation- reviewed in detail today,  See PACEART report  Assessment and Plan:  1. Symptomatic bradycardia Normal pacemaker function See Pace Art report No changes today  2. HTN Stable No change required today  3. Permanent afib On coumadin  Carelink remotes every 3 months Return toe see me in 1 year in the device clinic

## 2013-12-18 NOTE — Patient Instructions (Signed)
Your physician wants you to follow-up in: 12 months with Dr Vallery Ridge will receive a reminder letter in the mail two months in advance. If you don't receive a letter, please call our office to schedule the follow-up appointment.   Remote monitoring is used to monitor your Pacemaker or ICD from home. This monitoring reduces the number of office visits required to check your device to one time per year. It allows [place] to keep an eye on the functioning of your device to ensure it is working properly. You are scheduled for a device check from home on 03/24/14. You may send your transmission at any time that day. If you have a wireless device, the transmission will be sent automatically. After your physician reviews your transmission, you will receive a postcard with your next transmission date.

## 2013-12-23 ENCOUNTER — Ambulatory Visit (INDEPENDENT_AMBULATORY_CARE_PROVIDER_SITE_OTHER): Payer: Medicare Other | Admitting: Pharmacist

## 2013-12-23 DIAGNOSIS — Z23 Encounter for immunization: Secondary | ICD-10-CM | POA: Diagnosis not present

## 2013-12-23 DIAGNOSIS — I4891 Unspecified atrial fibrillation: Secondary | ICD-10-CM | POA: Diagnosis not present

## 2013-12-23 DIAGNOSIS — Z5181 Encounter for therapeutic drug level monitoring: Secondary | ICD-10-CM

## 2013-12-23 LAB — POCT INR: INR: 1.7

## 2013-12-26 DIAGNOSIS — M6281 Muscle weakness (generalized): Secondary | ICD-10-CM | POA: Diagnosis not present

## 2013-12-26 DIAGNOSIS — M4800 Spinal stenosis, site unspecified: Secondary | ICD-10-CM | POA: Diagnosis not present

## 2013-12-26 DIAGNOSIS — M5136 Other intervertebral disc degeneration, lumbar region: Secondary | ICD-10-CM | POA: Diagnosis not present

## 2014-01-02 DIAGNOSIS — M5136 Other intervertebral disc degeneration, lumbar region: Secondary | ICD-10-CM | POA: Diagnosis not present

## 2014-01-02 DIAGNOSIS — M5441 Lumbago with sciatica, right side: Secondary | ICD-10-CM | POA: Diagnosis not present

## 2014-01-02 DIAGNOSIS — M5442 Lumbago with sciatica, left side: Secondary | ICD-10-CM | POA: Diagnosis not present

## 2014-01-10 ENCOUNTER — Ambulatory Visit (INDEPENDENT_AMBULATORY_CARE_PROVIDER_SITE_OTHER): Payer: Medicare Other | Admitting: Podiatry

## 2014-01-10 DIAGNOSIS — M79676 Pain in unspecified toe(s): Secondary | ICD-10-CM

## 2014-01-10 DIAGNOSIS — B351 Tinea unguium: Secondary | ICD-10-CM | POA: Diagnosis not present

## 2014-01-10 NOTE — Progress Notes (Signed)
Subjective:     Patient ID: [name], male   DOB: June 05, 1925, 80 y.o.   MRN: [id number]  HPI patient presents with nail disease 1-5 both feet that are painful and he cannot cut   Review of Systems     Objective:   Physical Exam Neurovascular status intact with thick yellow brittle nailbeds 1-5 both feet that are painfu    Assessment:      Mycotic nail infection is with pain 1-5 both feet    Plan:     Debride painful nailbeds 1-5 both feet with no iatrogenic bleeding noted

## 2014-01-10 NOTE — Progress Notes (Signed)
Subjective:     Patient ID: [name], male   DOB: 12-25-1925, 80 y.o.   MRN: [id number]  HPI patient states I need to get my nails cut on my feet   Review of Systems     Objective:   Physical Exam Neurovascular status intact with muscle strength adequate and range of motion within normal limits. 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

## 2014-01-20 ENCOUNTER — Telehealth: Payer: Self-pay | Admitting: Interventional Cardiology

## 2014-01-20 NOTE — Telephone Encounter (Signed)
Fax [phone], requesting holding coumidan 5 days prior to epidural  injection

## 2014-01-20 NOTE — Telephone Encounter (Signed)
Please advise 

## 2014-01-21 NOTE — Telephone Encounter (Signed)
OK to hold COumadin 5 days prior to injection.

## 2014-01-21 NOTE — Telephone Encounter (Signed)
**Note De-Identified [name] Obfuscation** Note faxed to [phone]. I did receive conformation that this fax was sent successfully.

## 2014-01-31 ENCOUNTER — Encounter: Payer: Self-pay | Admitting: *Deleted

## 2014-02-04 ENCOUNTER — Ambulatory Visit: Payer: Medicare Other

## 2014-02-04 ENCOUNTER — Ambulatory Visit (INDEPENDENT_AMBULATORY_CARE_PROVIDER_SITE_OTHER): Payer: Medicare Other

## 2014-02-04 DIAGNOSIS — Z5181 Encounter for therapeutic drug level monitoring: Secondary | ICD-10-CM | POA: Diagnosis not present

## 2014-02-04 DIAGNOSIS — I4891 Unspecified atrial fibrillation: Secondary | ICD-10-CM

## 2014-02-04 DIAGNOSIS — M5136 Other intervertebral disc degeneration, lumbar region: Secondary | ICD-10-CM | POA: Diagnosis not present

## 2014-02-04 DIAGNOSIS — M4800 Spinal stenosis, site unspecified: Secondary | ICD-10-CM | POA: Diagnosis not present

## 2014-02-04 LAB — POCT INR: INR: 1.3

## 2014-02-06 ENCOUNTER — Encounter (HOSPITAL_COMMUNITY): Payer: Self-pay | Admitting: Internal Medicine

## 2014-02-06 DIAGNOSIS — M25512 Pain in left shoulder: Secondary | ICD-10-CM | POA: Diagnosis not present

## 2014-02-06 DIAGNOSIS — M25511 Pain in right shoulder: Secondary | ICD-10-CM | POA: Diagnosis not present

## 2014-02-12 ENCOUNTER — Ambulatory Visit (INDEPENDENT_AMBULATORY_CARE_PROVIDER_SITE_OTHER): Payer: Medicare Other | Admitting: *Deleted

## 2014-02-12 DIAGNOSIS — Z5181 Encounter for therapeutic drug level monitoring: Secondary | ICD-10-CM | POA: Diagnosis not present

## 2014-02-12 DIAGNOSIS — I4891 Unspecified atrial fibrillation: Secondary | ICD-10-CM

## 2014-02-12 LAB — POCT INR: INR: 1.8

## 2014-02-25 ENCOUNTER — Other Ambulatory Visit: Payer: Self-pay | Admitting: *Deleted

## 2014-02-25 MED ORDER — RAMIPRIL 2.5 MG PO CAPS: 2.5000 mg | ORAL_CAPSULE | Freq: Every day | ORAL | Status: DC

## 2014-02-26 ENCOUNTER — Ambulatory Visit (INDEPENDENT_AMBULATORY_CARE_PROVIDER_SITE_OTHER): Payer: Medicare Other | Admitting: Surgery

## 2014-02-26 DIAGNOSIS — Z5181 Encounter for therapeutic drug level monitoring: Secondary | ICD-10-CM | POA: Diagnosis not present

## 2014-02-26 DIAGNOSIS — I4891 Unspecified atrial fibrillation: Secondary | ICD-10-CM | POA: Diagnosis not present

## 2014-02-26 LAB — POCT INR: INR: 3.4

## 2014-03-11 DIAGNOSIS — I10 Essential (primary) hypertension: Secondary | ICD-10-CM | POA: Diagnosis not present

## 2014-03-11 DIAGNOSIS — E1122 Type 2 diabetes mellitus with diabetic chronic kidney disease: Secondary | ICD-10-CM | POA: Diagnosis not present

## 2014-03-11 DIAGNOSIS — N183 Chronic kidney disease, stage 3 (moderate): Secondary | ICD-10-CM | POA: Diagnosis not present

## 2014-03-11 DIAGNOSIS — M519 Unspecified thoracic, thoracolumbar and lumbosacral intervertebral disc disorder: Secondary | ICD-10-CM | POA: Diagnosis not present

## 2014-03-11 DIAGNOSIS — I482 Chronic atrial fibrillation: Secondary | ICD-10-CM | POA: Diagnosis not present

## 2014-03-11 DIAGNOSIS — E780 Pure hypercholesterolemia: Secondary | ICD-10-CM | POA: Diagnosis not present

## 2014-03-11 DIAGNOSIS — M4806 Spinal stenosis, lumbar region: Secondary | ICD-10-CM | POA: Diagnosis not present

## 2014-03-11 DIAGNOSIS — Z23 Encounter for immunization: Secondary | ICD-10-CM | POA: Diagnosis not present

## 2014-03-12 ENCOUNTER — Ambulatory Visit (INDEPENDENT_AMBULATORY_CARE_PROVIDER_SITE_OTHER): Payer: Medicare Other | Admitting: Pharmacist

## 2014-03-12 DIAGNOSIS — Z5181 Encounter for therapeutic drug level monitoring: Secondary | ICD-10-CM | POA: Diagnosis not present

## 2014-03-12 DIAGNOSIS — I4891 Unspecified atrial fibrillation: Secondary | ICD-10-CM

## 2014-03-12 LAB — POCT INR: INR: 1.9

## 2014-03-24 ENCOUNTER — Ambulatory Visit (INDEPENDENT_AMBULATORY_CARE_PROVIDER_SITE_OTHER): Payer: Medicare Other | Admitting: *Deleted

## 2014-03-24 ENCOUNTER — Telehealth: Payer: Self-pay | Admitting: Cardiology

## 2014-03-24 DIAGNOSIS — R001 Bradycardia, unspecified: Secondary | ICD-10-CM

## 2014-03-24 NOTE — Telephone Encounter (Signed)
Spoke with pt and reminded pt of remote transmission that is due today. Pt verbalized understanding.   

## 2014-03-24 NOTE — Progress Notes (Signed)
Remote pacemaker transmission.   

## 2014-03-26 ENCOUNTER — Ambulatory Visit (INDEPENDENT_AMBULATORY_CARE_PROVIDER_SITE_OTHER): Payer: Medicare Other | Admitting: Surgery

## 2014-03-26 DIAGNOSIS — Z5181 Encounter for therapeutic drug level monitoring: Secondary | ICD-10-CM

## 2014-03-26 DIAGNOSIS — E119 Type 2 diabetes mellitus without complications: Secondary | ICD-10-CM | POA: Diagnosis not present

## 2014-03-26 DIAGNOSIS — H25099 Other age-related incipient cataract, unspecified eye: Secondary | ICD-10-CM | POA: Diagnosis not present

## 2014-03-26 DIAGNOSIS — I4891 Unspecified atrial fibrillation: Secondary | ICD-10-CM

## 2014-03-26 LAB — POCT INR: INR: 3.2

## 2014-03-30 LAB — MDC_IDC_ENUM_SESS_TYPE_REMOTE
Battery Impedance: 330 Ohm
Brady Statistic RV Percent Paced: 95 %
Lead Channel Impedance Value: 564 Ohm
Lead Channel Pacing Threshold Amplitude: 0.5 V
Lead Channel Pacing Threshold Pulse Width: 0.4 ms
Lead Channel Setting Pacing Amplitude: 2.5 V
Lead Channel Setting Pacing Pulse Width: 0.4 ms
Lead Channel Setting Sensing Sensitivity: 2 mV
MDC IDC MSMT BATTERY REMAINING LONGEVITY: 93 mo
MDC IDC MSMT BATTERY VOLTAGE: 2.78 V
MDC IDC MSMT LEADCHNL RA IMPEDANCE VALUE: 0 Ohm
MDC IDC SESS DTM: 20160125192755

## 2014-04-01 ENCOUNTER — Other Ambulatory Visit: Payer: Self-pay | Admitting: *Deleted

## 2014-04-01 MED ORDER — WARFARIN SODIUM 5 MG PO TABS: ORAL_TABLET | ORAL | Status: DC

## 2014-04-01 NOTE — Telephone Encounter (Signed)
Refill done as requested 

## 2014-04-02 ENCOUNTER — Encounter: Payer: Self-pay | Admitting: Cardiology

## 2014-04-08 ENCOUNTER — Ambulatory Visit (INDEPENDENT_AMBULATORY_CARE_PROVIDER_SITE_OTHER): Payer: Medicare Other | Admitting: Podiatry

## 2014-04-08 DIAGNOSIS — B351 Tinea unguium: Secondary | ICD-10-CM | POA: Diagnosis not present

## 2014-04-08 DIAGNOSIS — M79676 Pain in unspecified toe(s): Secondary | ICD-10-CM | POA: Diagnosis not present

## 2014-04-08 NOTE — Progress Notes (Signed)
Subjective:     Patient ID: [name], male   DOB: [birth year], 81 y.o.   MRN: [id number]  HPI patient presents with nail disease 1-5 both feet that are painful and he cannot cut   Review of Systems     Objective:   Physical Exam Neurovascular status intact with thick yellow brittle nailbeds 1-5 both feet that are painfu    Assessment:      Mycotic nail infection is with pain 1-5 both feet    Plan:     Debride painful nailbeds 1-5 both feet with no iatrogenic bleeding noted

## 2014-04-09 ENCOUNTER — Ambulatory Visit (INDEPENDENT_AMBULATORY_CARE_PROVIDER_SITE_OTHER): Payer: Medicare Other | Admitting: Surgery

## 2014-04-09 DIAGNOSIS — Z5181 Encounter for therapeutic drug level monitoring: Secondary | ICD-10-CM

## 2014-04-09 DIAGNOSIS — I4891 Unspecified atrial fibrillation: Secondary | ICD-10-CM

## 2014-04-09 LAB — POCT INR: INR: 2.4

## 2014-04-11 ENCOUNTER — Other Ambulatory Visit: Payer: PRIVATE HEALTH INSURANCE

## 2014-04-30 ENCOUNTER — Ambulatory Visit (INDEPENDENT_AMBULATORY_CARE_PROVIDER_SITE_OTHER): Payer: Medicare Other | Admitting: *Deleted

## 2014-04-30 DIAGNOSIS — I4891 Unspecified atrial fibrillation: Secondary | ICD-10-CM

## 2014-04-30 DIAGNOSIS — Z5181 Encounter for therapeutic drug level monitoring: Secondary | ICD-10-CM | POA: Diagnosis not present

## 2014-04-30 LAB — POCT INR: INR: 2.1

## 2014-05-21 ENCOUNTER — Encounter: Payer: Self-pay | Admitting: Internal Medicine

## 2014-05-28 ENCOUNTER — Ambulatory Visit (INDEPENDENT_AMBULATORY_CARE_PROVIDER_SITE_OTHER): Payer: Medicare Other | Admitting: *Deleted

## 2014-05-28 DIAGNOSIS — Z5181 Encounter for therapeutic drug level monitoring: Secondary | ICD-10-CM

## 2014-05-28 DIAGNOSIS — I4891 Unspecified atrial fibrillation: Secondary | ICD-10-CM

## 2014-05-28 LAB — POCT INR: INR: 2.2

## 2014-06-24 ENCOUNTER — Ambulatory Visit (INDEPENDENT_AMBULATORY_CARE_PROVIDER_SITE_OTHER): Payer: Medicare Other | Admitting: *Deleted

## 2014-06-24 ENCOUNTER — Telehealth: Payer: Self-pay | Admitting: Cardiology

## 2014-06-24 DIAGNOSIS — R001 Bradycardia, unspecified: Secondary | ICD-10-CM

## 2014-06-24 NOTE — Telephone Encounter (Signed)
LMOVM reminding pt to send remote transmission.   

## 2014-06-25 ENCOUNTER — Ambulatory Visit (INDEPENDENT_AMBULATORY_CARE_PROVIDER_SITE_OTHER): Payer: Medicare Other | Admitting: Surgery

## 2014-06-25 ENCOUNTER — Encounter: Payer: Self-pay | Admitting: Cardiology

## 2014-06-25 DIAGNOSIS — Z5181 Encounter for therapeutic drug level monitoring: Secondary | ICD-10-CM

## 2014-06-25 DIAGNOSIS — I4891 Unspecified atrial fibrillation: Secondary | ICD-10-CM | POA: Diagnosis not present

## 2014-06-25 LAB — POCT INR: INR: 1.6

## 2014-06-26 DIAGNOSIS — R001 Bradycardia, unspecified: Secondary | ICD-10-CM

## 2014-06-30 NOTE — Progress Notes (Signed)
Remote pacemaker transmission.   

## 2014-07-04 LAB — CUP PACEART REMOTE DEVICE CHECK
Battery Remaining Longevity: 89 mo
Battery Voltage: 2.78 V
Brady Statistic RV Percent Paced: 95 %
Date Time Interrogation Session: 20160428164849
Lead Channel Impedance Value: 551 Ohm
Lead Channel Pacing Threshold Pulse Width: 0.4 ms
Lead Channel Setting Sensing Sensitivity: 2 mV
MDC IDC MSMT BATTERY IMPEDANCE: 378 Ohm
MDC IDC MSMT LEADCHNL RA IMPEDANCE VALUE: 0 Ohm
MDC IDC MSMT LEADCHNL RV PACING THRESHOLD AMPLITUDE: 0.625 V
MDC IDC SET LEADCHNL RV PACING AMPLITUDE: 2.5 V
MDC IDC SET LEADCHNL RV PACING PULSEWIDTH: 0.4 ms

## 2014-07-09 ENCOUNTER — Ambulatory Visit (INDEPENDENT_AMBULATORY_CARE_PROVIDER_SITE_OTHER): Payer: Medicare Other | Admitting: *Deleted

## 2014-07-09 DIAGNOSIS — I4891 Unspecified atrial fibrillation: Secondary | ICD-10-CM | POA: Diagnosis not present

## 2014-07-09 DIAGNOSIS — Z5181 Encounter for therapeutic drug level monitoring: Secondary | ICD-10-CM

## 2014-07-09 LAB — POCT INR: INR: 2.4

## 2014-07-10 ENCOUNTER — Encounter: Payer: Self-pay | Admitting: Cardiology

## 2014-07-11 ENCOUNTER — Ambulatory Visit: Payer: Medicare Other

## 2014-07-14 DIAGNOSIS — M4806 Spinal stenosis, lumbar region: Secondary | ICD-10-CM | POA: Diagnosis not present

## 2014-07-14 DIAGNOSIS — I10 Essential (primary) hypertension: Secondary | ICD-10-CM | POA: Diagnosis not present

## 2014-07-14 DIAGNOSIS — E0842 Diabetes mellitus due to underlying condition with diabetic polyneuropathy: Secondary | ICD-10-CM | POA: Diagnosis not present

## 2014-07-14 DIAGNOSIS — Z952 Presence of prosthetic heart valve: Secondary | ICD-10-CM | POA: Diagnosis not present

## 2014-07-14 DIAGNOSIS — E780 Pure hypercholesterolemia: Secondary | ICD-10-CM | POA: Diagnosis not present

## 2014-07-14 DIAGNOSIS — I482 Chronic atrial fibrillation: Secondary | ICD-10-CM | POA: Diagnosis not present

## 2014-07-14 DIAGNOSIS — E1122 Type 2 diabetes mellitus with diabetic chronic kidney disease: Secondary | ICD-10-CM | POA: Diagnosis not present

## 2014-07-14 DIAGNOSIS — N183 Chronic kidney disease, stage 3 (moderate): Secondary | ICD-10-CM | POA: Diagnosis not present

## 2014-07-15 ENCOUNTER — Ambulatory Visit (INDEPENDENT_AMBULATORY_CARE_PROVIDER_SITE_OTHER): Payer: Medicare Other | Admitting: Podiatry

## 2014-07-15 DIAGNOSIS — B351 Tinea unguium: Secondary | ICD-10-CM | POA: Diagnosis not present

## 2014-07-15 DIAGNOSIS — M79676 Pain in unspecified toe(s): Secondary | ICD-10-CM | POA: Diagnosis not present

## 2014-07-15 NOTE — Progress Notes (Signed)
Subjective: 81 y.o.-year-old male returns the office today for painful, elongated, thickened toenails which he is unable to trim himself. Denies any redness or drainage around the nails. Denies any acute changes since last appointment and no new complaints today. Denies any systemic complaints such as fevers, chills, nausea, vomiting.   Objective: AAO 3, NAD DP/PT pulses palpable, CRT less than 3 seconds Protective sensation intact with Simms Weinstein monofilament Nails hypertrophic, dystrophic, elongated, brittle, discolored 10. There is tenderness overlying the nails 1-5 bilaterally. There is no surrounding erythema or drainage along the nail sites. No open lesions or pre-ulcerative lesions are identified. No other areas of tenderness bilateral lower extremities. No overlying edema, erythema, increased warmth. No pain with calf compression, swelling, warmth, erythema.  Assessment: Patient presents with symptomatic onychomycosis  Plan: -Treatment options including alternatives, risks, complications were discussed -Nails sharply debrided 10 without complication/bleeding. -Discussed daily foot inspection. If there are any changes, to call the office immediately.  -Follow-up in 3 months or sooner if any problems are to arise. In the meantime, encouraged to call the office with any questions, concerns, changes symptoms.

## 2014-07-16 ENCOUNTER — Encounter: Payer: Self-pay | Admitting: Internal Medicine

## 2014-07-30 ENCOUNTER — Ambulatory Visit (INDEPENDENT_AMBULATORY_CARE_PROVIDER_SITE_OTHER): Payer: Medicare Other | Admitting: *Deleted

## 2014-07-30 DIAGNOSIS — Z5181 Encounter for therapeutic drug level monitoring: Secondary | ICD-10-CM | POA: Diagnosis not present

## 2014-07-30 DIAGNOSIS — I4891 Unspecified atrial fibrillation: Secondary | ICD-10-CM

## 2014-07-30 LAB — POCT INR: INR: 2

## 2014-08-27 ENCOUNTER — Ambulatory Visit (INDEPENDENT_AMBULATORY_CARE_PROVIDER_SITE_OTHER): Payer: Medicare Other | Admitting: *Deleted

## 2014-08-27 DIAGNOSIS — Z5181 Encounter for therapeutic drug level monitoring: Secondary | ICD-10-CM | POA: Diagnosis not present

## 2014-08-27 DIAGNOSIS — I4891 Unspecified atrial fibrillation: Secondary | ICD-10-CM | POA: Diagnosis not present

## 2014-08-27 LAB — POCT INR: INR: 1.9

## 2014-09-22 ENCOUNTER — Ambulatory Visit (INDEPENDENT_AMBULATORY_CARE_PROVIDER_SITE_OTHER): Payer: Medicare Other | Admitting: Pharmacist

## 2014-09-22 DIAGNOSIS — Z5181 Encounter for therapeutic drug level monitoring: Secondary | ICD-10-CM | POA: Diagnosis not present

## 2014-09-22 DIAGNOSIS — I4891 Unspecified atrial fibrillation: Secondary | ICD-10-CM | POA: Diagnosis not present

## 2014-09-22 LAB — POCT INR: INR: 2.5

## 2014-09-24 ENCOUNTER — Telehealth: Payer: Self-pay

## 2014-09-24 ENCOUNTER — Other Ambulatory Visit: Payer: Self-pay | Admitting: Interventional Cardiology

## 2014-09-24 MED ORDER — WARFARIN SODIUM 5 MG PO TABS: ORAL_TABLET | ORAL | Status: DC

## 2014-09-24 NOTE — Telephone Encounter (Signed)
Refill sent to the Pharmacy

## 2014-09-24 NOTE — Telephone Encounter (Signed)
Please refill patient's Coumadin at [place], [place].   If there is a problem please call [phone]

## 2014-09-29 ENCOUNTER — Ambulatory Visit (INDEPENDENT_AMBULATORY_CARE_PROVIDER_SITE_OTHER): Payer: Medicare Other | Admitting: *Deleted

## 2014-09-29 DIAGNOSIS — R001 Bradycardia, unspecified: Secondary | ICD-10-CM | POA: Diagnosis not present

## 2014-09-30 NOTE — Progress Notes (Signed)
Remote pacemaker transmission.   

## 2014-10-02 LAB — CUP PACEART REMOTE DEVICE CHECK
Battery Impedance: 428 Ohm
Battery Remaining Longevity: 86 mo
Brady Statistic RV Percent Paced: 96 %
Date Time Interrogation Session: 20160801131216
Lead Channel Impedance Value: 0 Ohm
Lead Channel Impedance Value: 558 Ohm
Lead Channel Pacing Threshold Amplitude: 0.625 V
Lead Channel Pacing Threshold Pulse Width: 0.4 ms
Lead Channel Setting Pacing Pulse Width: 0.4 ms
Lead Channel Setting Sensing Sensitivity: 2 mV
MDC IDC MSMT BATTERY VOLTAGE: 2.78 V
MDC IDC SET LEADCHNL RV PACING AMPLITUDE: 2.5 V

## 2014-10-13 ENCOUNTER — Ambulatory Visit (INDEPENDENT_AMBULATORY_CARE_PROVIDER_SITE_OTHER): Payer: Medicare Other

## 2014-10-13 ENCOUNTER — Encounter: Payer: Self-pay | Admitting: Cardiology

## 2014-10-13 DIAGNOSIS — Z5181 Encounter for therapeutic drug level monitoring: Secondary | ICD-10-CM

## 2014-10-13 DIAGNOSIS — I4891 Unspecified atrial fibrillation: Secondary | ICD-10-CM | POA: Diagnosis not present

## 2014-10-13 LAB — POCT INR: INR: 2.1

## 2014-10-20 ENCOUNTER — Encounter: Payer: Self-pay | Admitting: Internal Medicine

## 2014-10-21 ENCOUNTER — Ambulatory Visit (INDEPENDENT_AMBULATORY_CARE_PROVIDER_SITE_OTHER): Payer: Medicare Other | Admitting: Podiatry

## 2014-10-21 DIAGNOSIS — L601 Onycholysis: Secondary | ICD-10-CM

## 2014-10-21 DIAGNOSIS — B351 Tinea unguium: Secondary | ICD-10-CM

## 2014-10-21 DIAGNOSIS — M79676 Pain in unspecified toe(s): Secondary | ICD-10-CM

## 2014-10-21 NOTE — Patient Instructions (Signed)
Monitor for any clinical signs or symptoms of infection and directed to call the office immediately should any occur or go to the ER.  

## 2014-10-21 NOTE — Progress Notes (Signed)
Subjective: 81 y.o.-year-old male returns the office today for painful, elongated, thickened toenails which he is unable to trim himself. Denies any redness or drainage around the nails.  He states that he did injure his left hallux toenail on the nail lifted. He states the nails very loose. He has been applying antibiotic ointment and a Band-Aid overlying the toenail. Denies any swelling or redness from the toenail denies any drainage or purulence.Denies any acute changes since last appointment and no new complaints today. Denies any systemic complaints such as fevers, chills, nausea, vomiting.   Objective: AAO 3, NAD DP/PT pulses palpable, CRT less than 3 seconds Nails hypertrophic, dystrophic, elongated, brittle, discolored 10. There is tenderness overlying the nails 1-5 bilaterally. There is no surrounding erythema or drainage along the nail sites. Left hallux nail is excessively loose from the nail bed is only adhered by Stanton Kidney small portion on the proximal nail border. There is  Minimal surrounding erythema without any ascending cellulitis. There is no drainage or purulence. No open lesions or pre-ulcerative lesions are identified. No other areas of tenderness bilateral lower extremities. No overlying edema, erythema, increased warmth. No pain with calf compression, swelling, warmth, erythema.  Assessment: Patient presents with symptomatic onychomycosis; Left hallux onycholysis   Plan: -Treatment options including alternatives, risks, complications were discussed -Nails sharply debrided 10 without complication/bleeding. -Hallux nail of the left foot was removed without complications with very minimal effort. There is no bleeding. Would recommend continue soaking in Epson salts and covering with antibiotic ointment and a Band-Aid. Monitor for any clinical signs or symptoms of infection and directed to call the office immediately should any occur or go to the ER. -Discussed daily foot  inspection. If there are any changes, to call the office immediately.  -Follow-up in 3 months or sooner if any problems are to arise. In the meantime, encouraged to call the office with any questions, concerns, changes symptoms.  Celesta Gentile, DPM

## 2014-11-10 ENCOUNTER — Ambulatory Visit (INDEPENDENT_AMBULATORY_CARE_PROVIDER_SITE_OTHER): Payer: Medicare Other | Admitting: Pharmacist

## 2014-11-10 DIAGNOSIS — I4891 Unspecified atrial fibrillation: Secondary | ICD-10-CM | POA: Diagnosis not present

## 2014-11-10 DIAGNOSIS — Z5181 Encounter for therapeutic drug level monitoring: Secondary | ICD-10-CM

## 2014-11-10 LAB — POCT INR: INR: 2.1

## 2014-11-14 DIAGNOSIS — Z23 Encounter for immunization: Secondary | ICD-10-CM | POA: Diagnosis not present

## 2014-11-14 DIAGNOSIS — E1122 Type 2 diabetes mellitus with diabetic chronic kidney disease: Secondary | ICD-10-CM | POA: Diagnosis not present

## 2014-11-14 DIAGNOSIS — I482 Chronic atrial fibrillation: Secondary | ICD-10-CM | POA: Diagnosis not present

## 2014-11-14 DIAGNOSIS — K259 Gastric ulcer, unspecified as acute or chronic, without hemorrhage or perforation: Secondary | ICD-10-CM | POA: Diagnosis not present

## 2014-11-14 DIAGNOSIS — Z1389 Encounter for screening for other disorder: Secondary | ICD-10-CM | POA: Diagnosis not present

## 2014-11-14 DIAGNOSIS — I10 Essential (primary) hypertension: Secondary | ICD-10-CM | POA: Diagnosis not present

## 2014-11-14 DIAGNOSIS — E780 Pure hypercholesterolemia: Secondary | ICD-10-CM | POA: Diagnosis not present

## 2014-11-14 DIAGNOSIS — N183 Chronic kidney disease, stage 3 (moderate): Secondary | ICD-10-CM | POA: Diagnosis not present

## 2014-11-14 DIAGNOSIS — Z952 Presence of prosthetic heart valve: Secondary | ICD-10-CM | POA: Diagnosis not present

## 2014-11-14 DIAGNOSIS — Z0000 Encounter for general adult medical examination without abnormal findings: Secondary | ICD-10-CM | POA: Diagnosis not present

## 2014-11-14 DIAGNOSIS — M21372 Foot drop, left foot: Secondary | ICD-10-CM | POA: Diagnosis not present

## 2014-12-22 ENCOUNTER — Encounter: Payer: Self-pay | Admitting: Internal Medicine

## 2014-12-22 ENCOUNTER — Ambulatory Visit (INDEPENDENT_AMBULATORY_CARE_PROVIDER_SITE_OTHER): Payer: Medicare Other

## 2014-12-22 ENCOUNTER — Ambulatory Visit (INDEPENDENT_AMBULATORY_CARE_PROVIDER_SITE_OTHER): Payer: Medicare Other | Admitting: Internal Medicine

## 2014-12-22 ENCOUNTER — Emergency Department (HOSPITAL_COMMUNITY): Payer: Medicare Other

## 2014-12-22 ENCOUNTER — Inpatient Hospital Stay (HOSPITAL_COMMUNITY)
Admission: EM | Admit: 2014-12-22 | Discharge: 2014-12-30 | DRG: 872 | Disposition: A | Discharge status: Skilled Nursing Facility | Payer: Medicare Other | Attending: Internal Medicine | Admitting: Internal Medicine

## 2014-12-22 ENCOUNTER — Encounter (HOSPITAL_COMMUNITY): Payer: Self-pay | Admitting: Emergency Medicine

## 2014-12-22 VITALS — BP 128/76 | HR 86 | Ht 69.5 in | Wt 209.4 lb

## 2014-12-22 DIAGNOSIS — Z953 Presence of xenogenic heart valve: Secondary | ICD-10-CM

## 2014-12-22 DIAGNOSIS — A419 Sepsis, unspecified organism: Secondary | ICD-10-CM | POA: Diagnosis present

## 2014-12-22 DIAGNOSIS — Z950 Presence of cardiac pacemaker: Secondary | ICD-10-CM | POA: Diagnosis present

## 2014-12-22 DIAGNOSIS — R109 Unspecified abdominal pain: Secondary | ICD-10-CM | POA: Diagnosis not present

## 2014-12-22 DIAGNOSIS — I130 Hypertensive heart and chronic kidney disease with heart failure and stage 1 through stage 4 chronic kidney disease, or unspecified chronic kidney disease: Secondary | ICD-10-CM | POA: Diagnosis present

## 2014-12-22 DIAGNOSIS — M25552 Pain in left hip: Secondary | ICD-10-CM | POA: Diagnosis present

## 2014-12-22 DIAGNOSIS — Z79899 Other long term (current) drug therapy: Secondary | ICD-10-CM

## 2014-12-22 DIAGNOSIS — E1122 Type 2 diabetes mellitus with diabetic chronic kidney disease: Secondary | ICD-10-CM | POA: Diagnosis present

## 2014-12-22 DIAGNOSIS — R1032 Left lower quadrant pain: Secondary | ICD-10-CM

## 2014-12-22 DIAGNOSIS — R519 Headache, unspecified: Secondary | ICD-10-CM

## 2014-12-22 DIAGNOSIS — E872 Acidosis: Secondary | ICD-10-CM | POA: Diagnosis not present

## 2014-12-22 DIAGNOSIS — I959 Hypotension, unspecified: Secondary | ICD-10-CM | POA: Diagnosis not present

## 2014-12-22 DIAGNOSIS — J811 Chronic pulmonary edema: Secondary | ICD-10-CM | POA: Insufficient documentation

## 2014-12-22 DIAGNOSIS — R627 Adult failure to thrive: Secondary | ICD-10-CM | POA: Insufficient documentation

## 2014-12-22 DIAGNOSIS — R791 Abnormal coagulation profile: Secondary | ICD-10-CM | POA: Diagnosis present

## 2014-12-22 DIAGNOSIS — I2510 Atherosclerotic heart disease of native coronary artery without angina pectoris: Secondary | ICD-10-CM | POA: Diagnosis present

## 2014-12-22 DIAGNOSIS — J9811 Atelectasis: Secondary | ICD-10-CM | POA: Diagnosis present

## 2014-12-22 DIAGNOSIS — R1030 Lower abdominal pain, unspecified: Secondary | ICD-10-CM | POA: Insufficient documentation

## 2014-12-22 DIAGNOSIS — R7989 Other specified abnormal findings of blood chemistry: Secondary | ICD-10-CM | POA: Insufficient documentation

## 2014-12-22 DIAGNOSIS — Z952 Presence of prosthetic heart valve: Secondary | ICD-10-CM

## 2014-12-22 DIAGNOSIS — N401 Enlarged prostate with lower urinary tract symptoms: Secondary | ICD-10-CM | POA: Diagnosis present

## 2014-12-22 DIAGNOSIS — Z5181 Encounter for therapeutic drug level monitoring: Secondary | ICD-10-CM

## 2014-12-22 DIAGNOSIS — R3911 Hesitancy of micturition: Secondary | ICD-10-CM | POA: Diagnosis present

## 2014-12-22 DIAGNOSIS — I10 Essential (primary) hypertension: Secondary | ICD-10-CM | POA: Diagnosis not present

## 2014-12-22 DIAGNOSIS — I482 Chronic atrial fibrillation: Secondary | ICD-10-CM | POA: Diagnosis present

## 2014-12-22 DIAGNOSIS — N289 Disorder of kidney and ureter, unspecified: Secondary | ICD-10-CM

## 2014-12-22 DIAGNOSIS — I739 Peripheral vascular disease, unspecified: Secondary | ICD-10-CM | POA: Diagnosis present

## 2014-12-22 DIAGNOSIS — M25559 Pain in unspecified hip: Secondary | ICD-10-CM | POA: Insufficient documentation

## 2014-12-22 DIAGNOSIS — N179 Acute kidney failure, unspecified: Secondary | ICD-10-CM | POA: Diagnosis not present

## 2014-12-22 DIAGNOSIS — I4891 Unspecified atrial fibrillation: Secondary | ICD-10-CM

## 2014-12-22 DIAGNOSIS — Z87442 Personal history of urinary calculi: Secondary | ICD-10-CM

## 2014-12-22 DIAGNOSIS — K219 Gastro-esophageal reflux disease without esophagitis: Secondary | ICD-10-CM | POA: Diagnosis present

## 2014-12-22 DIAGNOSIS — A4101 Sepsis due to Methicillin susceptible Staphylococcus aureus: Secondary | ICD-10-CM | POA: Diagnosis not present

## 2014-12-22 DIAGNOSIS — E1169 Type 2 diabetes mellitus with other specified complication: Secondary | ICD-10-CM | POA: Diagnosis present

## 2014-12-22 DIAGNOSIS — Z7901 Long term (current) use of anticoagulants: Secondary | ICD-10-CM

## 2014-12-22 DIAGNOSIS — R740 Nonspecific elevation of levels of transaminase and lactic acid dehydrogenase [LDH]: Secondary | ICD-10-CM | POA: Diagnosis not present

## 2014-12-22 DIAGNOSIS — R0602 Shortness of breath: Secondary | ICD-10-CM | POA: Diagnosis not present

## 2014-12-22 DIAGNOSIS — N183 Chronic kidney disease, stage 3 unspecified: Secondary | ICD-10-CM | POA: Diagnosis present

## 2014-12-22 DIAGNOSIS — R51 Headache: Secondary | ICD-10-CM

## 2014-12-22 DIAGNOSIS — I5032 Chronic diastolic (congestive) heart failure: Secondary | ICD-10-CM | POA: Diagnosis present

## 2014-12-22 DIAGNOSIS — Z86718 Personal history of other venous thrombosis and embolism: Secondary | ICD-10-CM

## 2014-12-22 DIAGNOSIS — Z951 Presence of aortocoronary bypass graft: Secondary | ICD-10-CM

## 2014-12-22 DIAGNOSIS — R509 Fever, unspecified: Secondary | ICD-10-CM | POA: Diagnosis not present

## 2014-12-22 DIAGNOSIS — K5900 Constipation, unspecified: Secondary | ICD-10-CM | POA: Diagnosis present

## 2014-12-22 DIAGNOSIS — E1159 Type 2 diabetes mellitus with other circulatory complications: Secondary | ICD-10-CM | POA: Diagnosis present

## 2014-12-22 DIAGNOSIS — M25551 Pain in right hip: Secondary | ICD-10-CM | POA: Insufficient documentation

## 2014-12-22 DIAGNOSIS — M21372 Foot drop, left foot: Secondary | ICD-10-CM | POA: Diagnosis present

## 2014-12-22 DIAGNOSIS — M47816 Spondylosis without myelopathy or radiculopathy, lumbar region: Secondary | ICD-10-CM | POA: Diagnosis present

## 2014-12-22 HISTORY — DX: Secondary pulmonary arterial hypertension: I27.21

## 2014-12-22 LAB — CUP PACEART INCLINIC DEVICE CHECK
Battery Impedance: 528 Ohm
Battery Remaining Longevity: 79 mo
Battery Voltage: 2.77 V
Brady Statistic RV Percent Paced: 96 %
Implantable Lead Location: 753860
Lead Channel Impedance Value: 0 Ohm
Lead Channel Impedance Value: 566 Ohm
Lead Channel Pacing Threshold Amplitude: 0.5 V
Lead Channel Pacing Threshold Pulse Width: 0.4 ms
Lead Channel Setting Pacing Amplitude: 2.5 V
MDC IDC LEAD IMPLANT DT: 20140807
MDC IDC MSMT LEADCHNL RV SENSING INTR AMPL: 5.6 mV
MDC IDC SESS DTM: 20161024111124
MDC IDC SET LEADCHNL RV PACING PULSEWIDTH: 0.4 ms
MDC IDC SET LEADCHNL RV SENSING SENSITIVITY: 2 mV

## 2014-12-22 LAB — POCT INR: INR: 1.9

## 2014-12-22 MED ORDER — SODIUM CHLORIDE 0.9 % IV SOLN
1000.0000 mL | Freq: Once | INTRAVENOUS | Status: AC
  Administered 2014-12-22: 1000 mL via INTRAVENOUS

## 2014-12-22 MED ORDER — SODIUM CHLORIDE 0.9 % IV SOLN
1000.0000 mL | INTRAVENOUS | Status: DC
  Administered 2014-12-22: 1000 mL via INTRAVENOUS

## 2014-12-22 NOTE — Progress Notes (Signed)
Primary Cardiologist:  Dr Irish Lack PCP: Wenda Low, MD  [name] is a 81 y.o. male who presents today for routine electrophysiology followup.  Since his last visit, the patient reports doing very well. He remains active for her age.  Today, he denies symptoms of palpitations, chest pain, presyncope, or syncope.  Shortness of breath, lower extremity edema, and dizziness are stable.  The patient is otherwise without complaint today.   Past Medical History  Diagnosis Date  . PVD (peripheral vascular disease) ([place])   . Permanent atrial fibrillation ([place])   . DVT (deep venous thrombosis) ([place])   . Coronary artery disease   . Aortic stenosis   . GERD (gastroesophageal reflux disease)   . BPH (benign prostatic hypertrophy)   . Kidney stones   . Diverticulosis   . Shortness of breath   . Diabetes mellitus without complication ([place])     type 2  . 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 Gout   . Dermatophytosis of nail   . Gastroesophageal reflux disease    Past Surgical History  Procedure Laterality Date  . Coronary artery bypass graft    . Aortic valve replacement    . Paraesophageal hernia repair    . Hernia repair    . Pacemaker insertion  10-04-2012    Medtronic pacemaker implanted by Dr Rayann Heman for symptomatic bradycardia  . Esophagogastroduodenoscopy N/A 12/16/2013    Procedure: ESOPHAGOGASTRODUODENOSCOPY (EGD);  Surgeon: Garlan Fair, MD;  Location: [facility];  Service: Endoscopy;  Laterality: N/A;  . Balloon dilation N/A 12/16/2013    Procedure: BALLOON DILATION;  Surgeon: Garlan Fair, MD;  Location: [facility];  Service: Endoscopy;  Laterality: N/A;  . Permanent pacemaker insertion N/A 10/04/2012    Procedure: PERMANENT PACEMAKER INSERTION;  Surgeon: Thompson Grayer, MD;  Location: [facility] CATH LAB;  Service: Cardiovascular;  Laterality: N/A;    Current Outpatient Prescriptions   Medication Sig Dispense Refill  . Coenzyme Q10 (CO Q 10 PO) Take 1 capsule by mouth daily.    . furosemide (LASIX) 20 MG tablet Take 20 mg by mouth every other day.    [name] glimepiride (AMARYL) 1 MG tablet Take 1 tablet by mouth every other morning at breakfast and 1 tablet by mouth at bedtime every other day    . nitroGLYCERIN (NITROSTAT) 0.4 MG SL tablet Place 1 tablet (0.4 mg total) under the tongue every 5 (five) minutes x 3 doses as needed for chest pain. 25 tablet 6  . ramipril (ALTACE) 2.5 MG capsule Take 1 capsule (2.5 mg total) by mouth daily. 30 capsule 11  . warfarin (COUMADIN) 5 MG tablet Take as directed by coumadin clinic 25 tablet 3   No current facility-administered medications for this visit.   ROS- all systems are reviewed and negative except as per HPI above.  In addition, he has recently had esophageal dilatation,  + leg weakness/neuropathy, difficulty urinating  Physical Exam: Filed Vitals:   12/22/14 0901  BP: 128/76  Pulse: 86  Height: 5' 9.5" (1.765 m)  Weight: 209 lb 6.4 oz (94.983 kg)    GEN- The patient is well appearing, alert and oriented x 3 today.   Head- normocephalic, atraumatic Eyes-  Sclera clear, conjunctiva pink Ears- hearing intact Oropharynx- clear Lungs- Clear to ausculation bilaterally, normal work of breathing Chest- pacemaker pocket is well healed Heart- Regular rate and rhythm (paced) GI- soft, NT,  ND, + BS Extremities- no clubbing, cyanosis, + chronic dependant edema  Pacemaker interrogation- reviewed in detail today,  See PACEART report  Assessment and Plan:  1. Symptomatic bradycardia Normal pacemaker function See Pace Art report No changes today  2. HTN Stable No change required today  3. Permanent afib On coumadin chads2vasc score is at least 5 He has also had prior DVT per med record  Carelink remotes every 3 months Return toe see EP NP in 1 year  Follow-up with Dr Irish Lack in 1 year  Thompson Grayer MD,  [facility] 12/22/2014 9:57 AM

## 2014-12-22 NOTE — Patient Instructions (Signed)
Medication Instructions:  Your physician recommends that you continue on your current medications as directed. Please refer to the Current Medication list given to you today.   Labwork: None ordered   Testing/Procedures: None ordered   Follow-Up: Your physician wants you to follow-up in: 6 months with Dr Irish Lack and 12 months with Chanetta Marshall, NP You will receive a reminder letter in the mail two months in advance. If you don't receive a letter, please call our office to schedule the follow-up appointment.    Remote monitoring is used to monitor your Pacemaker  from home. This monitoring reduces the number of office visits required to check your device to one time per year. It allows [place] to keep an eye on the functioning of your device to ensure it is working properly. You are scheduled for a device check from home on 03/23/15. You may send your transmission at any time that day. If you have a wireless device, the transmission will be sent automatically. After your physician reviews your transmission, you will receive a postcard with your next transmission date.     Any Other Special Instructions Will Be Listed Below (If Applicable).

## 2014-12-22 NOTE — ED Provider Notes (Signed)
CSN: [id number]     Arrival date & time 12/22/14  2129 History  By signing my name below, I, Eustaquio Maize, attest that this documentation has been prepared under the direction and in the presence of Delora Fuel, MD. Electronically Signed: Eustaquio Maize, ED Scribe. 12/23/2014. 1:56 AM.  Chief Complaint  Patient presents with  . Groin Pain    left  . Headache   The history is provided by the patient. No language interpreter was used.     HPI Comments: [name] is a 81 y.o. male with hx atrial fibrillation, CAD with pacemaker, and DM who presents to the Emergency Department complaining of sudden onset, constant, severe, left groin pain that began around 5 PM today (approximately 6.5 hours ago). The pain has eased off some since onset. There is no pain radiation. The pain is exacerbated with movement. Pt is also complaining of a headache. He took 2 Tylenol earlier tonight without relief. Wife reports that pt was having chills earlier but cannot say if pt had a fever at home. His temperature in the ED is 100.5 degrees. Pt denies abdominal pain, back pain, or any other associated symptoms.    Past Medical History  Diagnosis Date  . PVD (peripheral vascular disease) ([place])   . Permanent atrial fibrillation ([place])   . DVT (deep venous thrombosis) ([place])   . Coronary artery disease   . Aortic stenosis   . GERD (gastroesophageal reflux disease)   . BPH (benign prostatic hypertrophy)   . Kidney stones   . Diverticulosis   . Shortness of breath   . Diabetes mellitus without complication ([place])     type 2  . 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 Gout   . Dermatophytosis of nail   . Gastroesophageal reflux disease    Past Surgical History  Procedure Laterality Date  . Coronary artery bypass graft    . Aortic valve replacement    . Paraesophageal hernia repair    . Hernia repair    . Pacemaker  insertion  10-04-2012    Medtronic pacemaker implanted by Dr Rayann Heman for symptomatic bradycardia  . Esophagogastroduodenoscopy N/A 12/16/2013    Procedure: ESOPHAGOGASTRODUODENOSCOPY (EGD);  Surgeon: Garlan Fair, MD;  Location: [facility];  Service: Endoscopy;  Laterality: N/A;  . Balloon dilation N/A 12/16/2013    Procedure: BALLOON DILATION;  Surgeon: Garlan Fair, MD;  Location: [facility] ENDOSCOPY;  Service: Endoscopy;  Laterality: N/A;  . Permanent pacemaker insertion N/A 10/04/2012    Procedure: PERMANENT PACEMAKER INSERTION;  Surgeon: Thompson Grayer, MD;  Location: [facility] CATH LAB;  Service: Cardiovascular;  Laterality: N/A;   Family History  Problem Relation Age of Onset  . Cancer - Other Father    Social History  Substance Use Topics  . Smoking status: Never Smoker   . Smokeless tobacco: Never Used  . Alcohol Use: No    Review of Systems  All other systems reviewed and are negative.  Allergies  Demerol  Home Medications   Prior to Admission medications   Medication Sig Start Date End Date Taking? Authorizing Provider  Coenzyme Q10 (CO Q 10 PO) Take 1 capsule by mouth daily.   Yes Historical Provider, MD  furosemide (LASIX) 20 MG tablet Take 20 mg by mouth every other day.   Yes Historical Provider, MD  glimepiride (AMARYL) 1 MG tablet Take 1 mg by mouth 2 (  two) times daily.    Yes Historical Provider, MD  nitroGLYCERIN (NITROSTAT) 0.4 MG SL tablet Place 1 tablet (0.4 mg total) under the tongue every 5 (five) minutes x 3 doses as needed for chest pain. 10/05/12  Yes Jettie Booze, MD  ramipril (ALTACE) 2.5 MG capsule Take 1 capsule (2.5 mg total) by mouth daily. Patient taking differently: Take 2.5 mg by mouth every other day.  02/25/14  Yes Jettie Booze, MD  warfarin (COUMADIN) 5 MG tablet Take as directed by coumadin clinic Patient taking differently: Take 2.5 mg by mouth daily at 6 PM. Take as directed by coumadin clinic 09/24/14  Yes Jettie Booze, MD    Triage Vitals: Pulse 70  Temp(Src) 100.5 F (38.1 C) (Oral)  Resp 20  Ht 5\' 9"  (1.753 m)  Wt 209 lb (94.802 kg)  BMI 30.85 kg/m2  SpO2 94%   Physical Exam  Constitutional: He is oriented to person, place, and time. He appears well-developed and well-nourished. No distress.  HENT:  Head: Normocephalic and atraumatic.  Eyes: Conjunctivae and EOM are normal. Pupils are equal, round, and reactive to light.  Neck: Normal range of motion. Neck supple. No JVD present.  Cardiovascular: Normal rate and regular rhythm.   Murmur heard. 2/6 systolic ejection murmur along the left sternal border  Pulmonary/Chest: Effort normal and breath sounds normal. He has no wheezes. He has no rales. He exhibits no tenderness.  Pacemaker present left subclavian area  Abdominal: Soft. Bowel sounds are normal. He exhibits no distension and no mass. There is tenderness. There is no rebound and no guarding.  Localized tenderness over the left suprapubic area  Genitourinary: Uncircumcised.  Testes descended without masses or tenderness No hernia  Musculoskeletal: Normal range of motion. He exhibits edema.  2+ edema right leg 3+ edema left leg  Lymphadenopathy:    He has no cervical adenopathy.  Neurological: He is alert and oriented to person, place, and time. No cranial nerve deficit. He exhibits normal muscle tone. Coordination normal.  Left foot drop  Skin: Skin is warm and dry. No rash noted.  Psychiatric: He has a normal mood and affect. His behavior is normal. Judgment and thought content normal.  Nursing note and vitals reviewed.   ED Course  Procedures (including critical care time)  DIAGNOSTIC STUDIES: Oxygen Saturation is 94% on [place], adequate by my interpretation.    COORDINATION OF CARE: 11:31 PM-Discussed treatment plan which includes CXR, Lactic acid, Blood culture, UA, Urine culture, CMP, CBC with pt at bedside and pt agreed to plan.   Labs Review Labs Reviewed  CULTURE, BLOOD  (ROUTINE X 2)  CULTURE, BLOOD (ROUTINE X 2)  URINE CULTURE  URINALYSIS, ROUTINE W REFLEX MICROSCOPIC (NOT AT [facility])  COMPREHENSIVE METABOLIC PANEL  CBC WITH DIFFERENTIAL/PLATELET  I-STAT CG4 LACTIC ACID, ED    Imaging Review  Ct Abdomen Pelvis Wo Contrast  12/23/2014  CLINICAL DATA:  Acute onset of left inguinal pain. Initial encounter. EXAM: CT ABDOMEN AND PELVIS WITHOUT CONTRAST TECHNIQUE: Multidetector CT imaging of the abdomen and pelvis was performed following the standard protocol without IV contrast. COMPARISON:  MRI of the lumbar spine performed 11/24/2006 FINDINGS: Left basilar atelectasis is noted. Scattered coronary artery calcifications are seen. Postoperative change is noted at the aortic valve. The patient is status post median sternotomy. A pacemaker lead is partially imaged. There is marked elevation of the left hemidiaphragm, with displacement of the stomach, head and proximal body of the pancreas, small and large bowel  loops and spleen superiorly. Surrounding postoperative change is noted. There is rightward displacement of the heart and distal descending thoracic aorta. The liver and spleen are unremarkable in appearance. The gallbladder is within normal limits. The pancreas and adrenal glands are unremarkable. A 1.2 cm cyst is noted at the interpole region of the right kidney. Nonspecific perinephric stranding is noted bilaterally, more prominent on the right. There is no evidence of hydronephrosis. A 3 mm stone is noted at the interpole region of the left kidney. No obstructing ureteral stones are seen. No free fluid is identified. The small bowel is unremarkable in appearance. The stomach is within normal limits. No acute vascular abnormalities are seen. Scattered calcification is noted along the abdominal aorta and its branches. The appendix is normal in caliber, without evidence of appendicitis. The colon is grossly unremarkable in appearance. The bladder is mildly distended and  grossly unremarkable. The prostate remains normal in size. No inguinal lymphadenopathy is seen. The left inguinal region is unremarkable in appearance. No acute osseous abnormalities are identified. IMPRESSION: 1. Left inguinal region is unremarkable in appearance. 2. Marked elevation of the left hemidiaphragm, with displacement of the stomach, head and proximal body of the pancreas, small and large bowel loops and spleen superiorly. Surrounding postoperative change noted. Rightward displacement of the heart and distal descending thoracic aorta. 3. Small right renal cyst noted. 3 mm nonobstructing stone at the interpole region of the left kidney. 4. Scattered calcification along the abdominal aorta and its branches. 5. Left basilar atelectasis noted. 6. Scattered coronary artery calcifications seen. Electronically Signed   By: Garald Balding M.D.   On: 12/23/2014 01:33   Dg Chest Port 1 View  12/22/2014  CLINICAL DATA:  81 year old male with fever at 5 p.m. today. Currently being treated for kidney stones. EXAM: PORTABLE CHEST 1 VIEW COMPARISON:  Chest x-ray 06/2013 FINDINGS: Elevation of left hemidiaphragm. Opacities at the left lung base may reflect atelectasis and/or scarring, and are similar to the prior examinations. Possible small chronic left pleural effusion. Right lung appears clear. Mild diffuse coarsening of interstitial markings appears to be chronic. Mild cephalization of the pulmonary vasculature. Heart size is mildly enlarged. The patient is rotated to the left on today's exam, resulting in distortion of the mediastinal contours and reduced diagnostic sensitivity and specificity for mediastinal pathology. Atherosclerosis in the thoracic aorta. Status post median sternotomy for CABG and aortic valve replacement (a stented bioprosthesis is noted). Left-sided pacemaker device in position with lead tip projecting over the expected location of the right ventricular apex. IMPRESSION: 1. The appearance  the chest again suggests mild congestive heart failure, as above. 2. Chronic elevation of left hemidiaphragm is unchanged and again associated with extensive areas of chronic scarring or subsegmental atelectasis in the left lower lobe. Probable small chronic left pleural effusion also noted. 3. Atherosclerosis. 4. Postoperative changes and support apparatus, as above. Electronically Signed   By: Vinnie Langton M.D.   On: 12/22/2014 23:50     I have personally reviewed and evaluated these images and lab results as part of my medical decision-making.   EKG Interpretation Atrial fibrillation 89 bpm. Right axis deviation. Low voltage. T-wave inversions in inferior and anterolateral leads. Prolonged QT interval. When compared with ECG of 10/22/2014, atrial fibrillation has replaced ventricular demand pacing.  CRITICAL CARE Performed by: Delora Fuel Total critical care time: 45 minutes Critical care time was exclusive of separately billable procedures and treating other patients. Critical care was necessary to treat or prevent imminent  or life-threatening deterioration. Critical care was time spent personally by me on the following activities: development of treatment plan with patient and/or surrogate as well as nursing, discussions with consultants, evaluation of patient's response to treatment, examination of patient, obtaining history from patient or surrogate, ordering and performing treatments and interventions, ordering and review of laboratory studies, ordering and review of radiographic studies, pulse oximetry and re-evaluation of patient's condition. MDM   Final diagnoses:  Fever, unspecified  Left lower quadrant pain  Elevated lactic acid level  Renal insufficiency    Left inguinal pain of uncertain cause. Patient is clearly very uncomfortable. No scrotal lesions seen. Kidneys and for a CT scan which is unremarkable. He had fever and chills at home also blood cultures had been  obtained. Urinalysis showed no signs of infection but urine was also sent for culture. Lactic acid level has come back normal. Source of pain was not clear. After all this testing, he started having increased pain and tachypnea and oxygen levels started to drop. He had initially been given IV hydration. His lungs continued to be clear. However, repeat chest x-ray showed increasing pulmonary vascular congestion consistent with early pulmonary edema. He was given additional morphine for pain, and was given furosemide. He had chills, and then spiked a fever to  104. Lactic acid level was repeated, and is now up to 2.8. He is not tolerating IV hydration, as he is starting to develop pulmonary edema,  so additional fluids are not given. He is started on empiric antibiotics of vancomycin and Zosyn. He was sent for scrotal ultrasound to make sure that there was not something that was missed on physical exam, and this is unremarkable. Case is discussed with  Dr. Hal Hope of [facility] who agrees to admit the patient.    I personally performed the services described in this documentation, which was scribed in my presence. The recorded information has been reviewed and is accurate.        Delora Fuel, MD 02/72/53 [id number]

## 2014-12-22 NOTE — ED Notes (Addendum)
Patient c/o headache and left groin pain, onset 1700. Patient states he took 2 tylenol at that time.

## 2014-12-23 ENCOUNTER — Inpatient Hospital Stay (HOSPITAL_COMMUNITY): Payer: Medicare Other

## 2014-12-23 ENCOUNTER — Emergency Department (HOSPITAL_COMMUNITY): Payer: Medicare Other

## 2014-12-23 ENCOUNTER — Encounter (HOSPITAL_COMMUNITY): Payer: Self-pay | Admitting: Internal Medicine

## 2014-12-23 DIAGNOSIS — N183 Chronic kidney disease, stage 3 unspecified: Secondary | ICD-10-CM | POA: Diagnosis present

## 2014-12-23 DIAGNOSIS — I739 Peripheral vascular disease, unspecified: Secondary | ICD-10-CM | POA: Diagnosis present

## 2014-12-23 DIAGNOSIS — E1169 Type 2 diabetes mellitus with other specified complication: Secondary | ICD-10-CM | POA: Diagnosis present

## 2014-12-23 DIAGNOSIS — Z950 Presence of cardiac pacemaker: Secondary | ICD-10-CM | POA: Diagnosis not present

## 2014-12-23 DIAGNOSIS — E119 Type 2 diabetes mellitus without complications: Secondary | ICD-10-CM | POA: Diagnosis not present

## 2014-12-23 DIAGNOSIS — R7989 Other specified abnormal findings of blood chemistry: Secondary | ICD-10-CM | POA: Insufficient documentation

## 2014-12-23 DIAGNOSIS — Z452 Encounter for adjustment and management of vascular access device: Secondary | ICD-10-CM | POA: Diagnosis not present

## 2014-12-23 DIAGNOSIS — M6281 Muscle weakness (generalized): Secondary | ICD-10-CM | POA: Diagnosis not present

## 2014-12-23 DIAGNOSIS — J810 Acute pulmonary edema: Secondary | ICD-10-CM

## 2014-12-23 DIAGNOSIS — J811 Chronic pulmonary edema: Secondary | ICD-10-CM | POA: Insufficient documentation

## 2014-12-23 DIAGNOSIS — M25559 Pain in unspecified hip: Secondary | ICD-10-CM | POA: Diagnosis not present

## 2014-12-23 DIAGNOSIS — R51 Headache: Secondary | ICD-10-CM | POA: Diagnosis not present

## 2014-12-23 DIAGNOSIS — J9811 Atelectasis: Secondary | ICD-10-CM | POA: Diagnosis present

## 2014-12-23 DIAGNOSIS — Z79899 Other long term (current) drug therapy: Secondary | ICD-10-CM | POA: Diagnosis not present

## 2014-12-23 DIAGNOSIS — A419 Sepsis, unspecified organism: Secondary | ICD-10-CM | POA: Diagnosis not present

## 2014-12-23 DIAGNOSIS — E872 Acidosis: Secondary | ICD-10-CM | POA: Diagnosis present

## 2014-12-23 DIAGNOSIS — Z86718 Personal history of other venous thrombosis and embolism: Secondary | ICD-10-CM | POA: Diagnosis not present

## 2014-12-23 DIAGNOSIS — R4182 Altered mental status, unspecified: Secondary | ICD-10-CM | POA: Diagnosis not present

## 2014-12-23 DIAGNOSIS — R3911 Hesitancy of micturition: Secondary | ICD-10-CM | POA: Diagnosis present

## 2014-12-23 DIAGNOSIS — A4189 Other specified sepsis: Secondary | ICD-10-CM | POA: Diagnosis not present

## 2014-12-23 DIAGNOSIS — Z953 Presence of xenogenic heart valve: Secondary | ICD-10-CM | POA: Diagnosis not present

## 2014-12-23 DIAGNOSIS — Z87442 Personal history of urinary calculi: Secondary | ICD-10-CM | POA: Diagnosis not present

## 2014-12-23 DIAGNOSIS — R627 Adult failure to thrive: Secondary | ICD-10-CM | POA: Diagnosis not present

## 2014-12-23 DIAGNOSIS — R509 Fever, unspecified: Secondary | ICD-10-CM

## 2014-12-23 DIAGNOSIS — M25551 Pain in right hip: Secondary | ICD-10-CM | POA: Diagnosis not present

## 2014-12-23 DIAGNOSIS — R29898 Other symptoms and signs involving the musculoskeletal system: Secondary | ICD-10-CM | POA: Diagnosis not present

## 2014-12-23 DIAGNOSIS — M21372 Foot drop, left foot: Secondary | ICD-10-CM | POA: Diagnosis present

## 2014-12-23 DIAGNOSIS — N179 Acute kidney failure, unspecified: Secondary | ICD-10-CM | POA: Diagnosis present

## 2014-12-23 DIAGNOSIS — E1159 Type 2 diabetes mellitus with other circulatory complications: Secondary | ICD-10-CM | POA: Diagnosis not present

## 2014-12-23 DIAGNOSIS — R0602 Shortness of breath: Secondary | ICD-10-CM | POA: Diagnosis not present

## 2014-12-23 DIAGNOSIS — I509 Heart failure, unspecified: Secondary | ICD-10-CM | POA: Diagnosis not present

## 2014-12-23 DIAGNOSIS — N401 Enlarged prostate with lower urinary tract symptoms: Secondary | ICD-10-CM | POA: Diagnosis present

## 2014-12-23 DIAGNOSIS — R1030 Lower abdominal pain, unspecified: Secondary | ICD-10-CM | POA: Diagnosis not present

## 2014-12-23 DIAGNOSIS — R6510 Systemic inflammatory response syndrome (SIRS) of non-infectious origin without acute organ dysfunction: Secondary | ICD-10-CM | POA: Diagnosis not present

## 2014-12-23 DIAGNOSIS — R109 Unspecified abdominal pain: Secondary | ICD-10-CM | POA: Diagnosis not present

## 2014-12-23 DIAGNOSIS — K5900 Constipation, unspecified: Secondary | ICD-10-CM | POA: Diagnosis present

## 2014-12-23 DIAGNOSIS — E1122 Type 2 diabetes mellitus with diabetic chronic kidney disease: Secondary | ICD-10-CM | POA: Diagnosis present

## 2014-12-23 DIAGNOSIS — Z7901 Long term (current) use of anticoagulants: Secondary | ICD-10-CM | POA: Diagnosis not present

## 2014-12-23 DIAGNOSIS — Z952 Presence of prosthetic heart valve: Secondary | ICD-10-CM | POA: Diagnosis not present

## 2014-12-23 DIAGNOSIS — M79604 Pain in right leg: Secondary | ICD-10-CM | POA: Diagnosis not present

## 2014-12-23 DIAGNOSIS — I10 Essential (primary) hypertension: Secondary | ICD-10-CM | POA: Diagnosis not present

## 2014-12-23 DIAGNOSIS — I2510 Atherosclerotic heart disease of native coronary artery without angina pectoris: Secondary | ICD-10-CM | POA: Diagnosis present

## 2014-12-23 DIAGNOSIS — I959 Hypotension, unspecified: Secondary | ICD-10-CM | POA: Diagnosis present

## 2014-12-23 DIAGNOSIS — M25552 Pain in left hip: Secondary | ICD-10-CM

## 2014-12-23 DIAGNOSIS — K219 Gastro-esophageal reflux disease without esophagitis: Secondary | ICD-10-CM | POA: Diagnosis present

## 2014-12-23 DIAGNOSIS — I482 Chronic atrial fibrillation: Secondary | ICD-10-CM | POA: Diagnosis present

## 2014-12-23 DIAGNOSIS — Z951 Presence of aortocoronary bypass graft: Secondary | ICD-10-CM | POA: Diagnosis not present

## 2014-12-23 DIAGNOSIS — I130 Hypertensive heart and chronic kidney disease with heart failure and stage 1 through stage 4 chronic kidney disease, or unspecified chronic kidney disease: Secondary | ICD-10-CM | POA: Diagnosis present

## 2014-12-23 DIAGNOSIS — I5032 Chronic diastolic (congestive) heart failure: Secondary | ICD-10-CM | POA: Diagnosis present

## 2014-12-23 DIAGNOSIS — A4901 Methicillin susceptible Staphylococcus aureus infection, unspecified site: Secondary | ICD-10-CM | POA: Diagnosis not present

## 2014-12-23 DIAGNOSIS — A4101 Sepsis due to Methicillin susceptible Staphylococcus aureus: Secondary | ICD-10-CM | POA: Diagnosis not present

## 2014-12-23 DIAGNOSIS — R791 Abnormal coagulation profile: Secondary | ICD-10-CM | POA: Diagnosis present

## 2014-12-23 DIAGNOSIS — M47816 Spondylosis without myelopathy or radiculopathy, lumbar region: Secondary | ICD-10-CM | POA: Diagnosis not present

## 2014-12-23 DIAGNOSIS — M79605 Pain in left leg: Secondary | ICD-10-CM | POA: Diagnosis not present

## 2014-12-23 DIAGNOSIS — I340 Nonrheumatic mitral (valve) insufficiency: Secondary | ICD-10-CM | POA: Diagnosis not present

## 2014-12-23 LAB — CBC WITH DIFFERENTIAL/PLATELET
Basophils Absolute: 0 10*3/uL (ref 0.0–0.1)
Basophils Absolute: 0 10*3/uL (ref 0.0–0.1)
Basophils Relative: 0 %
Basophils Relative: 0 %
EOS ABS: 0 10*3/uL (ref 0.0–0.7)
EOS PCT: 0 %
Eosinophils Absolute: 0 10*3/uL (ref 0.0–0.7)
Eosinophils Relative: 0 %
HCT: 39.7 % (ref 39.0–52.0)
HCT: 42.5 % (ref 39.0–52.0)
HEMOGLOBIN: 12.7 g/dL — AB (ref 13.0–17.0)
HEMOGLOBIN: 13.5 g/dL (ref 13.0–17.0)
LYMPHS ABS: 0.3 10*3/uL — AB (ref 0.7–4.0)
LYMPHS ABS: 0.4 10*3/uL — AB (ref 0.7–4.0)
LYMPHS PCT: 3 %
Lymphocytes Relative: 4 %
MCH: 31.3 pg (ref 26.0–34.0)
MCH: 31.5 pg (ref 26.0–34.0)
MCHC: 31.8 g/dL (ref 30.0–36.0)
MCHC: 32 g/dL (ref 30.0–36.0)
MCV: 98.5 fL (ref 78.0–100.0)
MCV: 98.6 fL (ref 78.0–100.0)
MONO ABS: 0.4 10*3/uL (ref 0.1–1.0)
Monocytes Absolute: 0.7 10*3/uL (ref 0.1–1.0)
Monocytes Relative: 4 %
Monocytes Relative: 7 %
NEUTROS PCT: 89 %
Neutro Abs: 8.5 10*3/uL — ABNORMAL HIGH (ref 1.7–7.7)
Neutro Abs: 9.8 10*3/uL — ABNORMAL HIGH (ref 1.7–7.7)
Neutrophils Relative %: 93 %
PLATELETS: 170 10*3/uL (ref 150–400)
Platelets: 132 10*3/uL — ABNORMAL LOW (ref 150–400)
RBC: 4.03 MIL/uL — AB (ref 4.22–5.81)
RBC: 4.31 MIL/uL (ref 4.22–5.81)
RDW: 13.7 % (ref 11.5–15.5)
RDW: 13.9 % (ref 11.5–15.5)
WBC: 10.4 10*3/uL (ref 4.0–10.5)
WBC: 9.6 10*3/uL (ref 4.0–10.5)

## 2014-12-23 LAB — INFLUENZA PANEL BY PCR (TYPE A & B)
H1N1FLUPCR: NOT DETECTED
INFLAPCR: NEGATIVE
INFLBPCR: NEGATIVE

## 2014-12-23 LAB — COMPREHENSIVE METABOLIC PANEL
ALBUMIN: 3.3 g/dL — AB (ref 3.5–5.0)
ALK PHOS: 38 U/L (ref 38–126)
ALK PHOS: 37 U/L — AB (ref 38–126)
ALT: 13 U/L — AB (ref 17–63)
ALT: 19 U/L (ref 17–63)
ALT: 19 U/L (ref 17–63)
ANION GAP: 8 (ref 5–15)
AST: 23 U/L (ref 15–41)
AST: 39 U/L (ref 15–41)
AST: 41 U/L (ref 15–41)
Albumin: 3.5 g/dL (ref 3.5–5.0)
Albumin: 3.9 g/dL (ref 3.5–5.0)
Alkaline Phosphatase: 42 U/L (ref 38–126)
Anion gap: 7 (ref 5–15)
Anion gap: 8 (ref 5–15)
BUN: 27 mg/dL — ABNORMAL HIGH (ref 6–20)
BUN: 28 mg/dL — ABNORMAL HIGH (ref 6–20)
BUN: 29 mg/dL — AB (ref 6–20)
CALCIUM: 8.3 mg/dL — AB (ref 8.9–10.3)
CHLORIDE: 106 mmol/L (ref 101–111)
CHLORIDE: 108 mmol/L (ref 101–111)
CO2: 23 mmol/L (ref 22–32)
CO2: 25 mmol/L (ref 22–32)
CO2: 27 mmol/L (ref 22–32)
CREATININE: 1.58 mg/dL — AB (ref 0.61–1.24)
CREATININE: 1.73 mg/dL — AB (ref 0.61–1.24)
CREATININE: 1.85 mg/dL — AB (ref 0.61–1.24)
Calcium: 8.1 mg/dL — ABNORMAL LOW (ref 8.9–10.3)
Calcium: 8.9 mg/dL (ref 8.9–10.3)
Chloride: 108 mmol/L (ref 101–111)
GFR calc Af Amer: 36 mL/min — ABNORMAL LOW (ref >60)
GFR calc non Af Amer: 31 mL/min — ABNORMAL LOW (ref >60)
GFR, EST AFRICAN AMERICAN: 39 mL/min — AB (ref >60)
GFR, EST AFRICAN AMERICAN: 44 mL/min — AB (ref >60)
GFR, EST NON AFRICAN AMERICAN: 34 mL/min — AB (ref >60)
GFR, EST NON AFRICAN AMERICAN: 38 mL/min — AB (ref >60)
GLUCOSE: 114 mg/dL — AB (ref 65–99)
Glucose, Bld: 182 mg/dL — ABNORMAL HIGH (ref 65–99)
Glucose, Bld: 202 mg/dL — ABNORMAL HIGH (ref 65–99)
POTASSIUM: 3.9 mmol/L (ref 3.5–5.1)
Potassium: 3.8 mmol/L (ref 3.5–5.1)
Potassium: 3.8 mmol/L (ref 3.5–5.1)
SODIUM: 141 mmol/L (ref 135–145)
Sodium: 139 mmol/L (ref 135–145)
Sodium: 140 mmol/L (ref 135–145)
Total Bilirubin: 1.2 mg/dL (ref 0.3–1.2)
Total Bilirubin: 1.3 mg/dL — ABNORMAL HIGH (ref 0.3–1.2)
Total Bilirubin: 1.6 mg/dL — ABNORMAL HIGH (ref 0.3–1.2)
Total Protein: 6 g/dL — ABNORMAL LOW (ref 6.5–8.1)
Total Protein: 6.4 g/dL — ABNORMAL LOW (ref 6.5–8.1)
Total Protein: 7.2 g/dL (ref 6.5–8.1)

## 2014-12-23 LAB — URINALYSIS, ROUTINE W REFLEX MICROSCOPIC
BILIRUBIN URINE: NEGATIVE
GLUCOSE, UA: 100 mg/dL — AB
Glucose, UA: NEGATIVE mg/dL
HGB URINE DIPSTICK: NEGATIVE
HGB URINE DIPSTICK: NEGATIVE
KETONES UR: NEGATIVE mg/dL
KETONES UR: NEGATIVE mg/dL
Leukocytes, UA: NEGATIVE
Leukocytes, UA: NEGATIVE
Nitrite: NEGATIVE
Nitrite: NEGATIVE
PH: 5 (ref 5.0–8.0)
PROTEIN: NEGATIVE mg/dL
Protein, ur: NEGATIVE mg/dL
SPECIFIC GRAVITY, URINE: 1.01 (ref 1.005–1.030)
Specific Gravity, Urine: 1.027 (ref 1.005–1.030)
UROBILINOGEN UA: 0.2 mg/dL (ref 0.0–1.0)
Urobilinogen, UA: 1 mg/dL (ref 0.0–1.0)
pH: 5 (ref 5.0–8.0)

## 2014-12-23 LAB — HEPATIC FUNCTION PANEL
ALBUMIN: 3.1 g/dL — AB (ref 3.5–5.0)
ALK PHOS: 30 U/L — AB (ref 38–126)
ALT: 22 U/L (ref 17–63)
AST: 48 U/L — AB (ref 15–41)
Bilirubin, Direct: 0.3 mg/dL (ref 0.1–0.5)
Indirect Bilirubin: 1.2 mg/dL — ABNORMAL HIGH (ref 0.3–0.9)
TOTAL PROTEIN: 5.9 g/dL — AB (ref 6.5–8.1)
Total Bilirubin: 1.5 mg/dL — ABNORMAL HIGH (ref 0.3–1.2)

## 2014-12-23 LAB — I-STAT CG4 LACTIC ACID, ED
LACTIC ACID, VENOUS: 1.88 mmol/L (ref 0.5–2.0)
Lactic Acid, Venous: 2.61 mmol/L (ref 0.5–2.0)

## 2014-12-23 LAB — PROTIME-INR
INR: 4.38 — AB (ref 0.00–1.49)
PROTHROMBIN TIME: 40.7 s — AB (ref 11.6–15.2)

## 2014-12-23 LAB — PROCALCITONIN: PROCALCITONIN: 1.7 ng/mL

## 2014-12-23 LAB — CBG MONITORING, ED
Glucose-Capillary: 167 mg/dL — ABNORMAL HIGH (ref 65–99)
Glucose-Capillary: 105 mg/dL — ABNORMAL HIGH (ref 65–99)

## 2014-12-23 LAB — LACTIC ACID, PLASMA
LACTIC ACID, VENOUS: 2.7 mmol/L — AB (ref 0.5–2.0)
Lactic Acid, Venous: 2.3 mmol/L (ref 0.5–2.0)

## 2014-12-23 LAB — MRSA PCR SCREENING: MRSA by PCR: NEGATIVE

## 2014-12-23 LAB — APTT

## 2014-12-23 LAB — GLUCOSE, CAPILLARY: Glucose-Capillary: 122 mg/dL — ABNORMAL HIGH (ref 65–99)

## 2014-12-23 LAB — TYPE AND SCREEN
ABO/RH(D): O POS
Antibody Screen: NEGATIVE

## 2014-12-23 LAB — BRAIN NATRIURETIC PEPTIDE: B NATRIURETIC PEPTIDE 5: 306.2 pg/mL — AB (ref 0.0–100.0)

## 2014-12-23 MED ORDER — ACETAMINOPHEN 325 MG PO TABS
650.0000 mg | ORAL_TABLET | Freq: Four times a day (QID) | ORAL | Status: DC | PRN
  Administered 2014-12-24 – 2014-12-29 (×2): 650 mg via ORAL
  Filled 2014-12-23 (×2): qty 2

## 2014-12-23 MED ORDER — VANCOMYCIN HCL 10 G IV SOLR: 1250.0000 mg | INTRAVENOUS | Status: DC

## 2014-12-23 MED ORDER — DEXTROSE 5 % IV SOLN: 2.0000 g | Freq: Two times a day (BID) | INTRAVENOUS | Status: DC

## 2014-12-23 MED ORDER — DEXTROSE 5 % IV SOLN: 1.0000 g | Freq: Two times a day (BID) | INTRAVENOUS | Status: DC

## 2014-12-23 MED ORDER — ONDANSETRON HCL 4 MG/2ML IJ SOLN: 4.0000 mg | Freq: Four times a day (QID) | INTRAMUSCULAR | Status: DC | PRN

## 2014-12-23 MED ORDER — SODIUM CHLORIDE 0.9 % IV SOLN
2.0000 g | INTRAVENOUS | Status: DC
  Administered 2014-12-23 (×2): 2 g via INTRAVENOUS
  Filled 2014-12-23 (×5): qty 2000

## 2014-12-23 MED ORDER — ONDANSETRON HCL 4 MG/2ML IJ SOLN: 4.0000 mg | Freq: Three times a day (TID) | INTRAMUSCULAR | Status: DC | PRN

## 2014-12-23 MED ORDER — MORPHINE SULFATE (PF) 4 MG/ML IV SOLN
4.0000 mg | INTRAVENOUS | Status: DC | PRN
Start: 2014-12-23 — End: 2014-12-23

## 2014-12-23 MED ORDER — SODIUM CHLORIDE 0.9 % IJ SOLN
3.0000 mL | Freq: Two times a day (BID) | INTRAMUSCULAR | Status: DC
  Administered 2014-12-24 – 2014-12-30 (×7): 3 mL via INTRAVENOUS

## 2014-12-23 MED ORDER — PIPERACILLIN-TAZOBACTAM 3.375 G IVPB 30 MIN
3.3750 g | Freq: Once | INTRAVENOUS | Status: AC
  Administered 2014-12-23: 3.375 g via INTRAVENOUS
  Filled 2014-12-23: qty 50

## 2014-12-23 MED ORDER — OXYCODONE-ACETAMINOPHEN 5-325 MG PO TABS
1.0000 | ORAL_TABLET | Freq: Three times a day (TID) | ORAL | Status: DC | PRN
  Administered 2014-12-23: 1 via ORAL
  Filled 2014-12-23: qty 1

## 2014-12-23 MED ORDER — INSULIN ASPART 100 UNIT/ML ~~LOC~~ SOLN
0.0000 [IU] | Freq: Three times a day (TID) | SUBCUTANEOUS | Status: DC
  Administered 2014-12-23: 1 [IU] via SUBCUTANEOUS
  Administered 2014-12-23 – 2014-12-24 (×3): 2 [IU] via SUBCUTANEOUS
  Administered 2014-12-26: 3 [IU] via SUBCUTANEOUS
  Administered 2014-12-26: 1 [IU] via SUBCUTANEOUS
  Administered 2014-12-27: 2 [IU] via SUBCUTANEOUS
  Administered 2014-12-27: 3 [IU] via SUBCUTANEOUS
  Administered 2014-12-27 – 2014-12-28 (×4): 2 [IU] via SUBCUTANEOUS
  Administered 2014-12-29: 5 [IU] via SUBCUTANEOUS
  Administered 2014-12-29: 2 [IU] via SUBCUTANEOUS
  Administered 2014-12-29: 3 [IU] via SUBCUTANEOUS
  Administered 2014-12-30 (×2): 2 [IU] via SUBCUTANEOUS
  Administered 2014-12-30: 7 [IU] via SUBCUTANEOUS
  Filled 2014-12-23: qty 1

## 2014-12-23 MED ORDER — PERFLUTREN LIPID MICROSPHERE
1.0000 mL | INTRAVENOUS | Status: AC | PRN
  Administered 2014-12-23: 2 mL via INTRAVENOUS
  Filled 2014-12-23: qty 10

## 2014-12-23 MED ORDER — ACETAMINOPHEN 325 MG PO TABS
650.0000 mg | ORAL_TABLET | Freq: Once | ORAL | Status: AC
  Administered 2014-12-23: 650 mg via ORAL
  Filled 2014-12-23: qty 2

## 2014-12-23 MED ORDER — GLIMEPIRIDE 1 MG PO TABS
1.0000 mg | ORAL_TABLET | Freq: Two times a day (BID) | ORAL | Status: DC
  Filled 2014-12-23 (×3): qty 1

## 2014-12-23 MED ORDER — FUROSEMIDE 10 MG/ML IJ SOLN
40.0000 mg | Freq: Once | INTRAMUSCULAR | Status: AC
  Administered 2014-12-23: 40 mg via INTRAVENOUS
  Filled 2014-12-23: qty 4

## 2014-12-23 MED ORDER — DEXTROSE 5 % IV SOLN
2.0000 g | Freq: Two times a day (BID) | INTRAVENOUS | Status: DC
  Administered 2014-12-23: 2 g via INTRAVENOUS
  Filled 2014-12-23: qty 2

## 2014-12-23 MED ORDER — ACETAMINOPHEN 650 MG RE SUPP
650.0000 mg | Freq: Four times a day (QID) | RECTAL | Status: DC | PRN
  Administered 2014-12-23: 650 mg via RECTAL
  Filled 2014-12-23: qty 1

## 2014-12-23 MED ORDER — VANCOMYCIN HCL IN DEXTROSE 1-5 GM/200ML-% IV SOLN
1000.0000 mg | Freq: Once | INTRAVENOUS | Status: AC
  Administered 2014-12-23: 1000 mg via INTRAVENOUS
  Filled 2014-12-23: qty 200

## 2014-12-23 MED ORDER — MORPHINE SULFATE (PF) 4 MG/ML IV SOLN
4.0000 mg | Freq: Once | INTRAVENOUS | Status: AC
  Administered 2014-12-23: 4 mg via INTRAVENOUS
  Filled 2014-12-23: qty 1

## 2014-12-23 MED ORDER — RAMIPRIL 2.5 MG PO CAPS
2.5000 mg | ORAL_CAPSULE | ORAL | Status: DC
  Filled 2014-12-23: qty 1

## 2014-12-23 MED ORDER — NITROGLYCERIN 0.4 MG SL SUBL: 0.4000 mg | SUBLINGUAL_TABLET | SUBLINGUAL | Status: DC | PRN

## 2014-12-23 MED ORDER — PERFLUTREN LIPID MICROSPHERE
INTRAVENOUS | Status: AC
  Filled 2014-12-23: qty 10

## 2014-12-23 MED ORDER — ONDANSETRON HCL 4 MG PO TABS: 4.0000 mg | ORAL_TABLET | Freq: Four times a day (QID) | ORAL | Status: DC | PRN

## 2014-12-23 MED ORDER — DEXTROSE 5 % IV SOLN
2.0000 g | INTRAVENOUS | Status: DC
  Administered 2014-12-24 – 2014-12-25 (×2): 2 g via INTRAVENOUS
  Filled 2014-12-23 (×2): qty 2

## 2014-12-23 MED ORDER — SODIUM CHLORIDE 0.9 % IV SOLN
1250.0000 mg | INTRAVENOUS | Status: DC
  Administered 2014-12-24 – 2014-12-25 (×2): 1250 mg via INTRAVENOUS
  Filled 2014-12-23: qty 1250
  Filled 2014-12-23: qty 750

## 2014-12-23 MED ORDER — DEXTROSE 5 % IV SOLN
705.0000 mg | Freq: Two times a day (BID) | INTRAVENOUS | Status: DC
  Administered 2014-12-23: 705 mg via INTRAVENOUS
  Filled 2014-12-23 (×2): qty 14.1

## 2014-12-23 MED ORDER — SODIUM CHLORIDE 0.9 % IV SOLN
INTRAVENOUS | Status: DC
  Administered 2014-12-23 – 2014-12-28 (×8): via INTRAVENOUS

## 2014-12-23 NOTE — Progress Notes (Signed)
Received call from Rutherford Nail, RN regarding critical lab value blood cultures [facility] in [facility] and notified eMD Dr. Halford Chessman.

## 2014-12-23 NOTE — Progress Notes (Signed)
ANTICOAGULATION CONSULT NOTE - Initial Consult  Pharmacy Consult for Heparin if INR < 2.0 Indication: atrial fibrillation  Allergies  Allergen Reactions  . Demerol [Meperidine] Other (See Comments)    "hallucinations"    Patient Measurements: Height: 5\' 9"  (175.3 cm) Weight: 209 lb (94.802 kg) IBW/kg (Calculated) : 70.7 Heparin Dosing Weight: 90 kg  Vital Signs: Temp: 104.8 F (40.4 C) (10/25 0625) Temp Source: Rectal (10/25 0625) BP: 92/45 mmHg (10/25 0744) Pulse Rate: 60 (10/25 0744)  Labs:  Recent Labs  12/22/14 0905 12/22/14 2345 12/23/14 0648  HGB  --  13.5 12.7*  HCT  --  42.5 39.7  PLT  --  170 132*  LABPROT  --   --  40.7*  INR 1.9  --  4.38*  CREATININE  --  1.58* 1.73*    Estimated Creatinine Clearance: 34.8 mL/min (by C-G formula based on Cr of 1.73).   Medical History: Past Medical History  Diagnosis Date  . PVD (peripheral vascular disease) ([place])   . Permanent atrial fibrillation ([place])   . DVT (deep venous thrombosis) ([place])   . Coronary artery disease   . Aortic stenosis   . GERD (gastroesophageal reflux disease)   . BPH (benign prostatic hypertrophy)   . Kidney stones   . Diverticulosis   . Shortness of breath   . Diabetes mellitus without complication ([place])     type 2  . 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 Gout   . Dermatophytosis of nail   . Gastroesophageal reflux disease     Medications:  Scheduled:  . glimepiride  1 mg Oral BID  . insulin aspart  0-9 Units Subcutaneous TID WC  . ramipril  2.5 mg Oral QODAY  . sodium chloride  3 mL Intravenous Q12H   Infusions:  . acyclovir 705 mg (12/23/14 0746)  . ampicillin (OMNIPEN) IV    . cefTRIAXone (ROCEPHIN)  IV 2 g (12/23/14 0704)  . [START ON 12/24/2014] vancomycin     PRN: acetaminophen **OR** acetaminophen, nitroGLYCERIN, ondansetron **OR** ondansetron (ZOFRAN) IV  Assessment: 81 yo  male with with history of CAD s/p CABG, bioprosthetic aortic valve, symptomatic bradycardia s/p pacemaker, CKD, DM2 and afib on chronic warfarin who presents to ED with groin pain and headache. CT head no evidence of acute infarction, mass lesion, or intra- or extra-axial hemorrhage. Patient has been started on multiple antiinfectives for r/o meningitis and lumbar puncture is planned, so warfarin has been placed on hold and Pharmacy is consulted to dose IV heparin if INR is < 2.0  Home dose reported as 2.5mg  daily - last dose taken 10/23  Hgb 12.7, Plts 132  INR = 4.38  Goal of Therapy:  Heparin level 0.3-0.7 units/ml Monitor platelets by anticoagulation protocol: Yes   Plan:   No heparin needed  Continue to check daily PT/INR  Pharmacy will f/u clinical plan and begin heparin when INR < 2.0  Peggyann Juba, PharmD, BCPS Pager: [phone] 12/23/2014,7:50 AM

## 2014-12-23 NOTE — ED Notes (Signed)
Patient returned from CT, repositioned for comfort in bed. Wife remains at bedside. Updated on plan of care.

## 2014-12-23 NOTE — ED Notes (Signed)
Unable to collect labs patient is not in room 

## 2014-12-23 NOTE — Progress Notes (Signed)
ANTIBIOTIC CONSULT NOTE - FOLLOW UP  Pharmacy Consult for Vancomycin, Ceftriaxone Indication: rule out sepsis  Allergies  Allergen Reactions  . Demerol [Meperidine] Other (See Comments)    "hallucinations"    Patient Measurements: Height: 5\' 9"  (175.3 cm) Weight: 209 lb (94.802 kg) IBW/kg (Calculated) : 70.7   Vital Signs: Temp: 101.9 F (38.8 C) (10/25 1500) Temp Source: Rectal (10/25 1500) BP: 89/49 mmHg (10/25 1500) Pulse Rate: 63 (10/25 1500) Intake/Output from previous day:   Intake/Output from this shift: Total I/O In: -  Out: 500 [Urine:500]  Labs:  Recent Labs  12/22/14 2345 12/23/14 0648 12/23/14 1156  WBC 9.6 10.4  --   HGB 13.5 12.7*  --   PLT 170 132*  --   CREATININE 1.58* 1.73* 1.85*   Estimated Creatinine Clearance: 32.6 mL/min (by C-G formula based on Cr of 1.85). No results for input(s): VANCOTROUGH, VANCOPEAK, VANCORANDOM, GENTTROUGH, GENTPEAK, GENTRANDOM, TOBRATROUGH, TOBRAPEAK, TOBRARND, AMIKACINPEAK, AMIKACINTROU, AMIKACIN in the last 72 hours.   Microbiology: Recent Results (from the past 720 hour(s))  Culture, blood (routine x 2)     Status: None (Preliminary result)   Collection Time: 12/22/14 11:50 PM  Result Value Ref Range Status   Specimen Description BLOOD LEFT WRIST  Final   Special Requests BOTTLES DRAWN AEROBIC AND ANAEROBIC 5ML  Final   Culture PENDING  Incomplete   Report Status PENDING  Incomplete  MRSA PCR Screening     Status: None   Collection Time: 12/23/14 10:37 AM  Result Value Ref Range Status   MRSA by PCR NEGATIVE NEGATIVE Final    Comment:        The GeneXpert MRSA Assay (FDA approved for NASAL specimens only), is one component of a comprehensive MRSA colonization surveillance program. It is not intended to diagnose MRSA infection nor to guide or monitor treatment for MRSA infections.     Assessment: [name] presents with severe left groin pain, headache, fever. Initially started on antimicrobial  regimen for rule out meningitis.  CCM now following and reports no no meningeal signs on exam or concern for meningitis /encephalitis.  Patient with SIRS with no clear source of infection.  Pharmacy consulted for empiric vancomycin and ceftriaxone for r/o sepsis of unknown etiology.  Goal of Therapy:  Vancomycin trough level 15-20 mcg/ml Doses adjusted per renal function Eradication of infection  Plan:  - Continue Vancomycin 1250 mg IV q24h.  - Adjust Ceftriaxone to 2g IV q24h since ruled out meningitis. - F/u culture results, SCr, trough levels as indicated, clinical course  [name] 12/23/2014,4:12 PM

## 2014-12-23 NOTE — Progress Notes (Addendum)
Patient seen and examined, frail but aaox3,patient reports feeling fine, reported onset of fever a day ago, with associated headache and bilateral hip pain, denies neck pain or light sensitivity, denies recent bug bite (does report tick bite 8months ago), no diarrhea, so far preliminary result not able to identify source of infection, cxr possible pulmonary edema, he was treated with broad spectrum abx, started in the ED, however, repeat bmp showed worsening of cr, blood pressure continue to trend down, lactic acid slightly better 2.3 ( down from 2.7). He does has bilateral lower extremity pitting edema, echocardiogram pending, bilateral hip pain possible osteoarthritis,  (hip x ray no acute findings), scrotal ultrasound no acute findings. Due to blood pressure trending down, not able to tolerate fluids resuscitation, sepsis of unknown source, blood pressure trending down, ICU consulted.

## 2014-12-23 NOTE — H&P (Signed)
[facility] History and Physical  [name] ZSW:[id number] DOB: 05/16/1925 DOA: 12/22/2014  Referring physician: Dr.Glick. PCP: Wenda Low, MD  Specialists: Dr. Rayann Heman. Cardiologist.  Chief Complaint: Groin pain and headache with fever.  HPI: [name] is a 81 y.o. male with history of CAD status post CABG, bioprosthetic aortic valve, A. fib, symptomatic bradycardia status post pacemaker, chronic kidney disease, diabetes mellitus type 2 was brought to the ER the patient was having worsening groin pain and headaches. As per patient's wife who provided the history patient has had left groin pains since afternoon yesterday and started developing headaches. Patient started having rigors with chills. Since patient's symptoms persisted patient came to the ER late night. In the ER patient was found to have a fever of 104F and CT abdomen and pelvis done in the ER did not show anything acute. Scrotal ultrasound also was unremarkable. UA and chest x-ray was showing nothing acute. Patient's blood cultures were obtained and initially was given fluid boluses but as patient became fluid overloaded had to be given Lasix. On exam patient is medically confused after receiving morphine. Patient's headache mostly his frontal. Patient also had some nausea denies abdominal pain or diarrhea as per the patient's wife. Patient did not have any chest pain or shortness of breath or any productive cough. Denies any recent travel or sick contacts.   Review of Systems: As presented in the history of presenting illness, rest negative.  Past Medical History  Diagnosis Date  . PVD (peripheral vascular disease) ([name])   . Permanent atrial fibrillation ([place])   . DVT (deep venous thrombosis) ([place])   . Coronary artery disease   . Aortic stenosis   . GERD (gastroesophageal reflux disease)   . BPH (benign prostatic hypertrophy)   . Kidney stones   . Diverticulosis   . Shortness of breath   . Diabetes  mellitus without complication ([place])     type 2  . 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 Gout   . Dermatophytosis of nail   . Gastroesophageal reflux disease    Past Surgical History  Procedure Laterality Date  . Coronary artery bypass graft    . Aortic valve replacement    . Paraesophageal hernia repair    . Hernia repair    . Pacemaker insertion  10-04-2012    Medtronic pacemaker implanted by Dr Rayann Heman for symptomatic bradycardia  . Esophagogastroduodenoscopy N/A 12/16/2013    Procedure: ESOPHAGOGASTRODUODENOSCOPY (EGD);  Surgeon: Garlan Fair, MD;  Location: [facility];  Service: Endoscopy;  Laterality: N/A;  . Balloon dilation N/A 12/16/2013    Procedure: BALLOON DILATION;  Surgeon: Garlan Fair, MD;  Location: [facility] ENDOSCOPY;  Service: Endoscopy;  Laterality: N/A;  . Permanent pacemaker insertion N/A 10/04/2012    Procedure: PERMANENT PACEMAKER INSERTION;  Surgeon: Thompson Grayer, MD;  Location: [facility] CATH LAB;  Service: Cardiovascular;  Laterality: N/A;   Social History:  reports that he has never smoked. He has never used smokeless tobacco. He reports that he does not drink alcohol or use illicit drugs. Where does patient live home. Can patient participate in ADLs? Yes.  Allergies  Allergen Reactions  . Demerol [Meperidine] Other (See Comments)    "hallucinations"    Family History:  Family History  Problem Relation Age of Onset  . Cancer - Other Father       Prior to Admission medications  Medication Sig Start Date End Date Taking? Authorizing Provider  Coenzyme Q10 (CO Q 10 PO) Take 1 capsule by mouth daily.   Yes Historical Provider, MD  furosemide (LASIX) 20 MG tablet Take 20 mg by mouth every other day.   Yes Historical Provider, MD  glimepiride (AMARYL) 1 MG tablet Take 1 mg by mouth 2 (two) times daily.    Yes Historical Provider, MD  nitroGLYCERIN (NITROSTAT) 0.4 MG SL  tablet Place 1 tablet (0.4 mg total) under the tongue every 5 (five) minutes x 3 doses as needed for chest pain. 10/05/12  Yes Jettie Booze, MD  ramipril (ALTACE) 2.5 MG capsule Take 1 capsule (2.5 mg total) by mouth daily. Patient taking differently: Take 2.5 mg by mouth every other day.  02/25/14  Yes Jettie Booze, MD  warfarin (COUMADIN) 5 MG tablet Take as directed by coumadin clinic Patient taking differently: Take 2.5 mg by mouth daily at 6 PM. Take as directed by coumadin clinic 09/24/14  Yes Jettie Booze, MD    Physical Exam: Filed Vitals:   12/23/14 0352 12/23/14 0423 12/23/14 0430 12/23/14 0530  BP:  157/93 110/49 120/51  Pulse:  66 62 64  Temp: 104.5 F (40.3 C)     TempSrc: Rectal     Resp:  40 38 38  Height:      Weight:      SpO2:  92% 93% 96%     General:  Moderately built and nourished.  Eyes: Anicteric no pallor.  ENT: No discharge from the ears eyes nose or mouth.  Neck: No mass felt. JVD not elevated. Mild neck rigidity.  Cardiovascular: S1-S2 heard.  Respiratory: No rhonchi or crepitations.  Abdomen: Soft nontender bowel sounds present.  Skin: Chronic skin changes in the lower extremity.  Musculoskeletal: Mild edema.  Psychiatric: Patient is mildly confused.  Neurologic: Patient is mildly confused as patient just received morphine. Moves all extremities.  Labs on Admission:  Basic Metabolic Panel:  Recent Labs Lab 12/22/14 2345  NA 140  K 3.9  CL 106  CO2 27  GLUCOSE 182*  BUN 27*  CREATININE 1.58*  CALCIUM 8.9   Liver Function Tests:  Recent Labs Lab 12/22/14 2345  AST 23  ALT 13*  ALKPHOS 42  BILITOT 1.2  PROT 7.2  ALBUMIN 3.9   No results for input(s): LIPASE, AMYLASE in the last 168 hours. No results for input(s): AMMONIA in the last 168 hours. CBC:  Recent Labs Lab 12/22/14 2345  WBC 9.6  NEUTROABS 8.5*  HGB 13.5  HCT 42.5  MCV 98.6  PLT 170   Cardiac Enzymes: No results for input(s):  CKTOTAL, CKMB, CKMBINDEX, TROPONINI in the last 168 hours.  BNP (last 3 results)  Recent Labs  12/23/14 0336  BNP 306.2*    ProBNP (last 3 results) No results for input(s): PROBNP in the last 8760 hours.  CBG: No results for input(s): GLUCAP in the last 168 hours.  Radiological Exams on Admission: Ct Abdomen Pelvis Wo Contrast  12/23/2014  CLINICAL DATA:  Acute onset of left inguinal pain. Initial encounter. EXAM: CT ABDOMEN AND PELVIS WITHOUT CONTRAST TECHNIQUE: Multidetector CT imaging of the abdomen and pelvis was performed following the standard protocol without IV contrast. COMPARISON:  MRI of the lumbar spine performed 11/24/2006 FINDINGS: Left basilar atelectasis is noted. Scattered coronary artery calcifications are seen. Postoperative change is noted at the aortic valve. The patient is status post median sternotomy. A pacemaker lead is partially imaged. There  is marked elevation of the left hemidiaphragm, with displacement of the stomach, head and proximal body of the pancreas, small and large bowel loops and spleen superiorly. Surrounding postoperative change is noted. There is rightward displacement of the heart and distal descending thoracic aorta. The liver and spleen are unremarkable in appearance. The gallbladder is within normal limits. The pancreas and adrenal glands are unremarkable. A 1.2 cm cyst is noted at the interpole region of the right kidney. Nonspecific perinephric stranding is noted bilaterally, more prominent on the right. There is no evidence of hydronephrosis. A 3 mm stone is noted at the interpole region of the left kidney. No obstructing ureteral stones are seen. No free fluid is identified. The small bowel is unremarkable in appearance. The stomach is within normal limits. No acute vascular abnormalities are seen. Scattered calcification is noted along the abdominal aorta and its branches. The appendix is normal in caliber, without evidence of appendicitis. The  colon is grossly unremarkable in appearance. The bladder is mildly distended and grossly unremarkable. The prostate remains normal in size. No inguinal lymphadenopathy is seen. The left inguinal region is unremarkable in appearance. No acute osseous abnormalities are identified. IMPRESSION: 1. Left inguinal region is unremarkable in appearance. 2. Marked elevation of the left hemidiaphragm, with displacement of the stomach, head and proximal body of the pancreas, small and large bowel loops and spleen superiorly. Surrounding postoperative change noted. Rightward displacement of the heart and distal descending thoracic aorta. 3. Small right renal cyst noted. 3 mm nonobstructing stone at the interpole region of the left kidney. 4. Scattered calcification along the abdominal aorta and its branches. 5. Left basilar atelectasis noted. 6. Scattered coronary artery calcifications seen. Electronically Signed   By: Garald Balding M.D.   On: 12/23/2014 01:33   Ct Head Wo Contrast  12/23/2014  CLINICAL DATA:  Acute onset of headache and fever. Altered mental status. Initial encounter. EXAM: CT HEAD WITHOUT CONTRAST TECHNIQUE: Contiguous axial images were obtained from the base of the skull through the vertex without intravenous contrast. COMPARISON:  None. FINDINGS: There is no evidence of acute infarction, mass lesion, or intra- or extra-axial hemorrhage on CT. Prominence of the sulci suggests mild cortical volume loss. Scattered periventricular and subcortical white matter change likely reflects small vessel ischemic microangiopathy. Cerebellar atrophy is noted. The brainstem and fourth ventricle are within normal limits. The basal ganglia are unremarkable in appearance. The cerebral hemispheres demonstrate grossly normal gray-white differentiation. No mass effect or midline shift is seen. There is no evidence of fracture; visualized osseous structures are unremarkable in appearance. The orbits are within normal limits.  There is partial opacification of the right side of the sphenoid sinus. The remaining paranasal sinuses and mastoid air cells are well-aerated. No significant soft tissue abnormalities are seen. IMPRESSION: 1. No acute intracranial pathology seen on CT. 2. Mild cortical volume loss and scattered small vessel ischemic microangiopathy. 3. Partial opacification of the right side of the sphenoid sinus. Electronically Signed   By: Garald Balding M.D.   On: 12/23/2014 05:58   US Scrotum  12/23/2014  CLINICAL DATA:  Acute onset of groin pain.  Initial encounter. EXAM: SCROTAL ULTRASOUND DOPPLER ULTRASOUND OF THE TESTICLES TECHNIQUE: Complete ultrasound examination of the testicles, epididymis, and other scrotal structures was performed. Color and spectral Doppler ultrasound were also utilized to evaluate blood flow to the testicles. COMPARISON:  None. FINDINGS: Right testicle Measurements: 3.8 x 2.1 x 2.8 cm. No mass or microlithiasis visualized. Left testicle Measurements: 4.2  x 2.3 x 2.6 cm. No mass or microlithiasis visualized. Right epididymis: A septated 1.4 x 0.9 x 0.9 cm cyst is noted at the body of the right epididymis. The right epididymis is otherwise unremarkable. Left epididymis: A 1.6 x 0.8 x 1.3 cm septated cyst is noted at the left epididymal tail. The left epididymis is otherwise unremarkable. Hydrocele:  None visualized. Varicocele:  None visualized. Pulsed Doppler interrogation of both testes demonstrates normal low resistance arterial and venous waveforms bilaterally. IMPRESSION: 1. No evidence of testicular torsion. 2. Septated bilateral epididymal cysts noted, measuring 1.4 cm on the right and 1.6 cm on the left. Epididymides otherwise unremarkable. Electronically Signed   By: Garald Balding M.D.   On: 12/23/2014 04:18   [place] Art/ven Flow Abd Pelv Doppler  12/23/2014  CLINICAL DATA:  Acute onset of groin pain.  Initial encounter. EXAM: SCROTAL ULTRASOUND DOPPLER ULTRASOUND OF THE TESTICLES  TECHNIQUE: Complete ultrasound examination of the testicles, epididymis, and other scrotal structures was performed. Color and spectral Doppler ultrasound were also utilized to evaluate blood flow to the testicles. COMPARISON:  None. FINDINGS: Right testicle Measurements: 3.8 x 2.1 x 2.8 cm. No mass or microlithiasis visualized. Left testicle Measurements: 4.2 x 2.3 x 2.6 cm. No mass or microlithiasis visualized. Right epididymis: A septated 1.4 x 0.9 x 0.9 cm cyst is noted at the body of the right epididymis. The right epididymis is otherwise unremarkable. Left epididymis: A 1.6 x 0.8 x 1.3 cm septated cyst is noted at the left epididymal tail. The left epididymis is otherwise unremarkable. Hydrocele:  None visualized. Varicocele:  None visualized. Pulsed Doppler interrogation of both testes demonstrates normal low resistance arterial and venous waveforms bilaterally. IMPRESSION: 1. No evidence of testicular torsion. 2. Septated bilateral epididymal cysts noted, measuring 1.4 cm on the right and 1.6 cm on the left. Epididymides otherwise unremarkable. Electronically Signed   By: Garald Balding M.D.   On: 12/23/2014 04:18   Dg Chest Port 1 View  12/23/2014  CLINICAL DATA:  Acute onset of worsening shortness of breath. Initial encounter. EXAM: PORTABLE CHEST 1 VIEW COMPARISON:  Chest radiograph performed 12/22/2014 FINDINGS: Increased vascular congestion is noted, with bilateral central airspace opacities and a moderate left-sided pleural effusion. Findings are compatible with worsening pulmonary edema. No pneumothorax is seen. The cardiomediastinal silhouette is enlarged. The patient is status post median sternotomy. A pacemaker is noted overlying the left chest wall, with a single lead ending overlying the right ventricle. Evaluation is suboptimal due to patient rotation. No acute osseous abnormalities are identified. IMPRESSION: Increased vascular congestion and cardiomegaly, with bilateral central airspace  opacities and a moderate left-sided pleural effusion. Findings are compatible with worsening pulmonary edema. Electronically Signed   By: Garald Balding M.D.   On: 12/23/2014 03:46   Dg Chest Port 1 View  12/22/2014  CLINICAL DATA:  81 year old male with fever at 5 p.m. today. Currently being treated for kidney stones. EXAM: PORTABLE CHEST 1 VIEW COMPARISON:  Chest x-ray 06/2013 FINDINGS: Elevation of left hemidiaphragm. Opacities at the left lung base may reflect atelectasis and/or scarring, and are similar to the prior examinations. Possible small chronic left pleural effusion. Right lung appears clear. Mild diffuse coarsening of interstitial markings appears to be chronic. Mild cephalization of the pulmonary vasculature. Heart size is mildly enlarged. The patient is rotated to the left on today's exam, resulting in distortion of the mediastinal contours and reduced diagnostic sensitivity and specificity for mediastinal pathology. Atherosclerosis in the thoracic aorta. Status post median  sternotomy for CABG and aortic valve replacement (a stented bioprosthesis is noted). Left-sided pacemaker device in position with lead tip projecting over the expected location of the right ventricular apex. IMPRESSION: 1. The appearance the chest again suggests mild congestive heart failure, as above. 2. Chronic elevation of left hemidiaphragm is unchanged and again associated with extensive areas of chronic scarring or subsegmental atelectasis in the left lower lobe. Probable small chronic left pleural effusion also noted. 3. Atherosclerosis. 4. Postoperative changes and support apparatus, as above. Electronically Signed   By: Vinnie Langton M.D.   On: 12/22/2014 23:50     Assessment/Plan Principal Problem:   SIRS (systemic inflammatory response syndrome) (HCC) Active Problems:   Essential hypertension, benign   Cardiac pacemaker in situ   Fever, unspecified   Type 2 diabetes mellitus with vascular disease  (HCC)   CKD (chronic kidney disease) stage 3, GFR 30-59 ml/min   1. SIRS - patient has a fever of 104F so some of which is not clear. Patient does complain of headache and also groin pain. Since CT abdomen does not show anything acute at this time I have placed patient on vancomycin, Rocephin, ampicillin and acyclovir to cover for meningitis/encephalitis. CT head is pending and if negative for any bleed and if INR is less than 1.5 (INR is pending) patient will need lumbar puncture. Closely observe and stepdown unit. Check influenza PCR. 2. Diabetes mellitus type 2 - patient is on Amaryl which will be continued with sliding scale coverage. 3. History of chronic atrial fibrillation - chads 2 vasc score is more than 2. Patient's Coumadin is on hold and will be placed on heparin if INR is less than 2 and will need to be stopped if lumbar puncture plan. 4. Hypertension - on ACE inhibitor. 5. Chronic kidney disease stage III - creatinine appears to be at baseline. 6. CHF with history of CAD status post CABG - last EF measured in 2014 was 60-65%. Holding off further fluid as patient became mildly fluid overloaded in the ER.  I have reviewed patient's old charts and labs. Personally reviewed patient's chest x-ray. CT head is pending.   DVT Prophylaxis heparin.  Code Status: Full code.  Family Communication: Patient's wife.  Disposition Plan: Admit to inpatient.    Malay Fantroy N. [facility] Pager [phone].  If 7PM-7AM, please contact night-coverage [email or web address] Password [email or web address] 12/23/2014, 6:10 AM

## 2014-12-23 NOTE — Progress Notes (Signed)
ANTIBIOTIC CONSULT NOTE - INITIAL  Pharmacy Consult for Acyclovir/Vancomycin/Rocephin Indication: Meningitis  Allergies  Allergen Reactions  . Demerol [Meperidine] Other (See Comments)    "hallucinations"    Patient Measurements: Height: 5\' 9"  (175.3 cm) Weight: 209 lb (94.802 kg) IBW/kg (Calculated) : 70.7   Vital Signs: Temp: 104.8 F (40.4 C) (10/25 0625) Temp Source: Rectal (10/25 0625) BP: 111/49 mmHg (10/25 0600) Pulse Rate: 65 (10/25 0600) Intake/Output from previous day:   Intake/Output from this shift:    Labs:  Recent Labs  12/22/14 2345  WBC 9.6  HGB 13.5  PLT 170  CREATININE 1.58*   Estimated Creatinine Clearance: 38.1 mL/min (by C-G formula based on Cr of 1.58). No results for input(s): VANCOTROUGH, VANCOPEAK, VANCORANDOM, GENTTROUGH, GENTPEAK, GENTRANDOM, TOBRATROUGH, TOBRAPEAK, TOBRARND, AMIKACINPEAK, AMIKACINTROU, AMIKACIN in the last 72 hours.   Microbiology: No results found for this or any previous visit (from the past 720 hour(s)).  Medical History: Past Medical History  Diagnosis Date  . PVD (peripheral vascular disease) ([place])   . Permanent atrial fibrillation ([place])   . DVT (deep venous thrombosis) ([place])   . Coronary artery disease   . Aortic stenosis   . GERD (gastroesophageal reflux disease)   . BPH (benign prostatic hypertrophy)   . Kidney stones   . Diverticulosis   . Shortness of breath   . Diabetes mellitus without complication ([place])     type 2  . 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 Gout   . Dermatophytosis of nail   . Gastroesophageal reflux disease     Medications:   (Not in a hospital admission) Scheduled:  . glimepiride  1 mg Oral BID  . insulin aspart  0-9 Units Subcutaneous TID WC  . ramipril  2.5 mg Oral QODAY  . sodium chloride  3 mL Intravenous Q12H   Infusions:  . acyclovir    . ampicillin (OMNIPEN) IV    .  cefTRIAXone (ROCEPHIN)  IV    . [START ON 12/24/2014] vancomycin     Assessment: 28 yoM with hx of A-fib on chronic warfarin c/o sudden onset of severe left groin pan.  Acyclovir/Vancomycin/Rocephin per Rx for Meningitis.   Goal of Therapy:  Vancomycin trough level 15-20 mcg/ml  Plan:   Acyclovir 705 mg IV q12h  Vancomycin 1Gm x1 then 1250mg  IV q24h  Rocephin 2gm IV q12h  F/u SCr/cultures/levels as needed  Lawana Pai R 12/23/2014,6:41 AM

## 2014-12-23 NOTE — Progress Notes (Signed)
  Echocardiogram 2D Echocardiogram with Definity has been performed.  Jennette Dubin 12/23/2014, 4:34 PM

## 2014-12-23 NOTE — Progress Notes (Signed)
CRITICAL VALUE ALERT  Critical value received:  GRAM POSITIVE COCCI IN CLUSTERS  IN BOTH AEROBIC AND ANAEROBIC BOTTLES  Date of notification:  12/23/14  Time of notification:  2030  Critical value read back:Yes.    Nurse who received alert:  S.Zaylyn Bergdoll,RN  MD notified (1st page):  K.Schorr,NP  Time of first page:  2108  MD notified (2nd page):  Time of second page:  Responding MD:    Time MD responded:

## 2014-12-23 NOTE — ED Notes (Signed)
XR at bedside

## 2014-12-23 NOTE — ED Notes (Signed)
Spoke to Dr Hal Hope Re: Patient temp and bladder scan  Orders received to give 650mg  tylenol suppository and to place foley catheter. Will con't to monitor.

## 2014-12-23 NOTE — Consult Note (Signed)
PULMONARY / CRITICAL CARE MEDICINE   Name: [name] MRN: [id number] DOB: 1926-01-13    ADMISSION DATE:  12/22/2014 CONSULTATION DATE:  12/23/14  REFERRING MD :  Dr. Erlinda Hong / [facility]   CHIEF COMPLAINT:  Groin pain, headache, fever  INITIAL PRESENTATION: 81 y/o M who presented to [facility] on 10/25 with L>R inguinal pain, chills, fever and headache.  Work up concerning for SIRS without clear source of infection.  PCCM consulted for evaluation.   STUDIES:  10/25 CT ABD/Pelvis >> left inguinal region unremarkable, marked elevation of the L hemi-diaphragm with displacement of the stomach, head & proximal body of the pancreas, small / large bowel loops & spleen superiorly.  Small renal cyst, calcification of abd aorta, left basilar atx 10/25 CT Head >> no acute intracranial abnormality, mild cortical volume loss, scattered small vessel ischemic disease 10/25  Scrotal US >> negative for testicular torsion, septated bilateral epididymal cysts noted otherwise unremarkable.   SIGNIFICANT EVENTS: 10/25  Admit with fever, L groin pain, and headache.     HISTORY OF PRESENT ILLNESS:  81 y/o M with PMH of CAD status post CABG/AVR on Coumadin, symptomatically bradycardia S/P pacemaker, DVT, GERD, kidney stones, diverticulosis, paraesophageal hernia repair, BPH, chronic left foot drop (wears a brace) and diabetes mellitus who presented to [facility] on 10/24 with complaints of acute onset bilateral (left greater than right) groin pain, fever and headache.  The patient reported he woke up the day of admission (10/24) in his usual state of health. Patient reported he was seen at the Coumadin clinic for adjustment of medication. Around 5 PM he developed acute onset bilateral groin pain, left greater than right.  He reports he has had episodes similar to this in the past that he "pushes through".  He has been hospitalized one other time for a similar episode and was found to have kidney stones.  He attempted Tylenol without  relief. The patient's wife also reported shaking chills at home.  After arrival to the ER, he developed headache and fever.  Initial temperature 100.5 however he developed a Tmax of 104.8 in ER.  Initial lab workup: NA 140, K3.9, creatinine 1.58, lactic acid 1.8, W BC 10.4, hemoglobin 12.7, neutrophils 93%, INR 4.38. Influenza PCR was negative. Initial chest x-ray demonstrated evidence of mild pulmonary edema with cephalization, cardiomegaly but no overt infiltrate. A CT of the abdomen/pelvis was assessed which was negative for acute process in the abdomen/groin. Basilar atelectasis was noted. An ultrasound of the scrotum was assessed which was negative for testicular torsion, septated bilateral epididymal cysts noted otherwise unremarkable. Urinalysis was negative. Urine culture is pending. Throughout his ER course he developed headache and was treated with 4 mg of IV morphine.  Initially he was treated with hydration. However, follow-up chest x-ray 10/25 (post volume administration) demonstrated an increase in vascular congestion, moderate left-sided pleural effusion and concerns for worsening pulmonary edema.  He then was treated with IV Lasix. He also developed fever to 104. Lactic acid was repeated with a noted increased to 2.8. He also was noted to develop transient desaturations - question if this was related to/correlated with morphine administration/period of sedation??   He was empirically covered with vancomycin and Zosyn. The patient was admitted per [facility] hospitalist's for further evaluation. PCCM consulted for evaluation of SIRS.  At baseline he continues to be very active. The patient continues to drive is completely independent of all ADLs. 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  has past surgical history that includes Coronary artery bypass graft; Aortic valve replacement; Paraesophageal hernia repair; Hernia repair; Pacemaker insertion (10-04-2012); Esophagogastroduodenoscopy (N/A, 12/16/2013); Balloon dilation (N/A, 12/16/2013); and permanent pacemaker insertion (N/A, 10/04/2012).    Prior to Admission medications   Medication Sig Start Date End Date Taking? Authorizing Provider  Coenzyme Q10 (CO Q 10 PO) Take 1 capsule by mouth daily.   Yes Historical Provider, MD  furosemide (LASIX) 20 MG tablet Take 20 mg by mouth every other day.   Yes Historical Provider, MD  glimepiride (AMARYL) 1 MG tablet Take 1 mg by mouth 2 (two) times daily.    Yes Historical Provider, MD  nitroGLYCERIN (NITROSTAT) 0.4 MG SL tablet Place 1 tablet (0.4 mg total) under the tongue every 5 (five) minutes x 3 doses as needed for chest pain. 10/05/12  Yes Jettie Booze, MD  ramipril (ALTACE) 2.5 MG capsule Take 1 capsule (2.5 mg total) by mouth daily. Patient taking differently: Take 2.5 mg by mouth every other day.  02/25/14  Yes Jettie Booze, MD  warfarin (COUMADIN) 5 MG tablet Take as directed by coumadin clinic Patient taking differently: Take 2.5 mg by mouth daily at 6 PM. Take as directed by coumadin clinic 09/24/14  Yes Jettie Booze, MD   Allergies  Allergen Reactions  . Demerol [Meperidine] Other (See Comments)    "hallucinations"    FAMILY HISTORY:  indicated that his mother is deceased. He indicated that his father is deceased. He indicated that his maternal grandmother is deceased. He indicated that his maternal  grandfather is deceased. He indicated that his paternal grandmother is deceased. He indicated that his paternal grandfather is deceased.    SOCIAL HISTORY:  reports that he has never smoked. He has never used smokeless tobacco. He reports that he does not drink alcohol or use illicit drugs.  REVIEW OF SYSTEMS:   Gen: Denies weight change, fatigue, night sweats.  Reports fevers, chills.  HEENT: Denies blurred vision, double vision, hearing loss, tinnitus, sinus congestion, rhinorrhea, sore throat, neck stiffness, dysphagia PULM: Denies shortness of breath, cough, sputum production, hemoptysis, wheezing CV: Denies chest pain, orthopnea, paroxysmal nocturnal dyspnea, palpitations.  Reports chronic LE edema, unchanged.   GI: Denies abdominal pain, nausea, vomiting, diarrhea, hematochezia, melena, constipation, change in bowel habits.  Reports left >R inguinal pain GU: Denies dysuria, hematuria, polyuria, oliguria, urethral discharge Endocrine: Denies hot or cold intolerance, polyuria, polyphagia or appetite change Derm: Denies rash, dry skin, scaling or peeling skin change Heme: Denies easy bruising, bleeding, bleeding gums Neuro: Denies headache, numbness, weakness, slurred speech, loss of memory or consciousness    SUBJECTIVE:  Pt currently reports feeling better than when he came to the ER.    VITAL SIGNS: Temp:  [98.3 F (36.8 C)-104.8 F (40.4 C)] 101.9 F (38.8 C) (10/25 1500) Pulse Rate:  [57-73] 63 (10/25 1500) Resp:  [20-44] 23 (10/25 1500) BP: (83-157)/(40-99) 89/49 mmHg (10/25 1500) SpO2:  [91 %-100 %] 98 % (10/25 1500) Weight:  [209 lb (94.802 kg)] 209 lb (94.802 kg) (10/24 2200)   HEMODYNAMICS:   VENTILATOR  SETTINGS:   INTAKE / OUTPUT:  Intake/Output Summary (Last 24 hours) at 12/23/14 1503 Last data filed at 12/23/14 0707  Gross per 24 hour  Intake      0 ml  Output    500 ml  Net   -500 ml    PHYSICAL EXAMINATION: General:  wdwn elderly male in NAD  Neuro:   AAOx4, speech clear, MAE, chronic L foot drop HEENT:  MM pink/dry, no jvd  Cardiovascular:  s1s2 rrr, 2/6 SEM Lungs:  resp's even/non-labored, lungs bilaterally clear, no crackles / wheezes  Abdomen:  Obese/soft, bsx4 active  Musculoskeletal:  No acute deformities, L foot drop Skin:  Warm/dry, 2+ pitting edema in BLE   LABS:  CBC  Recent Labs Lab 12/22/14 2345 12/23/14 0648  WBC 9.6 10.4  HGB 13.5 12.7*  HCT 42.5 39.7  PLT 170 132*   Coag's  Recent Labs Lab 12/22/14 0905 12/23/14 0648  APTT  --  >200*  INR 1.9 4.38*   BMET  Recent Labs Lab 12/22/14 2345 12/23/14 0648 12/23/14 1156  NA 140 139 141  K 3.9 3.8 3.8  CL 106 108 108  CO2 27 23 25   BUN 27* 28* 29*  CREATININE 1.58* 1.73* 1.85*  GLUCOSE 182* 202* 114*   Electrolytes  Recent Labs Lab 12/22/14 2345 12/23/14 0648 12/23/14 1156  CALCIUM 8.9 8.1* 8.3*   Sepsis Markers  Recent Labs Lab 12/23/14 0346 12/23/14 0648 12/23/14 0654 12/23/14 1157  LATICACIDVEN 2.61*  --  2.7* 2.3*  PROCALCITON  --  1.70  --   --    ABG No results for input(s): PHART, PCO2ART, PO2ART in the last 168 hours.   Liver Enzymes  Recent Labs Lab 12/22/14 2345 12/23/14 0648 12/23/14 1156  AST 23 39 41  ALT 13* 19 19  ALKPHOS 42 38 37*  BILITOT 1.2 1.6* 1.3*  ALBUMIN 3.9 3.5 3.3*   Cardiac Enzymes No results for input(s): TROPONINI, PROBNP in the last 168 hours.   Glucose  Recent Labs Lab 12/23/14 0811 12/23/14 1154  GLUCAP 167* 105*    Imaging Ct Abdomen Pelvis Wo Contrast  12/23/2014  CLINICAL DATA:  Acute onset of left inguinal pain. Initial encounter. EXAM: CT ABDOMEN AND PELVIS WITHOUT CONTRAST TECHNIQUE: Multidetector CT imaging of the abdomen and pelvis was performed following the standard protocol without IV contrast. COMPARISON:  MRI of the lumbar spine performed 11/24/2006 FINDINGS: Left basilar atelectasis is noted. Scattered coronary artery calcifications are seen. Postoperative change is  noted at the aortic valve. The patient is status post median sternotomy. A pacemaker lead is partially imaged. There is marked elevation of the left hemidiaphragm, with displacement of the stomach, head and proximal body of the pancreas, small and large bowel loops and spleen superiorly. Surrounding postoperative change is noted. There is rightward displacement of the heart and distal descending thoracic aorta. The liver and spleen are unremarkable in appearance. The gallbladder is within normal limits. The pancreas and adrenal glands are unremarkable. A 1.2 cm cyst is noted at the interpole region of the right kidney. Nonspecific perinephric stranding is noted bilaterally, more prominent on the right. There is no evidence of hydronephrosis. A 3 mm stone is noted at the interpole region of the left kidney. No obstructing ureteral stones are seen. No free fluid is identified. The small bowel is unremarkable in appearance. The stomach is within normal limits. No acute vascular abnormalities are seen. Scattered calcification is noted along the abdominal aorta and its branches. The appendix  is normal in caliber, without evidence of appendicitis. The colon is grossly unremarkable in appearance. The bladder is mildly distended and grossly unremarkable. The prostate remains normal in size. No inguinal lymphadenopathy is seen. The left inguinal region is unremarkable in appearance. No acute osseous abnormalities are identified. IMPRESSION: 1. Left inguinal region is unremarkable in appearance. 2. Marked elevation of the left hemidiaphragm, with displacement of the stomach, head and proximal body of the pancreas, small and large bowel loops and spleen superiorly. Surrounding postoperative change noted. Rightward displacement of the heart and distal descending thoracic aorta. 3. Small right renal cyst noted. 3 mm nonobstructing stone at the interpole region of the left kidney. 4. Scattered calcification along the abdominal  aorta and its branches. 5. Left basilar atelectasis noted. 6. Scattered coronary artery calcifications seen. Electronically Signed   By: Garald Balding M.D.   On: 12/23/2014 01:33   Ct Head Wo Contrast  12/23/2014  CLINICAL DATA:  Acute onset of headache and fever. Altered mental status. Initial encounter. EXAM: CT HEAD WITHOUT CONTRAST TECHNIQUE: Contiguous axial images were obtained from the base of the skull through the vertex without intravenous contrast. COMPARISON:  None. FINDINGS: There is no evidence of acute infarction, mass lesion, or intra- or extra-axial hemorrhage on CT. Prominence of the sulci suggests mild cortical volume loss. Scattered periventricular and subcortical white matter change likely reflects small vessel ischemic microangiopathy. Cerebellar atrophy is noted. The brainstem and fourth ventricle are within normal limits. The basal ganglia are unremarkable in appearance. The cerebral hemispheres demonstrate grossly normal gray-white differentiation. No mass effect or midline shift is seen. There is no evidence of fracture; visualized osseous structures are unremarkable in appearance. The orbits are within normal limits. There is partial opacification of the right side of the sphenoid sinus. The remaining paranasal sinuses and mastoid air cells are well-aerated. No significant soft tissue abnormalities are seen. IMPRESSION: 1. No acute intracranial pathology seen on CT. 2. Mild cortical volume loss and scattered small vessel ischemic microangiopathy. 3. Partial opacification of the right side of the sphenoid sinus. Electronically Signed   By: Garald Balding M.D.   On: 12/23/2014 05:58   US Scrotum  12/23/2014  CLINICAL DATA:  Acute onset of groin pain.  Initial encounter. EXAM: SCROTAL ULTRASOUND DOPPLER ULTRASOUND OF THE TESTICLES TECHNIQUE: Complete ultrasound examination of the testicles, epididymis, and other scrotal structures was performed. Color and spectral Doppler ultrasound  were also utilized to evaluate blood flow to the testicles. COMPARISON:  None. FINDINGS: Right testicle Measurements: 3.8 x 2.1 x 2.8 cm. No mass or microlithiasis visualized. Left testicle Measurements: 4.2 x 2.3 x 2.6 cm. No mass or microlithiasis visualized. Right epididymis: A septated 1.4 x 0.9 x 0.9 cm cyst is noted at the body of the right epididymis. The right epididymis is otherwise unremarkable. Left epididymis: A 1.6 x 0.8 x 1.3 cm septated cyst is noted at the left epididymal tail. The left epididymis is otherwise unremarkable. Hydrocele:  None visualized. Varicocele:  None visualized. Pulsed Doppler interrogation of both testes demonstrates normal low resistance arterial and venous waveforms bilaterally. IMPRESSION: 1. No evidence of testicular torsion. 2. Septated bilateral epididymal cysts noted, measuring 1.4 cm on the right and 1.6 cm on the left. Epididymides otherwise unremarkable. Electronically Signed   By: Garald Balding M.D.   On: 12/23/2014 04:18   [place] Art/ven Flow Abd Pelv Doppler  12/23/2014  CLINICAL DATA:  Acute onset of groin pain.  Initial encounter. EXAM: SCROTAL ULTRASOUND DOPPLER ULTRASOUND OF  THE TESTICLES TECHNIQUE: Complete ultrasound examination of the testicles, epididymis, and other scrotal structures was performed. Color and spectral Doppler ultrasound were also utilized to evaluate blood flow to the testicles. COMPARISON:  None. FINDINGS: Right testicle Measurements: 3.8 x 2.1 x 2.8 cm. No mass or microlithiasis visualized. Left testicle Measurements: 4.2 x 2.3 x 2.6 cm. No mass or microlithiasis visualized. Right epididymis: A septated 1.4 x 0.9 x 0.9 cm cyst is noted at the body of the right epididymis. The right epididymis is otherwise unremarkable. Left epididymis: A 1.6 x 0.8 x 1.3 cm septated cyst is noted at the left epididymal tail. The left epididymis is otherwise unremarkable. Hydrocele:  None visualized. Varicocele:  None visualized. Pulsed Doppler interrogation  of both testes demonstrates normal low resistance arterial and venous waveforms bilaterally. IMPRESSION: 1. No evidence of testicular torsion. 2. Septated bilateral epididymal cysts noted, measuring 1.4 cm on the right and 1.6 cm on the left. Epididymides otherwise unremarkable. Electronically Signed   By: Garald Balding M.D.   On: 12/23/2014 04:18   Dg Chest Port 1 View  12/23/2014  CLINICAL DATA:  Acute onset of worsening shortness of breath. Initial encounter. EXAM: PORTABLE CHEST 1 VIEW COMPARISON:  Chest radiograph performed 12/22/2014 FINDINGS: Increased vascular congestion is noted, with bilateral central airspace opacities and a moderate left-sided pleural effusion. Findings are compatible with worsening pulmonary edema. No pneumothorax is seen. The cardiomediastinal silhouette is enlarged. The patient is status post median sternotomy. A pacemaker is noted overlying the left chest wall, with a single lead ending overlying the right ventricle. Evaluation is suboptimal due to patient rotation. No acute osseous abnormalities are identified. IMPRESSION: Increased vascular congestion and cardiomegaly, with bilateral central airspace opacities and a moderate left-sided pleural effusion. Findings are compatible with worsening pulmonary edema. Electronically Signed   By: Garald Balding M.D.   On: 12/23/2014 03:46   Dg Chest Port 1 View  12/22/2014  CLINICAL DATA:  81 year old male with fever at 5 p.m. today. Currently being treated for kidney stones. EXAM: PORTABLE CHEST 1 VIEW COMPARISON:  Chest x-ray 06/2013 FINDINGS: Elevation of left hemidiaphragm. Opacities at the left lung base may reflect atelectasis and/or scarring, and are similar to the prior examinations. Possible small chronic left pleural effusion. Right lung appears clear. Mild diffuse coarsening of interstitial markings appears to be chronic. Mild cephalization of the pulmonary vasculature. Heart size is mildly enlarged. The patient is rotated  to the left on today's exam, resulting in distortion of the mediastinal contours and reduced diagnostic sensitivity and specificity for mediastinal pathology. Atherosclerosis in the thoracic aorta. Status post median sternotomy for CABG and aortic valve replacement (a stented bioprosthesis is noted). Left-sided pacemaker device in position with lead tip projecting over the expected location of the right ventricular apex. IMPRESSION: 1. The appearance the chest again suggests mild congestive heart failure, as above. 2. Chronic elevation of left hemidiaphragm is unchanged and again associated with extensive areas of chronic scarring or subsegmental atelectasis in the left lower lobe. Probable small chronic left pleural effusion also noted. 3. Atherosclerosis. 4. Postoperative changes and support apparatus, as above. Electronically Signed   By: Vinnie Langton M.D.   On: 12/22/2014 23:50   Dg Hips Bilat With Pelvis 2v  12/23/2014  CLINICAL DATA:  Bilateral hip pain, more severe on the left than on the right. No history of recent trauma EXAM: DG HIP (WITH OR WITHOUT PELVIS) 2V EACH HIP COMPARISON:  None. FINDINGS: Frontal pelvis as well as frontal and  lateral views of each hip were obtained, total five views. There is no demonstrable fracture or dislocation. The joint spaces appear symmetric and within normal limits. No erosive change. There is soft tissue calcification lateral to the each proximal femur at the respective intertrochanteric levels. There is degenerative change in the lower lumbar spine. IMPRESSION: No fracture or dislocation. No appreciable hip joint space arthropathy. Degenerative change is noted in the lower lumbar spine. Electronically Signed   By: Lowella Grip III M.D.   On: 12/23/2014 11:51     ASSESSMENT / PLAN:  PULMONARY OETT A: Pulmonary Edema -  PAH - PAS 63 on ECHO 09/2012 P:   Oxygen as needed to support sats > 92%  CARDIOVASCULAR CVL A:  Hypotension / SIRS - unclear  etiology  CAD s/p CABG  AS s/p AVR  Permanent AF on Coumadin  PVD P:  SDU monitoring  INR supratherapeutic, hold coumadin for now > likely can be restarted once INR wnl unless procedures needed Gentle hydration, NS @ 18ml/hr No indication for central line at this time Assess cortisol, TSH  Hold home ACE-I, lasix   RENAL A:   Lactic Acidosis - resolving  Acute on Chronic Kidney Injury - baseline sr cr ~ 1.3  P:   Hold further lasix NS as above  Trend BMP / UOP  Replace electrolytes as indicated   GASTROINTESTINAL A:   Constipation - large stool burden noted in rectum on CT  P:   PRN dulcolax  Diet as tolerated  Assess LFT's   HEMATOLOGIC A:   Coagulopathy on Chronic Coumadin Hx DVT  P:  Follow CBC Daily INR See cardiology section   INFECTIOUS A:   Fever - no meningeal signs on exam or concern for meningitis /encephalitis  SIRS - no clear source infection P:   BCx2 10/25 >>  UC 10/25 >>  Flu PCR 10/25 >> neg   Rocephin, start date 10/25, day 1/x Vanco, start date 10/25, day 1/x   Trend PCT  Follow cultures  ENDOCRINE A:   DM II    P:   Management per primary SVC  HOLD home amaryl SSI  Assess TSH  NEUROLOGIC A:   Pain - inguinal, unclear etiology  Headache P:   RASS goal: n/a PRN percocet for pain No indication for LP at this time.    FAMILY  - Updates: Wife and patient updated on patients status.    - Inter-disciplinary family meet or Palliative Care meeting due by:   10/31  Monitor on SDU, PCCM will follow.   Noe Gens, NP-C [facility] Pgr: [phone] or if no answer [phone] 12/23/2014, 3:04 PM   Sepsis - Repeat Assessment  Performed at:    315 pm 12/23/14  Vitals     Blood pressure 92/46, pulse 59, temperature 101.9 F (38.8 C), temperature source Rectal, resp. rate 24, height 5\' 9"  (1.753 m), weight 94.802 kg (209 lb), SpO2 95 %.  Heart:     Regular rate and rhythm  Lungs:    CTA  Capillary  Refill:   <2 sec  Peripheral Pulse:   Radial pulse palpable  Skin:     Normal Color     STAFF NOTE: I, Merrie Roof, MD FACP have personally reviewed patient's available data, including medical history, events of note, physical examination and test results as part of my evaluation. I have discussed with resident/NP and other care providers such as pharmacist, RN and RRT. In addition, I personally  evaluated patient and elicited key findings of: awake, alert, MAP 67, no crackles, normal testicular examination, abdomen neg, UA neg, murmur present, source sepsis is unclear, r/o endocarditis prosthetic valve, r/o viral PNA process, he has NO sig jvd, pa pressures in past 65 and bases lungs may have some int changes which could acct for pcxr findings, also if endocarditis could have new worsening valvylar dz causing pulm edema, but overall he appears volume depleted, LA has cleared, allow pos balance, if drops BP would bolus back, dc any lasix, he is on limited O2 or resp failure, so we have some room for volume, I also noted echo with small LV cavity, slight mid cavity gradient and low BNP, ok for pos balance, provide vanc, ceftriaxone, may need TEE, may need line, hope to avoid, admit to [facility] sdu/icu, will follow, I updated wife and pt, this is NOT meningitis, enceph, dc acyclovir The patient is critically ill with multiple organ systems failure and requires high complexity decision making for assessment and support, frequent evaluation and titration of therapies, application of advanced monitoring technologies and extensive interpretation of multiple databases.   Critical Care Time devoted to patient care services described in this note is30  Minutes. This time reflects time of care of this signee: Merrie Roof, MD FACP. This critical care time does not reflect procedure time, or teaching time or supervisory time of PA/NP/Med student/Med Resident etc but could involve care discussion time. Rest  per NP/medical resident whose note is outlined above and that I agree with   Lavon Paganini. Titus Mould, MD, [place] Pgr: [facility] 12/23/2014 9:37 PM

## 2014-12-23 NOTE — ED Notes (Signed)
UNABLE TO COLLECT BLOOD AT THIS TIME ULTRASOUND WORKING WITH PATIENT.

## 2014-12-23 NOTE — Progress Notes (Addendum)
[facility] RN notified that pt has gram positive cocci and cluster aerobic and non-aerobic blood cultures.

## 2014-12-23 NOTE — ED Notes (Signed)
Patient on bed pan, attempting BM. Lab at bedside to collect blood.

## 2014-12-23 NOTE — ED Notes (Signed)
Critical lactic of 2.7 and PTT >200.

## 2014-12-23 NOTE — ED Notes (Signed)
US at bedside

## 2014-12-24 ENCOUNTER — Inpatient Hospital Stay (HOSPITAL_COMMUNITY): Payer: Medicare Other

## 2014-12-24 ENCOUNTER — Encounter (HOSPITAL_COMMUNITY): Payer: Self-pay | Admitting: Student

## 2014-12-24 DIAGNOSIS — A419 Sepsis, unspecified organism: Secondary | ICD-10-CM

## 2014-12-24 DIAGNOSIS — R1030 Lower abdominal pain, unspecified: Secondary | ICD-10-CM | POA: Insufficient documentation

## 2014-12-24 LAB — BASIC METABOLIC PANEL
ANION GAP: 6 (ref 5–15)
BUN: 33 mg/dL — AB (ref 6–20)
CALCIUM: 7.9 mg/dL — AB (ref 8.9–10.3)
CO2: 26 mmol/L (ref 22–32)
Chloride: 106 mmol/L (ref 101–111)
Creatinine, Ser: 1.85 mg/dL — ABNORMAL HIGH (ref 0.61–1.24)
GFR calc Af Amer: 36 mL/min — ABNORMAL LOW (ref >60)
GFR, EST NON AFRICAN AMERICAN: 31 mL/min — AB (ref >60)
Glucose, Bld: 123 mg/dL — ABNORMAL HIGH (ref 65–99)
POTASSIUM: 3.8 mmol/L (ref 3.5–5.1)
SODIUM: 138 mmol/L (ref 135–145)

## 2014-12-24 LAB — URINE CULTURE: CULTURE: NO GROWTH

## 2014-12-24 LAB — GLUCOSE, CAPILLARY
GLUCOSE-CAPILLARY: 118 mg/dL — AB (ref 65–99)
Glucose-Capillary: 110 mg/dL — ABNORMAL HIGH (ref 65–99)
Glucose-Capillary: 154 mg/dL — ABNORMAL HIGH (ref 65–99)
Glucose-Capillary: 162 mg/dL — ABNORMAL HIGH (ref 65–99)
Glucose-Capillary: 137 mg/dL — ABNORMAL HIGH (ref 65–99)

## 2014-12-24 LAB — TSH: TSH: 7.86 u[IU]/mL — ABNORMAL HIGH (ref 0.350–4.500)

## 2014-12-24 LAB — CBC
HCT: 38.1 % — ABNORMAL LOW (ref 39.0–52.0)
HEMOGLOBIN: 12.1 g/dL — AB (ref 13.0–17.0)
MCH: 31.7 pg (ref 26.0–34.0)
MCHC: 31.8 g/dL (ref 30.0–36.0)
MCV: 99.7 fL (ref 78.0–100.0)
PLATELETS: 121 10*3/uL — AB (ref 150–400)
RBC: 3.82 MIL/uL — ABNORMAL LOW (ref 4.22–5.81)
RDW: 14.5 % (ref 11.5–15.5)
WBC: 9.6 10*3/uL (ref 4.0–10.5)

## 2014-12-24 LAB — MAGNESIUM: MAGNESIUM: 1.8 mg/dL (ref 1.7–2.4)

## 2014-12-24 LAB — ABO/RH: ABO/RH(D): O POS

## 2014-12-24 LAB — PROTIME-INR
INR: 2.23 — AB (ref 0.00–1.49)
PROTHROMBIN TIME: 24.5 s — AB (ref 11.6–15.2)

## 2014-12-24 LAB — CORTISOL: CORTISOL PLASMA: 18.4 ug/dL

## 2014-12-24 MED ORDER — OXYCODONE-ACETAMINOPHEN 5-325 MG PO TABS
1.0000 | ORAL_TABLET | Freq: Four times a day (QID) | ORAL | Status: DC | PRN
  Administered 2014-12-24 – 2014-12-26 (×6): 2 via ORAL
  Administered 2014-12-27: 1 via ORAL
  Administered 2014-12-28 – 2014-12-30 (×6): 2 via ORAL
  Filled 2014-12-24 (×13): qty 2

## 2014-12-24 MED ORDER — OXYCODONE-ACETAMINOPHEN 5-325 MG PO TABS
1.0000 | ORAL_TABLET | ORAL | Status: DC | PRN
  Filled 2014-12-24: qty 2

## 2014-12-24 MED ORDER — OXYCODONE-ACETAMINOPHEN 5-325 MG PO TABS
2.0000 | ORAL_TABLET | Freq: Once | ORAL | Status: AC
Start: 2014-12-24 — End: 2014-12-24
  Administered 2014-12-24: 2 via ORAL

## 2014-12-24 NOTE — Progress Notes (Signed)
[facility] has been arranged to pick up patient and transport to [facility] by 0830 on 12/25/14.

## 2014-12-24 NOTE — Progress Notes (Addendum)
    [facility] has been requested to perform a transesophageal echocardiogram on [name]  for bacteremia in setting of aortic valve replacement (tissue valve - 2006).  After careful review of history and examination, the risks and benefits of transesophageal echocardiogram have been explained including risks of esophageal damage, perforation (1:10,000 risk), bleeding, pharyngeal hematoma as well as other potential complications associated with conscious sedation including aspiration, arrhythmia, respiratory failure and death. Alternatives to treatment were discussed, questions were answered. Patient is willing to proceed.   BP has been 89/44 - 144/67 in the past 24 hours. No requirement for pressors. Hgb stable at 12.1. Platelets at 121.  Procedure scheduled on 12/25/2014 with Dr. Oval Linsey at 1000. Will need [facility] to [facility] and needs to be at [facility] by 0830. Patient's nurse notified of this and will arrange transport with [place].  Erma Heritage, PA-C 12/24/2014 10:21 AM

## 2014-12-24 NOTE — Progress Notes (Signed)
Pt had foley catheter removed around 0930 on 12/24/14. Pt had not voided. Pt was bladder scanned. The bladder scan showed 136mL. Pt had no discomfort or urge to void. MD made aware. Received orders to repeat bladder scan in 4 hours. Will continue to monitor.

## 2014-12-24 NOTE — Progress Notes (Signed)
PULMONARY / CRITICAL CARE MEDICINE   Name: [name] MRN: [id number] DOB: 11/24/1925    ADMISSION DATE:  12/22/2014 CONSULTATION DATE:  12/23/14  REFERRING MD :  Dr. Erlinda Hong / [facility]   CHIEF COMPLAINT:  Groin pain, headache, fever  INITIAL PRESENTATION: 81 y/o M who presented to [facility] on 10/25 with L>R inguinal pain, chills, fever and headache.  Work up concerning for SIRS without clear source of infection.  PCCM consulted for evaluation.   STUDIES:  10/25 CT ABD/Pelvis >> left inguinal region unremarkable, marked elevation of the L hemi-diaphragm with displacement of the stomach, head & proximal body of the pancreas, small / large bowel loops & spleen superiorly.  Small renal cyst, calcification of abd aorta, left basilar atx 10/25 CT Head >> no acute intracranial abnormality, mild cortical volume loss, scattered small vessel ischemic disease 10/25  Scrotal US >> negative for testicular torsion, septated bilateral epididymal cysts noted otherwise unremarkable.   SIGNIFICANT EVENTS: 10/25  Admit with fever, L groin pain, and headache.   GPC bacteremia   HISTORY OF PRESENT ILLNESS:  81 y/o M with PMH of CAD status post CABG/AVR on Coumadin, symptomatically bradycardia S/P pacemaker, DVT, GERD, kidney stones, diverticulosis, paraesophageal hernia repair, BPH, chronic left foot drop (wears a brace) and diabetes mellitus who presented to [facility] on 10/24 with complaints of acute onset bilateral (left greater than right) groin pain, fever and headache.  The patient reported he woke up the day of admission (10/24) in his usual state of health. Patient reported he was seen at the Coumadin clinic for adjustment of medication. Around 5 PM he developed acute onset bilateral groin pain, left greater than right.  He reports he has had episodes similar to this in the past that he "pushes through".  He has been hospitalized one other time for a similar episode and was found to have kidney stones.  He attempted  Tylenol without relief. The patient's wife also reported shaking chills at home.  After arrival to the ER, he developed headache and fever.  Initial temperature 100.5 however he developed a Tmax of 104.8 in ER.  Initial lab workup: NA 140, K3.9, creatinine 1.58, lactic acid 1.8, W BC 10.4, hemoglobin 12.7, neutrophils 93%, INR 4.38. Influenza PCR was negative. Initial chest x-ray demonstrated evidence of mild pulmonary edema with cephalization, cardiomegaly but no overt infiltrate. A CT of the abdomen/pelvis was assessed which was negative for acute process in the abdomen/groin. Basilar atelectasis was noted. An ultrasound of the scrotum was assessed which was negative for testicular torsion, septated bilateral epididymal cysts noted otherwise unremarkable. Urinalysis was negative. Urine culture is pending. Throughout his ER course he developed headache and was treated with 4 mg of IV morphine.  Initially he was treated with hydration. However, follow-up chest x-ray 10/25 (post volume administration) demonstrated an increase in vascular congestion, moderate left-sided pleural effusion and concerns for worsening pulmonary edema.  He then was treated with IV Lasix. He also developed fever to 104. Lactic acid was repeated with a noted increased to 2.8. He also was noted to develop transient desaturations - question if this was related to/correlated with morphine administration/period of sedation??   He was empirically covered with vancomycin and Zosyn. The patient was admitted per [facility] hospitalist's for further evaluation. PCCM consulted for evaluation of SIRS.  At baseline he continues to be very active. The patient continues to drive is completely independent of all ADLs. 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  has past surgical history that includes Coronary artery bypass graft; Aortic valve replacement; Paraesophageal hernia repair; Hernia repair; Pacemaker insertion (10-04-2012); Esophagogastroduodenoscopy (N/A, 12/16/2013); Balloon dilation (N/A, 12/16/2013); and permanent pacemaker insertion (N/A, 10/04/2012).    Prior to Admission medications   Medication Sig Start Date End Date Taking? Authorizing Provider  Coenzyme Q10 (CO Q 10 PO) Take 1 capsule by mouth daily.   Yes Historical Provider, MD  furosemide (LASIX) 20 MG tablet Take 20 mg by mouth every other day.   Yes Historical Provider, MD  glimepiride (AMARYL) 1 MG tablet Take 1 mg by mouth 2 (two) times daily.    Yes Historical Provider, MD  nitroGLYCERIN (NITROSTAT) 0.4 MG SL tablet Place 1 tablet (0.4 mg total) under the tongue every 5 (five) minutes x 3 doses as needed for chest pain. 10/05/12  Yes Jettie Booze, MD  ramipril (ALTACE) 2.5 MG capsule Take 1 capsule (2.5 mg total) by mouth daily. Patient taking differently: Take 2.5 mg by mouth every other day.  02/25/14  Yes Jettie Booze, MD  warfarin (COUMADIN) 5 MG tablet Take as directed by coumadin clinic Patient taking differently: Take 2.5 mg by mouth daily at 6 PM. Take as directed by coumadin clinic 09/24/14  Yes Jettie Booze, MD   Allergies  Allergen Reactions  . Demerol [Meperidine] Other (See Comments)    "hallucinations"    FAMILY HISTORY:  indicated that his mother is deceased. He indicated that his father is deceased. He indicated that his maternal  grandmother is deceased. He indicated that his maternal grandfather is deceased. He indicated that his paternal grandmother is deceased. He indicated that his paternal grandfather is deceased.    SOCIAL HISTORY:  reports that he has never smoked. He has never used smokeless tobacco. He reports that he does not drink alcohol or use illicit drugs.   SUBJECTIVE:  Feels better. Mental status is much better. Sitting up in chair.  VITAL SIGNS: Temp:  [97.4 F (36.3 C)-101.9 F (38.8 C)] 97.9 F (36.6 C) (10/26 0756) Pulse Rate:  [58-63] 59 (10/26 0900) Resp:  [18-31] 19 (10/26 0900) BP: (83-144)/(40-67) 100/55 mmHg (10/26 0900) SpO2:  [95 %-100 %] 95 % (10/26 0900)   HEMODYNAMICS:   VENTILATOR SETTINGS:   INTAKE / OUTPUT:  Intake/Output Summary (Last 24 hours) at 12/24/14 0925 Last data filed at 12/24/14 0800  Gross per 24 hour  Intake   1275 ml  Output    315 ml  Net    960 ml    PHYSICAL EXAMINATION: General:  Elderly male in NAD,  Neuro:  AAOx4, chronic L foot drop HEENT:  Moist mucus membranes, NO JVD, Cardiovascular:  S1, S2, No MRG.  Lungs:  Non laboured breathing. No wheeze or crackles.  Abdomen:  Obese, soft, + BS Musculoskeletal:  No acute deformities, L foot drop Skin:  Intact, 2+ pitting edema in BLE   LABS:  CBC  Recent Labs Lab 12/22/14 2345 12/23/14 0648 12/24/14 0407  WBC 9.6 10.4 9.6  HGB 13.5 12.7* 12.1*  HCT 42.5  39.7 38.1*  PLT 170 132* 121*   Coag's  Recent Labs Lab 12/22/14 0905 12/23/14 0648 12/24/14 0407  APTT  --  >200*  --   INR 1.9 4.38* 2.23*   BMET  Recent Labs Lab 12/23/14 0648 12/23/14 1156 12/24/14 0407  NA 139 141 138  K 3.8 3.8 3.8  CL 108 108 106  CO2 23 25 26   BUN 28* 29* 33*  CREATININE 1.73* 1.85* 1.85*  GLUCOSE 202* 114* 123*   Electrolytes  Recent Labs Lab 12/23/14 0648 12/23/14 1156 12/24/14 0407  CALCIUM 8.1* 8.3* 7.9*  MG  --   --  1.8   Sepsis Markers  Recent Labs Lab 12/23/14 0346  12/23/14 0648 12/23/14 0654 12/23/14 1157  LATICACIDVEN 2.61*  --  2.7* 2.3*  PROCALCITON  --  1.70  --   --    ABG No results for input(s): PHART, PCO2ART, PO2ART in the last 168 hours.   Liver Enzymes  Recent Labs Lab 12/23/14 0648 12/23/14 1156 12/23/14 1818  AST 39 41 48*  ALT 19 19 22   ALKPHOS 38 37* 30*  BILITOT 1.6* 1.3* 1.5*  ALBUMIN 3.5 3.3* 3.1*   Cardiac Enzymes No results for input(s): TROPONINI, PROBNP in the last 168 hours.   Glucose  Recent Labs Lab 12/23/14 0811 12/23/14 1154 12/23/14 1727 12/23/14 2212 12/24/14 0743  GLUCAP 167* 105* 122* 118* 110*    Imaging Dg Chest Port 1 View  12/24/2014  CLINICAL DATA:  Pulmonary edema, history of previous CABG and aortic valve replacement EXAM: PORTABLE CHEST 1 VIEW COMPARISON:  Portable chest x-ray of December 23, 2014 FINDINGS: The cardiopericardial silhouette remains enlarged. The prosthetic aortic valve cage is visible. The left hemidiaphragm remains obscured. The pulmonary interstitial markings remain increased but are slightly less conspicuous today. Left lower lobe atelectasis or pneumonia is present and stable. Mild elevation of the left hemidiaphragm is suspected. The permanent pacemaker is unchanged in position. There are 8 intact sternal wires from previous median sternotomy. IMPRESSION: Slight interval improvement in pulmonary interstitial edema secondary to CHF. Persistent left lower lobe atelectasis and/or pneumonia. Electronically Signed   By: David  Martinique M.D.   On: 12/24/2014 07:08   Dg Hips Bilat With Pelvis 2v  12/23/2014  CLINICAL DATA:  Bilateral hip pain, more severe on the left than on the right. No history of recent trauma EXAM: DG HIP (WITH OR WITHOUT PELVIS) 2V EACH HIP COMPARISON:  None. FINDINGS: Frontal pelvis as well as frontal and lateral views of each hip were obtained, total five views. There is no demonstrable fracture or dislocation. The joint spaces appear symmetric and within  normal limits. No erosive change. There is soft tissue calcification lateral to the each proximal femur at the respective intertrochanteric levels. There is degenerative change in the lower lumbar spine. IMPRESSION: No fracture or dislocation. No appreciable hip joint space arthropathy. Degenerative change is noted in the lower lumbar spine. Electronically Signed   By: Lowella Grip III M.D.   On: 12/23/2014 11:51     ASSESSMENT / PLAN:  PULMONARY OETT A: Pulmonary Edema -  PAH - PAS 63 on ECHO 09/2012 P:   Oxygen as needed to support sats > 92%  CARDIOVASCULAR CVL A:  Hypotension / SIRS - unclear etiology  CAD s/p CABG  AS s/p AVR  Permanent AF on Coumadin  PVD Elevated lactic acid- improving P:  SDU monitoring  Restart coumadin as INR is 2.23 Follow lactic acid Hold home ACE-I, lasix  RENAL A:   Lactic Acidosis - resolving  Acute on Chronic Kidney Injury - baseline sr cr ~ 1.3  P:   Hold further lasix Trend BMP / UOP  Replace electrolytes as indicated   GASTROINTESTINAL A:   Constipation - large stool burden noted in rectum on CT  P:   PRN dulcolax  Diet as tolerated   HEMATOLOGIC A:   Coagulopathy on Chronic Coumadin Hx DVT  P:  Follow CBC Daily INR See cardiology section   INFECTIOUS A:   Fever - no meningeal signs on exam or concern for meningitis /encephalitis  SIRS - no clear source infection P:   BCx2 10/25 >> GPCs UC 10/25 >>  Flu PCR 10/25 >> neg   Rocephin, start date 10/25, day 1/x Vanco, start date 10/25, day 1/x   Trend PCT  Follow cultures. X ray of hips to check for osteo. TTE looks ok. He may need a TEE.  ENDOCRINE A:   DM II    P:   Management per primary SVC  HOLD home amaryl SSI   NEUROLOGIC A:   Pain - inguinal, unclear etiology  Headache P:   RASS goal: n/a PRN percocet for pain No indication for LP at this time.   FAMILY  - Updates: Patient updated on patients status.  10/26 - Inter-disciplinary  family meet or Palliative Care meeting due by:   10/31  Monitor on SDU, PCCM will follow.   Marshell Garfinkel MD  Pulmonary and Critical Care Pager [phone] If no answer or after 3pm call: [phone] 12/24/2014, 9:26 AM

## 2014-12-24 NOTE — Progress Notes (Addendum)
[facility] PROGRESS NOTE  [name] XTK:[id number] DOB: Jul 04, 1925 DOA: 12/22/2014 PCP: Wenda Low, MD  Assessment/Plan: 1. Sepsis -Present on admission, evidenced by a temperature of 104.8, with respiratory rate of 38, blood pressures of 83/40, lactic acid of 2.7, having positive blood cultures. -Workup this far has included abdominal and pelvis CT scan that did not reveal acute intra-abdominal pathology. Radiology reported market elevation of the left hemi-diaphragm with displacement of the stomach head and proximal body of the pancreas. No evidence of acute infection. Scrotal ultrasound did not show evidence for testicular torsion. -Given his cardiac history including pacemaker implant, I think it would be prudent to proceed with a transesophageal echocardiogram. Transthoracic echocardiogram was unremarkable. Case was discussed with cardiology, plan for TEE in am.  -Will repeat blood cultures in a.m. -He remains hemodynamically stable now, having last blood pressure 144/70. Will transfer patient out of stepdown unit.  2.  History of aortic valve replacement with by prostatic valve -As mentioned above patient presenting with sepsis, having blood cultures positive for gram-positive cocci 2 sets. -Case discussed with cardiology, will plan to further workup with transesophageal echocardiogram on 12/25/2014  3.  Type 2 diabetes mellitus -His blood sugars remain stable in the 110-154 range -Continue Accu-Cheks with sliding scale coverage  4.  Bilateral groin pain. -Patient complaining of pain over bilateral groin and inner thigh region. -On physical examination he did not appear to have bulging mass which would suggest hernia. He did have pain with internal rotation of his head. I wonder about the possibility of symptoms being secondary to osteoarthritis. Other possibilities would include lumbar stenosis or bursitis although unlikely. -Pending hip x-ray  5.   Hypertension. -Patient having a history of hypertension however since he presented with hypotension in setting of sepsis antihypertensive agents were discontinued -We'll continue to watch his blood pressures over the course of the day.  6.  History of atrial fibrillation CHADSVasc score of 2 -He presented with a supratherapeutic INR 4.38 for which Coumadin has been held. -INR trended down to 2.23 on 12/24/2014. -Will consult pharmacy for Coumadin management.  Code Status: Full code Family Communication: Family not present Disposition Plan: Will transfer to [place]   Consultants:  Critical care medicine  Cardiology  Antibiotics:  Vancomycin  Ceftriaxone  HPI/Subjective: Mr [name] is an 81 year old gentleman with a past medical history of coronary artery disease status post coronary artery bypass grafting, status post bioprosthetic aortic valve replacement, ace maker implant, chronic kidney disease who was admitted to medicine service on 12/23/2014. He presented with complaints of fever, chills, bilateral groin pain. On admission he had a temperature of 104.8. He was septic and started on broad-spectrum IV antimicrobial therapy with vancomycin and ceftriaxone. Workup included a CT scan of abdomen and pelvis with IV contrast which did not reveal acute intra-abdominal pathology. Scrotal ultrasound did not reveal evidence of testicular torsion. Given septic picture pulmonary critical care medicine was consulted.  Objective: Filed Vitals:   12/24/14 1519  BP:   Pulse: 59  Temp: 97.5 F (36.4 C)  Resp: 18    Intake/Output Summary (Last 24 hours) at 12/24/14 1521 Last data filed at 12/24/14 0800  Gross per 24 hour  Intake   1275 ml  Output    315 ml  Net    960 ml   Filed Weights   12/22/14 2200  Weight: 94.802 kg (209 lb)    Exam:   General:  Patient is nontoxic appearing, awake and alert in no acute distress.  He was assisted out of bed to chair  Cardiovascular:  Regular rate and rhythm normal S1-S2 no murmurs rubs or gallops  Respiratory: Normal respiratory effort, lungs are clear to auscultation bilaterally  Abdomen: Soft nontender nontender positive bowel sounds  Musculoskeletal: Patient reporting pain to his hip with internal rotation of bilateral lower extremities  Data Reviewed: Basic Metabolic Panel:  Recent Labs Lab 12/22/14 2345 12/23/14 0648 12/23/14 1156 12/24/14 0407  NA 140 139 141 138  K 3.9 3.8 3.8 3.8  CL 106 108 108 106  CO2 27 23 25 26   GLUCOSE 182* 202* 114* 123*  BUN 27* 28* 29* 33*  CREATININE 1.58* 1.73* 1.85* 1.85*  CALCIUM 8.9 8.1* 8.3* 7.9*  MG  --   --   --  1.8   Liver Function Tests:  Recent Labs Lab 12/22/14 2345 12/23/14 0648 12/23/14 1156 12/23/14 1818  AST 23 39 41 48*  ALT 13* 19 19 22   ALKPHOS 42 38 37* 30*  BILITOT 1.2 1.6* 1.3* 1.5*  PROT 7.2 6.4* 6.0* 5.9*  ALBUMIN 3.9 3.5 3.3* 3.1*   No results for input(s): LIPASE, AMYLASE in the last 168 hours. No results for input(s): AMMONIA in the last 168 hours. CBC:  Recent Labs Lab 12/22/14 2345 12/23/14 0648 12/24/14 0407  WBC 9.6 10.4 9.6  NEUTROABS 8.5* 9.8*  --   HGB 13.5 12.7* 12.1*  HCT 42.5 39.7 38.1*  MCV 98.6 98.5 99.7  PLT 170 132* 121*   Cardiac Enzymes: No results for input(s): CKTOTAL, CKMB, CKMBINDEX, TROPONINI in the last 168 hours. BNP (last 3 results)  Recent Labs  12/23/14 0336  BNP 306.2*    ProBNP (last 3 results) No results for input(s): PROBNP in the last 8760 hours.  CBG:  Recent Labs Lab 12/23/14 1154 12/23/14 1727 12/23/14 2212 12/24/14 0743 12/24/14 1204  GLUCAP 105* 122* 118* 110* 154*    Recent Results (from the past 240 hour(s))  Culture, blood (routine x 2)     Status: None (Preliminary result)   Collection Time: 12/22/14 10:45 PM  Result Value Ref Range Status   Specimen Description BLOOD RIGHT ARM  Final   Special Requests BOTTLES DRAWN AEROBIC AND ANAEROBIC 5ML  Final    Culture  Setup Time   Final    GRAM POSITIVE COCCI IN CLUSTERS IN BOTH AEROBIC AND ANAEROBIC BOTTLES CRITICAL RESULT CALLED TO, READ BACK BY AND VERIFIED WITH: Sonnie Alamo RN 1840 12/23/14 A BROWNING    Culture   Final    GRAM POSITIVE COCCI Performed at [facility]    Report Status PENDING  Incomplete  Culture, blood (routine x 2)     Status: None (Preliminary result)   Collection Time: 12/22/14 11:50 PM  Result Value Ref Range Status   Specimen Description BLOOD LEFT WRIST  Final   Special Requests BOTTLES DRAWN AEROBIC AND ANAEROBIC 5ML  Final   Culture  Setup Time   Final    GRAM POSITIVE COCCI IN CLUSTERS IN BOTH AEROBIC AND ANAEROBIC BOTTLES CRITICAL RESULT CALLED TO, READ BACK BY AND VERIFIED WITH: S ARMSTRONG RN 2029 12/23/14 A BROWNING    Culture   Final    GRAM POSITIVE COCCI Performed at [facility]    Report Status PENDING  Incomplete  Urine culture     Status: None   Collection Time: 12/23/14  2:08 AM  Result Value Ref Range Status   Specimen Description URINE, RANDOM  Final   Special Requests NONE  Final  Culture   Final    NO GROWTH 1 DAY Performed at [facility]    Report Status 12/24/2014 FINAL  Final  MRSA PCR Screening     Status: None   Collection Time: 12/23/14 10:37 AM  Result Value Ref Range Status   MRSA by PCR NEGATIVE NEGATIVE Final    Comment:        The GeneXpert MRSA Assay (FDA approved for NASAL specimens only), is one component of a comprehensive MRSA colonization surveillance program. It is not intended to diagnose MRSA infection nor to guide or monitor treatment for MRSA infections.      Studies: Ct Abdomen Pelvis Wo Contrast  12/23/2014  CLINICAL DATA:  Acute onset of left inguinal pain. Initial encounter. EXAM: CT ABDOMEN AND PELVIS WITHOUT CONTRAST TECHNIQUE: Multidetector CT imaging of the abdomen and pelvis was performed following the standard protocol without IV contrast. COMPARISON:  MRI of the lumbar  spine performed 11/24/2006 FINDINGS: Left basilar atelectasis is noted. Scattered coronary artery calcifications are seen. Postoperative change is noted at the aortic valve. The patient is status post median sternotomy. A pacemaker lead is partially imaged. There is marked elevation of the left hemidiaphragm, with displacement of the stomach, head and proximal body of the pancreas, small and large bowel loops and spleen superiorly. Surrounding postoperative change is noted. There is rightward displacement of the heart and distal descending thoracic aorta. The liver and spleen are unremarkable in appearance. The gallbladder is within normal limits. The pancreas and adrenal glands are unremarkable. A 1.2 cm cyst is noted at the interpole region of the right kidney. Nonspecific perinephric stranding is noted bilaterally, more prominent on the right. There is no evidence of hydronephrosis. A 3 mm stone is noted at the interpole region of the left kidney. No obstructing ureteral stones are seen. No free fluid is identified. The small bowel is unremarkable in appearance. The stomach is within normal limits. No acute vascular abnormalities are seen. Scattered calcification is noted along the abdominal aorta and its branches. The appendix is normal in caliber, without evidence of appendicitis. The colon is grossly unremarkable in appearance. The bladder is mildly distended and grossly unremarkable. The prostate remains normal in size. No inguinal lymphadenopathy is seen. The left inguinal region is unremarkable in appearance. No acute osseous abnormalities are identified. IMPRESSION: 1. Left inguinal region is unremarkable in appearance. 2. Marked elevation of the left hemidiaphragm, with displacement of the stomach, head and proximal body of the pancreas, small and large bowel loops and spleen superiorly. Surrounding postoperative change noted. Rightward displacement of the heart and distal descending thoracic aorta. 3.  Small right renal cyst noted. 3 mm nonobstructing stone at the interpole region of the left kidney. 4. Scattered calcification along the abdominal aorta and its branches. 5. Left basilar atelectasis noted. 6. Scattered coronary artery calcifications seen. Electronically Signed   By: Garald Balding M.D.   On: 12/23/2014 01:33   Ct Head Wo Contrast  12/23/2014  CLINICAL DATA:  Acute onset of headache and fever. Altered mental status. Initial encounter. EXAM: CT HEAD WITHOUT CONTRAST TECHNIQUE: Contiguous axial images were obtained from the base of the skull through the vertex without intravenous contrast. COMPARISON:  None. FINDINGS: There is no evidence of acute infarction, mass lesion, or intra- or extra-axial hemorrhage on CT. Prominence of the sulci suggests mild cortical volume loss. Scattered periventricular and subcortical white matter change likely reflects small vessel ischemic microangiopathy. Cerebellar atrophy is noted. The brainstem and fourth ventricle  are within normal limits. The basal ganglia are unremarkable in appearance. The cerebral hemispheres demonstrate grossly normal gray-white differentiation. No mass effect or midline shift is seen. There is no evidence of fracture; visualized osseous structures are unremarkable in appearance. The orbits are within normal limits. There is partial opacification of the right side of the sphenoid sinus. The remaining paranasal sinuses and mastoid air cells are well-aerated. No significant soft tissue abnormalities are seen. IMPRESSION: 1. No acute intracranial pathology seen on CT. 2. Mild cortical volume loss and scattered small vessel ischemic microangiopathy. 3. Partial opacification of the right side of the sphenoid sinus. Electronically Signed   By: Garald Balding M.D.   On: 12/23/2014 05:58   US Scrotum  12/23/2014  CLINICAL DATA:  Acute onset of groin pain.  Initial encounter. EXAM: SCROTAL ULTRASOUND DOPPLER ULTRASOUND OF THE TESTICLES  TECHNIQUE: Complete ultrasound examination of the testicles, epididymis, and other scrotal structures was performed. Color and spectral Doppler ultrasound were also utilized to evaluate blood flow to the testicles. COMPARISON:  None. FINDINGS: Right testicle Measurements: 3.8 x 2.1 x 2.8 cm. No mass or microlithiasis visualized. Left testicle Measurements: 4.2 x 2.3 x 2.6 cm. No mass or microlithiasis visualized. Right epididymis: A septated 1.4 x 0.9 x 0.9 cm cyst is noted at the body of the right epididymis. The right epididymis is otherwise unremarkable. Left epididymis: A 1.6 x 0.8 x 1.3 cm septated cyst is noted at the left epididymal tail. The left epididymis is otherwise unremarkable. Hydrocele:  None visualized. Varicocele:  None visualized. Pulsed Doppler interrogation of both testes demonstrates normal low resistance arterial and venous waveforms bilaterally. IMPRESSION: 1. No evidence of testicular torsion. 2. Septated bilateral epididymal cysts noted, measuring 1.4 cm on the right and 1.6 cm on the left. Epididymides otherwise unremarkable. Electronically Signed   By: Garald Balding M.D.   On: 12/23/2014 04:18   [place] Art/ven Flow Abd Pelv Doppler  12/23/2014  CLINICAL DATA:  Acute onset of groin pain.  Initial encounter. EXAM: SCROTAL ULTRASOUND DOPPLER ULTRASOUND OF THE TESTICLES TECHNIQUE: Complete ultrasound examination of the testicles, epididymis, and other scrotal structures was performed. Color and spectral Doppler ultrasound were also utilized to evaluate blood flow to the testicles. COMPARISON:  None. FINDINGS: Right testicle Measurements: 3.8 x 2.1 x 2.8 cm. No mass or microlithiasis visualized. Left testicle Measurements: 4.2 x 2.3 x 2.6 cm. No mass or microlithiasis visualized. Right epididymis: A septated 1.4 x 0.9 x 0.9 cm cyst is noted at the body of the right epididymis. The right epididymis is otherwise unremarkable. Left epididymis: A 1.6 x 0.8 x 1.3 cm septated cyst is noted at the  left epididymal tail. The left epididymis is otherwise unremarkable. Hydrocele:  None visualized. Varicocele:  None visualized. Pulsed Doppler interrogation of both testes demonstrates normal low resistance arterial and venous waveforms bilaterally. IMPRESSION: 1. No evidence of testicular torsion. 2. Septated bilateral epididymal cysts noted, measuring 1.4 cm on the right and 1.6 cm on the left. Epididymides otherwise unremarkable. Electronically Signed   By: Garald Balding M.D.   On: 12/23/2014 04:18   Dg Chest Port 1 View  12/24/2014  CLINICAL DATA:  Pulmonary edema, history of previous CABG and aortic valve replacement EXAM: PORTABLE CHEST 1 VIEW COMPARISON:  Portable chest x-ray of December 23, 2014 FINDINGS: The cardiopericardial silhouette remains enlarged. The prosthetic aortic valve cage is visible. The left hemidiaphragm remains obscured. The pulmonary interstitial markings remain increased but are slightly less conspicuous today. Left lower lobe  atelectasis or pneumonia is present and stable. Mild elevation of the left hemidiaphragm is suspected. The permanent pacemaker is unchanged in position. There are 8 intact sternal wires from previous median sternotomy. IMPRESSION: Slight interval improvement in pulmonary interstitial edema secondary to CHF. Persistent left lower lobe atelectasis and/or pneumonia. Electronically Signed   By: David  Martinique M.D.   On: 12/24/2014 07:08   Dg Chest Port 1 View  12/23/2014  CLINICAL DATA:  Acute onset of worsening shortness of breath. Initial encounter. EXAM: PORTABLE CHEST 1 VIEW COMPARISON:  Chest radiograph performed 12/22/2014 FINDINGS: Increased vascular congestion is noted, with bilateral central airspace opacities and a moderate left-sided pleural effusion. Findings are compatible with worsening pulmonary edema. No pneumothorax is seen. The cardiomediastinal silhouette is enlarged. The patient is status post median sternotomy. A pacemaker is noted overlying  the left chest wall, with a single lead ending overlying the right ventricle. Evaluation is suboptimal due to patient rotation. No acute osseous abnormalities are identified. IMPRESSION: Increased vascular congestion and cardiomegaly, with bilateral central airspace opacities and a moderate left-sided pleural effusion. Findings are compatible with worsening pulmonary edema. Electronically Signed   By: Garald Balding M.D.   On: 12/23/2014 03:46   Dg Chest Port 1 View  12/22/2014  CLINICAL DATA:  81 year old male with fever at 5 p.m. today. Currently being treated for kidney stones. EXAM: PORTABLE CHEST 1 VIEW COMPARISON:  Chest x-ray 06/2013 FINDINGS: Elevation of left hemidiaphragm. Opacities at the left lung base may reflect atelectasis and/or scarring, and are similar to the prior examinations. Possible small chronic left pleural effusion. Right lung appears clear. Mild diffuse coarsening of interstitial markings appears to be chronic. Mild cephalization of the pulmonary vasculature. Heart size is mildly enlarged. The patient is rotated to the left on today's exam, resulting in distortion of the mediastinal contours and reduced diagnostic sensitivity and specificity for mediastinal pathology. Atherosclerosis in the thoracic aorta. Status post median sternotomy for CABG and aortic valve replacement (a stented bioprosthesis is noted). Left-sided pacemaker device in position with lead tip projecting over the expected location of the right ventricular apex. IMPRESSION: 1. The appearance the chest again suggests mild congestive heart failure, as above. 2. Chronic elevation of left hemidiaphragm is unchanged and again associated with extensive areas of chronic scarring or subsegmental atelectasis in the left lower lobe. Probable small chronic left pleural effusion also noted. 3. Atherosclerosis. 4. Postoperative changes and support apparatus, as above. Electronically Signed   By: Vinnie Langton M.D.   On:  12/22/2014 23:50   Dg Hips Bilat With Pelvis 2v  12/23/2014  CLINICAL DATA:  Bilateral hip pain, more severe on the left than on the right. No history of recent trauma EXAM: DG HIP (WITH OR WITHOUT PELVIS) 2V EACH HIP COMPARISON:  None. FINDINGS: Frontal pelvis as well as frontal and lateral views of each hip were obtained, total five views. There is no demonstrable fracture or dislocation. The joint spaces appear symmetric and within normal limits. No erosive change. There is soft tissue calcification lateral to the each proximal femur at the respective intertrochanteric levels. There is degenerative change in the lower lumbar spine. IMPRESSION: No fracture or dislocation. No appreciable hip joint space arthropathy. Degenerative change is noted in the lower lumbar spine. Electronically Signed   By: Lowella Grip III M.D.   On: 12/23/2014 11:51    Scheduled Meds: . cefTRIAXone (ROCEPHIN)  IV  2 g Intravenous Q24H  . insulin aspart  0-9 Units Subcutaneous TID WC  .  sodium chloride  3 mL Intravenous Q12H  . vancomycin  1,250 mg Intravenous Q24H   Continuous Infusions: . sodium chloride 75 mL/hr at 12/24/14 0556    Principal Problem:   Sepsis ([place]) Active Problems:   Essential hypertension, benign   Cardiac pacemaker in situ   Fever, unspecified   Type 2 diabetes mellitus with vascular disease (HCC)   CKD (chronic kidney disease) stage 3, GFR 30-59 ml/min   Bilateral hip pain   Elevated lactic acid level   Pulmonary edema   Groin pain    Time spent: 40 min    Kelvin Cellar  [facility] Pager [phone]. If 7PM-7AM, please contact night-coverage at [email or web address], password [facility] 12/24/2014, 3:21 PM  LOS: 1 day

## 2014-12-24 NOTE — Progress Notes (Signed)
Did in & out cath on pt, removed 400cc of fluid from bladder. Carmela Hurt, RN 9:31 PM 12/24/2014

## 2014-12-24 NOTE — Clinical Documentation Improvement (Signed)
Internal Medicine Critical Care  Can the diagnosis of CHF be further specified?    Acuity - Acute, Chronic, Acute on Chronic   Type - Systolic, Diastolic, Systolic and Diastolic  Other  Clinically Undetermined  Supporting Information: "CHF with history of CAD s/p CABG" currently documented.  Echo done 12/23/2014.    Please exercise your independent, professional judgment when responding. A specific answer is not anticipated or expected. Please update your documentation within the medical record to reflect your response to this query.  Thank you, Mateo Flow, RN [phone] Clinical Documentation Specialist

## 2014-12-24 NOTE — Progress Notes (Signed)
Stood pt up and unable to void still, did another bladder scan about 400cc was shown. MD notified. Doing an in & out cath now per MD order. Carmela Hurt, RN 9:24 PM 12/24/2014

## 2014-12-24 NOTE — Evaluation (Signed)
Physical Therapy Evaluation Patient Details Name: [name] MRN: [id number] DOB: 06/11/1925 Today's Date: 12/24/2014   History of Present Illness  81 year old male with a past medical history of coronary artery disease status post coronary artery bypass grafting, status post bioprosthetic aortic valve replacement, pacemaker implant, chronic kidney disease and admitted with c/o bilateral groin pain, sepsis   Clinical Impression  Pt admitted with above diagnosis. Pt currently with functional limitations due to the deficits listed below (see PT Problem List). Session limited by bilateral groin pain.  Pt unable to tolerate elevating leg rest of recliner.  Pt assisted to standing however with increased pain which eased after transition.  Pt unable to march in place therefore did not attempt ambulation at this time.  Pt anticipate relief of pain during acute stay and feels once pain better, mobility will improve however if pain persists, pt may need SNF. Pt will benefit from skilled PT to increase their independence and safety with mobility to allow discharge to the venue listed below.       Follow Up Recommendations Home health PT;Supervision/Assistance - 24 hour    Equipment Recommendations  None recommended by PT    Recommendations for Other Services       Precautions / Restrictions Precautions Precautions: Fall Restrictions Weight Bearing Restrictions: No      Mobility  Bed Mobility               General bed mobility comments: pt up in recliner on arrival  Transfers Overall transfer level: Needs assistance Equipment used: Rolling walker (2 wheeled) Transfers: Sit to/from Stand Sit to Stand: Mod assist         General transfer comment: pt rrequired mod assist to rise and control descent due to bilateral groin pain, pain better after a minute with standing, pt unable to lift feet from floor/march in place, stood approx 4 minutes until pt requested return to  sitting  Ambulation/Gait Ambulation/Gait assistance:  (pt unable at this time)              Science writer    Modified Rankin (Stroke Patients Only)       Balance Overall balance assessment: Needs assistance         Standing balance support: Bilateral upper extremity supported Standing balance-Leahy Scale: Poor Standing balance comment: requires UE support of RW                             Pertinent Vitals/Pain Pain Assessment: 0-10 Pain Score: 10-Worst pain ever Pain Location: bilateral groin Pain Descriptors / Indicators: Aching;Stabbing Pain Intervention(s): Limited activity within patient's tolerance;Monitored during session;Repositioned;Patient requesting pain meds-RN notified    Home Living Family/patient expects to be discharged to:: Private residence Living Arrangements: Spouse/significant other Available Help at Discharge: Family Type of Home: House Home Access: Stairs to enter   Technical brewer of Steps: 3 Home Layout: One level Home Equipment: Walker - standard;Cane - single point      Prior Function Level of Independence: Independent with assistive device(s)         Comments: ambulatory with RW, still driving     Hand Dominance        Extremity/Trunk Assessment               Lower Extremity Assessment: RLE deficits/detail;LLE deficits/detail   LLE Deficits / Details: hx L foot drop (per pt  from back injury in the '50s), has AFO     Communication   Communication: HOH  Cognition Arousal/Alertness: Awake/alert Behavior During Therapy: WFL for tasks assessed/performed Overall Cognitive Status: Within Functional Limits for tasks assessed                      General Comments      Exercises        Assessment/Plan    PT Assessment Patient needs continued PT services  PT Diagnosis Difficulty walking;Acute pain   PT Problem List Decreased strength;Decreased  mobility;Pain  PT Treatment Interventions DME instruction;Gait training;Patient/family education;Functional mobility training;Therapeutic activities;Therapeutic exercise   PT Goals (Current goals can be found in the Care Plan section) Acute Rehab PT Goals Patient Stated Goal: decrease pain, return to PLOF PT Goal Formulation: With patient/family Time For Goal Achievement: 01/07/15 Potential to Achieve Goals: Good    Frequency Min 3X/week   Barriers to discharge        Co-evaluation               End of Session Equipment Utilized During Treatment: Gait belt Activity Tolerance: Patient limited by pain Patient left: in chair;with call bell/phone within reach;with family/visitor present Nurse Communication: Mobility status;Patient requests pain meds         Time: 8469-6295 PT Time Calculation (min) (ACUTE ONLY): 17 min   Charges:   PT Evaluation $Initial PT Evaluation Tier I: 1 Procedure     PT G Codes:        Kota Ciancio,KATHrine E 12/24/2014, 3:44 PM Carmelia Bake, PT, DPT 12/24/2014 Pager: [phone]

## 2014-12-25 ENCOUNTER — Encounter (HOSPITAL_COMMUNITY)
Admission: EM | Disposition: A | Discharge status: Skilled Nursing Facility | Payer: Self-pay | Source: Home / Self Care | Attending: Internal Medicine

## 2014-12-25 ENCOUNTER — Inpatient Hospital Stay (HOSPITAL_COMMUNITY): Payer: Medicare Other

## 2014-12-25 ENCOUNTER — Encounter (HOSPITAL_COMMUNITY): Payer: Self-pay

## 2014-12-25 ENCOUNTER — Ambulatory Visit (HOSPITAL_COMMUNITY): Admission: RE | Admit: 2014-12-25 | Payer: Medicare Other | Source: Ambulatory Visit | Admitting: Cardiovascular Disease

## 2014-12-25 DIAGNOSIS — Z952 Presence of prosthetic heart valve: Secondary | ICD-10-CM

## 2014-12-25 DIAGNOSIS — A4101 Sepsis due to Methicillin susceptible Staphylococcus aureus: Principal | ICD-10-CM

## 2014-12-25 DIAGNOSIS — I340 Nonrheumatic mitral (valve) insufficiency: Secondary | ICD-10-CM

## 2014-12-25 HISTORY — PX: TEE WITHOUT CARDIOVERSION: SHX5443

## 2014-12-25 LAB — CULTURE, BLOOD (ROUTINE X 2)

## 2014-12-25 LAB — BASIC METABOLIC PANEL
ANION GAP: 7 (ref 5–15)
BUN: 33 mg/dL — ABNORMAL HIGH (ref 6–20)
CHLORIDE: 105 mmol/L (ref 101–111)
CO2: 25 mmol/L (ref 22–32)
CREATININE: 1.54 mg/dL — AB (ref 0.61–1.24)
Calcium: 8 mg/dL — ABNORMAL LOW (ref 8.9–10.3)
GFR calc Af Amer: 45 mL/min — ABNORMAL LOW (ref >60)
GFR calc non Af Amer: 39 mL/min — ABNORMAL LOW (ref >60)
GLUCOSE: 121 mg/dL — AB (ref 65–99)
Potassium: 3.9 mmol/L (ref 3.5–5.1)
Sodium: 137 mmol/L (ref 135–145)

## 2014-12-25 LAB — GLUCOSE, CAPILLARY
GLUCOSE-CAPILLARY: 112 mg/dL — AB (ref 65–99)
GLUCOSE-CAPILLARY: 85 mg/dL (ref 65–99)
Glucose-Capillary: 107 mg/dL — ABNORMAL HIGH (ref 65–99)

## 2014-12-25 LAB — CBC
HEMATOCRIT: 37.7 % — AB (ref 39.0–52.0)
HEMOGLOBIN: 12 g/dL — AB (ref 13.0–17.0)
MCH: 31.3 pg (ref 26.0–34.0)
MCHC: 31.8 g/dL (ref 30.0–36.0)
MCV: 98.2 fL (ref 78.0–100.0)
Platelets: 99 10*3/uL — ABNORMAL LOW (ref 150–400)
RBC: 3.84 MIL/uL — ABNORMAL LOW (ref 4.22–5.81)
RDW: 14.6 % (ref 11.5–15.5)
WBC: 6.4 10*3/uL (ref 4.0–10.5)

## 2014-12-25 LAB — PROTIME-INR
INR: 1.64 — AB (ref 0.00–1.49)
Prothrombin Time: 19.5 seconds — ABNORMAL HIGH (ref 11.6–15.2)

## 2014-12-25 LAB — HEPARIN LEVEL (UNFRACTIONATED): Heparin Unfractionated: 0.28 IU/mL — ABNORMAL LOW (ref 0.30–0.70)

## 2014-12-25 SURGERY — ECHOCARDIOGRAM, TRANSESOPHAGEAL
Anesthesia: Moderate Sedation

## 2014-12-25 MED ORDER — HEPARIN (PORCINE) IN NACL 100-0.45 UNIT/ML-% IJ SOLN
1300.0000 [IU]/h | INTRAMUSCULAR | Status: DC
Start: 2014-12-25 — End: 2014-12-25
  Administered 2014-12-25: 1300 [IU]/h via INTRAVENOUS
  Filled 2014-12-25: qty 250

## 2014-12-25 MED ORDER — FENTANYL CITRATE (PF) 100 MCG/2ML IJ SOLN
INTRAMUSCULAR | Status: DC | PRN
  Administered 2014-12-25: 12.5 ug via INTRAVENOUS

## 2014-12-25 MED ORDER — SODIUM CHLORIDE 0.9 % IV SOLN: INTRAVENOUS | Status: DC

## 2014-12-25 MED ORDER — BUTAMBEN-TETRACAINE-BENZOCAINE 2-2-14 % EX AERO
INHALATION_SPRAY | CUTANEOUS | Status: DC | PRN
  Administered 2014-12-25: 2 via TOPICAL

## 2014-12-25 MED ORDER — MORPHINE SULFATE (PF) 2 MG/ML IV SOLN
2.0000 mg | INTRAVENOUS | Status: DC | PRN
  Administered 2014-12-26 – 2014-12-29 (×6): 2 mg via INTRAVENOUS
  Filled 2014-12-25 (×8): qty 1

## 2014-12-25 MED ORDER — CEFAZOLIN SODIUM-DEXTROSE 2-3 GM-% IV SOLR
2.0000 g | Freq: Three times a day (TID) | INTRAVENOUS | Status: DC
  Administered 2014-12-25 – 2014-12-30 (×16): 2 g via INTRAVENOUS
  Filled 2014-12-25 (×17): qty 50

## 2014-12-25 MED ORDER — MIDAZOLAM HCL 10 MG/2ML IJ SOLN
INTRAMUSCULAR | Status: DC | PRN
  Administered 2014-12-25 (×2): 1 mg via INTRAVENOUS

## 2014-12-25 MED ORDER — HEPARIN (PORCINE) IN NACL 100-0.45 UNIT/ML-% IJ SOLN
1450.0000 [IU]/h | INTRAMUSCULAR | Status: DC
  Administered 2014-12-25 – 2014-12-26 (×2): 1450 [IU]/h via INTRAVENOUS
  Filled 2014-12-25 (×3): qty 250

## 2014-12-25 NOTE — Progress Notes (Signed)
ANTICOAGULATION CONSULT NOTE - Initial Consult  Pharmacy Consult for Heparin IV Indication: Prosthetic aortic valve replacement, DVT, Afib  Allergies  Allergen Reactions  . Demerol [Meperidine] Other (See Comments)    "hallucinations"    Patient Measurements: Height: 5\' 9"  (175.3 cm) Weight: 209 lb (94.802 kg) IBW/kg (Calculated) : 70.7 Heparin Dosing Weight: 90 kg  Vital Signs: Temp: 97.7 F (36.5 C) (10/27 0454) Temp Source: Oral (10/27 0454) BP: 112/63 mmHg (10/27 0454) Pulse Rate: 59 (10/27 0454)  Labs:  Recent Labs  12/23/14 0648 12/23/14 1156 12/24/14 0407 12/25/14 0515  HGB 12.7*  --  12.1* 12.0*  HCT 39.7  --  38.1* 37.7*  PLT 132*  --  121* 99*  APTT >200*  --   --   --   LABPROT 40.7*  --  24.5* 19.5*  INR 4.38*  --  2.23* 1.64*  CREATININE 1.73* 1.85* 1.85* 1.54*    Estimated Creatinine Clearance: 39.1 mL/min (by C-G formula based on Cr of 1.54).   Medical History: Past Medical History  Diagnosis Date  . PVD (peripheral vascular disease) ([place])   . Permanent atrial fibrillation ([place])   . DVT (deep venous thrombosis) ([place])   . Coronary artery disease   . Aortic stenosis   . GERD (gastroesophageal reflux disease)   . BPH (benign prostatic hypertrophy)   . Kidney stones   . Diverticulosis   . Shortness of breath   . Diabetes mellitus without complication ([place])     type 2  . 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 Gout   . Dermatophytosis of nail   . Gastroesophageal reflux disease   . PAH (pulmonary artery hypertension) ([place])     PAS 63 on ECHO 09/2012    Medications:  Scheduled:  . cefTRIAXone (ROCEPHIN)  IV  2 g Intravenous Q24H  . insulin aspart  0-9 Units Subcutaneous TID WC  . sodium chloride  3 mL Intravenous Q12H  . vancomycin  1,250 mg Intravenous Q24H   Infusions:  . sodium chloride 75 mL/hr at 12/24/14 2120    Assessment: 22 yoM  presented to ED on 10/24 with groin pain and headache.  PMH includes CAD s/p CABG, symptomatic bradycardia s/p pacemaker, CKD, DM2, DVT,  bioprosthetic aortic valve, and afib on chronic warfarin.  CT head no evidence of acute infarction, mass lesion, or intra- or extra-axial hemorrhage.  Warfarin has been placed on hold for possible lumbar puncture.  Pharmacy is consulted to dose IV heparin when INR is < 2.0  Home warfarin dose reported as 2.5mg  daily (last dose taken 10/23) with supratherapeutic INR 4.38 on admission.  Today, 12/25/2014: INR 1.64, decreased to subtherapeutic CBC: Hgb low/stable at 12, and Plt decreased to 99 SCr improved to 1.54 with CrCl ~ 39   Goal of Therapy:  Heparin level 0.3-0.7 units/ml Monitor platelets by anticoagulation protocol: Yes   Plan:   No heparin bolus  Start heparin IV infusion at 1300 units/hr  Heparin level 8 hours after starting  Daily heparin level and CBC  Follow up plan to resume warfarin if LP is no longer indicated.  Gretta Arab PharmD, BCPS Pager [phone] 12/25/2014 7:15 AM

## 2014-12-25 NOTE — Progress Notes (Signed)
ANTICOAGULATION CONSULT NOTE - Follow Up  Pharmacy Consult for Heparin IV Indication: Prosthetic aortic valve replacement, DVT, Afib  Labs:  Recent Labs  12/23/14 0648 12/23/14 1156 12/24/14 0407 12/25/14 0515 12/25/14 1530  HGB 12.7*  --  12.1* 12.0*  --   HCT 39.7  --  38.1* 37.7*  --   PLT 132*  --  121* 99*  --   APTT >200*  --   --   --   --   LABPROT 40.7*  --  24.5* 19.5*  --   INR 4.38*  --  2.23* 1.64*  --   HEPARINUNFRC  --   --   --   --  0.28*  CREATININE 1.73* 1.85* 1.85* 1.54*  --     Estimated Creatinine Clearance: 39.1 mL/min (by C-G formula based on Cr of 1.54).  Infusions:  . sodium chloride 75 mL/hr at 12/25/14 1258  . heparin 1,300 Units/hr (12/25/14 0804)    Assessment: See pharmacist note from earlier today for further detail.  Following this evening for first heparin level after start of infusion this morning.  Heparin level slightly subtherapeutic with infusion at 1300 units/hr.  Goal of Therapy:  Heparin level 0.3-0.7 units/ml Monitor platelets by anticoagulation protocol: Yes   Plan:   Increase heparin infusion to 1450 units/hr  Heparin level 8 hours from rate change  Daily heparin level and CBC  Follow up plan to resume warfarin if LP is no longer indicated.  [name], PharmD, BCPS Pager: [phone] 12/25/2014 4:55 PM

## 2014-12-25 NOTE — Progress Notes (Signed)
  Echocardiogram Echocardiogram Transesophageal has been performed.  Darlina Sicilian M 12/25/2014, 11:54 AM

## 2014-12-25 NOTE — Clinical Documentation Improvement (Signed)
Internal Medicine Critical Care  A cause and effect relationship may not be assumed and must be documented by a provider.  Please clarify the relationship, if any, between the patient's sepsis this admission and positive blood cultures growing staph aureaus.  Are the conditions:   Due to or associated with each other (Sepsis due to staph aureus)  Unrelated to each other  Other  Clinically Undetermined  Please exercise your independent, professional judgment when responding. A specific answer is not anticipated or expected.  Thank you, Mateo Flow, RN [phone] Clinical Documentation Specialist

## 2014-12-25 NOTE — CV Procedure (Signed)
TEE Brief Note  EF >55% Mild MR, moderate TR.  No vegetations noted on the RV lead  Bioprosthetic aortic valve well-seated. No vegetations noted and trivial AR. Unable to perform transgastric views due to resistance at the GE junction.  For further details see full report.  Tarence Searcy C. Oval Linsey, MD 12/25/2014 10:35 AM

## 2014-12-25 NOTE — Interval H&P Note (Signed)
History and Physical Interval Note:  12/25/2014 10:01 AM  Byard H Gervasi  has presented today for surgery, with the diagnosis of bacteremia     The various methods of treatment have been discussed with the patient and family. After consideration of risks, benefits and other options for treatment, the patient has consented to  Procedure(s): TRANSESOPHAGEAL ECHOCARDIOGRAM (TEE)   (inpatient at [facility]) (N/A) as a surgical intervention .  The patient's history has been reviewed, patient examined, no change in status, stable for surgery.  I have reviewed the patient's chart and labs.  Questions were answered to the patient's satisfaction.     Sharol Harness, MD 12/25/2014 10:01 AM

## 2014-12-25 NOTE — Progress Notes (Signed)
ANTIBIOTIC CONSULT NOTE - INITIAL  Pharmacy Consult for Cefazolin Indication: Bacteremia  Allergies  Allergen Reactions  . Demerol [Meperidine] Other (See Comments)    "hallucinations"    Patient Measurements: Height: 5\' 9"  (175.3 cm) Weight: 209 lb (94.802 kg) IBW/kg (Calculated) : 70.7  Vital Signs: Temp: 97.7 F (36.5 C) (10/27 1218) Temp Source: Oral (10/27 1218) BP: 165/78 mmHg (10/27 1218) Pulse Rate: 63 (10/27 1218) Intake/Output from previous day: 10/26 0701 - 10/27 0700 In: 2178.8 [P.O.:120; I.V.:1758.8; IV Piggyback:300] Out: 800 [Urine:800]  Labs:  Recent Labs  12/23/14 0648 12/23/14 1156 12/24/14 0407 12/25/14 0515  WBC 10.4  --  9.6 6.4  HGB 12.7*  --  12.1* 12.0*  PLT 132*  --  121* 99*  CREATININE 1.73* 1.85* 1.85* 1.54*   Estimated Creatinine Clearance: 39.1 mL/min (by C-G formula based on Cr of 1.54). No results for input(s): VANCOTROUGH, VANCOPEAK, VANCORANDOM, GENTTROUGH, GENTPEAK, GENTRANDOM, TOBRATROUGH, TOBRAPEAK, TOBRARND, AMIKACINPEAK, AMIKACINTROU, AMIKACIN in the last 72 hours.   Microbiology: Recent Results (from the past 720 hour(s))  Culture, blood (routine x 2)     Status: None   Collection Time: 12/22/14 10:45 PM  Result Value Ref Range Status   Specimen Description BLOOD RIGHT ARM  Final   Special Requests BOTTLES DRAWN AEROBIC AND ANAEROBIC 5ML  Final   Culture  Setup Time   Final    GRAM POSITIVE COCCI IN CLUSTERS IN BOTH AEROBIC AND ANAEROBIC BOTTLES CRITICAL RESULT CALLED TO, READ BACK BY AND VERIFIED WITH: Sonnie Alamo RN 1840 12/23/14 A BROWNING    Culture   Final    STAPHYLOCOCCUS AUREUS SUSCEPTIBILITIES PERFORMED ON PREVIOUS CULTURE WITHIN THE LAST 5 DAYS. Performed at [facility]    Report Status 12/25/2014 FINAL  Final  Culture, blood (routine x 2)     Status: None   Collection Time: 12/22/14 11:50 PM  Result Value Ref Range Status   Specimen Description BLOOD LEFT WRIST  Final   Special Requests BOTTLES  DRAWN AEROBIC AND ANAEROBIC 5ML  Final   Culture  Setup Time   Final    GRAM POSITIVE COCCI IN CLUSTERS IN BOTH AEROBIC AND ANAEROBIC BOTTLES CRITICAL RESULT CALLED TO, READ BACK BY AND VERIFIED WITH: S ARMSTRONG RN 2029 12/23/14 A BROWNING    Culture   Final    STAPHYLOCOCCUS AUREUS Performed at [facility]    Report Status 12/25/2014 FINAL  Final   Organism ID, Bacteria STAPHYLOCOCCUS AUREUS  Final      Susceptibility   Staphylococcus aureus - MIC*    CIPROFLOXACIN <=0.5 SENSITIVE Sensitive     ERYTHROMYCIN <=0.25 SENSITIVE Sensitive     GENTAMICIN <=0.5 SENSITIVE Sensitive     OXACILLIN 0.5 SENSITIVE Sensitive     TETRACYCLINE <=1 SENSITIVE Sensitive     VANCOMYCIN 1 SENSITIVE Sensitive     TRIMETH/SULFA <=10 SENSITIVE Sensitive     CLINDAMYCIN <=0.25 SENSITIVE Sensitive     RIFAMPIN <=0.5 SENSITIVE Sensitive     Inducible Clindamycin NEGATIVE Sensitive     * STAPHYLOCOCCUS AUREUS  Urine culture     Status: None   Collection Time: 12/23/14  2:08 AM  Result Value Ref Range Status   Specimen Description URINE, RANDOM  Final   Special Requests NONE  Final   Culture   Final    NO GROWTH 1 DAY Performed at [facility]    Report Status 12/24/2014 FINAL  Final  MRSA PCR Screening     Status: None   Collection  Time: 12/23/14 10:37 AM  Result Value Ref Range Status   MRSA by PCR NEGATIVE NEGATIVE Final    Comment:        The GeneXpert MRSA Assay (FDA approved for NASAL specimens only), is one component of a comprehensive MRSA colonization surveillance program. It is not intended to diagnose MRSA infection nor to guide or monitor treatment for MRSA infections.     Assessment: 3 yoM presents 10/24 with severe left groin pain, headache, fever. Initially started on antimicrobial regimen for rule out meningitis. CCM now following and reports no no meningeal signs on exam or concern for meningitis /encephalitis. Patient with SIRS with no clear source of  infection. Pharmacy was initially consulted for empiric vancomycin and ceftriaxone for r/o sepsis of unknown etiology, but now narrowed to Cefazolin alone for MSSA bacteremia.  10/25 >>zosyn >> x1  10/25 >> vancomycin >> 10/27 10/25 >> Rocephin >>10/27 10/25 >> Ampicillin x 1 10/25 >> Acyclovir x 1 10/27 >> Cefazolin >>  Today, 12/25/2014:  Afebrile  WBC 6.4  SCr decreased to 1.54 with CrCl ~ 39    S/p TEE:  No vegetations noted   Blood cultures   Repeat blood cultures pending.  Goal of Therapy:  Appropriate abx dosing, eradication of infection.   Plan:   Cefazolin 2g IV q8h.  Follow up renal fxn, culture results, and clinical course.  Gretta Arab PharmD, BCPS Pager [phone] 12/25/2014 3:30 PM

## 2014-12-25 NOTE — Progress Notes (Signed)
Pt being transported to [facility] for TEE via [facility], telemetry removed, IV heparin infusing at 58ml/hr

## 2014-12-25 NOTE — Progress Notes (Signed)
Pt returned to room from [facility] via [facility], VVS, telemetry applied, Heparin infusing

## 2014-12-25 NOTE — Progress Notes (Signed)
[facility] PROGRESS NOTE  Carron Brazen Anagnos PPJ:[id number] DOB: [birth year] DOA: 12/22/2014 PCP: Wenda Low, MD  Assessment/Plan: 1. Sepsis -Present on admission, evidenced by a temperature of 104.8, with respiratory rate of 38, blood pressures of 83/40, lactic acid of 2.7, having positive blood cultures. -Workup this far has included abdominal and pelvis CT scan that did not reveal acute intra-abdominal pathology. Radiology reported market elevation of the left hemi-diaphragm with displacement of the stomach head and proximal body of the pancreas. No evidence of acute infection. Scrotal ultrasound did not show evidence for testicular torsion. -Patient undergoing transesophageal echocardiogram on 12/25/2014 which did not show evidence of vegetation. By prostatic valve well seated. -Blood cultures growing methicillin sensitive Staphylococcus aureus from both sets. -Case was discussed with infectious disease, his vancomycin was discontinued as he was started on cefazolin -Plan to repeat blood cultures. -Unclear source of infection, physical examination complains of significant hip pain that is bilateral. Will further workup with CT scan of pelvis and lumbar spine.    2.  History of aortic valve replacement with by prostatic valve -As mentioned above patient presenting with sepsis, having blood cultures positive for gram-positive cocci 2 sets. -Transesophageal echocardiogram performed on 12/25/2014 did not show evidence of vegetations. Study showed by prostatic aortic valve well-seated. -There is no evidence of acute CHF  3.  Type 2 diabetes mellitus -His blood sugars remain stable in the  85-107 range -Continue Accu-Cheks with sliding scale coverage  4.  Bilateral groin pain. -Patient complaining of pain over bilateral groin and inner thigh region. -On physical examination he did not appear to have bulging mass which would suggest hernia. He did have pain with internal rotation of his  head. I wonder about the possibility of symptoms being secondary to osteoarthritis. Other possibilities would include lumbar stenosis or bursitis although unlikely. -Will further workup with CT scan of pelvis along with lumbar spine.  5.  Hypertension. -Patient having a history of hypertension however since he presented with hypotension in setting of sepsis antihypertensive agents were discontinued Blood pressures remain stable.   6.  History of atrial fibrillation CHADSVasc score of 2 -He presented with a supratherapeutic INR 4.38 for which Coumadin has been held. -INR trended down to 2.23 on 12/24/2014. -Consulted pharmacy for Coumadin management.  Code Status: Full code Family Communication: Spoke with his wife who was present at bedside Disposition Plan: Will transfer to [facility]   Consultants:  Critical care medicine  Cardiology  Antibiotics:  Vancomycin  stopped on 12/25/2014   Ceftriaxone stopped on 12/25/2014  Cefazolin started on 12/25/2014  HPI/Subjective: Mr Murri is an 81 year old gentleman with a past medical history of coronary artery disease status post coronary artery bypass grafting, status post bioprosthetic aortic valve replacement, ace maker implant, chronic kidney disease who was admitted to medicine service on 12/23/2014. He presented with complaints of fever, chills, bilateral groin pain. On admission he had a temperature of 104.8. He was septic and started on broad-spectrum IV antimicrobial therapy with vancomycin and ceftriaxone. Workup included a CT scan of abdomen and pelvis with IV contrast which did not reveal acute intra-abdominal pathology. Scrotal ultrasound did not reveal evidence of testicular torsion. Given septic picture pulmonary critical care medicine was consulted.  Objective: Filed Vitals:   12/25/14 1218  BP: 165/78  Pulse: 63  Temp: 97.7 F (36.5 C)  Resp: 16    Intake/Output Summary (Last 24 hours) at 12/25/14 1744 Last data  filed at 12/25/14 0809  Gross per 24 hour  Intake  1581.75 ml  Output    800 ml  Net 781.75 ml   Filed Weights   12/22/14 2200 12/25/14 0855  Weight: 94.802 kg (209 lb) 94.802 kg (209 lb)    Exam:   General:  Patient is nontoxic appearing, awake and alert in no acute distress. He was assisted out of bed to chair  Cardiovascular: Regular rate and rhythm normal S1-S2 no murmurs rubs or gallops  Respiratory: Normal respiratory effort, lungs are clear to auscultation bilaterally  Abdomen: Soft nontender nontender positive bowel sounds  Musculoskeletal: Patient reporting pain to his hip with internal rotation of bilateral lower extremities  Data Reviewed: Basic Metabolic Panel:  Recent Labs Lab 12/22/14 2345 12/23/14 0648 12/23/14 1156 12/24/14 0407 12/25/14 0515  NA 140 139 141 138 137  K 3.9 3.8 3.8 3.8 3.9  CL 106 108 108 106 105  CO2 27 23 25 26 25   GLUCOSE 182* 202* 114* 123* 121*  BUN 27* 28* 29* 33* 33*  CREATININE 1.58* 1.73* 1.85* 1.85* 1.54*  CALCIUM 8.9 8.1* 8.3* 7.9* 8.0*  MG  --   --   --  1.8  --    Liver Function Tests:  Recent Labs Lab 12/22/14 2345 12/23/14 0648 12/23/14 1156 12/23/14 1818  AST 23 39 41 48*  ALT 13* 19 19 22   ALKPHOS 42 38 37* 30*  BILITOT 1.2 1.6* 1.3* 1.5*  PROT 7.2 6.4* 6.0* 5.9*  ALBUMIN 3.9 3.5 3.3* 3.1*   No results for input(s): LIPASE, AMYLASE in the last 168 hours. No results for input(s): AMMONIA in the last 168 hours. CBC:  Recent Labs Lab 12/22/14 2345 12/23/14 0648 12/24/14 0407 12/25/14 0515  WBC 9.6 10.4 9.6 6.4  NEUTROABS 8.5* 9.8*  --   --   HGB 13.5 12.7* 12.1* 12.0*  HCT 42.5 39.7 38.1* 37.7*  MCV 98.6 98.5 99.7 98.2  PLT 170 132* 121* 99*   Cardiac Enzymes: No results for input(s): CKTOTAL, CKMB, CKMBINDEX, TROPONINI in the last 168 hours. BNP (last 3 results)  Recent Labs  12/23/14 0336  BNP 306.2*    ProBNP (last 3 results) No results for input(s): PROBNP in the last 8760  hours.  CBG:  Recent Labs Lab 12/24/14 1714 12/24/14 2202 12/25/14 0731 12/25/14 1226 12/25/14 1732  GLUCAP 162* 137* 112* 85 107*    Recent Results (from the past 240 hour(s))  Culture, blood (routine x 2)     Status: None   Collection Time: 12/22/14 10:45 PM  Result Value Ref Range Status   Specimen Description BLOOD RIGHT ARM  Final   Special Requests BOTTLES DRAWN AEROBIC AND ANAEROBIC 5ML  Final   Culture  Setup Time   Final    GRAM POSITIVE COCCI IN CLUSTERS IN BOTH AEROBIC AND ANAEROBIC BOTTLES CRITICAL RESULT CALLED TO, READ BACK BY AND VERIFIED WITH: Sonnie Alamo RN 1840 12/23/14 A BROWNING    Culture   Final    STAPHYLOCOCCUS AUREUS SUSCEPTIBILITIES PERFORMED ON PREVIOUS CULTURE WITHIN THE LAST 5 DAYS. Performed at [facility]    Report Status 12/25/2014 FINAL  Final  Culture, blood (routine x 2)     Status: None   Collection Time: 12/22/14 11:50 PM  Result Value Ref Range Status   Specimen Description BLOOD LEFT WRIST  Final   Special Requests BOTTLES DRAWN AEROBIC AND ANAEROBIC 5ML  Final   Culture  Setup Time   Final    GRAM POSITIVE COCCI IN CLUSTERS IN BOTH AEROBIC AND ANAEROBIC BOTTLES CRITICAL  RESULT CALLED TO, READ BACK BY AND VERIFIED WITH: S ARMSTRONG RN 2029 12/23/14 A BROWNING    Culture   Final    STAPHYLOCOCCUS AUREUS Performed at [facility]    Report Status 12/25/2014 FINAL  Final   Organism ID, Bacteria STAPHYLOCOCCUS AUREUS  Final      Susceptibility   Staphylococcus aureus - MIC*    CIPROFLOXACIN <=0.5 SENSITIVE Sensitive     ERYTHROMYCIN <=0.25 SENSITIVE Sensitive     GENTAMICIN <=0.5 SENSITIVE Sensitive     OXACILLIN 0.5 SENSITIVE Sensitive     TETRACYCLINE <=1 SENSITIVE Sensitive     VANCOMYCIN 1 SENSITIVE Sensitive     TRIMETH/SULFA <=10 SENSITIVE Sensitive     CLINDAMYCIN <=0.25 SENSITIVE Sensitive     RIFAMPIN <=0.5 SENSITIVE Sensitive     Inducible Clindamycin NEGATIVE Sensitive     * STAPHYLOCOCCUS AUREUS   Urine culture     Status: None   Collection Time: 12/23/14  2:08 AM  Result Value Ref Range Status   Specimen Description URINE, RANDOM  Final   Special Requests NONE  Final   Culture   Final    NO GROWTH 1 DAY Performed at [facility]    Report Status 12/24/2014 FINAL  Final  MRSA PCR Screening     Status: None   Collection Time: 12/23/14 10:37 AM  Result Value Ref Range Status   MRSA by PCR NEGATIVE NEGATIVE Final    Comment:        The GeneXpert MRSA Assay (FDA approved for NASAL specimens only), is one component of a comprehensive MRSA colonization surveillance program. It is not intended to diagnose MRSA infection nor to guide or monitor treatment for MRSA infections.      Studies: Dg Chest Port 1 View  12/24/2014  CLINICAL DATA:  Pulmonary edema, history of previous CABG and aortic valve replacement EXAM: PORTABLE CHEST 1 VIEW COMPARISON:  Portable chest x-ray of December 23, 2014 FINDINGS: The cardiopericardial silhouette remains enlarged. The prosthetic aortic valve cage is visible. The left hemidiaphragm remains obscured. The pulmonary interstitial markings remain increased but are slightly less conspicuous today. Left lower lobe atelectasis or pneumonia is present and stable. Mild elevation of the left hemidiaphragm is suspected. The permanent pacemaker is unchanged in position. There are 8 intact sternal wires from previous median sternotomy. IMPRESSION: Slight interval improvement in pulmonary interstitial edema secondary to CHF. Persistent left lower lobe atelectasis and/or pneumonia. Electronically Signed   By: David  Martinique M.D.   On: 12/24/2014 07:08    Scheduled Meds: .  ceFAZolin (ANCEF) IV  2 g Intravenous 3 times per day  . insulin aspart  0-9 Units Subcutaneous TID WC  . sodium chloride  3 mL Intravenous Q12H   Continuous Infusions: . sodium chloride 75 mL/hr at 12/25/14 1258  . heparin 1,450 Units/hr (12/25/14 1711)    Principal Problem:    Sepsis ([place]) Active Problems:   Essential hypertension, benign   Cardiac pacemaker in situ   Fever, unspecified   Type 2 diabetes mellitus with vascular disease (HCC)   CKD (chronic kidney disease) stage 3, GFR 30-59 ml/min   Bilateral hip pain   Elevated lactic acid level   Pulmonary edema   Groin pain    Time spent: 35 min    Louden Houseworth  [facility] Pager [phone]. If 7PM-7AM, please contact night-coverage at [email or web address], password [facility] 12/25/2014, 5:44 PM  LOS: 2 days

## 2014-12-25 NOTE — Care Management Note (Signed)
Case Management Note  Patient Details  Name: KAM RAHIMI MRN: [id number] Date of Birth: May 26, 1925  Subjective/Objective:   PT-recc HHPT. [facility] chosen by patient, used them in the past. [facility] rep Kristen aware & following, await HHPT, f5f order.                 Action/Plan:d/c home w/HHC.   Expected Discharge Date:   (UNKNOWN)               Expected Discharge Plan:  [place]  In-House Referral:     Discharge planning Services  CM Consult  Post Acute Care Choice:    Choice offered to:  Patient  DME Arranged:    DME Agency:     HH Arranged:    [place] Agency:  [facility]  Status of Service:  In process, will continue to follow  Medicare Important Message Given:    Date Medicare IM Given:    Medicare IM give by:    Date Additional Medicare IM Given:    Additional Medicare Important Message give by:     If discussed at [place] of Stay Meetings, dates discussed:    Additional Comments:  Dessa Phi, RN 12/25/2014, 4:24 PM

## 2014-12-25 NOTE — H&P (View-Only) (Signed)
    [facility] has been requested to perform a transesophageal echocardiogram on [name]  for bacteremia in setting of aortic valve replacement (tissue valve - 2006).  After careful review of history and examination, the risks and benefits of transesophageal echocardiogram have been explained including risks of esophageal damage, perforation (1:10,000 risk), bleeding, pharyngeal hematoma as well as other potential complications associated with conscious sedation including aspiration, arrhythmia, respiratory failure and death. Alternatives to treatment were discussed, questions were answered. Patient is willing to proceed.   BP has been 89/44 - 144/67 in the past 24 hours. No requirement for pressors. Hgb stable at 12.1. Platelets at 121.  Procedure scheduled on 12/25/2014 with Dr. Oval Linsey at 1000. Will need [facility] to [facility] and needs to be at [facility] by 0830. Patient's nurse notified of this and will arrange transport with [place].  Erma Heritage, PA-C 12/24/2014 10:21 AM

## 2014-12-25 NOTE — Consult Note (Signed)
[name] for Infectious Disease       Reason for Consult: Staph aureus bacteremia    Referring Physician: Dr. Coralyn Pear  Principal Problem:   Sepsis [facility]) Active Problems:   Essential hypertension, benign   Cardiac pacemaker in situ   Fever, unspecified   Type 2 diabetes mellitus with vascular disease (HCC)   CKD (chronic kidney disease) stage 3, GFR 30-59 ml/min   Bilateral hip pain   Elevated lactic acid level   Pulmonary edema   Groin pain   .  ceFAZolin (ANCEF) IV  2 g Intravenous 3 times per day  . insulin aspart  0-9 Units Subcutaneous TID WC  . sodium chloride  3 mL Intravenous Q12H    Recommendations: Have narrowed to cefazolin Repeat blood cultures Imaging of hips/lumbar spine picc once blood cultures clear at 48 hours   Assessment: He has Staph aureus bacteremia with sepsis from unknown source in the setting of pacemaker and bioprosthetic valve.     Antibiotics: Vancomycin and ceftriaxone  HPI: [name] is a 81 y.o. male with history of CAD with CABG, bioprosthetic aortic valve, afib, bradycardia with pacemaker who presented with mainly a complaint of left > right groin pain.  He also was febrile with chills, fever to 104, normal WBC, lactate 1.8, then repeat lactate over 2 and blood cultures now with MSSA in 2/2 blood cultures.  CT of abd/pelvis done and no significant findings, Xray with no hip joint space arthropathy.  Had a TEE and no vegetation.   Xray of hip independently reviewed and no lytic type lesions.  TTE, TEE noted and no vegetation.   Review of Systems:  Constitutional: negative Musculoskeletal: positive for arthralgias All other systems reviewed and are negative   Past Medical History  Diagnosis Date  . PVD (peripheral vascular disease) ([place])   . Permanent atrial fibrillation ([place])   . DVT (deep venous thrombosis) ([place])   . Coronary artery disease   . Aortic stenosis   . GERD (gastroesophageal reflux disease)   . BPH  (benign prostatic hypertrophy)   . Kidney stones   . Diverticulosis   . Shortness of breath   . Diabetes mellitus without complication ([place])     type 2  . 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 Gout   . Dermatophytosis of nail   . Gastroesophageal reflux disease   . PAH (pulmonary artery hypertension) ([place])     PAS 63 on ECHO 09/2012    Social History  Substance Use Topics  . Smoking status: Never Smoker   . Smokeless tobacco: Never Used  . Alcohol Use: No    Family History  Problem Relation Age of Onset  . Cancer - Other Father     Allergies  Allergen Reactions  . Demerol [Meperidine] Other (See Comments)    "hallucinations"    Physical Exam: Constitutional: in no apparent distress and alert  Filed Vitals:   12/25/14 1218  BP: 165/78  Pulse: 63  Temp: 97.7 F (36.5 C)  Resp: 16   EYES: anicteric ENMT: Cardiovascular: Cor irreg, irreg RRR Respiratory: clear; GI: Bowel sounds are normal, liver is not enlarged, spleen is not enlarged Musculoskeletal: pedal edema 1-2 +, pain in left hip with leg raise Skin: negatives: color normal, no rash Hematologic: no cervical lad  Lab Results  Component Value Date   WBC 6.4 12/25/2014  HGB 12.0* 12/25/2014   HCT 37.7* 12/25/2014   MCV 98.2 12/25/2014   PLT 99* 12/25/2014    Lab Results  Component Value Date   CREATININE 1.54* 12/25/2014   BUN 33* 12/25/2014   NA 137 12/25/2014   K 3.9 12/25/2014   CL 105 12/25/2014   CO2 25 12/25/2014    Lab Results  Component Value Date   ALT 22 12/23/2014   AST 48* 12/23/2014   ALKPHOS 30* 12/23/2014     Microbiology: Recent Results (from the past 240 hour(s))  Culture, blood (routine x 2)     Status: None   Collection Time: 12/22/14 10:45 PM  Result Value Ref Range Status   Specimen Description BLOOD RIGHT ARM  Final   Special Requests BOTTLES DRAWN AEROBIC AND ANAEROBIC 5ML   Final   Culture  Setup Time   Final    GRAM POSITIVE COCCI IN CLUSTERS IN BOTH AEROBIC AND ANAEROBIC BOTTLES CRITICAL RESULT CALLED TO, READ BACK BY AND VERIFIED WITH: Sonnie Alamo RN 1840 12/23/14 A BROWNING    Culture   Final    STAPHYLOCOCCUS AUREUS SUSCEPTIBILITIES PERFORMED ON PREVIOUS CULTURE WITHIN THE LAST 5 DAYS. Performed at [facility]    Report Status 12/25/2014 FINAL  Final  Culture, blood (routine x 2)     Status: None   Collection Time: 12/22/14 11:50 PM  Result Value Ref Range Status   Specimen Description BLOOD LEFT WRIST  Final   Special Requests BOTTLES DRAWN AEROBIC AND ANAEROBIC 5ML  Final   Culture  Setup Time   Final    GRAM POSITIVE COCCI IN CLUSTERS IN BOTH AEROBIC AND ANAEROBIC BOTTLES CRITICAL RESULT CALLED TO, READ BACK BY AND VERIFIED WITH: S ARMSTRONG RN 2029 12/23/14 A BROWNING    Culture   Final    STAPHYLOCOCCUS AUREUS Performed at [facility]    Report Status 12/25/2014 FINAL  Final   Organism ID, Bacteria STAPHYLOCOCCUS AUREUS  Final      Susceptibility   Staphylococcus aureus - MIC*    CIPROFLOXACIN <=0.5 SENSITIVE Sensitive     ERYTHROMYCIN <=0.25 SENSITIVE Sensitive     GENTAMICIN <=0.5 SENSITIVE Sensitive     OXACILLIN 0.5 SENSITIVE Sensitive     TETRACYCLINE <=1 SENSITIVE Sensitive     VANCOMYCIN 1 SENSITIVE Sensitive     TRIMETH/SULFA <=10 SENSITIVE Sensitive     CLINDAMYCIN <=0.25 SENSITIVE Sensitive     RIFAMPIN <=0.5 SENSITIVE Sensitive     Inducible Clindamycin NEGATIVE Sensitive     * STAPHYLOCOCCUS AUREUS  Urine culture     Status: None   Collection Time: 12/23/14  2:08 AM  Result Value Ref Range Status   Specimen Description URINE, RANDOM  Final   Special Requests NONE  Final   Culture   Final    NO GROWTH 1 DAY Performed at [facility]    Report Status 12/24/2014 FINAL  Final  MRSA PCR Screening     Status: None   Collection Time: 12/23/14 10:37 AM  Result Value Ref Range Status   MRSA by PCR  NEGATIVE NEGATIVE Final    Comment:        The GeneXpert MRSA Assay (FDA approved for NASAL specimens only), is one component of a comprehensive MRSA colonization surveillance program. It is not intended to diagnose MRSA infection nor to guide or monitor treatment for MRSA infections.     Scharlene Gloss, [facility] for Infectious Disease [place] [email or web address] [phone] pager  [phone] cell 12/25/2014, 2:36 PM

## 2014-12-26 ENCOUNTER — Encounter (HOSPITAL_COMMUNITY): Payer: Self-pay | Admitting: Cardiovascular Disease

## 2014-12-26 DIAGNOSIS — M79604 Pain in right leg: Secondary | ICD-10-CM

## 2014-12-26 DIAGNOSIS — M25559 Pain in unspecified hip: Secondary | ICD-10-CM | POA: Insufficient documentation

## 2014-12-26 DIAGNOSIS — A4901 Methicillin susceptible Staphylococcus aureus infection, unspecified site: Secondary | ICD-10-CM

## 2014-12-26 DIAGNOSIS — M79605 Pain in left leg: Secondary | ICD-10-CM

## 2014-12-26 LAB — HEPARIN LEVEL (UNFRACTIONATED)
HEPARIN UNFRACTIONATED: 0.53 [IU]/mL (ref 0.30–0.70)
Heparin Unfractionated: 0.46 IU/mL (ref 0.30–0.70)

## 2014-12-26 LAB — GLUCOSE, CAPILLARY
GLUCOSE-CAPILLARY: 210 mg/dL — AB (ref 65–99)
GLUCOSE-CAPILLARY: 175 mg/dL — AB (ref 65–99)
Glucose-Capillary: 108 mg/dL — ABNORMAL HIGH (ref 65–99)
Glucose-Capillary: 112 mg/dL — ABNORMAL HIGH (ref 65–99)
Glucose-Capillary: 136 mg/dL — ABNORMAL HIGH (ref 65–99)

## 2014-12-26 LAB — CBC
HEMATOCRIT: 35.7 % — AB (ref 39.0–52.0)
Hemoglobin: 11.5 g/dL — ABNORMAL LOW (ref 13.0–17.0)
MCH: 31.3 pg (ref 26.0–34.0)
MCHC: 32.2 g/dL (ref 30.0–36.0)
MCV: 97 fL (ref 78.0–100.0)
PLATELETS: 99 10*3/uL — AB (ref 150–400)
RBC: 3.68 MIL/uL — ABNORMAL LOW (ref 4.22–5.81)
RDW: 14.2 % (ref 11.5–15.5)
WBC: 6.3 10*3/uL (ref 4.0–10.5)

## 2014-12-26 LAB — BASIC METABOLIC PANEL
Anion gap: 7 (ref 5–15)
BUN: 28 mg/dL — ABNORMAL HIGH (ref 6–20)
CALCIUM: 8 mg/dL — AB (ref 8.9–10.3)
CO2: 24 mmol/L (ref 22–32)
CREATININE: 1.29 mg/dL — AB (ref 0.61–1.24)
Chloride: 107 mmol/L (ref 101–111)
GFR calc Af Amer: 56 mL/min — ABNORMAL LOW (ref >60)
GFR, EST NON AFRICAN AMERICAN: 48 mL/min — AB (ref >60)
GLUCOSE: 132 mg/dL — AB (ref 65–99)
Potassium: 3.9 mmol/L (ref 3.5–5.1)
Sodium: 138 mmol/L (ref 135–145)

## 2014-12-26 LAB — PROTIME-INR
INR: 1.71 — AB (ref 0.00–1.49)
Prothrombin Time: 20 seconds — ABNORMAL HIGH (ref 11.6–15.2)

## 2014-12-26 LAB — CK: Total CK: 665 U/L — ABNORMAL HIGH (ref 49–397)

## 2014-12-26 MED ORDER — WARFARIN - PHARMACIST DOSING INPATIENT: Freq: Every day | Status: DC

## 2014-12-26 MED ORDER — WARFARIN SODIUM 2.5 MG PO TABS
2.5000 mg | ORAL_TABLET | Freq: Once | ORAL | Status: AC
  Administered 2014-12-26: 2.5 mg via ORAL
  Filled 2014-12-26: qty 1

## 2014-12-26 MED ORDER — PREDNISONE 50 MG PO TABS
60.0000 mg | ORAL_TABLET | Freq: Every day | ORAL | Status: DC
  Administered 2014-12-26 – 2014-12-28 (×3): 60 mg via ORAL
  Filled 2014-12-26 (×3): qty 1

## 2014-12-26 NOTE — Progress Notes (Signed)
ANTICOAGULATION CONSULT NOTE - Follow up Jud for Heparin IV, Warfarin Indication: Prosthetic aortic valve replacement, DVT, Afib  Allergies  Allergen Reactions  . Demerol [Meperidine] Other (See Comments)    "hallucinations"    Patient Measurements: Height: 5\' 9"  (175.3 cm) Weight: 209 lb (94.802 kg) IBW/kg (Calculated) : 70.7 Heparin Dosing Weight: 90 kg  Vital Signs: Temp: 97.9 F (36.6 C) (10/28 0458) Temp Source: Oral (10/28 0458) BP: 140/78 mmHg (10/28 0458) Pulse Rate: 59 (10/28 0458)  Labs:  Recent Labs  12/24/14 0407 12/25/14 0515 12/25/14 1530 12/26/14 0130  HGB 12.1* 12.0*  --  11.5*  HCT 38.1* 37.7*  --  35.7*  PLT 121* 99*  --  99*  LABPROT 24.5* 19.5*  --  20.0*  INR 2.23* 1.64*  --  1.71*  HEPARINUNFRC  --   --  0.28* 0.53  CREATININE 1.85* 1.54*  --  1.29*    Estimated Creatinine Clearance: 46.7 mL/min (by C-G formula based on Cr of 1.29).  Medications:  Infusions:  . sodium chloride 75 mL/hr at 12/26/14 0210  . heparin 1,450 Units/hr (12/25/14 2254)    Assessment: 38 yoM presented to ED on 10/24 with groin pain and headache.  PMH includes CAD s/p CABG, symptomatic bradycardia s/p pacemaker, CKD, DM2, DVT,  bioprosthetic aortic valve, and afib on chronic warfarin.  CT head no evidence of acute infarction, mass lesion, or intra- or extra-axial hemorrhage.  Warfarin has been placed on hold for possible lumbar puncture.  Pharmacy is consulted to dose IV heparin while INR < 2 and resume warfarin dosing on 10/28.  Home warfarin dose reported as 2.5mg  daily (last dose taken 10/23).    INR was 1.9 at [facility] visit on 10/24, but was supratherapeutic with INR 4.38 on admission 10/25.  He has been therapeutic (or very near therapeutic) with stated home dose of warfarin 2.5mg  daily with regular visits to [facility]madin Clinic since prior to 01/2014.  Supratherapeutic INR on admission may have been d/t acute infection.  Today,  12/26/2014:  INR 1.71, subtherapeutic  Heparin level 0.46, remains therapeutic (Heparin level drawn late, lab attempted draw x3 and was able to obtain on last attempt)  CBC: Hgb low/stable at 11.5, and Plt remain low/stable at 88  Rn reports no bleeding or complications.  SCr improved to 1.29 with CrCl ~ 46  No major drug-drug interactions noted.   Goal of Therapy:  INR 2-3 Heparin level 0.3-0.7 units/ml Monitor platelets by anticoagulation protocol: Yes   Plan:   Warfarin 2.5mg  PO today at 1800  Continue heparin IV infusion at 1450 units/hr  Daily INR, heparin level, and CBC   Gretta Arab PharmD, BCPS Pager [phone] 12/26/2014 12:47 PM

## 2014-12-26 NOTE — Progress Notes (Signed)
[facility] PROGRESS NOTE  [name] HCW:[id number] DOB: April 29, 1925 DOA: 12/22/2014 PCP: Wenda Low, MD  Assessment/Plan: 1. Sepsis -Present on admission, evidenced by a temperature of 104.8, with respiratory rate of 38, blood pressures of 83/40, lactic acid of 2.7, having positive blood cultures. -Workup this far has included abdominal and pelvis CT scan that did not reveal acute intra-abdominal pathology. Radiology reported market elevation of the left hemi-diaphragm with displacement of the stomach head and proximal body of the pancreas. No evidence of acute infection. Scrotal ultrasound did not show evidence for testicular torsion. -Patient undergoing transesophageal echocardiogram on 12/25/2014 which did not show evidence of vegetation. By prostatic valve well seated. -Blood cultures growing methicillin sensitive Staphylococcus aureus from both sets. -Case was discussed with infectious disease, his vancomycin was discontinued as he was started on cefazolin -Blood cultures were repeated on 12/25/2014 -He was further worked up with a CT scan of pelvis and lumbar spine which did not show evidence of acute infectious process.  -Case discussed with infectious disease who recommended 4 weeks of IV antibiotic therapy with cefazolin. Repeat blood cultures were obtained on 12/25/2014. If the remain negative plan to place PICC line on 12/29/2014  2.  History of aortic valve replacement with by prostatic valve -As mentioned above patient presenting with sepsis, having blood cultures positive for gram-positive cocci 2 sets. -Transesophageal echocardiogram performed on 12/25/2014 did not show evidence of vegetations. Study showed by prostatic aortic valve well-seated. -There is no evidence of acute CHF. He does not appear to have acute cardiac pulmonary issues.  3.  Type 2 diabetes mellitus -His blood sugars remain stable in the  85-107 range -Continue Accu-Cheks with sliding scale  coverage  4.  Bilateral groin pain. -Patient complaining of pain over bilateral groin and inner thigh region. -On physical examination he did not appear to have bulging mass which would suggest hernia. He did have pain with internal rotation of his leg. I wonder about the possibility of symptoms being secondary to osteoarthritis. Other possibilities would include lumbar stenosis or bursitis although unlikely. -CT scan of pelvis and lumbar spine performed on 12/25/2014. He was found to have multilevel osseous arthritic changes of the lower lumbar sacral spine. There was no evidence of fracture or subluxation of bilateral hips or lumbosacral spine.   5.  Hypertension. -Patient having a history of hypertension however since he presented with hypotension in setting of sepsis antihypertensive agents were discontinued Blood pressures remain stable.   6.  History of atrial fibrillation CHADSVasc score of 3 -He presented with a supratherapeutic INR 4.38 for which Coumadin has been held. -INR trended down to 2.23 on 12/24/2014. -Consulted pharmacy for Coumadin management.  Code Status: Full code Family Communication: Spoke with his wife who was present at bedside Disposition Plan: Continue IV antibiotic therapy over the weekend, plan for PICC line placement on Monday if repeat blood cultures remain negative   Consultants:  Critical care medicine  Cardiology  Infectious disease  Antibiotics:  Vancomycin  stopped on 12/25/2014   Ceftriaxone stopped on 12/25/2014  Cefazolin started on 12/25/2014  HPI/Subjective: [name] is an 81 year old gentleman with a past medical history of coronary artery disease status post coronary artery bypass grafting, status post bioprosthetic aortic valve replacement, ace maker implant, chronic kidney disease who was admitted to medicine service on 12/23/2014. He presented with complaints of fever, chills, bilateral groin pain. On admission he had a  temperature of 104.8. He was septic and started on broad-spectrum IV antimicrobial therapy  with vancomycin and ceftriaxone. Workup included a CT scan of abdomen and pelvis with IV contrast which did not reveal acute intra-abdominal pathology. Scrotal ultrasound did not reveal evidence of testicular torsion. Given septic picture pulmonary critical care medicine was consulted.  Objective: Filed Vitals:   12/26/14 1359  BP: 107/52  Pulse: 59  Temp: 98.4 F (36.9 C)  Resp: 18    Intake/Output Summary (Last 24 hours) at 12/26/14 1448 Last data filed at 12/26/14 1359  Gross per 24 hour  Intake 2076.61 ml  Output   1300 ml  Net 776.61 ml   Filed Weights   12/22/14 2200 12/25/14 0855  Weight: 94.802 kg (209 lb) 94.802 kg (209 lb)    Exam:   General:  Patient is nontoxic appearing, awake and alert in no acute distress. He was assisted out of bed to chair  Cardiovascular: Regular rate and rhythm normal S1-S2 no murmurs rubs or gallops  Respiratory: Normal respiratory effort, lungs are clear to auscultation bilaterally  Abdomen: Soft nontender nontender positive bowel sounds  Musculoskeletal: Patient reporting pain to his hip with internal rotation of bilateral lower extremities  Data Reviewed: Basic Metabolic Panel:  Recent Labs Lab 12/23/14 0648 12/23/14 1156 12/24/14 0407 12/25/14 0515 12/26/14 0130  NA 139 141 138 137 138  K 3.8 3.8 3.8 3.9 3.9  CL 108 108 106 105 107  CO2 23 25 26 25 24   GLUCOSE 202* 114* 123* 121* 132*  BUN 28* 29* 33* 33* 28*  CREATININE 1.73* 1.85* 1.85* 1.54* 1.29*  CALCIUM 8.1* 8.3* 7.9* 8.0* 8.0*  MG  --   --  1.8  --   --    Liver Function Tests:  Recent Labs Lab 12/22/14 2345 12/23/14 0648 12/23/14 1156 12/23/14 1818  AST 23 39 41 48*  ALT 13* 19 19 22   ALKPHOS 42 38 37* 30*  BILITOT 1.2 1.6* 1.3* 1.5*  PROT 7.2 6.4* 6.0* 5.9*  ALBUMIN 3.9 3.5 3.3* 3.1*   No results for input(s): LIPASE, AMYLASE in the last 168 hours. No  results for input(s): AMMONIA in the last 168 hours. CBC:  Recent Labs Lab 12/22/14 2345 12/23/14 0648 12/24/14 0407 12/25/14 0515 12/26/14 0130  WBC 9.6 10.4 9.6 6.4 6.3  NEUTROABS 8.5* 9.8*  --   --   --   HGB 13.5 12.7* 12.1* 12.0* 11.5*  HCT 42.5 39.7 38.1* 37.7* 35.7*  MCV 98.6 98.5 99.7 98.2 97.0  PLT 170 132* 121* 99* 99*   Cardiac Enzymes:  Recent Labs Lab 12/26/14 1120  CKTOTAL 665*   BNP (last 3 results)  Recent Labs  12/23/14 0336  BNP 306.2*    ProBNP (last 3 results) No results for input(s): PROBNP in the last 8760 hours.  CBG:  Recent Labs Lab 12/25/14 1226 12/25/14 1732 12/25/14 2153 12/26/14 0748 12/26/14 1209  GLUCAP 85 107* 108* 112* 136*    Recent Results (from the past 240 hour(s))  Culture, blood (routine x 2)     Status: None   Collection Time: 12/22/14 10:45 PM  Result Value Ref Range Status   Specimen Description BLOOD RIGHT ARM  Final   Special Requests BOTTLES DRAWN AEROBIC AND ANAEROBIC 5ML  Final   Culture  Setup Time   Final    GRAM POSITIVE COCCI IN CLUSTERS IN BOTH AEROBIC AND ANAEROBIC BOTTLES CRITICAL RESULT CALLED TO, READ BACK BY AND VERIFIED WITH: Sonnie Alamo RN 1840 12/23/14 A BROWNING    Culture   Final    STAPHYLOCOCCUS  AUREUS SUSCEPTIBILITIES PERFORMED ON PREVIOUS CULTURE WITHIN THE LAST 5 DAYS. Performed at [facility]    Report Status 12/25/2014 FINAL  Final  Culture, blood (routine x 2)     Status: None   Collection Time: 12/22/14 11:50 PM  Result Value Ref Range Status   Specimen Description BLOOD LEFT WRIST  Final   Special Requests BOTTLES DRAWN AEROBIC AND ANAEROBIC 5ML  Final   Culture  Setup Time   Final    GRAM POSITIVE COCCI IN CLUSTERS IN BOTH AEROBIC AND ANAEROBIC BOTTLES CRITICAL RESULT CALLED TO, READ BACK BY AND VERIFIED WITH: S ARMSTRONG RN 2029 12/23/14 A BROWNING    Culture   Final    STAPHYLOCOCCUS AUREUS Performed at [facility]    Report Status 12/25/2014 FINAL   Final   Organism ID, Bacteria STAPHYLOCOCCUS AUREUS  Final      Susceptibility   Staphylococcus aureus - MIC*    CIPROFLOXACIN <=0.5 SENSITIVE Sensitive     ERYTHROMYCIN <=0.25 SENSITIVE Sensitive     GENTAMICIN <=0.5 SENSITIVE Sensitive     OXACILLIN 0.5 SENSITIVE Sensitive     TETRACYCLINE <=1 SENSITIVE Sensitive     VANCOMYCIN 1 SENSITIVE Sensitive     TRIMETH/SULFA <=10 SENSITIVE Sensitive     CLINDAMYCIN <=0.25 SENSITIVE Sensitive     RIFAMPIN <=0.5 SENSITIVE Sensitive     Inducible Clindamycin NEGATIVE Sensitive     * STAPHYLOCOCCUS AUREUS  Urine culture     Status: None   Collection Time: 12/23/14  2:08 AM  Result Value Ref Range Status   Specimen Description URINE, RANDOM  Final   Special Requests NONE  Final   Culture   Final    NO GROWTH 1 DAY Performed at [facility]    Report Status 12/24/2014 FINAL  Final  MRSA PCR Screening     Status: None   Collection Time: 12/23/14 10:37 AM  Result Value Ref Range Status   MRSA by PCR NEGATIVE NEGATIVE Final    Comment:        The GeneXpert MRSA Assay (FDA approved for NASAL specimens only), is one component of a comprehensive MRSA colonization surveillance program. It is not intended to diagnose MRSA infection nor to guide or monitor treatment for MRSA infections.   Culture, blood (routine x 2)     Status: None (Preliminary result)   Collection Time: 12/25/14  3:30 PM  Result Value Ref Range Status   Specimen Description BLOOD LEFT ARM  Final   Special Requests BOTTLES DRAWN AEROBIC AND ANAEROBIC  5CC  Final   Culture   Final    NO GROWTH < 24 HOURS Performed at [facility]    Report Status PENDING  Incomplete  Culture, blood (routine x 2)     Status: None (Preliminary result)   Collection Time: 12/25/14  3:45 PM  Result Value Ref Range Status   Specimen Description BLOOD LEFT HAND  Final   Special Requests BOTTLES DRAWN AEROBIC ONLY  8CC  Final   Culture   Final    NO GROWTH < 24  HOURS Performed at [facility]    Report Status PENDING  Incomplete     Studies: Ct Lumbar Spine Wo Contrast  12/25/2014  CLINICAL DATA:  81 year old patient with sepsis, who complains of bilateral groin and inner thigh pain. EXAM: CT LUMBAR SPINE WITHOUT CONTRAST CT PELVIS WITHOUT CONTRAST TECHNIQUE: Multidetector CT imaging of the lumbar spine was performed without intravenous contrast administration. Multiplanar CT image  reconstructions were also generated. COMPARISON:  None. FINDINGS: PELVIS: Urinary bladder has normal appearance. The prostate gland is mildly enlarged, abutting the posterior wall of the urinary bladder. The visualized portion of small and large bowel loops have normal caliber and distribution. There is no evidence of lymphadenopathy, free fluid or free gas within the pelvis. Evaluation of the osseous structures demonstrates no evidence of fracture, dislocation or significant effusion of the hip joints. There is mild synovial thickening of the left hip joint, without CT evidence of an effusion. Mild osteoarthritic changes are seen bilaterally. Evaluation of the lumbosacral spine demonstrates no evidence of fracture or subluxation. Findings of diffuse idiopathic skeletal hyperostosis a seen in the lower thoracic spine. There are moderate multilevel osteoarthritic changes of the lumbosacral spine. There is levoconvex scoliosis. Ankylosing of right-sided osteophytes are noted at the levels of T12-L1, L1-L2 and L2-L3. Posterior facet arthropathy is also seen in the lower lumbosacral spine. There is mild narrowing of the bony neural foramina bilaterally at L5-S1. Deformity of right twelfth rib is likely due to healed fracture. IMPRESSION: No evidence of fracture or subluxation of bilateral hips, or lumbosacral spine. No evidence of sizable hip joint effusions. Multilevel osteoarthritic changes of the lower lumbosacral spine. Prominence of the prostate gland. Correlation to serum PSA  may be considered. Electronically Signed   By: Fidela Salisbury M.D.   On: 12/25/2014 18:18   Ct Pelvis Wo Contrast  12/25/2014  CLINICAL DATA:  81 year old patient with sepsis, who complains of bilateral groin and inner thigh pain. EXAM: CT LUMBAR SPINE WITHOUT CONTRAST CT PELVIS WITHOUT CONTRAST TECHNIQUE: Multidetector CT imaging of the lumbar spine was performed without intravenous contrast administration. Multiplanar CT image reconstructions were also generated. COMPARISON:  None. FINDINGS: PELVIS: Urinary bladder has normal appearance. The prostate gland is mildly enlarged, abutting the posterior wall of the urinary bladder. The visualized portion of small and large bowel loops have normal caliber and distribution. There is no evidence of lymphadenopathy, free fluid or free gas within the pelvis. Evaluation of the osseous structures demonstrates no evidence of fracture, dislocation or significant effusion of the hip joints. There is mild synovial thickening of the left hip joint, without CT evidence of an effusion. Mild osteoarthritic changes are seen bilaterally. Evaluation of the lumbosacral spine demonstrates no evidence of fracture or subluxation. Findings of diffuse idiopathic skeletal hyperostosis a seen in the lower thoracic spine. There are moderate multilevel osteoarthritic changes of the lumbosacral spine. There is levoconvex scoliosis. Ankylosing of right-sided osteophytes are noted at the levels of T12-L1, L1-L2 and L2-L3. Posterior facet arthropathy is also seen in the lower lumbosacral spine. There is mild narrowing of the bony neural foramina bilaterally at L5-S1. Deformity of right twelfth rib is likely due to healed fracture. IMPRESSION: No evidence of fracture or subluxation of bilateral hips, or lumbosacral spine. No evidence of sizable hip joint effusions. Multilevel osteoarthritic changes of the lower lumbosacral spine. Prominence of the prostate gland. Correlation to serum PSA may  be considered. Electronically Signed   By: Fidela Salisbury M.D.   On: 12/25/2014 18:18    Scheduled Meds: .  ceFAZolin (ANCEF) IV  2 g Intravenous 3 times per day  . insulin aspart  0-9 Units Subcutaneous TID WC  . predniSONE  60 mg Oral Q breakfast  . sodium chloride  3 mL Intravenous Q12H  . warfarin  2.5 mg Oral ONCE-1800  . Warfarin - Pharmacist Dosing Inpatient   Does not apply q1800   Continuous Infusions: .  sodium chloride 75 mL/hr at 12/26/14 0210  . heparin 1,450 Units/hr (12/25/14 2254)    Principal Problem:   Sepsis ([place]) Active Problems:   Essential hypertension, benign   Cardiac pacemaker in situ   Fever, unspecified   Type 2 diabetes mellitus with vascular disease ([place])   CKD (chronic kidney disease) stage 3, GFR 30-59 ml/min   Bilateral hip pain   Elevated lactic acid level   Pulmonary edema   Groin pain    Time spent: 35 min    Sequoyah Ramone  [facility] Pager [phone]. If 7PM-7AM, please contact night-coverage at [email or web address], password [facility] 12/26/2014, 2:48 PM  LOS: 3 days

## 2014-12-26 NOTE — Progress Notes (Signed)
[facility]  Patient Status:   New pt for [facility] this admission  [facility] is providing the following services: HHRN, PT and [facility]fusion Pharm[facility] for home IV ABX. [facility] hospital team will follow pt to support transition to home.    If patient discharges after hours, please call [phone].   [name] 12/26/2014, 1:44 PM

## 2014-12-26 NOTE — Progress Notes (Signed)
Physical Therapy Treatment Patient Details Name: LEEVON [name] MRN: [id number] DOB: December 09, 1925 Today's Date: 12/26/2014    History of Present Illness 81 year old male with a past medical history of coronary artery disease status post coronary artery bypass grafting, status post bioprosthetic aortic valve replacement, pacemaker implant, chronic kidney disease and admitted with c/o bilateral groin pain, sepsis     PT Comments    Pt requiring increased +2 assist for sit to stands and able to tolerate only 8 feet of ambulation today due to bilateral groin pain.  Pain limiting mobility at this time so pt may need SNF if pain persists.  Follow Up Recommendations  Home health PT;Supervision/Assistance - 24 hour (may need SNF if pain persists)     Equipment Recommendations  None recommended by PT    Recommendations for Other Services       Precautions / Restrictions Precautions Precautions: Fall Precaution Comments: L AFO    Mobility  Bed Mobility               General bed mobility comments: pt up in recliner on arrival  Transfers Overall transfer level: Needs assistance Equipment used: Rolling walker (2 wheeled) Transfers: Sit to/from Stand Sit to Stand: +2 physical assistance;Max assist         General transfer comment: pt required mod assist to rise and control descent due to bilateral groin pain, pt able to lift feet from floor/march in place so initiated gait  Ambulation/Gait Ambulation/Gait assistance: Mod assist Ambulation Distance (Feet): 8 Feet Assistive device: Rolling walker (2 wheeled) Gait Pattern/deviations: Step-through pattern;Decreased stride length;Trunk flexed     General Gait Details: difficulty advancing LEs during gait due to pain, limited distance due to pain, recliner following   Stairs            Wheelchair Mobility    Modified Rankin (Stroke Patients Only)       Balance                                     Cognition Arousal/Alertness: Awake/alert Behavior During Therapy: WFL for tasks assessed/performed Overall Cognitive Status: Impaired/Different from baseline Area of Impairment: Orientation Orientation Level: Disoriented to;Place             General Comments: pt thought he was at home, seems a little more confused today compared to last visit    Exercises      General Comments        Pertinent Vitals/Pain Pain Assessment: 0-10 Pain Score: 10-Worst pain ever Pain Location: bilateral groin with mobility Pain Descriptors / Indicators: Aching Pain Intervention(s): Limited activity within patient's tolerance;Monitored during session (RN aware, assisted with gait)    Home Living                      Prior Function            PT Goals (current goals can now be found in the care plan section) Progress towards PT goals: Progressing toward goals    Frequency  Min 3X/week    PT Plan Current plan remains appropriate    Co-evaluation             End of Session Equipment Utilized During Treatment: Gait belt Activity Tolerance: Patient limited by pain Patient left: in chair;with call bell/phone within reach;with chair alarm set     Time: 3016-0109 PT Time Calculation (min) (ACUTE ONLY): 18  min  Charges:  $Gait Training: 8-22 mins                    G Codes:      [name],[name] 01/11/15, 1:36 PM [name], PT, DPT 01-11-15 Pager: [phone]

## 2014-12-26 NOTE — Progress Notes (Signed)
ANTICOAGULATION CONSULT NOTE - Follow Up Consult  Pharmacy Consult for Heparin Indication: Prosthetic aortic valve replacement, DVT, Afib  Allergies  Allergen Reactions  . Demerol [Meperidine] Other (See Comments)    "hallucinations"    Patient Measurements: Height: 5\' 9"  (175.3 cm) Weight: 209 lb (94.802 kg) IBW/kg (Calculated) : 70.7 Heparin Dosing Weight:   Vital Signs: Temp: 97.9 F (36.6 C) (10/28 0458) Temp Source: Oral (10/28 0458) BP: 140/78 mmHg (10/28 0458) Pulse Rate: 59 (10/28 0458)  Labs:  Recent Labs  12/23/14 0648  12/24/14 0407 12/25/14 0515 12/25/14 1530 12/26/14 0130  HGB 12.7*  --  12.1* 12.0*  --  11.5*  HCT 39.7  --  38.1* 37.7*  --  35.7*  PLT 132*  --  121* 99*  --  99*  APTT >200*  --   --   --   --   --   LABPROT 40.7*  --  24.5* 19.5*  --  20.0*  INR 4.38*  --  2.23* 1.64*  --  1.71*  HEPARINUNFRC  --   --   --   --  0.28* 0.53  CREATININE 1.73*  < > 1.85* 1.54*  --  1.29*  < > = values in this interval not displayed.  Estimated Creatinine Clearance: 46.7 mL/min (by C-G formula based on Cr of 1.29).   Medications:  Infusions:  . sodium chloride 75 mL/hr at 12/26/14 0210  . heparin 1,450 Units/hr (12/25/14 2254)    Assessment: Patient with heparin level at goal.  No heparin issues noted per RN.  Goal of Therapy:  Heparin level 0.3-0.7 units/ml Monitor platelets by anticoagulation protocol: Yes   Plan:  Continue heparin drip at current rate Recheck level at 0900  Tyler Deis, Shea Stakes [place] 12/26/2014,6:33 AM

## 2014-12-26 NOTE — Progress Notes (Signed)
[place] for Infectious Disease   Date of Admission:  12/22/2014  Antibiotics: cefazolin  Interval history: Still with bilateral leg pain.  Afebrile.  CT without specific abnormalities. CT personally reviewed.    Objective:  CONSTITUTIONAL:in no apparent distress  Filed Vitals:   12/26/14 0458  BP: 140/78  Pulse: 59  Temp: 97.9 F (36.6 C)  Resp: 16  Eyes: anicteric Cardiovascular: RRR, no leg edema Respiratory: CTA B GI: soft, nt, nd Skin: no rashes    A comprehensive review of systems was negative except for: Constitutional: positive for malaise Musculoskeletal: positive for bilateral leg pain persists  Lab Results Lab Results  Component Value Date   WBC 6.3 12/26/2014   HGB 11.5* 12/26/2014   HCT 35.7* 12/26/2014   MCV 97.0 12/26/2014   PLT 99* 12/26/2014    Lab Results  Component Value Date   CREATININE 1.29* 12/26/2014   BUN 28* 12/26/2014   NA 138 12/26/2014   K 3.9 12/26/2014   CL 107 12/26/2014   CO2 24 12/26/2014    Lab Results  Component Value Date   ALT 22 12/23/2014   AST 48* 12/23/2014   ALKPHOS 30* 12/23/2014   BILITOT 1.5* 12/23/2014      Microbiology: Recent Results (from the past 240 hour(s))  Culture, blood (routine x 2)     Status: None   Collection Time: 12/22/14 10:45 PM  Result Value Ref Range Status   Specimen Description BLOOD RIGHT ARM  Final   Special Requests BOTTLES DRAWN AEROBIC AND ANAEROBIC 5ML  Final   Culture  Setup Time   Final    GRAM POSITIVE COCCI IN CLUSTERS IN BOTH AEROBIC AND ANAEROBIC BOTTLES CRITICAL RESULT CALLED TO, READ BACK BY AND VERIFIED WITH: Sonnie Alamo RN 1840 12/23/14 A BROWNING    Culture   Final    STAPHYLOCOCCUS AUREUS SUSCEPTIBILITIES PERFORMED ON PREVIOUS CULTURE WITHIN THE LAST 5 DAYS. Performed at [facility]    Report Status 12/25/2014 FINAL  Final  Culture, blood (routine x 2)     Status: None   Collection Time: 12/22/14 11:50 PM  Result Value Ref Range Status   Specimen Description BLOOD LEFT WRIST  Final   Special Requests BOTTLES DRAWN AEROBIC AND ANAEROBIC 5ML  Final   Culture  Setup Time   Final    GRAM POSITIVE COCCI IN CLUSTERS IN BOTH AEROBIC AND ANAEROBIC BOTTLES CRITICAL RESULT CALLED TO, READ BACK BY AND VERIFIED WITH: S ARMSTRONG RN 2029 12/23/14 A BROWNING    Culture   Final    STAPHYLOCOCCUS AUREUS Performed at [facility]    Report Status 12/25/2014 FINAL  Final   Organism ID, Bacteria STAPHYLOCOCCUS AUREUS  Final      Susceptibility   Staphylococcus aureus - MIC*    CIPROFLOXACIN <=0.5 SENSITIVE Sensitive     ERYTHROMYCIN <=0.25 SENSITIVE Sensitive     GENTAMICIN <=0.5 SENSITIVE Sensitive     OXACILLIN 0.5 SENSITIVE Sensitive     TETRACYCLINE <=1 SENSITIVE Sensitive     VANCOMYCIN 1 SENSITIVE Sensitive     TRIMETH/SULFA <=10 SENSITIVE Sensitive     CLINDAMYCIN <=0.25 SENSITIVE Sensitive     RIFAMPIN <=0.5 SENSITIVE Sensitive     Inducible Clindamycin NEGATIVE Sensitive     * STAPHYLOCOCCUS AUREUS  Urine culture     Status: None   Collection Time: 12/23/14  2:08 AM  Result Value Ref Range Status   Specimen Description URINE, RANDOM  Final   Special Requests NONE  Final   Culture   Final    NO GROWTH 1 DAY Performed at [facility]    Report Status 12/24/2014 FINAL  Final  MRSA PCR Screening     Status: None   Collection Time: 12/23/14 10:37 AM  Result Value Ref Range Status   MRSA by PCR NEGATIVE NEGATIVE Final    Comment:        The GeneXpert MRSA Assay (FDA approved for NASAL specimens only), is one component of a comprehensive MRSA colonization surveillance program. It is not intended to diagnose MRSA infection nor to guide or monitor treatment for MRSA infections.   Culture, blood (routine x 2)     Status: None (Preliminary result)   Collection Time: 12/25/14  3:45 PM  Result Value Ref Range Status   Specimen Description BLOOD LEFT HAND  Final   Special Requests BOTTLES DRAWN AEROBIC  ONLY  8CC  Final   Culture PENDING  Incomplete   Report Status PENDING  Incomplete    Studies/Results: Ct Lumbar Spine Wo Contrast  12/25/2014  CLINICAL DATA:  81 year old patient with sepsis, who complains of bilateral groin and inner thigh pain. EXAM: CT LUMBAR SPINE WITHOUT CONTRAST CT PELVIS WITHOUT CONTRAST TECHNIQUE: Multidetector CT imaging of the lumbar spine was performed without intravenous contrast administration. Multiplanar CT image reconstructions were also generated. COMPARISON:  None. FINDINGS: PELVIS: Urinary bladder has normal appearance. The prostate gland is mildly enlarged, abutting the posterior wall of the urinary bladder. The visualized portion of small and large bowel loops have normal caliber and distribution. There is no evidence of lymphadenopathy, free fluid or free gas within the pelvis. Evaluation of the osseous structures demonstrates no evidence of fracture, dislocation or significant effusion of the hip joints. There is mild synovial thickening of the left hip joint, without CT evidence of an effusion. Mild osteoarthritic changes are seen bilaterally. Evaluation of the lumbosacral spine demonstrates no evidence of fracture or subluxation. Findings of diffuse idiopathic skeletal hyperostosis a seen in the lower thoracic spine. There are moderate multilevel osteoarthritic changes of the lumbosacral spine. There is levoconvex scoliosis. Ankylosing of right-sided osteophytes are noted at the levels of T12-L1, L1-L2 and L2-L3. Posterior facet arthropathy is also seen in the lower lumbosacral spine. There is mild narrowing of the bony neural foramina bilaterally at L5-S1. Deformity of right twelfth rib is likely due to healed fracture. IMPRESSION: No evidence of fracture or subluxation of bilateral hips, or lumbosacral spine. No evidence of sizable hip joint effusions. Multilevel osteoarthritic changes of the lower lumbosacral spine. Prominence of the prostate gland. Correlation  to serum PSA may be considered. Electronically Signed   By: Fidela Salisbury M.D.   On: 12/25/2014 18:18   Ct Pelvis Wo Contrast  12/25/2014  CLINICAL DATA:  81 year old patient with sepsis, who complains of bilateral groin and inner thigh pain. EXAM: CT LUMBAR SPINE WITHOUT CONTRAST CT PELVIS WITHOUT CONTRAST TECHNIQUE: Multidetector CT imaging of the lumbar spine was performed without intravenous contrast administration. Multiplanar CT image reconstructions were also generated. COMPARISON:  None. FINDINGS: PELVIS: Urinary bladder has normal appearance. The prostate gland is mildly enlarged, abutting the posterior wall of the urinary bladder. The visualized portion of small and large bowel loops have normal caliber and distribution. There is no evidence of lymphadenopathy, free fluid or free gas within the pelvis. Evaluation of the osseous structures demonstrates no evidence of fracture, dislocation or significant effusion of the hip joints. There is mild synovial thickening of the left hip joint,  without CT evidence of an effusion. Mild osteoarthritic changes are seen bilaterally. Evaluation of the lumbosacral spine demonstrates no evidence of fracture or subluxation. Findings of diffuse idiopathic skeletal hyperostosis a seen in the lower thoracic spine. There are moderate multilevel osteoarthritic changes of the lumbosacral spine. There is levoconvex scoliosis. Ankylosing of right-sided osteophytes are noted at the levels of T12-L1, L1-L2 and L2-L3. Posterior facet arthropathy is also seen in the lower lumbosacral spine. There is mild narrowing of the bony neural foramina bilaterally at L5-S1. Deformity of right twelfth rib is likely due to healed fracture. IMPRESSION: No evidence of fracture or subluxation of bilateral hips, or lumbosacral spine. No evidence of sizable hip joint effusions. Multilevel osteoarthritic changes of the lower lumbosacral spine. Prominence of the prostate gland. Correlation to  serum PSA may be considered. Electronically Signed   By: Fidela Salisbury M.D.   On: 12/25/2014 18:18    Assessment/Plan:  1) Staph aurues bacteremia - repeat cultures sent.  TEE without vegetation. Has Prosthetic valve.  MSSA.  Recommend 4 weeks of treatment if repeat cultures remain negative.  PICC line on Monday ok if cultures remain negative.    2) leg pain - unknown etiology.  CK elevated but does not seem warm or tender in the legs.  Trial of steroids.    Dr. Johnnye Sima is available over the weekend if needed, otherwise I will follow up on Monday. thanks  Scharlene Gloss, [place] for Infectious Disease [place] [email or web address] [phone] pager   [phone] cell 12/26/2014, 12:48 PM

## 2014-12-26 NOTE — Care Management Important Message (Signed)
Important Message  Patient Details  Name: [name] MRN: [id number] Date of Birth: 26-Dec-1925   Medicare Important Message Given:  Yes-second notification given    Shelda Altes 12/26/2014, 2:43 [place] Message  Patient Details  Name: [name] MRN: [id number] Date of Birth: [birth year]   Medicare Important Message Given:  Yes-second notification given    Shelda Altes 12/26/2014, 2:43 PM

## 2014-12-27 LAB — GLUCOSE, CAPILLARY
GLUCOSE-CAPILLARY: 162 mg/dL — AB (ref 65–99)
GLUCOSE-CAPILLARY: 167 mg/dL — AB (ref 65–99)
GLUCOSE-CAPILLARY: 213 mg/dL — AB (ref 65–99)
Glucose-Capillary: 284 mg/dL — ABNORMAL HIGH (ref 65–99)

## 2014-12-27 LAB — CBC
HEMATOCRIT: 46.9 % (ref 39.0–52.0)
HEMOGLOBIN: 15.3 g/dL (ref 13.0–17.0)
MCH: 31.7 pg (ref 26.0–34.0)
MCHC: 32.6 g/dL (ref 30.0–36.0)
MCV: 97.3 fL (ref 78.0–100.0)
Platelets: 87 10*3/uL — ABNORMAL LOW (ref 150–400)
RBC: 4.82 MIL/uL (ref 4.22–5.81)
RDW: 14.1 % (ref 11.5–15.5)
WBC: 3.9 10*3/uL — AB (ref 4.0–10.5)

## 2014-12-27 LAB — BASIC METABOLIC PANEL
ANION GAP: 11 (ref 5–15)
BUN: 24 mg/dL — ABNORMAL HIGH (ref 6–20)
CO2: 20 mmol/L — ABNORMAL LOW (ref 22–32)
Calcium: 8.5 mg/dL — ABNORMAL LOW (ref 8.9–10.3)
Chloride: 107 mmol/L (ref 101–111)
Creatinine, Ser: 1.23 mg/dL (ref 0.61–1.24)
GFR, EST AFRICAN AMERICAN: 59 mL/min — AB (ref >60)
GFR, EST NON AFRICAN AMERICAN: 51 mL/min — AB (ref >60)
GLUCOSE: 189 mg/dL — AB (ref 65–99)
POTASSIUM: 4.6 mmol/L (ref 3.5–5.1)
Sodium: 138 mmol/L (ref 135–145)

## 2014-12-27 LAB — HEPARIN LEVEL (UNFRACTIONATED)
HEPARIN UNFRACTIONATED: 1.06 [IU]/mL — AB (ref 0.30–0.70)
Heparin Unfractionated: 0.83 IU/mL — ABNORMAL HIGH (ref 0.30–0.70)

## 2014-12-27 LAB — PROTIME-INR
INR: 1.49 (ref 0.00–1.49)
Prothrombin Time: 18.1 seconds — ABNORMAL HIGH (ref 11.6–15.2)

## 2014-12-27 MED ORDER — HEPARIN (PORCINE) IN NACL 100-0.45 UNIT/ML-% IJ SOLN
1300.0000 [IU]/h | INTRAMUSCULAR | Status: DC
  Administered 2014-12-27: 1300 [IU]/h via INTRAVENOUS
  Filled 2014-12-27 (×2): qty 250

## 2014-12-27 MED ORDER — HEPARIN (PORCINE) IN NACL 100-0.45 UNIT/ML-% IJ SOLN
1050.0000 [IU]/h | INTRAMUSCULAR | Status: DC
  Administered 2014-12-27 – 2014-12-29 (×3): 1050 [IU]/h via INTRAVENOUS
  Filled 2014-12-27 (×3): qty 250

## 2014-12-27 MED ORDER — WARFARIN SODIUM 5 MG PO TABS
5.0000 mg | ORAL_TABLET | Freq: Once | ORAL | Status: AC
  Administered 2014-12-27: 5 mg via ORAL
  Filled 2014-12-27 (×2): qty 1

## 2014-12-27 NOTE — Progress Notes (Signed)
Pharmacy - Heparin IV  Assessment:  See earlier note from [name], PharmD for full details.  Briefly, 38 yoM on IV heparin for bridging after holding warfarin for procedure.  Heparin held x 1 hr this afternoon with supratherapeutic level.   Most recent heparin level improved but still supratherapeutic on 1300 units/hr  Note Plt continuing to trend down  Level appears to have been drawn appropriately  Plan: Decrease IV heparin to 1050 units/hr Recheck heparin level with AM labs F/u Plt trend  Reuel Boom, PharmD, BCPS Pager: [phone] 12/27/2014, 6:49 PM

## 2014-12-27 NOTE — Progress Notes (Signed)
[facility] PROGRESS NOTE  Carron Brazen [place] JGG:[id number] DOB: 10-07-1925 DOA: 12/22/2014 PCP: Wenda Low, MD  Assessment/Plan: 1. Sepsis -Present on admission, evidenced by a temperature of 104.8, with respiratory rate of 38, blood pressures of 83/40, lactic acid of 2.7, having positive blood cultures. -Workup this far has included abdominal and pelvis CT scan that did not reveal acute intra-abdominal pathology. Radiology reported market elevation of the left hemi-diaphragm with displacement of the stomach head and proximal body of the pancreas. No evidence of acute infection. Scrotal ultrasound did not show evidence for testicular torsion. -Patient undergoing transesophageal echocardiogram on 12/25/2014 which did not show evidence of vegetation. By prostatic valve well seated. -Blood cultures growing methicillin sensitive Staphylococcus aureus from both sets. -Case was discussed with infectious disease, his vancomycin was discontinued as he was started on cefazolin -Blood cultures were repeated on 12/25/2014 -He was further worked up with a CT scan of pelvis and lumbar spine which did not show evidence of acute infectious process.  -Case discussed with infectious disease who recommended 4 weeks of IV antibiotic therapy with cefazolin. Repeat blood cultures were obtained on 12/25/2014. If the remain negative plan to place PICC line on 12/29/2014 -Patient remains hemodynamically stable, afebrile, tolerating by mouth intake.  2.  History of aortic valve replacement with by prostatic valve -As mentioned above patient presenting with sepsis, having blood cultures positive for gram-positive cocci 2 sets. -Transesophageal echocardiogram performed on 12/25/2014 did not show evidence of vegetations. Study showed by prostatic aortic valve well-seated. -There is no evidence of acute CHF. He does not appear to have acute cardiac pulmonary issues.  3.  Type 2 diabetes mellitus -His blood sugars  remain stable in the  85-107 range -Continue Accu-Cheks with sliding scale coverage  4.  Bilateral groin pain. -Patient complaining of pain over bilateral groin and inner thigh region. -On physical examination he did not appear to have bulging mass which would suggest hernia. He did have pain with internal rotation of his leg. I wonder about the possibility of symptoms being secondary to osteoarthritis. Other possibilities would include lumbar stenosis or bursitis although unlikely. -CT scan of pelvis and lumbar spine performed on 12/25/2014. He was found to have multilevel osseous arthritic changes of the lower lumbar sacral spine. There was no evidence of fracture or subluxation of bilateral hips or lumbosacral spine.  -Unclear where his thigh/groin pain is coming from, he does not appear to have pain with palpation over that region. Doubt this would be reflective of myositis. I started him on steroids on 12/26/2014, treated him with prednisone 60 mg by mouth daily. Today he thinks he may have some improvement.   5.  Hypertension. -Patient having a history of hypertension however since he presented with hypotension in setting of sepsis antihypertensive agents were discontinued Blood pressures remain stable.   6.  History of atrial fibrillation CHADSVasc score of 3 -He presented with a supratherapeutic INR 4.38 for which Coumadin has been held. -INR trended down to 2.23 on 12/24/2014. -Consulted pharmacy for Coumadin management.  Code Status: Full code Family Communication: Spoke with his wife who was present at bedside Disposition Plan: Continue IV antibiotic therapy over the weekend, plan for PICC line placement on Monday if repeat blood cultures remain negative   Consultants:  Critical care medicine  Cardiology  Infectious disease  Antibiotics:  Vancomycin  stopped on 12/25/2014   Ceftriaxone stopped on 12/25/2014  Cefazolin started on 12/25/2014  HPI/Subjective: [name]  is an 81 year old gentleman with a  past medical history of coronary artery disease status post coronary artery bypass grafting, status post bioprosthetic aortic valve replacement, ace maker implant, chronic kidney disease who was admitted to medicine service on 12/23/2014. He presented with complaints of fever, chills, bilateral groin pain. On admission he had a temperature of 104.8. He was septic and started on broad-spectrum IV antimicrobial therapy with vancomycin and ceftriaxone. Workup included a CT scan of abdomen and pelvis with IV contrast which did not reveal acute intra-abdominal pathology. Scrotal ultrasound did not reveal evidence of testicular torsion. Given septic picture pulmonary critical care medicine was consulted.  Objective: Filed Vitals:   12/27/14 1423  BP: 122/64  Pulse: 62  Temp: 97.8 F (36.6 C)  Resp: 18    Intake/Output Summary (Last 24 hours) at 12/27/14 1747 Last data filed at 12/27/14 1500  Gross per 24 hour  Intake   1130 ml  Output   1050 ml  Net     80 ml   Filed Weights   12/22/14 2200 12/25/14 0855  Weight: 94.802 kg (209 lb) 94.802 kg (209 lb)    Exam:   General:  Patient is nontoxic appearing, awake and alert in no acute distress. He was assisted out of bed to chair  Cardiovascular: Regular rate and rhythm normal S1-S2 no murmurs rubs or gallops  Respiratory: Normal respiratory effort, lungs are clear to auscultation bilaterally  Abdomen: Soft nontender nontender positive bowel sounds  Musculoskeletal: He continues to have pain with flexion and extension of his head bilaterally. Although I think there is some interval improvement to his left hip region.  Data Reviewed: Basic Metabolic Panel:  Recent Labs Lab 12/23/14 1156 12/24/14 0407 12/25/14 0515 12/26/14 0130 12/27/14 0642  NA 141 138 137 138 138  K 3.8 3.8 3.9 3.9 4.6  CL 108 106 105 107 107  CO2 25 26 25 24  20*  GLUCOSE 114* 123* 121* 132* 189*  BUN 29* 33* 33* 28* 24*   CREATININE 1.85* 1.85* 1.54* 1.29* 1.23  CALCIUM 8.3* 7.9* 8.0* 8.0* 8.5*  MG  --  1.8  --   --   --    Liver Function Tests:  Recent Labs Lab 12/22/14 2345 12/23/14 0648 12/23/14 1156 12/23/14 1818  AST 23 39 41 48*  ALT 13* 19 19 22   ALKPHOS 42 38 37* 30*  BILITOT 1.2 1.6* 1.3* 1.5*  PROT 7.2 6.4* 6.0* 5.9*  ALBUMIN 3.9 3.5 3.3* 3.1*   No results for input(s): LIPASE, AMYLASE in the last 168 hours. No results for input(s): AMMONIA in the last 168 hours. CBC:  Recent Labs Lab 12/22/14 2345 12/23/14 0648 12/24/14 0407 12/25/14 0515 12/26/14 0130 12/27/14 0642  WBC 9.6 10.4 9.6 6.4 6.3 3.9*  NEUTROABS 8.5* 9.8*  --   --   --   --   HGB 13.5 12.7* 12.1* 12.0* 11.5* 15.3  HCT 42.5 39.7 38.1* 37.7* 35.7* 46.9  MCV 98.6 98.5 99.7 98.2 97.0 97.3  PLT 170 132* 121* 99* 99* 87*   Cardiac Enzymes:  Recent Labs Lab 12/26/14 1120  CKTOTAL 665*   BNP (last 3 results)  Recent Labs  12/23/14 0336  BNP 306.2*    ProBNP (last 3 results) No results for input(s): PROBNP in the last 8760 hours.  CBG:  Recent Labs Lab 12/26/14 1650 12/26/14 2146 12/27/14 0733 12/27/14 1148 12/27/14 1716  GLUCAP 210* 175* 162* 167* 213*    Recent Results (from the past 240 hour(s))  Culture, blood (routine x 2)  Status: None   Collection Time: 12/22/14 10:45 PM  Result Value Ref Range Status   Specimen Description BLOOD RIGHT ARM  Final   Special Requests BOTTLES DRAWN AEROBIC AND ANAEROBIC 5ML  Final   Culture  Setup Time   Final    GRAM POSITIVE COCCI IN CLUSTERS IN BOTH AEROBIC AND ANAEROBIC BOTTLES CRITICAL RESULT CALLED TO, READ BACK BY AND VERIFIED WITH: Sonnie Alamo RN 0737 12/23/14 A BROWNING    Culture   Final    STAPHYLOCOCCUS AUREUS SUSCEPTIBILITIES PERFORMED ON PREVIOUS CULTURE WITHIN THE LAST 5 DAYS. Performed at [facility]    Report Status 12/25/2014 FINAL  Final  Culture, blood (routine x 2)     Status: None   Collection Time: 12/22/14 11:50 PM   Result Value Ref Range Status   Specimen Description BLOOD LEFT WRIST  Final   Special Requests BOTTLES DRAWN AEROBIC AND ANAEROBIC 5ML  Final   Culture  Setup Time   Final    GRAM POSITIVE COCCI IN CLUSTERS IN BOTH AEROBIC AND ANAEROBIC BOTTLES CRITICAL RESULT CALLED TO, READ BACK BY AND VERIFIED WITH: S ARMSTRONG RN 2029 12/23/14 A BROWNING    Culture   Final    STAPHYLOCOCCUS AUREUS Performed at [facility]    Report Status 12/25/2014 FINAL  Final   Organism ID, Bacteria STAPHYLOCOCCUS AUREUS  Final      Susceptibility   Staphylococcus aureus - MIC*    CIPROFLOXACIN <=0.5 SENSITIVE Sensitive     ERYTHROMYCIN <=0.25 SENSITIVE Sensitive     GENTAMICIN <=0.5 SENSITIVE Sensitive     OXACILLIN 0.5 SENSITIVE Sensitive     TETRACYCLINE <=1 SENSITIVE Sensitive     VANCOMYCIN 1 SENSITIVE Sensitive     TRIMETH/SULFA <=10 SENSITIVE Sensitive     CLINDAMYCIN <=0.25 SENSITIVE Sensitive     RIFAMPIN <=0.5 SENSITIVE Sensitive     Inducible Clindamycin NEGATIVE Sensitive     * STAPHYLOCOCCUS AUREUS  Urine culture     Status: None   Collection Time: 12/23/14  2:08 AM  Result Value Ref Range Status   Specimen Description URINE, RANDOM  Final   Special Requests NONE  Final   Culture   Final    NO GROWTH 1 DAY Performed at [facility]    Report Status 12/24/2014 FINAL  Final  MRSA PCR Screening     Status: None   Collection Time: 12/23/14 10:37 AM  Result Value Ref Range Status   MRSA by PCR NEGATIVE NEGATIVE Final    Comment:        The GeneXpert MRSA Assay (FDA approved for NASAL specimens only), is one component of a comprehensive MRSA colonization surveillance program. It is not intended to diagnose MRSA infection nor to guide or monitor treatment for MRSA infections.   Culture, blood (routine x 2)     Status: None (Preliminary result)   Collection Time: 12/25/14  3:30 PM  Result Value Ref Range Status   Specimen Description BLOOD LEFT ARM  Final    Special Requests BOTTLES DRAWN AEROBIC AND ANAEROBIC  5CC  Final   Culture   Final    NO GROWTH 2 DAYS Performed at [facility]    Report Status PENDING  Incomplete  Culture, blood (routine x 2)     Status: None (Preliminary result)   Collection Time: 12/25/14  3:45 PM  Result Value Ref Range Status   Specimen Description BLOOD LEFT HAND  Final   Special Requests BOTTLES DRAWN AEROBIC ONLY  [place]  Final   Culture   Final    NO GROWTH 2 DAYS Performed at [facility]    Report Status PENDING  Incomplete     Studies: No results found.  Scheduled Meds: .  ceFAZolin (ANCEF) IV  2 g Intravenous 3 times per day  . insulin aspart  0-9 Units Subcutaneous TID WC  . predniSONE  60 mg Oral Q breakfast  . sodium chloride  3 mL Intravenous Q12H  . warfarin  5 mg Oral ONCE-1800  . Warfarin - Pharmacist Dosing Inpatient   Does not apply q1800   Continuous Infusions: . sodium chloride 75 mL/hr at 12/27/14 1606  . heparin 1,300 Units/hr (12/27/14 1214)    Principal Problem:   Sepsis ([place]) Active Problems:   Essential hypertension, benign   Cardiac pacemaker in situ   Fever, unspecified   Type 2 diabetes mellitus with vascular disease (HCC)   CKD (chronic kidney disease) stage 3, GFR 30-59 ml/min   Bilateral hip pain   Elevated lactic acid level   Pulmonary edema   Groin pain   Hip pain    Time spent: 25 min    Kelvin Cellar  [facility] Pager [phone]. If 7PM-7AM, please contact night-coverage at [email or web address], password [facility] 12/27/2014, 5:47 PM  LOS: 4 days

## 2014-12-27 NOTE — Progress Notes (Signed)
ANTICOAGULATION CONSULT NOTE - Follow up [place] for Heparin IV, Warfarin Indication: Prosthetic aortic valve replacement, DVT, Afib  Allergies  Allergen Reactions  . Demerol [Meperidine] Other (See Comments)    "hallucinations"    Patient Measurements: Height: 5\' 9"  (175.3 cm) Weight: 209 lb (94.802 kg) IBW/kg (Calculated) : 70.7  Heparin Dosing Weight: 90 kg  Vital Signs: Temp: 97.6 F (36.4 C) (10/29 0551) Temp Source: Oral (10/29 0551) BP: 132/73 mmHg (10/29 0551) Pulse Rate: 58 (10/29 0551)  Labs:  Recent Labs  12/25/14 0515 12/25/14 1530 12/26/14 0130 12/26/14 1120 12/27/14 0642  HGB 12.0*  --  11.5*  --  15.3  HCT 37.7*  --  35.7*  --  46.9  PLT 99*  --  99*  --  87*  LABPROT 19.5*  --  20.0*  --  18.1*  INR 1.64*  --  1.71*  --  1.49  HEPARINUNFRC  --  0.28* 0.53 0.46  --   CREATININE 1.54*  --  1.29*  --  1.23  CKTOTAL  --   --   --  665*  --     Estimated Creatinine Clearance: 49 mL/min (by C-G formula based on Cr of 1.23).  Medications:  Infusions:  . sodium chloride 75 mL/hr at 12/27/14 0523  . heparin 1,450 Units/hr (12/26/14 1549)    Assessment: [name] presented to ED on 10/24 with groin pain and headache.  PMH includes CAD s/p CABG, symptomatic bradycardia s/p pacemaker, CKD, DM2, DVT,  bioprosthetic aortic valve, and afib on chronic warfarin.  CT head no evidence of acute infarction, mass lesion, or intra- or extra-axial hemorrhage.  Warfarin was been placed on hold for possible lumbar puncture.  Pharmacy consulted to dose IV heparin while INR < 2 and resume warfarin dosing on 10/28.  Home warfarin dose reported as 2.5mg  daily (last dose taken 10/23).    INR was 1.9 at [facility]madin Clinic visit on 10/24, but was supratherapeutic with INR 4.38 on admission 10/25.  He has been therapeutic (or very near therapeutic) with stated home dose of warfarin 2.5mg  daily with regular visits to Coumadin Clinic since prior to 01/2014.   Supratherapeutic INR on admission may have been d/t acute infection.  Today, 12/27/2014:  INR 1.49, subtherapeutic  Heparin level 1.06, SUPRAtherapeutic on 1450 units/hr after being stable on this dose for 2 levels.  CBC: Hgb increased to 15.3, and Plt remain low at 87K today  Rn reports no bleeding or complications.  SCr improving, CrCl~49 ml/min  No major drug-drug interactions noted.  Goal of Therapy:  INR 2-3 Heparin level 0.3-0.7 units/ml Monitor platelets by anticoagulation protocol: Yes   Plan:   Hold heparin x 1 hour then resume heparin infusion at 1300 units/hr  Check heparin level 8 hours after resuming infusion  Warfarin 5mg  PO today at 1800 today with INR trending down then plan on resuming 2.5 mg daily dose tomorrow  Daily INR, heparin level, and CBC   [name], PharmD, BCPS Pager: [phone] 12/27/2014 8:31 AM

## 2014-12-28 LAB — GLUCOSE, CAPILLARY
GLUCOSE-CAPILLARY: 195 mg/dL — AB (ref 65–99)
Glucose-Capillary: 191 mg/dL — ABNORMAL HIGH (ref 65–99)
Glucose-Capillary: 200 mg/dL — ABNORMAL HIGH (ref 65–99)
Glucose-Capillary: 267 mg/dL — ABNORMAL HIGH (ref 65–99)

## 2014-12-28 LAB — HEPARIN LEVEL (UNFRACTIONATED): HEPARIN UNFRACTIONATED: 0.55 [IU]/mL (ref 0.30–0.70)

## 2014-12-28 LAB — COMPREHENSIVE METABOLIC PANEL
ALK PHOS: 37 U/L — AB (ref 38–126)
ALT: 13 U/L — AB (ref 17–63)
ANION GAP: 6 (ref 5–15)
AST: 27 U/L (ref 15–41)
Albumin: 3 g/dL — ABNORMAL LOW (ref 3.5–5.0)
BUN: 28 mg/dL — ABNORMAL HIGH (ref 6–20)
CALCIUM: 8.4 mg/dL — AB (ref 8.9–10.3)
CHLORIDE: 108 mmol/L (ref 101–111)
CO2: 24 mmol/L (ref 22–32)
CREATININE: 1.21 mg/dL (ref 0.61–1.24)
GFR, EST NON AFRICAN AMERICAN: 52 mL/min — AB (ref >60)
Glucose, Bld: 244 mg/dL — ABNORMAL HIGH (ref 65–99)
Potassium: 4.7 mmol/L (ref 3.5–5.1)
Sodium: 138 mmol/L (ref 135–145)
Total Bilirubin: 0.5 mg/dL (ref 0.3–1.2)
Total Protein: 6.1 g/dL — ABNORMAL LOW (ref 6.5–8.1)

## 2014-12-28 LAB — CBC
HCT: 37.1 % — ABNORMAL LOW (ref 39.0–52.0)
Hemoglobin: 12.3 g/dL — ABNORMAL LOW (ref 13.0–17.0)
MCH: 31.6 pg (ref 26.0–34.0)
MCHC: 33.2 g/dL (ref 30.0–36.0)
MCV: 95.4 fL (ref 78.0–100.0)
PLATELETS: 129 10*3/uL — AB (ref 150–400)
RBC: 3.89 MIL/uL — ABNORMAL LOW (ref 4.22–5.81)
RDW: 13.9 % (ref 11.5–15.5)
WBC: 5.8 10*3/uL (ref 4.0–10.5)

## 2014-12-28 LAB — PROTIME-INR
INR: 1.76 — ABNORMAL HIGH (ref 0.00–1.49)
PROTHROMBIN TIME: 20.5 s — AB (ref 11.6–15.2)

## 2014-12-28 LAB — CK: CK TOTAL: 315 U/L (ref 49–397)

## 2014-12-28 LAB — TSH: TSH: 4.767 u[IU]/mL — AB (ref 0.350–4.500)

## 2014-12-28 LAB — T4, FREE: FREE T4: 0.81 ng/dL (ref 0.61–1.12)

## 2014-12-28 MED ORDER — WARFARIN SODIUM 2.5 MG PO TABS
2.5000 mg | ORAL_TABLET | Freq: Once | ORAL | Status: AC
  Administered 2014-12-28: 2.5 mg via ORAL
  Filled 2014-12-28 (×2): qty 1

## 2014-12-28 MED ORDER — PREDNISONE 10 MG (21) PO TBPK: 10.0000 mg | ORAL_TABLET | Freq: Four times a day (QID) | ORAL | Status: DC

## 2014-12-28 MED ORDER — PREDNISONE 10 MG (21) PO TBPK: 20.0000 mg | ORAL_TABLET | Freq: Every evening | ORAL | Status: DC

## 2014-12-28 MED ORDER — PREDNISONE 10 MG (21) PO TBPK
20.0000 mg | ORAL_TABLET | Freq: Every evening | ORAL | Status: AC
  Administered 2014-12-29: 20 mg via ORAL

## 2014-12-28 MED ORDER — PREDNISONE 10 MG (21) PO TBPK
10.0000 mg | ORAL_TABLET | ORAL | Status: AC
  Administered 2014-12-29: 10 mg via ORAL

## 2014-12-28 MED ORDER — PREDNISONE 10 MG (21) PO TBPK
20.0000 mg | ORAL_TABLET | Freq: Every morning | ORAL | Status: AC
  Administered 2014-12-29: 20 mg via ORAL
  Filled 2014-12-28: qty 21

## 2014-12-28 MED ORDER — PREDNISONE 10 MG (21) PO TBPK
10.0000 mg | ORAL_TABLET | Freq: Three times a day (TID) | ORAL | Status: AC
  Administered 2014-12-30 (×3): 10 mg via ORAL

## 2014-12-28 NOTE — Progress Notes (Signed)
ANTIBIOTIC CONSULT NOTE - Follow Up  Pharmacy Consult for Cefazolin Indication: Bacteremia  Allergies  Allergen Reactions  . Demerol [Meperidine] Other (See Comments)    "hallucinations"    Patient Measurements: Height: 5\' 9"  (175.3 cm) Weight: 209 lb (94.802 kg) IBW/kg (Calculated) : 70.7  Vital Signs: Temp: 97.5 F (36.4 C) (10/30 0551) Temp Source: Oral (10/30 0551) BP: 152/80 mmHg (10/30 0551) Pulse Rate: 59 (10/30 0551) Intake/Output from previous day: 10/29 0701 - 10/30 0700 In: 1919.8 [P.O.:1080; I.V.:739.8; IV Piggyback:100] Out: 900 [Urine:900]  Labs:  Recent Labs  12/26/14 0130 12/27/14 0642 12/28/14 0520  WBC 6.3 3.9* 5.8  HGB 11.5* 15.3 12.3*  PLT 99* 87* 129*  CREATININE 1.29* 1.23 1.21   Estimated Creatinine Clearance: 49.8 mL/min (by C-G formula based on Cr of 1.21). No results for input(s): VANCOTROUGH, VANCOPEAK, VANCORANDOM, GENTTROUGH, GENTPEAK, GENTRANDOM, TOBRATROUGH, TOBRAPEAK, TOBRARND, AMIKACINPEAK, AMIKACINTROU, AMIKACIN in the last 72 hours.   Microbiology: Recent Results (from the past 720 hour(s))  Culture, blood (routine x 2)     Status: None   Collection Time: 12/22/14 10:45 PM  Result Value Ref Range Status   Specimen Description BLOOD RIGHT ARM  Final   Special Requests BOTTLES DRAWN AEROBIC AND ANAEROBIC 5ML  Final   Culture  Setup Time   Final    GRAM POSITIVE COCCI IN CLUSTERS IN BOTH AEROBIC AND ANAEROBIC BOTTLES CRITICAL RESULT CALLED TO, READ BACK BY AND VERIFIED WITH: Sonnie Alamo RN 1840 12/23/14 A BROWNING    Culture   Final    STAPHYLOCOCCUS AUREUS SUSCEPTIBILITIES PERFORMED ON PREVIOUS CULTURE WITHIN THE LAST 5 DAYS. Performed at [facility]    Report Status 12/25/2014 FINAL  Final  Culture, blood (routine x 2)     Status: None   Collection Time: 12/22/14 11:50 PM  Result Value Ref Range Status   Specimen Description BLOOD LEFT WRIST  Final   Special Requests BOTTLES DRAWN AEROBIC AND ANAEROBIC 5ML  Final   Culture  Setup Time   Final    GRAM POSITIVE COCCI IN CLUSTERS IN BOTH AEROBIC AND ANAEROBIC BOTTLES CRITICAL RESULT CALLED TO, READ BACK BY AND VERIFIED WITH: S ARMSTRONG RN 2029 12/23/14 A BROWNING    Culture   Final    STAPHYLOCOCCUS AUREUS Performed at [facility]    Report Status 12/25/2014 FINAL  Final   Organism ID, Bacteria STAPHYLOCOCCUS AUREUS  Final      Susceptibility   Staphylococcus aureus - MIC*    CIPROFLOXACIN <=0.5 SENSITIVE Sensitive     ERYTHROMYCIN <=0.25 SENSITIVE Sensitive     GENTAMICIN <=0.5 SENSITIVE Sensitive     OXACILLIN 0.5 SENSITIVE Sensitive     TETRACYCLINE <=1 SENSITIVE Sensitive     VANCOMYCIN 1 SENSITIVE Sensitive     TRIMETH/SULFA <=10 SENSITIVE Sensitive     CLINDAMYCIN <=0.25 SENSITIVE Sensitive     RIFAMPIN <=0.5 SENSITIVE Sensitive     Inducible Clindamycin NEGATIVE Sensitive     * STAPHYLOCOCCUS AUREUS  Urine culture     Status: None   Collection Time: 12/23/14  2:08 AM  Result Value Ref Range Status   Specimen Description URINE, RANDOM  Final   Special Requests NONE  Final   Culture   Final    NO GROWTH 1 DAY Performed at [facility]    Report Status 12/24/2014 FINAL  Final  MRSA PCR Screening     Status: None   Collection Time: 12/23/14 10:37 AM  Result Value Ref Range Status  MRSA by PCR NEGATIVE NEGATIVE Final    Comment:        The GeneXpert MRSA Assay (FDA approved for NASAL specimens only), is one component of a comprehensive MRSA colonization surveillance program. It is not intended to diagnose MRSA infection nor to guide or monitor treatment for MRSA infections.   Culture, blood (routine x 2)     Status: None (Preliminary result)   Collection Time: 12/25/14  3:30 PM  Result Value Ref Range Status   Specimen Description BLOOD LEFT ARM  Final   Special Requests BOTTLES DRAWN AEROBIC AND ANAEROBIC  5CC  Final   Culture   Final    NO GROWTH 3 DAYS Performed at [facility]    Report  Status PENDING  Incomplete  Culture, blood (routine x 2)     Status: None (Preliminary result)   Collection Time: 12/25/14  3:45 PM  Result Value Ref Range Status   Specimen Description BLOOD LEFT HAND  Final   Special Requests BOTTLES DRAWN AEROBIC ONLY  [place]  Final   Culture   Final    NO GROWTH 3 DAYS Performed at [facility]    Report Status PENDING  Incomplete    Assessment: 53 yoM presents 10/24 with severe left groin pain, headache, fever. Initially started on antimicrobial regimen for rule out meningitis. CCM now following and reports no no meningeal signs on exam or concern for meningitis /encephalitis. Patient with SIRS with no clear source of infection. Pharmacy was initially consulted for empiric vancomycin and ceftriaxone for r/o sepsis of unknown etiology, but now narrowed to Cefazolin alone for MSSA bacteremia.  10/25 >>zosyn >> x1  10/25 >> vancomycin >> 10/27 10/25 >> Rocephin >>10/27 10/25 >> Ampicillin x 1 10/25 >> Acyclovir x 1 10/27 >> Cefazolin >>  Today, 12/28/2014:  Afebrile  WBC WNL  SCr improved, near baseline, CrCl ~ 49    S/p TEE:  No vegetations noted   Repeat blood cultures no growth to date  Goal of Therapy:  Appropriate abx dosing, eradication of infection.   Plan:   Continue Cefazolin 2g IV q8h.  Follow up renal fxn, culture results, and clinical course.  [name], PharmD, BCPS Pager: [phone] 12/28/2014 11:28 AM

## 2014-12-28 NOTE — Progress Notes (Signed)
[facility] PROGRESS NOTE  [name] KZS:[id number] DOB: 1926/02/10 DOA: 12/22/2014 PCP: Wenda Low, MD  Interim Summary  [name] is an 81 year old gentleman with a past medical history of coronary artery disease status post coronary artery bypass grafting, status post bioprosthetic aortic valve replacement, ace maker implant, chronic kidney disease who was admitted to medicine service on 12/23/2014. He presented with complaints of fever, chills, bilateral groin pain. On admission he had a temperature of 104.8. He was septic and started on broad-spectrum IV antimicrobial therapy with vancomycin and ceftriaxone. Workup included a CT scan of abdomen and pelvis with IV contrast which did not reveal acute intra-abdominal pathology. Scrotal ultrasound did not reveal evidence of testicular torsion. Given septic picture pulmonary critical care medicine was consulted. He was initially treated with IV Vancomycin and Rocephin. His blood culturess grew MSSA from both set sets for which his antimicrobial therapy was narrowed to Cefazolin. Work up for source of infection has been unrevealing. Cardiology was consulted for TEE which did not show evidence of vegetations. He complained of significant groin and anterior thigh pain that was further worked up with a CT scan of pelvis and lumbar spine. This revealed severe OA of lumbar spine. Because pain symptoms were significant to the point of greatly affecting mobility I started him on steroids for which he showed improvement. By 12/28/2014 he was able to ambulate down the hallway.   Assessment/Plan: 1. Sepsis -Present on admission, evidenced by a temperature of 104.8, with respiratory rate of 38, blood pressures of 83/40, lactic acid of 2.7, having positive blood cultures. -Workup this far has included abdominal and pelvis CT that did not reveal acute intra-abdominal pathology. Radiology reported market elevation of the left hemi-diaphragm with  displacement of the stomach head and proximal body of the pancreas. No evidence of acute infection. Scrotal ultrasound did not show evidence for testicular torsion. -Patient undergoing transesophageal echocardiogram on 12/25/2014 which did not show evidence of vegetation. Bioprosthetic valve well seated. -Blood cultures drawn on admission growing methicillin sensitive Staphylococcus aureus from both sets. -Case was discussed with infectious disease, his vancomycin was discontinued as he was started on cefazolin -Blood cultures were repeated on 12/25/2014 -He was further worked up with a CT scan of pelvis and lumbar spine which did not show evidence of acute infectious process.  -Infectious disease who recommended 4 weeks of IV antibiotic therapy with cefazolin.  -Repeat blood cultures were obtained on 12/25/2014. If they remain negative plan would be to place PICC line on 12/29/2014 in anticipation of 4 weeks of IV AB therapy.  -As of 12/28/2014 his repeat blood cultures from 12/25/2014 remain negative.    2.  History of aortic valve replacement with by prostatic valve -As mentioned above patient presenting with sepsis, having blood cultures positive for gram-positive cocci 2 sets. -Transesophageal echocardiogram performed on 12/25/2014 did not show evidence of vegetations. Study showed by prostatic aortic valve well-seated. -There is no evidence of acute CHF. He does not appear to have acute cardiac pulmonary issues.  3.  Type 2 diabetes mellitus -His blood sugars remain stable in the  85-107 range -Continue Accu-Cheks with sliding scale coverage  4.  Bilateral groin pain. -Patient complaining of pain over bilateral groin and inner thigh region. -On physical examination he did not appear to have bulging mass which would suggest hernia. He did have pain with internal rotation of his leg. I wonder about the possibility of symptoms being secondary to osteoarthritis. Other possibilities would  include lumbar stenosis  or bursitis although unlikely. -CT scan of pelvis and lumbar spine performed on 12/25/2014. He was found to have multilevel osseous arthritic changes of the lower lumbar sacral spine. There was no evidence of fracture or subluxation of bilateral hips or lumbosacral spine.  -Unclear where his thigh/groin pain is coming from, he does not appear to have pain with palpation over that region. Doubt this would be reflective of myositis. May be related to OA.  -I started him on steroids on 12/26/2014, treated him with prednisone 60 mg by mouth daily, having subsequent clinical improvement -On 12/28/2014 patient showing progress as we were able to assist him out of bed and ambulate him down the hallway -Will taper his oral steroids.   5.  Hypertension. -Patient having a history of hypertension however since he presented with hypotension in setting of sepsis antihypertensive agents were discontinued Blood pressures remain stable.   6.  History of atrial fibrillation CHADSVasc score of 3 -He presented with a supratherapeutic INR 4.38 for which Coumadin has been held. -INR 1.76 on 12/28/2014 -Consulted pharmacy for Coumadin management. He is currently bridged with IV heparin.   Code Status: Full code Family Communication: Spoke with his wife who was present at bedside Disposition Plan: Continue IV antibiotic therapy over the weekend, plan for PICC line placement on Monday if repeat blood cultures remain negative. I suspect he may need SNF placement.    Consultants:  Critical care medicine  Cardiology  Infectious disease  Antibiotics:  Vancomycin  stopped on 12/25/2014   Ceftriaxone stopped on 12/25/2014  Cefazolin started on 12/25/2014  HPI/Subjective: He states feeling better today, we were able to get him walking down the hallway.   Objective: Filed Vitals:   12/28/14 0551  BP: 152/80  Pulse: 59  Temp: 97.5 F (36.4 C)  Resp: 18    Intake/Output  Summary (Last 24 hours) at 12/28/14 1059 Last data filed at 12/28/14 0906  Gross per 24 hour  Intake 1919.75 ml  Output    900 ml  Net 1019.75 ml   Filed Weights   12/22/14 2200 12/25/14 0855  Weight: 94.802 kg (209 lb) 94.802 kg (209 lb)    Exam:   General:  Patient is nontoxic appearing, awake and alert in no acute distress. He was assisted out of bed and ambulated  Cardiovascular: Regular rate and rhythm normal S1-S2 no murmurs rubs or gallops  Respiratory: Normal respiratory effort, lungs are clear to auscultation bilaterally  Abdomen: Soft nontender nontender positive bowel sounds  Musculoskeletal: Pain symptoms involving b/l thighs improved  Data Reviewed: Basic Metabolic Panel:  Recent Labs Lab 12/24/14 0407 12/25/14 0515 12/26/14 0130 12/27/14 0642 12/28/14 0520  NA 138 137 138 138 138  K 3.8 3.9 3.9 4.6 4.7  CL 106 105 107 107 108  CO2 26 25 24  20* 24  GLUCOSE 123* 121* 132* 189* 244*  BUN 33* 33* 28* 24* 28*  CREATININE 1.85* 1.54* 1.29* 1.23 1.21  CALCIUM 7.9* 8.0* 8.0* 8.5* 8.4*  MG 1.8  --   --   --   --    Liver Function Tests:  Recent Labs Lab 12/22/14 2345 12/23/14 0648 12/23/14 1156 12/23/14 1818 12/28/14 0520  AST 23 39 41 48* 27  ALT 13* 19 19 22  13*  ALKPHOS 42 38 37* 30* 37*  BILITOT 1.2 1.6* 1.3* 1.5* 0.5  PROT 7.2 6.4* 6.0* 5.9* 6.1*  ALBUMIN 3.9 3.5 3.3* 3.1* 3.0*   No results for input(s): LIPASE, AMYLASE in the last  168 hours. No results for input(s): AMMONIA in the last 168 hours. CBC:  Recent Labs Lab 12/22/14 2345 12/23/14 1610 12/24/14 0407 12/25/14 0515 12/26/14 0130 12/27/14 0642 12/28/14 0520  WBC 9.6 10.4 9.6 6.4 6.3 3.9* 5.8  NEUTROABS 8.5* 9.8*  --   --   --   --   --   HGB 13.5 12.7* 12.1* 12.0* 11.5* 15.3 12.3*  HCT 42.5 39.7 38.1* 37.7* 35.7* 46.9 37.1*  MCV 98.6 98.5 99.7 98.2 97.0 97.3 95.4  PLT 170 132* 121* 99* 99* 87* 129*   Cardiac Enzymes:  Recent Labs Lab 12/26/14 1120 12/28/14 0531   CKTOTAL 665* 315   BNP (last 3 results)  Recent Labs  12/23/14 0336  BNP 306.2*    ProBNP (last 3 results) No results for input(s): PROBNP in the last 8760 hours.  CBG:  Recent Labs Lab 12/26/14 2146 12/27/14 0733 12/27/14 1148 12/27/14 1716 12/27/14 2104  GLUCAP 175* 162* 167* 213* 284*    Recent Results (from the past 240 hour(s))  Culture, blood (routine x 2)     Status: None   Collection Time: 12/22/14 10:45 PM  Result Value Ref Range Status   Specimen Description BLOOD RIGHT ARM  Final   Special Requests BOTTLES DRAWN AEROBIC AND ANAEROBIC 5ML  Final   Culture  Setup Time   Final    GRAM POSITIVE COCCI IN CLUSTERS IN BOTH AEROBIC AND ANAEROBIC BOTTLES CRITICAL RESULT CALLED TO, READ BACK BY AND VERIFIED WITH: Sonnie Alamo RN 1840 12/23/14 A BROWNING    Culture   Final    STAPHYLOCOCCUS AUREUS SUSCEPTIBILITIES PERFORMED ON PREVIOUS CULTURE WITHIN THE LAST 5 DAYS. Performed at [facility]    Report Status 12/25/2014 FINAL  Final  Culture, blood (routine x 2)     Status: None   Collection Time: 12/22/14 11:50 PM  Result Value Ref Range Status   Specimen Description BLOOD LEFT WRIST  Final   Special Requests BOTTLES DRAWN AEROBIC AND ANAEROBIC 5ML  Final   Culture  Setup Time   Final    GRAM POSITIVE COCCI IN CLUSTERS IN BOTH AEROBIC AND ANAEROBIC BOTTLES CRITICAL RESULT CALLED TO, READ BACK BY AND VERIFIED WITH: S ARMSTRONG RN 2029 12/23/14 A BROWNING    Culture   Final    STAPHYLOCOCCUS AUREUS Performed at [facility]    Report Status 12/25/2014 FINAL  Final   Organism ID, Bacteria STAPHYLOCOCCUS AUREUS  Final      Susceptibility   Staphylococcus aureus - MIC*    CIPROFLOXACIN <=0.5 SENSITIVE Sensitive     ERYTHROMYCIN <=0.25 SENSITIVE Sensitive     GENTAMICIN <=0.5 SENSITIVE Sensitive     OXACILLIN 0.5 SENSITIVE Sensitive     TETRACYCLINE <=1 SENSITIVE Sensitive     VANCOMYCIN 1 SENSITIVE Sensitive     TRIMETH/SULFA <=10 SENSITIVE  Sensitive     CLINDAMYCIN <=0.25 SENSITIVE Sensitive     RIFAMPIN <=0.5 SENSITIVE Sensitive     Inducible Clindamycin NEGATIVE Sensitive     * STAPHYLOCOCCUS AUREUS  Urine culture     Status: None   Collection Time: 12/23/14  2:08 AM  Result Value Ref Range Status   Specimen Description URINE, RANDOM  Final   Special Requests NONE  Final   Culture   Final    NO GROWTH 1 DAY Performed at [facility]    Report Status 12/24/2014 FINAL  Final  MRSA PCR Screening     Status: None   Collection Time: 12/23/14 10:37  AM  Result Value Ref Range Status   MRSA by PCR NEGATIVE NEGATIVE Final    Comment:        The GeneXpert MRSA Assay (FDA approved for NASAL specimens only), is one component of a comprehensive MRSA colonization surveillance program. It is not intended to diagnose MRSA infection nor to guide or monitor treatment for MRSA infections.   Culture, blood (routine x 2)     Status: None (Preliminary result)   Collection Time: 12/25/14  3:30 PM  Result Value Ref Range Status   Specimen Description BLOOD LEFT ARM  Final   Special Requests BOTTLES DRAWN AEROBIC AND ANAEROBIC  5CC  Final   Culture   Final    NO GROWTH 3 DAYS Performed at [facility]    Report Status PENDING  Incomplete  Culture, blood (routine x 2)     Status: None (Preliminary result)   Collection Time: 12/25/14  3:45 PM  Result Value Ref Range Status   Specimen Description BLOOD LEFT HAND  Final   Special Requests BOTTLES DRAWN AEROBIC ONLY  [place]  Final   Culture   Final    NO GROWTH 3 DAYS Performed at [facility]    Report Status PENDING  Incomplete     Studies: No results found.  Scheduled Meds: .  ceFAZolin (ANCEF) IV  2 g Intravenous 3 times per day  . insulin aspart  0-9 Units Subcutaneous TID WC  . predniSONE  60 mg Oral Q breakfast  . sodium chloride  3 mL Intravenous Q12H  . Warfarin - Pharmacist Dosing Inpatient   Does not apply q1800   Continuous Infusions: .  heparin 1,050 Units/hr (12/28/14 0537)    Principal Problem:   Sepsis ([place]) Active Problems:   Essential hypertension, benign   Cardiac pacemaker in situ   Fever, unspecified   Type 2 diabetes mellitus with vascular disease (HCC)   CKD (chronic kidney disease) stage 3, GFR 30-59 ml/min   Bilateral hip pain   Elevated lactic acid level   Pulmonary edema   Groin pain   Hip pain    Time spent: 25 min    Kelvin Cellar  [facility] Pager [phone]. If 7PM-7AM, please contact night-coverage at [email or web address], password [email or web address] 12/28/2014, 10:59 AM  LOS: 5 days       [facility] PROGRESS NOTE  Carron Brazen [place]thorne WNU:[id number] DOB: 1925/09/28 DOA: 12/22/2014 PCP: Wenda Low, MD  Assessment/Plan: 2. Sepsis -Present on admission, evidenced by a temperature of 104.8, with respiratory rate of 38, blood pressures of 83/40, lactic acid of 2.7, having positive blood cultures. -Workup this far has included abdominal and pelvis CT scan that did not reveal acute intra-abdominal pathology. Radiology reported market elevation of the left hemi-diaphragm with displacement of the stomach head and proximal body of the pancreas. No evidence of acute infection. Scrotal ultrasound did not show evidence for testicular torsion. -Patient undergoing transesophageal echocardiogram on 12/25/2014 which did not show evidence of vegetation. By prostatic valve well seated. -Blood cultures growing methicillin sensitive Staphylococcus aureus from both sets. -Case was discussed with infectious disease, his vancomycin was discontinued as he was started on cefazolin -Blood cultures were repeated on 12/25/2014 -He was further worked up with a CT scan of pelvis and lumbar spine which did not show evidence of acute infectious process.  -Case discussed with infectious disease who recommended 4 weeks of IV antibiotic therapy with cefazolin. Repeat blood cultures were obtained on 12/25/2014. If the remain  negative plan to  place PICC line on 12/29/2014 -Patient remains hemodynamically stable, afebrile, tolerating by mouth intake.  2.  History of aortic valve replacement with by prostatic valve -As mentioned above patient presenting with sepsis, having blood cultures positive for gram-positive cocci 2 sets. -Transesophageal echocardiogram performed on 12/25/2014 did not show evidence of vegetations. Study showed by prostatic aortic valve well-seated. -There is no evidence of acute CHF. He does not appear to have acute cardiac pulmonary issues.  3.  Type 2 diabetes mellitus -His blood sugars remain stable in the  85-107 range -Continue Accu-Cheks with sliding scale coverage  4.  Bilateral groin pain. -Patient complaining of pain over bilateral groin and inner thigh region. -On physical examination he did not appear to have bulging mass which would suggest hernia. He did have pain with internal rotation of his leg. I wonder about the possibility of symptoms being secondary to osteoarthritis. Other possibilities would include lumbar stenosis or bursitis although unlikely. -CT scan of pelvis and lumbar spine performed on 12/25/2014. He was found to have multilevel osseous arthritic changes of the lower lumbar sacral spine. There was no evidence of fracture or subluxation of bilateral hips or lumbosacral spine.  -Unclear where his thigh/groin pain is coming from, he does not appear to have pain with palpation over that region. Doubt this would be reflective of myositis. I started him on steroids on 12/26/2014, treated him with prednisone 60 mg by mouth daily. Today he thinks he may have some improvement.   5.  Hypertension. -Patient having a history of hypertension however since he presented with hypotension in setting of sepsis antihypertensive agents were discontinued Blood pressures remain stable.   6.  History of atrial fibrillation CHADSVasc score of 3 -He presented with a supratherapeutic INR  4.38 for which Coumadin has been held. -INR trended down to 2.23 on 12/24/2014. -Consulted pharmacy for Coumadin management.  Code Status: Full code Family Communication: Spoke with his wife who was present at bedside Disposition Plan: Continue IV antibiotic therapy over the weekend, plan for PICC line placement on Monday if repeat blood cultures remain negative   Consultants:  Critical care medicine  Cardiology  Infectious disease  Antibiotics:  Vancomycin  stopped on 12/25/2014   Ceftriaxone stopped on 12/25/2014  Cefazolin started on 12/25/2014  HPI/Subjective: [name] is an 81 year old gentleman with a past medical history of coronary artery disease status post coronary artery bypass grafting, status post bioprosthetic aortic valve replacement, ace maker implant, chronic kidney disease who was admitted to medicine service on 12/23/2014. He presented with complaints of fever, chills, bilateral groin pain. On admission he had a temperature of 104.8. He was septic and started on broad-spectrum IV antimicrobial therapy with vancomycin and ceftriaxone. Workup included a CT scan of abdomen and pelvis with IV contrast which did not reveal acute intra-abdominal pathology. Scrotal ultrasound did not reveal evidence of testicular torsion. Given septic picture pulmonary critical care medicine was consulted.  Objective: Filed Vitals:   12/28/14 0551  BP: 152/80  Pulse: 59  Temp: 97.5 F (36.4 C)  Resp: 18    Intake/Output Summary (Last 24 hours) at 12/28/14 1100 Last data filed at 12/28/14 0906  Gross per 24 hour  Intake 1919.75 ml  Output    900 ml  Net 1019.75 ml   Filed Weights   12/22/14 2200 12/25/14 0855  Weight: 94.802 kg (209 lb) 94.802 kg (209 lb)    Exam:   General:  Patient is nontoxic appearing, awake and alert in no acute  distress. He was assisted out of bed to chair  Cardiovascular: Regular rate and rhythm normal S1-S2 no murmurs rubs or  gallops  Respiratory: Normal respiratory effort, lungs are clear to auscultation bilaterally  Abdomen: Soft nontender nontender positive bowel sounds  Musculoskeletal: He continues to have pain with flexion and extension of his head bilaterally. Although I think there is some interval improvement to his left hip region.  Data Reviewed: Basic Metabolic Panel:  Recent Labs Lab 12/24/14 0407 12/25/14 0515 12/26/14 0130 12/27/14 0642 12/28/14 0520  NA 138 137 138 138 138  K 3.8 3.9 3.9 4.6 4.7  CL 106 105 107 107 108  CO2 26 25 24  20* 24  GLUCOSE 123* 121* 132* 189* 244*  BUN 33* 33* 28* 24* 28*  CREATININE 1.85* 1.54* 1.29* 1.23 1.21  CALCIUM 7.9* 8.0* 8.0* 8.5* 8.4*  MG 1.8  --   --   --   --    Liver Function Tests:  Recent Labs Lab 12/22/14 2345 12/23/14 0648 12/23/14 1156 12/23/14 1818 12/28/14 0520  AST 23 39 41 48* 27  ALT 13* 19 19 22  13*  ALKPHOS 42 38 37* 30* 37*  BILITOT 1.2 1.6* 1.3* 1.5* 0.5  PROT 7.2 6.4* 6.0* 5.9* 6.1*  ALBUMIN 3.9 3.5 3.3* 3.1* 3.0*   No results for input(s): LIPASE, AMYLASE in the last 168 hours. No results for input(s): AMMONIA in the last 168 hours. CBC:  Recent Labs Lab 12/22/14 2345 12/23/14 4970 12/24/14 0407 12/25/14 0515 12/26/14 0130 12/27/14 0642 12/28/14 0520  WBC 9.6 10.4 9.6 6.4 6.3 3.9* 5.8  NEUTROABS 8.5* 9.8*  --   --   --   --   --   HGB 13.5 12.7* 12.1* 12.0* 11.5* 15.3 12.3*  HCT 42.5 39.7 38.1* 37.7* 35.7* 46.9 37.1*  MCV 98.6 98.5 99.7 98.2 97.0 97.3 95.4  PLT 170 132* 121* 99* 99* 87* 129*   Cardiac Enzymes:  Recent Labs Lab 12/26/14 1120 12/28/14 0531  CKTOTAL 665* 315   BNP (last 3 results)  Recent Labs  12/23/14 0336  BNP 306.2*    ProBNP (last 3 results) No results for input(s): PROBNP in the last 8760 hours.  CBG:  Recent Labs Lab 12/26/14 2146 12/27/14 0733 12/27/14 1148 12/27/14 1716 12/27/14 2104  GLUCAP 175* 162* 167* 213* 284*    Recent Results (from the past 240  hour(s))  Culture, blood (routine x 2)     Status: None   Collection Time: 12/22/14 10:45 PM  Result Value Ref Range Status   Specimen Description BLOOD RIGHT ARM  Final   Special Requests BOTTLES DRAWN AEROBIC AND ANAEROBIC 5ML  Final   Culture  Setup Time   Final    GRAM POSITIVE COCCI IN CLUSTERS IN BOTH AEROBIC AND ANAEROBIC BOTTLES CRITICAL RESULT CALLED TO, READ BACK BY AND VERIFIED WITH: Sonnie Alamo RN 1840 12/23/14 A BROWNING    Culture   Final    STAPHYLOCOCCUS AUREUS SUSCEPTIBILITIES PERFORMED ON PREVIOUS CULTURE WITHIN THE LAST 5 DAYS. Performed at [facility]    Report Status 12/25/2014 FINAL  Final  Culture, blood (routine x 2)     Status: None   Collection Time: 12/22/14 11:50 PM  Result Value Ref Range Status   Specimen Description BLOOD LEFT WRIST  Final   Special Requests BOTTLES DRAWN AEROBIC AND ANAEROBIC 5ML  Final   Culture  Setup Time   Final    GRAM POSITIVE COCCI IN CLUSTERS IN BOTH AEROBIC AND ANAEROBIC  BOTTLES CRITICAL RESULT CALLED TO, READ BACK BY AND VERIFIED WITH: S ARMSTRONG RN 2029 12/23/14 A BROWNING    Culture   Final    STAPHYLOCOCCUS AUREUS Performed at [facility]    Report Status 12/25/2014 FINAL  Final   Organism ID, Bacteria STAPHYLOCOCCUS AUREUS  Final      Susceptibility   Staphylococcus aureus - MIC*    CIPROFLOXACIN <=0.5 SENSITIVE Sensitive     ERYTHROMYCIN <=0.25 SENSITIVE Sensitive     GENTAMICIN <=0.5 SENSITIVE Sensitive     OXACILLIN 0.5 SENSITIVE Sensitive     TETRACYCLINE <=1 SENSITIVE Sensitive     VANCOMYCIN 1 SENSITIVE Sensitive     TRIMETH/SULFA <=10 SENSITIVE Sensitive     CLINDAMYCIN <=0.25 SENSITIVE Sensitive     RIFAMPIN <=0.5 SENSITIVE Sensitive     Inducible Clindamycin NEGATIVE Sensitive     * STAPHYLOCOCCUS AUREUS  Urine culture     Status: None   Collection Time: 12/23/14  2:08 AM  Result Value Ref Range Status   Specimen Description URINE, RANDOM  Final   Special Requests NONE  Final    Culture   Final    NO GROWTH 1 DAY Performed at [facility]    Report Status 12/24/2014 FINAL  Final  MRSA PCR Screening     Status: None   Collection Time: 12/23/14 10:37 AM  Result Value Ref Range Status   MRSA by PCR NEGATIVE NEGATIVE Final    Comment:        The GeneXpert MRSA Assay (FDA approved for NASAL specimens only), is one component of a comprehensive MRSA colonization surveillance program. It is not intended to diagnose MRSA infection nor to guide or monitor treatment for MRSA infections.   Culture, blood (routine x 2)     Status: None (Preliminary result)   Collection Time: 12/25/14  3:30 PM  Result Value Ref Range Status   Specimen Description BLOOD LEFT ARM  Final   Special Requests BOTTLES DRAWN AEROBIC AND ANAEROBIC  5CC  Final   Culture   Final    NO GROWTH 3 DAYS Performed at [facility]    Report Status PENDING  Incomplete  Culture, blood (routine x 2)     Status: None (Preliminary result)   Collection Time: 12/25/14  3:45 PM  Result Value Ref Range Status   Specimen Description BLOOD LEFT HAND  Final   Special Requests BOTTLES DRAWN AEROBIC ONLY  [place]  Final   Culture   Final    NO GROWTH 3 DAYS Performed at [facility]    Report Status PENDING  Incomplete     Studies: No results found.  Scheduled Meds: .  ceFAZolin (ANCEF) IV  2 g Intravenous 3 times per day  . insulin aspart  0-9 Units Subcutaneous TID WC  . predniSONE  60 mg Oral Q breakfast  . sodium chloride  3 mL Intravenous Q12H  . Warfarin - Pharmacist Dosing Inpatient   Does not apply q1800   Continuous Infusions: . heparin 1,050 Units/hr (12/28/14 0537)    Principal Problem:   Sepsis ([place]) Active Problems:   Essential hypertension, benign   Cardiac pacemaker in situ   Fever, unspecified   Type 2 diabetes mellitus with vascular disease (HCC)   CKD (chronic kidney disease) stage 3, GFR 30-59 ml/min   Bilateral hip pain   Elevated lactic acid level    Pulmonary edema   Groin pain   Hip pain    Time spent: 25  min    Kelvin Cellar  [facility] Pager [phone]. If 7PM-7AM, please contact night-coverage at [email or web address], password [email or web address] 12/28/2014, 11:00 AM  LOS: 5 days

## 2014-12-28 NOTE — Progress Notes (Signed)
ANTICOAGULATION CONSULT NOTE - Follow Up Consult  Pharmacy Consult for Heparin Indication: Prosthetic aortic valve replacement, DVT, Afib  Allergies  Allergen Reactions  . Demerol [Meperidine] Other (See Comments)    "hallucinations"    Patient Measurements: Height: 5\' 9"  (175.3 cm) Weight: 209 lb (94.802 kg) IBW/kg (Calculated) : 70.7 Heparin Dosing Weight:   Vital Signs: Temp: 97.5 F (36.4 C) (10/30 0551) Temp Source: Oral (10/30 0551) BP: 152/80 mmHg (10/30 0551) Pulse Rate: 59 (10/30 0551)  Labs:  Recent Labs  12/26/14 0130 12/26/14 1120 12/27/14 0642 12/27/14 1839 12/28/14 0520 12/28/14 0521  HGB 11.5*  --  15.3  --  12.3*  --   HCT 35.7*  --  46.9  --  37.1*  --   PLT 99*  --  87*  --  129*  --   LABPROT 20.0*  --  18.1*  --  20.5*  --   INR 1.71*  --  1.49  --  1.76*  --   HEPARINUNFRC 0.53 0.46 1.06* 0.83*  --  0.55  CREATININE 1.29*  --  1.23  --  1.21  --   CKTOTAL  --  665*  --   --   --   --     Estimated Creatinine Clearance: 49.8 mL/min (by C-G formula based on Cr of 1.21).   Medications:  Infusions:  . sodium chloride 75 mL/hr at 12/28/14 0537  . heparin 1,050 Units/hr (12/28/14 0537)    Assessment: Patient with heparin level at goal.  No heparin issues noted.  Goal of Therapy:  Heparin level 0.3-0.7 units/ml Monitor platelets by anticoagulation protocol: Yes   Plan:  Continue heparin drip at current rate Recheck level at [place], [place] 12/28/2014,6:17 AM

## 2014-12-28 NOTE — Progress Notes (Addendum)
ANTICOAGULATION CONSULT NOTE - Follow up [place] for Heparin IV, Warfarin Indication: Prosthetic aortic valve replacement, DVT, Afib  Allergies  Allergen Reactions  . Demerol [Meperidine] Other (See Comments)    "hallucinations"    Patient Measurements: Height: 5\' 9"  (175.3 cm) Weight: 209 lb (94.802 kg) IBW/kg (Calculated) : 70.7  Heparin Dosing Weight: 90 kg  Vital Signs: Temp: 97.5 F (36.4 C) (10/30 0551) Temp Source: Oral (10/30 0551) BP: 152/80 mmHg (10/30 0551) Pulse Rate: 59 (10/30 0551)  Labs:  Recent Labs  12/26/14 0130 12/26/14 1120 12/27/14 0642 12/27/14 1839 12/28/14 0520 12/28/14 0521 12/28/14 0531  HGB 11.5*  --  15.3  --  12.3*  --   --   HCT 35.7*  --  46.9  --  37.1*  --   --   PLT 99*  --  87*  --  129*  --   --   LABPROT 20.0*  --  18.1*  --  20.5*  --   --   INR 1.71*  --  1.49  --  1.76*  --   --   HEPARINUNFRC 0.53 0.46 1.06* 0.83*  --  0.55  --   CREATININE 1.29*  --  1.23  --  1.21  --   --   CKTOTAL  --  665*  --   --   --   --  315    Estimated Creatinine Clearance: 49.8 mL/min (by C-G formula based on Cr of 1.21).  Medications:  Infusions:  . heparin 1,050 Units/hr (12/28/14 0537)    Assessment: 40 yoM presented to ED on 10/24 with groin pain and headache.  PMH includes CAD s/p CABG, symptomatic bradycardia s/p pacemaker, CKD, DM2, DVT,  bioprosthetic aortic valve, and afib on chronic warfarin.  CT head no evidence of acute infarction, mass lesion, or intra- or extra-axial hemorrhage.  Warfarin was been placed on hold for possible lumbar puncture.  Pharmacy consulted to dose IV heparin while INR < 2 and resume warfarin dosing on 10/28.  Home warfarin dose reported as 2.5mg  daily (last dose taken 10/23).    INR was 1.9 at [facility] visit on 10/24, but was supratherapeutic with INR 4.38 on admission 10/25.  He has been therapeutic (or very near therapeutic) with stated home dose of warfarin 2.5mg  daily with  regular visits to [facility] Clinic since prior to 01/2014.  Supratherapeutic INR on admission may have been d/t acute infection.  Today, 12/28/2014:  INR 1.76, subtherapeutic but increasing.  Gave higher dose yesterday so anticipating INR will continue to increase tomorrow.  Will plan to resume home dose today.  Heparin level 0.55, therapeutic this AM with infusion at 1050 units/hr.  CBC: Hgb increased to 15.3, and Plt remain low at 87K today  Rn reports no bleeding or complications.  SCr improving, CrCl~49 ml/min  No major drug-drug interactions noted.  Goal of Therapy:  INR 2-3 Heparin level 0.3-0.7 units/ml Monitor platelets by anticoagulation protocol: Yes   Plan:   Continue heparin infusion at 1050 units/hr.  Check heparin level this afternoon to confirm rate  Warfarin 2.5mg  PO today.  Daily INR, heparin level, and CBC   [name], PharmD, BCPS Pager: [phone] 12/28/2014 11:13 AM  ADDENDUM: 12/28/2014 5:44 PM Per lab, unable to obtain heparin level after multiple attempts. Patient is a very difficult stick.  I told lab that since this morning's level was good and INR increasing towards therapeutic level, I will cancel the confirmatory level for this  afternoon to avoid further patient discomfort for today.  Check heparin level and INR tomorrow morning.  [name], PharmD, BCPS Pager: [phone] 12/28/2014 5:45 PM

## 2014-12-29 DIAGNOSIS — R627 Adult failure to thrive: Secondary | ICD-10-CM | POA: Insufficient documentation

## 2014-12-29 DIAGNOSIS — I10 Essential (primary) hypertension: Secondary | ICD-10-CM

## 2014-12-29 LAB — GLUCOSE, CAPILLARY
GLUCOSE-CAPILLARY: 296 mg/dL — AB (ref 65–99)
Glucose-Capillary: 200 mg/dL — ABNORMAL HIGH (ref 65–99)
Glucose-Capillary: 220 mg/dL — ABNORMAL HIGH (ref 65–99)

## 2014-12-29 LAB — PROTIME-INR
INR: 2.55 — ABNORMAL HIGH (ref 0.00–1.49)
PROTHROMBIN TIME: 27.1 s — AB (ref 11.6–15.2)

## 2014-12-29 LAB — HEPARIN LEVEL (UNFRACTIONATED): Heparin Unfractionated: 0.38 IU/mL (ref 0.30–0.70)

## 2014-12-29 MED ORDER — WARFARIN 1.25 MG HALF TABLET
1.2500 mg | ORAL_TABLET | Freq: Once | ORAL | Status: AC
  Administered 2014-12-29: 1.25 mg via ORAL
  Filled 2014-12-29: qty 1

## 2014-12-29 NOTE — Progress Notes (Signed)
Physical Therapy Treatment Patient Details Name: [name] MRN: [id number] DOB: 16-Mar-1925 Today's Date: 12/29/2014    History of Present Illness 81 year old male with a past medical history of coronary artery disease status post coronary artery bypass grafting, status post bioprosthetic aortic valve replacement, pacemaker implant, chronic kidney disease and admitted with c/o bilateral groin pain, sepsis     PT Comments    Pt slowly progressing with mobility however continues to require increased assist.  Updated d/c recommendations to SNF.  Follow Up Recommendations  Supervision/Assistance - 24 hour;SNF     Equipment Recommendations  None recommended by PT    Recommendations for Other Services       Precautions / Restrictions Precautions Precautions: Fall Precaution Comments: L AFO    Mobility  Bed Mobility Overal bed mobility: Needs Assistance;+2 for physical assistance Bed Mobility: Supine to Sit     Supine to sit: Max assist;HOB elevated     General bed mobility comments: assist for LEs over to EOB and then trunk upright  Transfers Overall transfer level: Needs assistance Equipment used: Rolling walker (2 wheeled) Transfers: Sit to/from Stand Sit to Stand: +2 physical assistance;Max assist         General transfer comment: verbal cues for LE positioning, hand placement, elevated bed, increased assist due to weakness and posterior lean against bed, required a couple attempts to find COG with verbal cues  Ambulation/Gait Ambulation/Gait assistance: Min assist Ambulation Distance (Feet): 38 Feet (total) Assistive device: Rolling walker (2 wheeled) Gait Pattern/deviations: Step-through pattern;Decreased stride length;Trunk flexed     General Gait Details: verbal cues for RW positioning, step length, required seated rest break, 18x1, 20'x1, monitored SPO2 with remained 92% or above on room air (left O2 [place] off upon return to room and RN  aware)   Stairs            Wheelchair Mobility    Modified Rankin (Stroke Patients Only)       Balance Overall balance assessment: Needs assistance         Standing balance support: Bilateral upper extremity supported Standing balance-Leahy Scale: Zero Standing balance comment: posterior lean upon standing requiring cues to correct                     Cognition Arousal/Alertness: Awake/alert Behavior During Therapy: WFL for tasks assessed/performed Overall Cognitive Status: Within Functional Limits for tasks assessed                      Exercises      General Comments        Pertinent Vitals/Pain Pain Assessment: 0-10 Pain Score: 8  Pain Location: bilateral groin with mobility Pain Intervention(s): Limited activity within patient's tolerance;Monitored during session;Premedicated before session;Repositioned    Home Living                      Prior Function            PT Goals (current goals can now be found in the care plan section) Progress towards PT goals: Progressing toward goals    Frequency  Min 3X/week    PT Plan Discharge plan needs to be updated    Co-evaluation             End of Session Equipment Utilized During Treatment: Gait belt Activity Tolerance: Patient limited by pain;Patient limited by fatigue Patient left: in chair;with call bell/phone within reach;with chair alarm set  Time: 7841-2820 PT Time Calculation (min) (ACUTE ONLY): 28 min  Charges:  $Gait Training: 23-37 mins                    G Codes:      Lenisha Lacap,KATHrine E 01-Jan-2015, 11:26 AM Carmelia Bake, PT, DPT 01-Jan-2015 Pager: [phone]

## 2014-12-29 NOTE — Progress Notes (Signed)
[facility] PROGRESS NOTE  MAHMOUD BLAZEJEWSKI XBW:[id number] DOB: 07-12-1925 DOA: 12/22/2014 PCP: Wenda Low, MD  Interim Summary  Mr Slone is an 81 year old gentleman with a past medical history of coronary artery disease status post coronary artery bypass grafting, status post bioprosthetic aortic valve replacement, ace maker implant, chronic kidney disease who was admitted to medicine service on 12/23/2014. He presented with complaints of fever, chills, bilateral groin pain. On admission he had a temperature of 104.8. He was septic and started on broad-spectrum IV antimicrobial therapy with vancomycin and ceftriaxone. Workup included a CT scan of abdomen and pelvis with IV contrast which did not reveal acute intra-abdominal pathology. Scrotal ultrasound did not reveal evidence of testicular torsion. Given septic picture pulmonary critical care medicine was consulted. He was initially treated with IV Vancomycin and Rocephin. His blood culturess grew MSSA from both set sets for which his antimicrobial therapy was narrowed to Cefazolin. Work up for source of infection has been unrevealing. Cardiology was consulted for TEE which did not show evidence of vegetations. He complained of significant groin and anterior thigh pain that was further worked up with a CT scan of pelvis and lumbar spine. This revealed severe OA of lumbar spine. Because pain symptoms were significant to the point of greatly affecting mobility I started him on steroids for which he showed improvement. By 12/28/2014 he was able to ambulate down the hallway.   Assessment/Plan: 1. Sepsis -Present on admission, evidenced by a temperature of 104.8, with respiratory rate of 38, blood pressures of 83/40, lactic acid of 2.7, having positive blood cultures. -Workup this far has included abdominal and pelvis CT that did not reveal acute intra-abdominal pathology. Radiology reported market elevation of the left hemi-diaphragm with  displacement of the stomach head and proximal body of the pancreas. No evidence of acute infection. Scrotal ultrasound did not show evidence for testicular torsion. -Patient undergoing transesophageal echocardiogram on 12/25/2014 which did not show evidence of vegetation. Bioprosthetic valve well seated. -Blood cultures drawn on admission growing methicillin sensitive Staphylococcus aureus from both sets. -Case was discussed with infectious disease, his vancomycin was discontinued as he was started on cefazolin -Blood cultures were repeated on 12/25/2014 -He was further worked up with a CT scan of pelvis and lumbar spine which did not show evidence of acute infectious process.  -Infectious disease who recommended 4 weeks of IV antibiotic therapy with cefazolin.  -Repeat blood cultures were obtained on 12/25/2014, remain negative, place PICC line on 12/29/2014 in anticipation of 4 weeks of IV AB therapy.    2.  History of aortic valve replacement with by prostatic valve -As mentioned above patient presenting with sepsis, having blood cultures positive for gram-positive cocci 2 sets. -Transesophageal echocardiogram performed on 12/25/2014 did not show evidence of vegetations. Study showed by prostatic aortic valve well-seated. -There is no evidence of acute CHF. He does not appear to have acute cardiac pulmonary issues. -coumadin restarted on 10/31, inr therapeutic  3.  Type 2 diabetes mellitus -His blood sugars remain stable in the  85-107 range -Continue Accu-Cheks with sliding scale coverage  4.  Bilateral groin pain. -Patient complaining of pain over bilateral groin and inner thigh region. -On physical examination he did not appear to have bulging mass which would suggest hernia. He did have pain with internal rotation of his leg. I wonder about the possibility of symptoms being secondary to osteoarthritis. Other possibilities would include lumbar stenosis or bursitis although unlikely. -CT  scan of pelvis and lumbar spine performed  on 12/25/2014. He was found to have multilevel osseous arthritic changes of the lower lumbar sacral spine. There was no evidence of fracture or subluxation of bilateral hips or lumbosacral spine.  -Unclear where his thigh/groin pain is coming from, he does not appear to have pain with palpation over that region. Doubt this would be reflective of myositis. May be related to OA.  -I started him on steroids on 12/26/2014, treated him with prednisone 60 mg by mouth daily, having subsequent clinical improvement -On 12/28/2014 patient showing progress as we were able to assist him out of bed and ambulate him down the hallway -taper his oral steroids.   5.  Hypertension. -Patient having a history of hypertension however since he presented with hypotension in setting of sepsis antihypertensive agents were discontinued Blood pressures remain stable.   6.  History of atrial fibrillation CHADSVasc score of 3 -He presented with a supratherapeutic INR 4.38 for which Coumadin has been held. -INR 1.76 on 12/28/2014 -Consulted pharmacy for Coumadin management. He is currently bridged with IV heparin.  -inr therapeutic, d/c heparin drip on 10/31.  Code Status: Full code Family Communication: Spoke with his wife who was present at bedside Disposition Plan:   SNF placement when bed available in 1-2 days   Consultants:  Critical care medicine  Cardiology  Infectious disease  Antibiotics:  Vancomycin  stopped on 12/25/2014   Ceftriaxone stopped on 12/25/2014  Cefazolin started on 12/25/2014  HPI/Subjective: Stable, still frail, with limited mobility and continue to require assist with ambulation.  Objective: Filed Vitals:   12/29/14 1309  BP: 123/61  Pulse: 62  Temp: 97.8 F (36.6 C)  Resp: 18    Intake/Output Summary (Last 24 hours) at 12/29/14 1711 Last data filed at 12/29/14 1300  Gross per 24 hour  Intake  645.5 ml  Output    925 ml  Net  -279.5 ml   Filed Weights   12/22/14 2200 12/25/14 0855  Weight: 209 lb (94.802 kg) 209 lb (94.802 kg)    Exam:   General:  Patient is nontoxic appearing, awake and alert in no acute distress. He was assisted out of bed and ambulated  Cardiovascular: Regular rate and rhythm normal S1-S2 no murmurs rubs or gallops  Respiratory: Normal respiratory effort, lungs are clear to auscultation bilaterally  Abdomen: Soft nontender nontender positive bowel sounds  Musculoskeletal: Pain symptoms involving b/l thighs improved  Data Reviewed: Basic Metabolic Panel:  Recent Labs Lab 12/24/14 0407 12/25/14 0515 12/26/14 0130 12/27/14 0642 12/28/14 0520  NA 138 137 138 138 138  K 3.8 3.9 3.9 4.6 4.7  CL 106 105 107 107 108  CO2 26 25 24  20* 24  GLUCOSE 123* 121* 132* 189* 244*  BUN 33* 33* 28* 24* 28*  CREATININE 1.85* 1.54* 1.29* 1.23 1.21  CALCIUM 7.9* 8.0* 8.0* 8.5* 8.4*  MG 1.8  --   --   --   --    Liver Function Tests:  Recent Labs Lab 12/22/14 2345 12/23/14 0648 12/23/14 1156 12/23/14 1818 12/28/14 0520  AST 23 39 41 48* 27  ALT 13* 19 19 22  13*  ALKPHOS 42 38 37* 30* 37*  BILITOT 1.2 1.6* 1.3* 1.5* 0.5  PROT 7.2 6.4* 6.0* 5.9* 6.1*  ALBUMIN 3.9 3.5 3.3* 3.1* 3.0*   No results for input(s): LIPASE, AMYLASE in the last 168 hours. No results for input(s): AMMONIA in the last 168 hours. CBC:  Recent Labs Lab 12/22/14 2345 12/23/14 9211 12/24/14 0407 12/25/14 0515 12/26/14  0130 12/27/14 0642 12/28/14 0520  WBC 9.6 10.4 9.6 6.4 6.3 3.9* 5.8  NEUTROABS 8.5* 9.8*  --   --   --   --   --   HGB 13.5 12.7* 12.1* 12.0* 11.5* 15.3 12.3*  HCT 42.5 39.7 38.1* 37.7* 35.7* 46.9 37.1*  MCV 98.6 98.5 99.7 98.2 97.0 97.3 95.4  PLT 170 132* 121* 99* 99* 87* 129*   Cardiac Enzymes:  Recent Labs Lab 12/26/14 1120 12/28/14 0531  CKTOTAL 665* 315   BNP (last 3 results)  Recent Labs  12/23/14 0336  BNP 306.2*    ProBNP (last 3 results) No results for  input(s): PROBNP in the last 8760 hours.  CBG:  Recent Labs Lab 12/28/14 1142 12/28/14 1651 12/28/14 2227 12/29/14 0733 12/29/14 1128  GLUCAP 191* 200* 267* 200* 220*    Recent Results (from the past 240 hour(s))  Culture, blood (routine x 2)     Status: None   Collection Time: 12/22/14 10:45 PM  Result Value Ref Range Status   Specimen Description BLOOD RIGHT ARM  Final   Special Requests BOTTLES DRAWN AEROBIC AND ANAEROBIC 5ML  Final   Culture  Setup Time   Final    GRAM POSITIVE COCCI IN CLUSTERS IN BOTH AEROBIC AND ANAEROBIC BOTTLES CRITICAL RESULT CALLED TO, READ BACK BY AND VERIFIED WITH: Sonnie Alamo RN 1840 12/23/14 A BROWNING    Culture   Final    STAPHYLOCOCCUS AUREUS SUSCEPTIBILITIES PERFORMED ON PREVIOUS CULTURE WITHIN THE LAST 5 DAYS. Performed at [facility]    Report Status 12/25/2014 FINAL  Final  Culture, blood (routine x 2)     Status: None   Collection Time: 12/22/14 11:50 PM  Result Value Ref Range Status   Specimen Description BLOOD LEFT WRIST  Final   Special Requests BOTTLES DRAWN AEROBIC AND ANAEROBIC 5ML  Final   Culture  Setup Time   Final    GRAM POSITIVE COCCI IN CLUSTERS IN BOTH AEROBIC AND ANAEROBIC BOTTLES CRITICAL RESULT CALLED TO, READ BACK BY AND VERIFIED WITH: S ARMSTRONG RN 2029 12/23/14 A BROWNING    Culture   Final    STAPHYLOCOCCUS AUREUS Performed at [facility]    Report Status 12/25/2014 FINAL  Final   Organism ID, Bacteria STAPHYLOCOCCUS AUREUS  Final      Susceptibility   Staphylococcus aureus - MIC*    CIPROFLOXACIN <=0.5 SENSITIVE Sensitive     ERYTHROMYCIN <=0.25 SENSITIVE Sensitive     GENTAMICIN <=0.5 SENSITIVE Sensitive     OXACILLIN 0.5 SENSITIVE Sensitive     TETRACYCLINE <=1 SENSITIVE Sensitive     VANCOMYCIN 1 SENSITIVE Sensitive     TRIMETH/SULFA <=10 SENSITIVE Sensitive     CLINDAMYCIN <=0.25 SENSITIVE Sensitive     RIFAMPIN <=0.5 SENSITIVE Sensitive     Inducible Clindamycin NEGATIVE  Sensitive     * STAPHYLOCOCCUS AUREUS  Urine culture     Status: None   Collection Time: 12/23/14  2:08 AM  Result Value Ref Range Status   Specimen Description URINE, RANDOM  Final   Special Requests NONE  Final   Culture   Final    NO GROWTH 1 DAY Performed at [facility]    Report Status 12/24/2014 FINAL  Final  MRSA PCR Screening     Status: None   Collection Time: 12/23/14 10:37 AM  Result Value Ref Range Status   MRSA by PCR NEGATIVE NEGATIVE Final    Comment:  The GeneXpert MRSA Assay (FDA approved for NASAL specimens only), is one component of a comprehensive MRSA colonization surveillance program. It is not intended to diagnose MRSA infection nor to guide or monitor treatment for MRSA infections.   Culture, blood (routine x 2)     Status: None (Preliminary result)   Collection Time: 12/25/14  3:30 PM  Result Value Ref Range Status   Specimen Description BLOOD LEFT ARM  Final   Special Requests BOTTLES DRAWN AEROBIC AND ANAEROBIC  5CC  Final   Culture   Final    NO GROWTH 4 DAYS Performed at [facility]    Report Status PENDING  Incomplete  Culture, blood (routine x 2)     Status: None (Preliminary result)   Collection Time: 12/25/14  3:45 PM  Result Value Ref Range Status   Specimen Description BLOOD LEFT HAND  Final   Special Requests BOTTLES DRAWN AEROBIC ONLY  [place]  Final   Culture   Final    NO GROWTH 4 DAYS Performed at [facility]    Report Status PENDING  Incomplete     Studies: No results found.  Scheduled Meds: .  ceFAZolin (ANCEF) IV  2 g Intravenous 3 times per day  . insulin aspart  0-9 Units Subcutaneous TID WC  . predniSONE  10 mg Oral PC supper  . [START ON 12/30/2014] predniSONE  10 mg Oral 3 x daily with food  . [START ON 12/31/2014] predniSONE  10 mg Oral 4X daily taper  . predniSONE  20 mg Oral Nightly  . predniSONE  20 mg Oral Nightly  . sodium chloride  3 mL Intravenous Q12H  . warfarin  1.25 mg Oral  ONCE-1800  . Warfarin - Pharmacist Dosing Inpatient   Does not apply q1800   Continuous Infusions:    Principal Problem:   Sepsis ([place]) Active Problems:   Essential hypertension, benign   Cardiac pacemaker in situ   Fever, unspecified   Type 2 diabetes mellitus with vascular disease (HCC)   CKD (chronic kidney disease) stage 3, GFR 30-59 ml/min   Bilateral hip pain   Elevated lactic acid level   Pulmonary edema   Groin pain   Hip pain    Time spent: 25 min  Time spent: 25 min    Blakely Maranan MD PhD  [facility] Pager [phone] If 7PM-7AM, please contact night-coverage at [email or web address], password [facility] 12/29/2014, 5:11 PM  LOS: 6 days

## 2014-12-29 NOTE — Care Management Note (Signed)
Case Management Note  Patient Details  Name: [name] MRN: [id number] Date of Birth: 1926-01-28  Subjective/Objective:  Pt noted recc SNF @ d/c.PAtient/spouse in agreement, has children who work, but can asst after work. For PICC-long term iv abx.  [facility] following.CSW notified.                Action/Plan:d/c plan SNF   Expected Discharge Date:   (UNKNOWN)               Expected Discharge Plan:  Skilled Nursing Facility  In-House Referral:  Clinical Social Work  Discharge planning Services  CM Consult  Post Acute Care Choice:    Choice offered to:  Patient  DME Arranged:    DME Agency:     HH Arranged:    [address] Agency:     Status of Service:  In process, will continue to follow  Medicare Important Message Given:  Yes-second notification given Date Medicare IM Given:    Medicare IM give by:    Date Additional Medicare IM Given:    Additional Medicare Important Message give by:     If discussed at [place] of Stay Meetings, dates discussed:    Additional Comments:  Dessa Phi, RN 12/29/2014, 3:45 PM

## 2014-12-29 NOTE — Progress Notes (Signed)
[place] for Infectious Disease   Reason for visit: Follow up on Staph aureus bacteremia  Interval History: he is doing well, no fever, walking some.  No rash, no diarrhea.  TEE negative for vegetation, negative for pacemaker lead vegetation.    Physical Exam: Constitutional:  Filed Vitals:   12/29/14 0526  BP: 181/87  Pulse:   Temp:   Resp:   ; patient appears in NAD Eyes: anicteric HENT: no oral lesions Respiratory: Normal respiratory effort; CTA B Cardiovascular: RRR; pacemaker pocket without erythema  ROS: Constitutional: negative for fevers, chills and malaise Musculoskeletal: positive for hip pain persists but improved overall  Lab Results  Component Value Date   WBC 5.8 12/28/2014   HGB 12.3* 12/28/2014   HCT 37.1* 12/28/2014   MCV 95.4 12/28/2014   PLT 129* 12/28/2014    Lab Results  Component Value Date   CREATININE 1.21 12/28/2014   BUN 28* 12/28/2014   NA 138 12/28/2014   K 4.7 12/28/2014   CL 108 12/28/2014   CO2 24 12/28/2014    Lab Results  Component Value Date   ALT 13* 12/28/2014   AST 27 12/28/2014   ALKPHOS 37* 12/28/2014     Microbiology: Recent Results (from the past 240 hour(s))  Culture, blood (routine x 2)     Status: None   Collection Time: 12/22/14 10:45 PM  Result Value Ref Range Status   Specimen Description BLOOD RIGHT ARM  Final   Special Requests BOTTLES DRAWN AEROBIC AND ANAEROBIC 5ML  Final   Culture  Setup Time   Final    GRAM POSITIVE COCCI IN CLUSTERS IN BOTH AEROBIC AND ANAEROBIC BOTTLES CRITICAL RESULT CALLED TO, READ BACK BY AND VERIFIED WITH: Sonnie Alamo RN 1840 12/23/14 A BROWNING    Culture   Final    STAPHYLOCOCCUS AUREUS SUSCEPTIBILITIES PERFORMED ON PREVIOUS CULTURE WITHIN THE LAST 5 DAYS. Performed at [facility]    Report Status 12/25/2014 FINAL  Final  Culture, blood (routine x 2)     Status: None   Collection Time: 12/22/14 11:50 PM  Result Value Ref Range Status   Specimen Description  BLOOD LEFT WRIST  Final   Special Requests BOTTLES DRAWN AEROBIC AND ANAEROBIC 5ML  Final   Culture  Setup Time   Final    GRAM POSITIVE COCCI IN CLUSTERS IN BOTH AEROBIC AND ANAEROBIC BOTTLES CRITICAL RESULT CALLED TO, READ BACK BY AND VERIFIED WITH: S ARMSTRONG RN 2029 12/23/14 A BROWNING    Culture   Final    STAPHYLOCOCCUS AUREUS Performed at [facility]    Report Status 12/25/2014 FINAL  Final   Organism ID, Bacteria STAPHYLOCOCCUS AUREUS  Final      Susceptibility   Staphylococcus aureus - MIC*    CIPROFLOXACIN <=0.5 SENSITIVE Sensitive     ERYTHROMYCIN <=0.25 SENSITIVE Sensitive     GENTAMICIN <=0.5 SENSITIVE Sensitive     OXACILLIN 0.5 SENSITIVE Sensitive     TETRACYCLINE <=1 SENSITIVE Sensitive     VANCOMYCIN 1 SENSITIVE Sensitive     TRIMETH/SULFA <=10 SENSITIVE Sensitive     CLINDAMYCIN <=0.25 SENSITIVE Sensitive     RIFAMPIN <=0.5 SENSITIVE Sensitive     Inducible Clindamycin NEGATIVE Sensitive     * STAPHYLOCOCCUS AUREUS  Urine culture     Status: None   Collection Time: 12/23/14  2:08 AM  Result Value Ref Range Status   Specimen Description URINE, RANDOM  Final   Special Requests NONE  Final  Culture   Final    NO GROWTH 1 DAY Performed at [facility]    Report Status 12/24/2014 FINAL  Final  MRSA PCR Screening     Status: None   Collection Time: 12/23/14 10:37 AM  Result Value Ref Range Status   MRSA by PCR NEGATIVE NEGATIVE Final    Comment:        The GeneXpert MRSA Assay (FDA approved for NASAL specimens only), is one component of a comprehensive MRSA colonization surveillance program. It is not intended to diagnose MRSA infection nor to guide or monitor treatment for MRSA infections.   Culture, blood (routine x 2)     Status: None (Preliminary result)   Collection Time: 12/25/14  3:30 PM  Result Value Ref Range Status   Specimen Description BLOOD LEFT ARM  Final   Special Requests BOTTLES DRAWN AEROBIC AND ANAEROBIC  5CC   Final   Culture   Final    NO GROWTH 4 DAYS Performed at [facility]    Report Status PENDING  Incomplete  Culture, blood (routine x 2)     Status: None (Preliminary result)   Collection Time: 12/25/14  3:45 PM  Result Value Ref Range Status   Specimen Description BLOOD LEFT HAND  Final   Special Requests BOTTLES DRAWN AEROBIC ONLY  [place]  Final   Culture   Final    NO GROWTH 4 DAYS Performed at [facility]    Report Status PENDING  Incomplete    Impression:  1. 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 Pacemaker in the setting of bacteremia, no vegetation on leads 3. Hip pain of unknown etiology, no radiographic evidence of infection  Plan: 1.  4 weeks of IV cefazolin through November 23rd 2.  picc line (ordered) 3.  Weekly cbc, cmp to [facility] 4. We will arrange follow up in [facility] prior to completion, keep picc in until seen by ID 5.  Follow up with cardiology  I will sign off, please call with questions. thanks

## 2014-12-29 NOTE — Progress Notes (Signed)
ANTICOAGULATION CONSULT NOTE - Follow up [place] for Heparin IV, Warfarin Indication: Prosthetic aortic valve replacement, DVT, Afib  Allergies  Allergen Reactions  . Demerol [Meperidine] Other (See Comments)    "hallucinations"    Patient Measurements: Height: 5\' 9"  (175.3 cm) Weight: 209 lb (94.802 kg) IBW/kg (Calculated) : 70.7  Heparin Dosing Weight: 90 kg  Vital Signs: Temp: 98 F (36.7 C) (10/31 0458) Temp Source: Oral (10/31 0458) BP: 181/87 mmHg (10/31 0526) Pulse Rate: 62 (10/31 0458)  Labs:  Recent Labs  12/26/14 1120 12/27/14 1740 12/27/14 1839 12/28/14 0520 12/28/14 0521 12/28/14 0531 12/29/14 0452  HGB  --  15.3  --  12.3*  --   --   --   HCT  --  46.9  --  37.1*  --   --   --   PLT  --  87*  --  129*  --   --   --   LABPROT  --  18.1*  --  20.5*  --   --  27.1*  INR  --  1.49  --  1.76*  --   --  2.55*  HEPARINUNFRC 0.46 1.06* 0.83*  --  0.55  --  0.38  CREATININE  --  1.23  --  1.21  --   --   --   CKTOTAL 665*  --   --   --   --  315  --     Estimated Creatinine Clearance: 49.8 mL/min (by C-G formula based on Cr of 1.21).  Medications:  Infusions:  . heparin 1,050 Units/hr (12/29/14 8144)    Assessment: 23 yoM presented to ED on 10/24 with groin pain and headache.  PMH includes CAD s/p CABG, symptomatic bradycardia s/p pacemaker, CKD, DM2, DVT,  bioprosthetic aortic valve, and afib on chronic warfarin.  CT head no evidence of acute infarction, mass lesion, or intra- or extra-axial hemorrhage.  Warfarin was been placed on hold for possible lumbar puncture.  Pharmacy consulted to dose IV heparin while INR < 2 and resume warfarin dosing on 10/28.  Home warfarin dose reported as 2.5mg  daily (last dose taken 10/23).    INR was 1.9 at [facility] visit on 10/24, but was supratherapeutic with INR 4.38 on admission 10/25.  He has been therapeutic (or very near therapeutic) with stated home dose of warfarin 2.5mg  daily with  regular visits to [facility] Clinic since prior to 01/2014.  Supratherapeutic INR on admission may have been d/t acute infection.  Today, 12/29/2014:  INR therapeutic this morning, notable rate of rise overnight - most likely related to 10/29 dose of 5mg  x1.   Heparin level therapeutic this AM with infusion at 1050 units/hr.  CBC: Not done this morning, Hgb has been stable, pltc improving  Rn reports no bleeding or complications.  No major drug-drug interactions noted.  Goal of Therapy:  INR 2-3 Heparin level 0.3-0.7 units/ml Monitor platelets by anticoagulation protocol: Yes   Plan:   Suggest stop heparin infusion with INR > 2 - OK to stop per Dr Erlinda Hong  Warfarin 1.25mg  PO today based on INR rate of rise, anticipate able to resume 2.5mg  daily starting 12/30/2014.  CBC not done this am - order expired. Patient noted to be a hard stick - will not re-order since heparin likely stopping today  Daily INR  Doreene Eland, PharmD, BCPS.   Pager: [phone] 12/29/2014 7:32 AM

## 2014-12-29 NOTE — NC FL2 (Signed)
[place] LEVEL OF CARE SCREENING TOOL     IDENTIFICATION  Patient Name: [name] Birthdate: Apr 01, 1925 Sex: male Admission Date (Current Location): 12/22/2014  [facility] and [place] Number: Herbalist and Address:  [facility],  [place] [address], [place]      Provider Number: [id number]  Attending Physician Name and Address:  Florencia Reasons, MD  Relative Name and Phone Number:       Current Level of Care: Hospital Recommended Level of Care: [place] Prior Approval Number:    Date Approved/Denied:   PASRR Number:    Discharge Plan: SNF    Current Diagnoses: Patient Active Problem List   Diagnosis Date Noted  . Hip pain   . Groin pain   . Fever, unspecified 12/23/2014  . SIRS (systemic inflammatory response syndrome) ([place]) 12/23/2014  . Type 2 diabetes mellitus with vascular disease ([place]) 12/23/2014  . CKD (chronic kidney disease) stage 3, GFR 30-59 ml/min 12/23/2014  . Bilateral hip pain   . Elevated lactic acid level   . Pulmonary edema   . Acute respiratory failure with hypoxia ([place]) 10/03/2013  . Atypical pneumonia 10/03/2013  . Cellulitis of right lower extremity 10/03/2013  . Sepsis ([place]) 10/02/2013  . Hypotension, unspecified 10/02/2013  . AKI (acute kidney injury) ([place]) 10/02/2013  . Iron deficiency anemia 10/02/2013  . Warfarin-induced coagulopathy ([place]) 10/02/2013  . Essential hypertension, benign 08/06/2013  . Cardiac pacemaker in situ 08/06/2013  . Edema 08/06/2013  . Encounter for therapeutic drug monitoring 03/26/2013  . Diabetes mellitus ([place]) 11/19/2012  . Dermatophytosis of nail   . Gastroesophageal reflux disease   . Symptomatic bradycardia 10/04/2012  . Atrial fibrillation ([place])   . Coronary artery disease   . 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     Orientation ACTIVITIES/SOCIAL BLADDER RESPIRATION       Active Continent O2 (As needed) (3l)  BEHAVIORAL SYMPTOMS/MOOD  NEUROLOGICAL BOWEL NUTRITION STATUS      Continent Diet  PHYSICIAN VISITS COMMUNICATION OF NEEDS Height & Weight Skin    Verbally 5\' 9"  (175.3 cm) 209 lbs. Normal          AMBULATORY STATUS RESPIRATION      O2 (As needed) (3l)      Personal Care Assistance Level of Assistance               Functional Limitations Info                SPECIAL CARE FACTORS FREQUENCY  PT (By licensed PT), OT (By licensed OT)     PT Frequency: 5 OT Frequency: 5           Additional Factors Info  Code Status, Allergies Code Status Info: Full Code Allergies Info: Demerol           Current Medications (12/29/2014): Current Facility-Administered Medications  Medication Dose Route Frequency Provider Last Rate Last Dose  . acetaminophen (TYLENOL) tablet 650 mg  650 mg Oral Q6H PRN Rise Patience, MD   650 mg at 12/24/14 1154   Or  . acetaminophen (TYLENOL) suppository 650 mg  650 mg Rectal Q6H PRN Rise Patience, MD   650 mg at 12/23/14 0651  . ceFAZolin (ANCEF) IVPB 2 g/50 mL premix  2 g Intravenous 3 times per day Randa Spike, RPH   2 g at 12/29/14 1606  . insulin aspart (novoLOG) injection 0-9 Units  0-9 Units Subcutaneous TID WC Rise Patience,  MD   3 Units at 12/29/14 1148  . morphine 2 MG/ML injection 2 mg  2 mg Intravenous Q4H PRN Kelvin Cellar, MD   2 mg at 12/29/14 0930  . nitroGLYCERIN (NITROSTAT) SL tablet 0.4 mg  0.4 mg Sublingual Q5 Min x 3 PRN Rise Patience, MD      . ondansetron [facility]) tablet 4 mg  4 mg Oral Q6H PRN Rise Patience, MD       Or  . ondansetron [facility]) injection 4 mg  4 mg Intravenous Q6H PRN Rise Patience, MD      . oxyCODONE-acetaminophen (PERCOCET/ROXICET) 5-325 MG per tablet 1-2 tablet  1-2 tablet Oral Q6H PRN Jeryl Columbia, NP   2 tablet at 12/29/14 1148  . predniSONE (STERAPRED UNI-PAK 21 TAB) tablet 10 mg  10 mg Oral PC supper Polly Cobia, RPH      . [START ON 12/30/2014] predniSONE (STERAPRED  UNI-PAK 21 TAB) tablet 10 mg  10 mg Oral 3 x daily with food Polly Cobia, RPH      . [START ON 12/31/2014] predniSONE (STERAPRED UNI-PAK 21 TAB) tablet 10 mg  10 mg Oral 4X daily taper Polly Cobia, RPH      . predniSONE (STERAPRED UNI-PAK 21 TAB) tablet 20 mg  20 mg Oral Nightly Polly Cobia, RPH      . predniSONE (STERAPRED UNI-PAK 21 TAB) tablet 20 mg  20 mg Oral Nightly Drew A Wofford, RPH      . sodium chloride 0.9 % injection 3 mL  3 mL Intravenous Q12H Rise Patience, MD   3 mL at 12/28/14 2125  . warfarin (COUMADIN) tablet 1.25 mg  1.25 mg Oral ONCE-1800 Berton Mount, RPH      . Warfarin - Pharmacist Dosing Inpatient   Does not apply q1800 Randa Spike, RPH   0  at 12/26/14 1800   Do not use this list as official medication orders. Please verify with discharge summary.  Discharge Medications:   Medication List    ASK your doctor about these medications        CO Q 10 PO  Take 1 capsule by mouth daily.     furosemide 20 MG tablet  Commonly known as:  LASIX  Take 20 mg by mouth every other day.     glimepiride 1 MG tablet  Commonly known as:  AMARYL  Take 1 mg by mouth 2 (two) times daily.     nitroGLYCERIN 0.4 MG SL tablet  Commonly known as:  NITROSTAT  Place 1 tablet (0.4 mg total) under the tongue every 5 (five) minutes x 3 doses as needed for chest pain.     ramipril 2.5 MG capsule  Commonly known as:  ALTACE  Take 1 capsule (2.5 mg total) by mouth daily.     warfarin 5 MG tablet  Commonly known as:  COUMADIN  Take as directed by coumadin clinic        Relevant Imaging Results:  Relevant Lab Results:  Recent Labs    Additional Information SSN [id number]  Ludwig Clarks, LCSW

## 2014-12-30 ENCOUNTER — Inpatient Hospital Stay (HOSPITAL_COMMUNITY): Payer: Medicare Other

## 2014-12-30 DIAGNOSIS — E119 Type 2 diabetes mellitus without complications: Secondary | ICD-10-CM

## 2014-12-30 DIAGNOSIS — N183 Chronic kidney disease, stage 3 (moderate): Secondary | ICD-10-CM | POA: Diagnosis not present

## 2014-12-30 DIAGNOSIS — I10 Essential (primary) hypertension: Secondary | ICD-10-CM | POA: Diagnosis not present

## 2014-12-30 DIAGNOSIS — M25551 Pain in right hip: Secondary | ICD-10-CM | POA: Diagnosis not present

## 2014-12-30 DIAGNOSIS — Z950 Presence of cardiac pacemaker: Secondary | ICD-10-CM | POA: Diagnosis not present

## 2014-12-30 DIAGNOSIS — M6281 Muscle weakness (generalized): Secondary | ICD-10-CM | POA: Diagnosis not present

## 2014-12-30 DIAGNOSIS — Z7901 Long term (current) use of anticoagulants: Secondary | ICD-10-CM | POA: Diagnosis not present

## 2014-12-30 DIAGNOSIS — I2510 Atherosclerotic heart disease of native coronary artery without angina pectoris: Secondary | ICD-10-CM | POA: Diagnosis not present

## 2014-12-30 DIAGNOSIS — E1159 Type 2 diabetes mellitus with other circulatory complications: Secondary | ICD-10-CM | POA: Diagnosis not present

## 2014-12-30 DIAGNOSIS — R29898 Other symptoms and signs involving the musculoskeletal system: Secondary | ICD-10-CM | POA: Diagnosis not present

## 2014-12-30 DIAGNOSIS — N179 Acute kidney failure, unspecified: Secondary | ICD-10-CM | POA: Diagnosis not present

## 2014-12-30 DIAGNOSIS — N481 Balanitis: Secondary | ICD-10-CM | POA: Diagnosis not present

## 2014-12-30 DIAGNOSIS — Z960 Presence of urogenital implants: Secondary | ICD-10-CM | POA: Diagnosis not present

## 2014-12-30 DIAGNOSIS — J811 Chronic pulmonary edema: Secondary | ICD-10-CM | POA: Diagnosis not present

## 2014-12-30 DIAGNOSIS — B9561 Methicillin susceptible Staphylococcus aureus infection as the cause of diseases classified elsewhere: Secondary | ICD-10-CM | POA: Diagnosis not present

## 2014-12-30 DIAGNOSIS — R1030 Lower abdominal pain, unspecified: Secondary | ICD-10-CM | POA: Diagnosis not present

## 2014-12-30 DIAGNOSIS — E1122 Type 2 diabetes mellitus with diabetic chronic kidney disease: Secondary | ICD-10-CM | POA: Diagnosis not present

## 2014-12-30 DIAGNOSIS — R509 Fever, unspecified: Secondary | ICD-10-CM | POA: Diagnosis not present

## 2014-12-30 DIAGNOSIS — I480 Paroxysmal atrial fibrillation: Secondary | ICD-10-CM | POA: Diagnosis not present

## 2014-12-30 DIAGNOSIS — A4189 Other specified sepsis: Secondary | ICD-10-CM | POA: Diagnosis not present

## 2014-12-30 DIAGNOSIS — N320 Bladder-neck obstruction: Secondary | ICD-10-CM | POA: Diagnosis not present

## 2014-12-30 DIAGNOSIS — A419 Sepsis, unspecified organism: Secondary | ICD-10-CM | POA: Diagnosis not present

## 2014-12-30 DIAGNOSIS — Z452 Encounter for adjustment and management of vascular access device: Secondary | ICD-10-CM | POA: Diagnosis not present

## 2014-12-30 DIAGNOSIS — I959 Hypotension, unspecified: Secondary | ICD-10-CM | POA: Diagnosis not present

## 2014-12-30 DIAGNOSIS — M25552 Pain in left hip: Secondary | ICD-10-CM | POA: Diagnosis not present

## 2014-12-30 DIAGNOSIS — M79651 Pain in right thigh: Secondary | ICD-10-CM | POA: Diagnosis not present

## 2014-12-30 DIAGNOSIS — B9562 Methicillin resistant Staphylococcus aureus infection as the cause of diseases classified elsewhere: Secondary | ICD-10-CM | POA: Diagnosis not present

## 2014-12-30 DIAGNOSIS — E1129 Type 2 diabetes mellitus with other diabetic kidney complication: Secondary | ICD-10-CM | POA: Diagnosis not present

## 2014-12-30 DIAGNOSIS — A4101 Sepsis due to Methicillin susceptible Staphylococcus aureus: Secondary | ICD-10-CM | POA: Diagnosis not present

## 2014-12-30 DIAGNOSIS — R627 Adult failure to thrive: Secondary | ICD-10-CM | POA: Diagnosis not present

## 2014-12-30 DIAGNOSIS — I130 Hypertensive heart and chronic kidney disease with heart failure and stage 1 through stage 4 chronic kidney disease, or unspecified chronic kidney disease: Secondary | ICD-10-CM | POA: Diagnosis not present

## 2014-12-30 DIAGNOSIS — E872 Acidosis: Secondary | ICD-10-CM | POA: Diagnosis not present

## 2014-12-30 DIAGNOSIS — R7881 Bacteremia: Secondary | ICD-10-CM | POA: Diagnosis not present

## 2014-12-30 LAB — CULTURE, BLOOD (ROUTINE X 2)
CULTURE: NO GROWTH
CULTURE: NO GROWTH

## 2014-12-30 LAB — BASIC METABOLIC PANEL
ANION GAP: 8 (ref 5–15)
BUN: 25 mg/dL — AB (ref 6–20)
CALCIUM: 8.7 mg/dL — AB (ref 8.9–10.3)
CHLORIDE: 106 mmol/L (ref 101–111)
CO2: 24 mmol/L (ref 22–32)
Creatinine, Ser: 1.07 mg/dL (ref 0.61–1.24)
GLUCOSE: 218 mg/dL — AB (ref 65–99)
POTASSIUM: 4.5 mmol/L (ref 3.5–5.1)
SODIUM: 138 mmol/L (ref 135–145)

## 2014-12-30 LAB — GLUCOSE, CAPILLARY
Glucose-Capillary: 241 mg/dL — ABNORMAL HIGH (ref 65–99)
Glucose-Capillary: 183 mg/dL — ABNORMAL HIGH (ref 65–99)
Glucose-Capillary: 199 mg/dL — ABNORMAL HIGH (ref 65–99)
Glucose-Capillary: 312 mg/dL — ABNORMAL HIGH (ref 65–99)

## 2014-12-30 LAB — CBC
HCT: 37.7 % — ABNORMAL LOW (ref 39.0–52.0)
Hemoglobin: 12.2 g/dL — ABNORMAL LOW (ref 13.0–17.0)
MCH: 31.3 pg (ref 26.0–34.0)
MCHC: 32.4 g/dL (ref 30.0–36.0)
MCV: 96.7 fL (ref 78.0–100.0)
Platelets: 204 10*3/uL (ref 150–400)
RBC: 3.9 MIL/uL — AB (ref 4.22–5.81)
RDW: 14.3 % (ref 11.5–15.5)
WBC: 9.3 10*3/uL (ref 4.0–10.5)

## 2014-12-30 LAB — PROTIME-INR
INR: 3.6 — AB (ref 0.00–1.49)
PROTHROMBIN TIME: 35.1 s — AB (ref 11.6–15.2)

## 2014-12-30 MED ORDER — SENNOSIDES-DOCUSATE SODIUM 8.6-50 MG PO TABS
1.0000 | ORAL_TABLET | Freq: Two times a day (BID) | ORAL | Status: DC
  Administered 2014-12-30: 1 via ORAL
  Filled 2014-12-30: qty 1

## 2014-12-30 MED ORDER — HEPARIN SOD (PORK) LOCK FLUSH 100 UNIT/ML IV SOLN
250.0000 [IU] | Freq: Every day | INTRAVENOUS | Status: DC
  Filled 2014-12-30: qty 3

## 2014-12-30 MED ORDER — PREDNISONE 10 MG (21) PO TBPK: 10.0000 mg | ORAL_TABLET | Freq: Every day | ORAL | Status: DC

## 2014-12-30 MED ORDER — TAMSULOSIN HCL 0.4 MG PO CAPS
0.4000 mg | ORAL_CAPSULE | Freq: Every day | ORAL | Status: DC
  Administered 2014-12-30: 0.4 mg via ORAL
  Filled 2014-12-30: qty 1

## 2014-12-30 MED ORDER — HEPARIN SOD (PORK) LOCK FLUSH 100 UNIT/ML IV SOLN
250.0000 [IU] | INTRAVENOUS | Status: DC | PRN
  Administered 2014-12-30: 250 [IU]
  Filled 2014-12-30: qty 3

## 2014-12-30 MED ORDER — SODIUM CHLORIDE 0.9 % IJ SOLN
10.0000 mL | INTRAMUSCULAR | Status: DC | PRN
Start: 2014-12-30 — End: 2014-12-30
  Administered 2014-12-30: 10 mL
  Filled 2014-12-30: qty 40

## 2014-12-30 MED ORDER — SENNOSIDES-DOCUSATE SODIUM 8.6-50 MG PO TABS: 1.0000 | ORAL_TABLET | Freq: Two times a day (BID) | ORAL | Status: DC

## 2014-12-30 MED ORDER — OXYCODONE-ACETAMINOPHEN 5-325 MG PO TABS: 1.0000 | ORAL_TABLET | Freq: Four times a day (QID) | ORAL | Status: DC | PRN

## 2014-12-30 MED ORDER — TAMSULOSIN HCL 0.4 MG PO CAPS: 0.4000 mg | ORAL_CAPSULE | Freq: Every day | ORAL | Status: DC

## 2014-12-30 MED ORDER — CEFAZOLIN SODIUM-DEXTROSE 2-3 GM-% IV SOLR: 2.0000 g | Freq: Three times a day (TID) | INTRAVENOUS | Status: DC

## 2014-12-30 NOTE — Progress Notes (Signed)
Peripherally Inserted Central Catheter/Midline Placement  The IV Nurse has discussed with the patient and/or persons authorized to consent for the patient, the purpose of this procedure and the potential benefits and risks involved with this procedure.  The benefits include less needle sticks, lab draws from the catheter and patient may be discharged home with the catheter.  Risks include, but not limited to, infection, bleeding, blood clot (thrombus formation), and puncture of an artery; nerve damage and irregular heat beat.  Alternatives to this procedure were also discussed.  PICC/Midline Placement Documentation  PICC / Midline Single Lumen 37/36/68 PICC Right Basilic 43 cm 0 cm (Active)  Indication for Insertion or Continuance of Line Home intravenous therapies (PICC only) 12/30/2014 12:00 PM  Exposed Catheter (cm) 0 cm 12/30/2014 12:00 PM  Dressing Change Due 01/06/15 12/30/2014 12:00 PM       Christella Noa Albarece 12/30/2014, 12:33 PM

## 2014-12-30 NOTE — Discharge Summary (Addendum)
Discharge Summary  BAYANI RENTERIA VFI:[id number] DOB: [birth year]  PCP: Wenda Low, MD  Admit date: 12/22/2014 Discharge date: 12/30/2014  Time spent: >66mins  Recommendations for Outpatient Follow-up:  1.check INR daily till INR stable between 2-3, adjust coumadin dose accordingly 2. Total of 4 weeks of IV cefazolin through November 23rd 3. Weekly cbc, cmp to [facility] 4.  follow up in [facility] prior to completion, keep picc in until seen by ID 5   f/u with cardiology/EP, s/p aortic valve replacement (2006), s/p pacemaker (2014)  Discharge Diagnoses:  Active Hospital Problems   Diagnosis Date Noted  . Sepsis ([place]) 10/02/2013  . FTT (failure to thrive) in adult   . Hip pain   . Groin pain   . Fever, unspecified 12/23/2014  . Type 2 diabetes mellitus with vascular disease ([place]) 12/23/2014  . CKD (chronic kidney disease) stage 3, GFR 30-59 ml/min 12/23/2014  . Bilateral hip pain   . Elevated lactic acid level   . Pulmonary edema   . Essential hypertension, benign 08/06/2013  . Cardiac pacemaker in situ 08/06/2013    Resolved Hospital Problems   Diagnosis Date Noted Date Resolved  No resolved problems to display.    Discharge Condition: stable  Diet recommendation: heart healthy/carb modified  Filed Weights   12/22/14 2200 12/25/14 0855  Weight: 209 lb (94.802 kg) 209 lb (94.802 kg)    History of present illness:  [name] is a 81 y.o. male with history of CAD status post CABG, bioprosthetic aortic valve, A. fib, symptomatic bradycardia status post pacemaker, chronic kidney disease, diabetes mellitus type 2 was brought to the ER the patient was having worsening groin pain and headaches. As per patient's wife who provided the history patient has had left groin pains since afternoon yesterday and started developing headaches. Patient started having rigors with chills. Since patient's symptoms persisted patient came to the ER late night. In the ER patient was found to have  a fever of 104F and CT abdomen and pelvis done in the ER did not show anything acute. Scrotal ultrasound also was unremarkable. UA and chest x-ray was showing nothing acute. Patient's blood cultures were obtained and initially was given fluid boluses but as patient became fluid overloaded had to be given Lasix. On exam patient is medically confused after receiving morphine. Patient's headache mostly his frontal. Patient also had some nausea denies abdominal pain or diarrhea as per the patient's wife. Patient did not have any chest pain or shortness of breath or any productive cough. Denies any recent travel or sick contacts.   Hospital Course:  Principal Problem:   Sepsis ([place]) Active Problems:   Essential hypertension, benign   Cardiac pacemaker in situ   Fever, unspecified   Type 2 diabetes mellitus with vascular disease ([facility])   CKD (chronic kidney disease) stage 3, GFR 30-59 ml/min   Bilateral hip pain   Elevated lactic acid level   Pulmonary edema   Groin pain   Hip pain   FTT (failure to thrive) in adult   Sepsis /MSSA bacteremia -Present on admission, evidenced by a temperature of 104.8, with respiratory rate of 38, blood pressures of 83/40, lactic acid of 2.7, having positive blood cultures. -Workup this far has included abdominal and pelvis CT that did not reveal acute intra-abdominal pathology. Radiology reported market elevation of the left hemi-diaphragm with displacement of the stomach head and proximal body of the pancreas. No evidence of acute infection. Scrotal ultrasound did not show evidence for testicular torsion. -  Patient undergoing transesophageal echocardiogram on 12/25/2014 which did not show evidence of vegetation. Bioprosthetic valve well seated. -Blood cultures drawn on admission growing methicillin sensitive Staphylococcus aureus from both sets. -Case was discussed with infectious disease, his vancomycin was discontinued as he was started on cefazolin -Blood  cultures were repeated on 12/25/2014 -He was further worked up with a CT scan of pelvis and lumbar spine which did not show evidence of acute infectious process.  -Infectious disease who recommended 4 weeks of IV antibiotic therapy with cefazolin.  -Repeat blood cultures were obtained on 12/25/2014, remain negative, place PICC line on 12/29/2014 in anticipation of total of 4 weeks of IV AB therapy till 11/23. Need to see ID prior to this, keep picc in until seen by ID.    2. History of aortic valve replacement with by prostatic valve -As mentioned above patient presenting with sepsis, having blood cultures positive for gram-positive cocci 2 sets. -Transesophageal echocardiogram performed on 12/25/2014 did not show evidence of vegetations. Study showed by prostatic aortic valve well-seated. -There is no evidence of acute CHF. He does not appear to have acute cardiac pulmonary issues. -coumadin restarted on 10/31, inr therapeutic -inr 3.6. On 11/1, coumadin held on 11/1, SNF to repeat INR, adjust coumadin accordingly.  3. Type 2 diabetes mellitus -His blood sugars remain stable in the 85-107 range, a1c pending -Continue Accu-Cheks with sliding scale coverage -resume home meds Amaryl  4. Bilateral groin pain. -Patient complaining of pain over bilateral groin and inner thigh region. -On physical examination he did not appear to have bulging mass which would suggest hernia. He did have pain with internal rotation of his leg. I wonder about the possibility of symptoms being secondary to osteoarthritis. Other possibilities would include lumbar stenosis or bursitis although unlikely. -CT scan of pelvis and lumbar spine performed on 12/25/2014. He was found to have multilevel osseous arthritic changes of the lower lumbar sacral spine. There was no evidence of fracture or subluxation of bilateral hips or lumbosacral spine.  -Unclear where his thigh/groin pain is coming from, he does not appear to  have pain with palpation over that region. Doubt this would be reflective of myositis. May be related to OA.  -I started him on steroids on 12/26/2014, treated him with prednisone 60 mg by mouth daily, having subsequent clinical improvement -On 12/28/2014 patient showing progress as we were able to assist him out of bed and ambulate him down the hallway -taper  oral steroids.   5. Hypertension. -Patient having a history of hypertension however since he presented with hypotension in setting of sepsis antihypertensive agents were discontinued Blood pressures remain stable without any bp meds during hospitalization. -Home meds lisinopril 2.5mg  po every other day discontinued.  6. History of atrial fibrillation CHADSVasc score of 3 -He presented with a supratherapeutic INR 4.38 for which Coumadin has been held. -INR 1.76 on 12/28/2014 -Consulted pharmacy for Coumadin management. He is currently bridged with IV heparin.  -inr therapeutic, d/c heparin drip on 10/31. Coumadin restarted on 10/31 -INR 3.6 on 11/1, coumadin held, repeat INR in am, adjust coumadin dose accordingly.  7. Chronic diastolic chf, stable, continue home dose lasix  8. H/o BPH, not on any meds at home, reported chronic urinary hesitancy, he has a foley catheter placed in during hospitalization, voiding trial prior to discharge, started on flomax, consider outpatient urology follow up.  Code Status: Full code Family Communication: patient Disposition Plan: SNF placement when bed available in 1-2 days   Consultants:  Critical care medicine  Cardiology  Infectious disease  Antibiotics:  Vancomycin stopped on 12/25/2014   Ceftriaxone stopped on 12/25/2014  Cefazolin started on 12/25/2014   Discharge Exam: BP 154/75 mmHg  Pulse 59  Temp(Src) 98.1 F (36.7 C) (Oral)  Resp 20  Ht 5\' 9"  (1.753 m)  Wt 209 lb (94.802 kg)  BMI 30.85 kg/m2  SpO2 97%   General: Patient is nontoxic appearing, awake and  alert in no acute distress. He was assisted out of bed and ambulated  Cardiovascular: Regular rate and rhythm normal S1-S2 no murmurs rubs or gallops  Respiratory: Normal respiratory effort, lungs are clear to auscultation bilaterally  Abdomen: Soft nontender nontender positive bowel sounds  Musculoskeletal: Pain symptoms involving b/l thighs improved, chronic lower extremity pitting edema, left worse than right.   Discharge Instructions You were cared for by a hospitalist during your hospital stay. If you have any questions about your discharge medications or the care you received while you were in the hospital after you are discharged, you can call the unit and asked to speak with the hospitalist on call if the hospitalist that took care of you is not available. Once you are discharged, your primary care physician will handle any further medical issues. Please note that NO REFILLS for any discharge medications will be authorized once you are discharged, as it is imperative that you return to your primary care physician (or establish a relationship with a primary care physician if you do not have one) for your aftercare needs so that they can reassess your need for medications and monitor your lab values.      Discharge Instructions    Diet - low sodium heart healthy    Complete by:  As directed   Carb modifed     Increase activity slowly    Complete by:  As directed             Medication List    TAKE these medications        ceFAZolin 2-3 GM-% Solr  Commonly known as:  ANCEF  Inject 50 mLs (2 g total) into the vein every 8 (eight) hours. Last dose on 11/23     CO Q 10 PO  Take 1 capsule by mouth daily.     furosemide 20 MG tablet  Commonly known as:  LASIX  Take 20 mg by mouth every other day.     glimepiride 1 MG tablet  Commonly known as:  AMARYL  Take 1 mg by mouth 2 (two) times daily.     nitroGLYCERIN 0.4 MG SL tablet  Commonly known as:  NITROSTAT  Place 1  tablet (0.4 mg total) under the tongue every 5 (five) minutes x 3 doses as needed for chest pain.     oxyCODONE-acetaminophen 5-325 MG tablet  Commonly known as:  PERCOCET/ROXICET  Take 1-2 tablets by mouth every 6 (six) hours as needed for moderate pain.     predniSONE 10 MG (21) Tbpk tablet  Commonly known as:  STERAPRED UNI-PAK 21 TAB  Take 1 tablet (10 mg total) by mouth daily. Take by mouth with breakfast, 50mg  on day 1, 40mg  on day 2, 30mg  on day 3, 20mg  on day 4, 10mg  on day 5, then stop.     ramipril 2.5 MG capsule  Commonly known as:  ALTACE  Take 1 capsule (2.5 mg total) by mouth daily.     senna-docusate 8.6-50 MG tablet  Commonly known as:  Senokot-S  Take 1  tablet by mouth 2 (two) times daily.     tamsulosin 0.4 MG Caps capsule  Commonly known as:  FLOMAX  Take 1 capsule (0.4 mg total) by mouth daily.     warfarin 5 MG tablet  Commonly known as:  COUMADIN  Take as directed by coumadin clinic       Allergies  Allergen Reactions  . Demerol [Meperidine] Other (See Comments)    "hallucinations"   Follow-up Information    Follow up with [facility] On 01/19/2015.   Specialty:  Infectious Diseases   Why:  mssa bacteremia on iv abx   Contact information:   [place], [place] [id number] [place] [phone]       The results of significant diagnostics from this hospitalization (including imaging, microbiology, ancillary and laboratory) are listed below for reference.    Significant Diagnostic Studies: Ct Abdomen Pelvis Wo Contrast  12/23/2014  CLINICAL DATA:  Acute onset of left inguinal pain. Initial encounter. EXAM: CT ABDOMEN AND PELVIS WITHOUT CONTRAST TECHNIQUE: Multidetector CT imaging of the abdomen and pelvis was performed following the standard protocol without IV contrast. COMPARISON:  MRI of the lumbar spine performed 11/24/2006 FINDINGS: Left basilar atelectasis is  noted. Scattered coronary artery calcifications are seen. Postoperative change is noted at the aortic valve. The patient is status post median sternotomy. A pacemaker lead is partially imaged. There is marked elevation of the left hemidiaphragm, with displacement of the stomach, head and proximal body of the pancreas, small and large bowel loops and spleen superiorly. Surrounding postoperative change is noted. There is rightward displacement of the heart and distal descending thoracic aorta. The liver and spleen are unremarkable in appearance. The gallbladder is within normal limits. The pancreas and adrenal glands are unremarkable. A 1.2 cm cyst is noted at the interpole region of the right kidney. Nonspecific perinephric stranding is noted bilaterally, more prominent on the right. There is no evidence of hydronephrosis. A 3 mm stone is noted at the interpole region of the left kidney. No obstructing ureteral stones are seen. No free fluid is identified. The small bowel is unremarkable in appearance. The stomach is within normal limits. No acute vascular abnormalities are seen. Scattered calcification is noted along the abdominal aorta and its branches. The appendix is normal in caliber, without evidence of appendicitis. The colon is grossly unremarkable in appearance. The bladder is mildly distended and grossly unremarkable. The prostate remains normal in size. No inguinal lymphadenopathy is seen. The left inguinal region is unremarkable in appearance. No acute osseous abnormalities are identified. IMPRESSION: 1. Left inguinal region is unremarkable in appearance. 2. Marked elevation of the left hemidiaphragm, with displacement of the stomach, head and proximal body of the pancreas, small and large bowel loops and spleen superiorly. Surrounding postoperative change noted. Rightward displacement of the heart and distal descending thoracic aorta. 3. Small right renal cyst noted. 3 mm nonobstructing stone at the  interpole region of the left kidney. 4. Scattered calcification along the abdominal aorta and its branches. 5. Left basilar atelectasis noted. 6. Scattered coronary artery calcifications seen. Electronically Signed   By: Garald Balding M.D.   On: 12/23/2014 01:33   Ct Head Wo Contrast  12/23/2014  CLINICAL DATA:  Acute onset of headache and fever. Altered mental status. Initial encounter. EXAM: CT HEAD WITHOUT CONTRAST TECHNIQUE: Contiguous axial images were obtained from the base of the skull through the vertex without intravenous contrast. COMPARISON:  None. FINDINGS:  There is no evidence of acute infarction, mass lesion, or intra- or extra-axial hemorrhage on CT. Prominence of the sulci suggests mild cortical volume loss. Scattered periventricular and subcortical white matter change likely reflects small vessel ischemic microangiopathy. Cerebellar atrophy is noted. The brainstem and fourth ventricle are within normal limits. The basal ganglia are unremarkable in appearance. The cerebral hemispheres demonstrate grossly normal gray-white differentiation. No mass effect or midline shift is seen. There is no evidence of fracture; visualized osseous structures are unremarkable in appearance. The orbits are within normal limits. There is partial opacification of the right side of the sphenoid sinus. The remaining paranasal sinuses and mastoid air cells are well-aerated. No significant soft tissue abnormalities are seen. IMPRESSION: 1. No acute intracranial pathology seen on CT. 2. Mild cortical volume loss and scattered small vessel ischemic microangiopathy. 3. Partial opacification of the right side of the sphenoid sinus. Electronically Signed   By: Garald Balding M.D.   On: 12/23/2014 05:58   Ct Lumbar Spine Wo Contrast  12/25/2014  CLINICAL DATA:  81 year old patient with sepsis, who complains of bilateral groin and inner thigh pain. EXAM: CT LUMBAR SPINE WITHOUT CONTRAST CT PELVIS WITHOUT CONTRAST  TECHNIQUE: Multidetector CT imaging of the lumbar spine was performed without intravenous contrast administration. Multiplanar CT image reconstructions were also generated. COMPARISON:  None. FINDINGS: PELVIS: Urinary bladder has normal appearance. The prostate gland is mildly enlarged, abutting the posterior wall of the urinary bladder. The visualized portion of small and large bowel loops have normal caliber and distribution. There is no evidence of lymphadenopathy, free fluid or free gas within the pelvis. Evaluation of the osseous structures demonstrates no evidence of fracture, dislocation or significant effusion of the hip joints. There is mild synovial thickening of the left hip joint, without CT evidence of an effusion. Mild osteoarthritic changes are seen bilaterally. Evaluation of the lumbosacral spine demonstrates no evidence of fracture or subluxation. Findings of diffuse idiopathic skeletal hyperostosis a seen in the lower thoracic spine. There are moderate multilevel osteoarthritic changes of the lumbosacral spine. There is levoconvex scoliosis. Ankylosing of right-sided osteophytes are noted at the levels of T12-L1, L1-L2 and L2-L3. Posterior facet arthropathy is also seen in the lower lumbosacral spine. There is mild narrowing of the bony neural foramina bilaterally at L5-S1. Deformity of right twelfth rib is likely due to healed fracture. IMPRESSION: No evidence of fracture or subluxation of bilateral hips, or lumbosacral spine. No evidence of sizable hip joint effusions. Multilevel osteoarthritic changes of the lower lumbosacral spine. Prominence of the prostate gland. Correlation to serum PSA may be considered. Electronically Signed   By: Fidela Salisbury M.D.   On: 12/25/2014 18:18   Ct Pelvis Wo Contrast  12/25/2014  CLINICAL DATA:  81 year old patient with sepsis, who complains of bilateral groin and inner thigh pain. EXAM: CT LUMBAR SPINE WITHOUT CONTRAST CT PELVIS WITHOUT CONTRAST  TECHNIQUE: Multidetector CT imaging of the lumbar spine was performed without intravenous contrast administration. Multiplanar CT image reconstructions were also generated. COMPARISON:  None. FINDINGS: PELVIS: Urinary bladder has normal appearance. The prostate gland is mildly enlarged, abutting the posterior wall of the urinary bladder. The visualized portion of small and large bowel loops have normal caliber and distribution. There is no evidence of lymphadenopathy, free fluid or free gas within the pelvis. Evaluation of the osseous structures demonstrates no evidence of fracture, dislocation or significant effusion of the hip joints. There is mild synovial thickening of the left hip joint, without CT evidence of an  effusion. Mild osteoarthritic changes are seen bilaterally. Evaluation of the lumbosacral spine demonstrates no evidence of fracture or subluxation. Findings of diffuse idiopathic skeletal hyperostosis a seen in the lower thoracic spine. There are moderate multilevel osteoarthritic changes of the lumbosacral spine. There is levoconvex scoliosis. Ankylosing of right-sided osteophytes are noted at the levels of T12-L1, L1-L2 and L2-L3. Posterior facet arthropathy is also seen in the lower lumbosacral spine. There is mild narrowing of the bony neural foramina bilaterally at L5-S1. Deformity of right twelfth rib is likely due to healed fracture. IMPRESSION: No evidence of fracture or subluxation of bilateral hips, or lumbosacral spine. No evidence of sizable hip joint effusions. Multilevel osteoarthritic changes of the lower lumbosacral spine. Prominence of the prostate gland. Correlation to serum PSA may be considered. Electronically Signed   By: Fidela Salisbury M.D.   On: 12/25/2014 18:18   US Scrotum  12/23/2014  CLINICAL DATA:  Acute onset of groin pain.  Initial encounter. EXAM: SCROTAL ULTRASOUND DOPPLER ULTRASOUND OF THE TESTICLES TECHNIQUE: Complete ultrasound examination of the testicles,  epididymis, and other scrotal structures was performed. Color and spectral Doppler ultrasound were also utilized to evaluate blood flow to the testicles. COMPARISON:  None. FINDINGS: Right testicle Measurements: 3.8 x 2.1 x 2.8 cm. No mass or microlithiasis visualized. Left testicle Measurements: 4.2 x 2.3 x 2.6 cm. No mass or microlithiasis visualized. Right epididymis: A septated 1.4 x 0.9 x 0.9 cm cyst is noted at the body of the right epididymis. The right epididymis is otherwise unremarkable. Left epididymis: A 1.6 x 0.8 x 1.3 cm septated cyst is noted at the left epididymal tail. The left epididymis is otherwise unremarkable. Hydrocele:  None visualized. Varicocele:  None visualized. Pulsed Doppler interrogation of both testes demonstrates normal low resistance arterial and venous waveforms bilaterally. IMPRESSION: 1. No evidence of testicular torsion. 2. Septated bilateral epididymal cysts noted, measuring 1.4 cm on the right and 1.6 cm on the left. Epididymides otherwise unremarkable. Electronically Signed   By: Garald Balding M.D.   On: 12/23/2014 04:18   [place] Art/ven Flow Abd Pelv Doppler  12/23/2014  CLINICAL DATA:  Acute onset of groin pain.  Initial encounter. EXAM: SCROTAL ULTRASOUND DOPPLER ULTRASOUND OF THE TESTICLES TECHNIQUE: Complete ultrasound examination of the testicles, epididymis, and other scrotal structures was performed. Color and spectral Doppler ultrasound were also utilized to evaluate blood flow to the testicles. COMPARISON:  None. FINDINGS: Right testicle Measurements: 3.8 x 2.1 x 2.8 cm. No mass or microlithiasis visualized. Left testicle Measurements: 4.2 x 2.3 x 2.6 cm. No mass or microlithiasis visualized. Right epididymis: A septated 1.4 x 0.9 x 0.9 cm cyst is noted at the body of the right epididymis. The right epididymis is otherwise unremarkable. Left epididymis: A 1.6 x 0.8 x 1.3 cm septated cyst is noted at the left epididymal tail. The left epididymis is otherwise  unremarkable. Hydrocele:  None visualized. Varicocele:  None visualized. Pulsed Doppler interrogation of both testes demonstrates normal low resistance arterial and venous waveforms bilaterally. IMPRESSION: 1. No evidence of testicular torsion. 2. Septated bilateral epididymal cysts noted, measuring 1.4 cm on the right and 1.6 cm on the left. Epididymides otherwise unremarkable. Electronically Signed   By: Garald Balding M.D.   On: 12/23/2014 04:18   Dg Chest Port 1 View  12/30/2014  CLINICAL DATA:  81 year old male with line placement. Subsequent encounter. EXAM: PORTABLE CHEST 1 VIEW COMPARISON:  12/24/2014. FINDINGS: Right PICC line placed with tip projecting at the level of the distal  superior vena cava. No pneumothorax. Pacemaker unchanged in position. Post CABG and valve replacement. Cardiomegaly. Pulmonary vascular congestion. Elevated left hemidiaphragm as noted on prior CT. This limits evaluation of left lung base. IMPRESSION: Right PICC line tip distal superior vena cava level. Please see above. Electronically Signed   By: Genia Del M.D.   On: 12/30/2014 12:57   Dg Chest Port 1 View  12/24/2014  CLINICAL DATA:  Pulmonary edema, history of previous CABG and aortic valve replacement EXAM: PORTABLE CHEST 1 VIEW COMPARISON:  Portable chest x-ray of December 23, 2014 FINDINGS: The cardiopericardial silhouette remains enlarged. The prosthetic aortic valve cage is visible. The left hemidiaphragm remains obscured. The pulmonary interstitial markings remain increased but are slightly less conspicuous today. Left lower lobe atelectasis or pneumonia is present and stable. Mild elevation of the left hemidiaphragm is suspected. The permanent pacemaker is unchanged in position. There are 8 intact sternal wires from previous median sternotomy. IMPRESSION: Slight interval improvement in pulmonary interstitial edema secondary to CHF. Persistent left lower lobe atelectasis and/or pneumonia. Electronically Signed    By: David  Martinique M.D.   On: 12/24/2014 07:08   Dg Chest Port 1 View  12/23/2014  CLINICAL DATA:  Acute onset of worsening shortness of breath. Initial encounter. EXAM: PORTABLE CHEST 1 VIEW COMPARISON:  Chest radiograph performed 12/22/2014 FINDINGS: Increased vascular congestion is noted, with bilateral central airspace opacities and a moderate left-sided pleural effusion. Findings are compatible with worsening pulmonary edema. No pneumothorax is seen. The cardiomediastinal silhouette is enlarged. The patient is status post median sternotomy. A pacemaker is noted overlying the left chest wall, with a single lead ending overlying the right ventricle. Evaluation is suboptimal due to patient rotation. No acute osseous abnormalities are identified. IMPRESSION: Increased vascular congestion and cardiomegaly, with bilateral central airspace opacities and a moderate left-sided pleural effusion. Findings are compatible with worsening pulmonary edema. Electronically Signed   By: Garald Balding M.D.   On: 12/23/2014 03:46   Dg Chest Port 1 View  12/22/2014  CLINICAL DATA:  81 year old male with fever at 5 p.m. today. Currently being treated for kidney stones. EXAM: PORTABLE CHEST 1 VIEW COMPARISON:  Chest x-ray 06/2013 FINDINGS: Elevation of left hemidiaphragm. Opacities at the left lung base may reflect atelectasis and/or scarring, and are similar to the prior examinations. Possible small chronic left pleural effusion. Right lung appears clear. Mild diffuse coarsening of interstitial markings appears to be chronic. Mild cephalization of the pulmonary vasculature. Heart size is mildly enlarged. The patient is rotated to the left on today's exam, resulting in distortion of the mediastinal contours and reduced diagnostic sensitivity and specificity for mediastinal pathology. Atherosclerosis in the thoracic aorta. Status post median sternotomy for CABG and aortic valve replacement (a stented bioprosthesis is noted).  Left-sided pacemaker device in position with lead tip projecting over the expected location of the right ventricular apex. IMPRESSION: 1. The appearance the chest again suggests mild congestive heart failure, as above. 2. Chronic elevation of left hemidiaphragm is unchanged and again associated with extensive areas of chronic scarring or subsegmental atelectasis in the left lower lobe. Probable small chronic left pleural effusion also noted. 3. Atherosclerosis. 4. Postoperative changes and support apparatus, as above. Electronically Signed   By: Vinnie Langton M.D.   On: 12/22/2014 23:50   Dg Hips Bilat With Pelvis 2v  12/23/2014  CLINICAL DATA:  Bilateral hip pain, more severe on the left than on the right. No history of recent trauma EXAM: DG HIP (WITH OR  WITHOUT PELVIS) 2V EACH HIP COMPARISON:  None. FINDINGS: Frontal pelvis as well as frontal and lateral views of each hip were obtained, total five views. There is no demonstrable fracture or dislocation. The joint spaces appear symmetric and within normal limits. No erosive change. There is soft tissue calcification lateral to the each proximal femur at the respective intertrochanteric levels. There is degenerative change in the lower lumbar spine. IMPRESSION: No fracture or dislocation. No appreciable hip joint space arthropathy. Degenerative change is noted in the lower lumbar spine. Electronically Signed   By: Lowella Grip III M.D.   On: 12/23/2014 11:51    Microbiology: Recent Results (from the past 240 hour(s))  Culture, blood (routine x 2)     Status: None   Collection Time: 12/22/14 10:45 PM  Result Value Ref Range Status   Specimen Description BLOOD RIGHT ARM  Final   Special Requests BOTTLES DRAWN AEROBIC AND ANAEROBIC 5ML  Final   Culture  Setup Time   Final    GRAM POSITIVE COCCI IN CLUSTERS IN BOTH AEROBIC AND ANAEROBIC BOTTLES CRITICAL RESULT CALLED TO, READ BACK BY AND VERIFIED WITH: Sonnie Alamo RN 1840 12/23/14 A BROWNING     Culture   Final    STAPHYLOCOCCUS AUREUS SUSCEPTIBILITIES PERFORMED ON PREVIOUS CULTURE WITHIN THE LAST 5 DAYS. Performed at [facility]    Report Status 12/25/2014 FINAL  Final  Culture, blood (routine x 2)     Status: None   Collection Time: 12/22/14 11:50 PM  Result Value Ref Range Status   Specimen Description BLOOD LEFT WRIST  Final   Special Requests BOTTLES DRAWN AEROBIC AND ANAEROBIC 5ML  Final   Culture  Setup Time   Final    GRAM POSITIVE COCCI IN CLUSTERS IN BOTH AEROBIC AND ANAEROBIC BOTTLES CRITICAL RESULT CALLED TO, READ BACK BY AND VERIFIED WITH: S ARMSTRONG RN 2029 12/23/14 A BROWNING    Culture   Final    STAPHYLOCOCCUS AUREUS Performed at [facility]    Report Status 12/25/2014 FINAL  Final   Organism ID, Bacteria STAPHYLOCOCCUS AUREUS  Final      Susceptibility   Staphylococcus aureus - MIC*    CIPROFLOXACIN <=0.5 SENSITIVE Sensitive     ERYTHROMYCIN <=0.25 SENSITIVE Sensitive     GENTAMICIN <=0.5 SENSITIVE Sensitive     OXACILLIN 0.5 SENSITIVE Sensitive     TETRACYCLINE <=1 SENSITIVE Sensitive     VANCOMYCIN 1 SENSITIVE Sensitive     TRIMETH/SULFA <=10 SENSITIVE Sensitive     CLINDAMYCIN <=0.25 SENSITIVE Sensitive     RIFAMPIN <=0.5 SENSITIVE Sensitive     Inducible Clindamycin NEGATIVE Sensitive     * STAPHYLOCOCCUS AUREUS  Urine culture     Status: None   Collection Time: 12/23/14  2:08 AM  Result Value Ref Range Status   Specimen Description URINE, RANDOM  Final   Special Requests NONE  Final   Culture   Final    NO GROWTH 1 DAY Performed at [facility]    Report Status 12/24/2014 FINAL  Final  MRSA PCR Screening     Status: None   Collection Time: 12/23/14 10:37 AM  Result Value Ref Range Status   MRSA by PCR NEGATIVE NEGATIVE Final    Comment:        The GeneXpert MRSA Assay (FDA approved for NASAL specimens only), is one component of a comprehensive MRSA colonization surveillance program. It is not intended to  diagnose MRSA infection nor to guide or monitor treatment  for MRSA infections.   Culture, blood (routine x 2)     Status: None   Collection Time: 12/25/14  3:30 PM  Result Value Ref Range Status   Specimen Description BLOOD LEFT ARM  Final   Special Requests BOTTLES DRAWN AEROBIC AND ANAEROBIC  5CC  Final   Culture   Final    NO GROWTH 5 DAYS Performed at [facility]    Report Status 12/30/2014 FINAL  Final  Culture, blood (routine x 2)     Status: None   Collection Time: 12/25/14  3:45 PM  Result Value Ref Range Status   Specimen Description BLOOD LEFT HAND  Final   Special Requests BOTTLES DRAWN AEROBIC ONLY  [place]  Final   Culture   Final    NO GROWTH 5 DAYS Performed at [facility]    Report Status 12/30/2014 FINAL  Final     Labs: Basic Metabolic Panel:  Recent Labs Lab 12/24/14 0407 12/25/14 0515 12/26/14 0130 12/27/14 0642 12/28/14 0520 12/30/14 0520  NA 138 137 138 138 138 138  K 3.8 3.9 3.9 4.6 4.7 4.5  CL 106 105 107 107 108 106  CO2 26 25 24  20* 24 24  GLUCOSE 123* 121* 132* 189* 244* 218*  BUN 33* 33* 28* 24* 28* 25*  CREATININE 1.85* 1.54* 1.29* 1.23 1.21 1.07  CALCIUM 7.9* 8.0* 8.0* 8.5* 8.4* 8.7*  MG 1.8  --   --   --   --   --    Liver Function Tests:  Recent Labs Lab 12/23/14 1818 12/28/14 0520  AST 48* 27  ALT 22 13*  ALKPHOS 30* 37*  BILITOT 1.5* 0.5  PROT 5.9* 6.1*  ALBUMIN 3.1* 3.0*   No results for input(s): LIPASE, AMYLASE in the last 168 hours. No results for input(s): AMMONIA in the last 168 hours. CBC:  Recent Labs Lab 12/25/14 0515 12/26/14 0130 12/27/14 0642 12/28/14 0520 12/30/14 0520  WBC 6.4 6.3 3.9* 5.8 9.3  HGB 12.0* 11.5* 15.3 12.3* 12.2*  HCT 37.7* 35.7* 46.9 37.1* 37.7*  MCV 98.2 97.0 97.3 95.4 96.7  PLT 99* 99* 87* 129* 204   Cardiac Enzymes:  Recent Labs Lab 12/26/14 1120 12/28/14 0531  CKTOTAL 665* 315   BNP: BNP (last 3 results)  Recent Labs  12/23/14 0336  BNP 306.2*     ProBNP (last 3 results) No results for input(s): PROBNP in the last 8760 hours.  CBG:  Recent Labs Lab 12/29/14 1128 12/29/14 1728 12/29/14 2117 12/30/14 0730 12/30/14 1232  GLUCAP 220* 296* 241* 183* 199*       Signed:  Kemuel Buchmann MD, PhD  [facility] 12/30/2014, 2:42 PM

## 2014-12-30 NOTE — Care Management Note (Signed)
Case Management Note  Patient Details  Name: [name] MRN: [id number] Date of Birth: [birth year]  Subjective/Objective:Await PICC. D/C SNF-long term iv abx.CSW following.                    Action/Plan:d/c SNF   Expected Discharge Date:   (UNKNOWN)               Expected Discharge Plan:  Skilled Nursing Facility  In-House Referral:  Clinical Social Work  Discharge planning Services  CM Consult  Post Acute Care Choice:    Choice offered to:  Patient  DME Arranged:    DME Agency:     HH Arranged:    [place] Agency:     Status of Service:  Completed, signed off  Medicare Important Message Given:  Yes-second notification given Date Medicare IM Given:    Medicare IM give by:    Date Additional Medicare IM Given:    Additional Medicare Important Message give by:     If discussed at Ages of Stay Meetings, dates discussed:    Additional Comments:  Dessa Phi, RN 12/30/2014, 9:56 AM

## 2014-12-30 NOTE — Progress Notes (Signed)
Inpatient Diabetes Program Recommendations  AACE/ADA: New Consensus Statement on Inpatient Glycemic Control (2015)  Target Ranges:  Prepandial:   less than 140 mg/dL      Peak postprandial:   less than 180 mg/dL (1-2 hours)      Critically ill patients:  140 - 180 mg/dL   Review of Glycemic Control  Diabetes history: DM2 Outpatient Diabetes medications: Amaryl 1 mg bid Current orders for Inpatient glycemic control: Novolog sensitive tidwc  Results for [name], [name] (MRN [id number]) as of 12/30/2014 12:10  Ref. Range 12/29/2014 07:33 12/29/2014 11:28 12/29/2014 17:28 12/29/2014 21:17 12/30/2014 07:30  Glucose-Capillary Latest Ref Range: 65-99 mg/dL 200 (H) 220 (H) 296 (H) 241 (H) 183 (H)    Inpatient Diabetes Program Recommendations:     Increase Novolog to moderate tidwc while on steroids.  Will continue to follow. Thank you. Lorenda Peck, RD, LDN, CDE Inpatient Diabetes Coordinator [phone]

## 2014-12-30 NOTE — Progress Notes (Signed)
ANTICOAGULATION CONSULT NOTE - Follow up [place] for Heparin IV, Warfarin Indication: Prosthetic aortic valve replacement, DVT, Afib  Allergies  Allergen Reactions  . Demerol [Meperidine] Other (See Comments)    "hallucinations"    Patient Measurements: Height: 5\' 9"  (175.3 cm) Weight: 209 lb (94.802 kg) IBW/kg (Calculated) : 70.7  Heparin Dosing Weight: 90 kg  Vital Signs: Temp: 98.2 F (36.8 C) (11/01 0516) Temp Source: Oral (11/01 0516) BP: 111/60 mmHg (11/01 0516) Pulse Rate: 60 (11/01 0516)  Labs:  Recent Labs  12/27/14 1839 12/28/14 0520 12/28/14 0521 12/28/14 0531 12/29/14 0452 12/30/14 0520  HGB  --  12.3*  --   --   --  12.2*  HCT  --  37.1*  --   --   --  37.7*  PLT  --  129*  --   --   --  204  LABPROT  --  20.5*  --   --  27.1* 35.1*  INR  --  1.76*  --   --  2.55* 3.60*  HEPARINUNFRC 0.83*  --  0.55  --  0.38  --   CREATININE  --  1.21  --   --   --  1.07  CKTOTAL  --   --   --  315  --   --     Estimated Creatinine Clearance: 56.3 mL/min (by C-G formula based on Cr of 1.07).  Medications:  Infusions:     Assessment: 8 yoM presented to ED on 10/24 with groin pain and headache.  PMH includes CAD s/p CABG, symptomatic bradycardia s/p pacemaker, CKD, DM2, DVT,  bioprosthetic aortic valve, and afib on chronic warfarin.  CT head no evidence of acute infarction, mass lesion, or intra- or extra-axial hemorrhage.  Warfarin was been placed on hold for possible lumbar puncture.  Pharmacy consulted to dose IV heparin while INR < 2 and resume warfarin dosing on 10/28.  Home warfarin dose reported as 2.5mg  daily (last dose taken 10/23).    INR was 1.9 at [facility] visit on 10/24, but was supratherapeutic with INR 4.38 on admission 10/25.  He has been therapeutic (or very near therapeutic) with stated home dose of warfarin 2.5mg  daily with regular visits to [facility]madin Clinic since prior to 01/2014.  Supratherapeutic INR on admission may  have been d/t acute infection.  Today, 12/30/2014:  INR supratherapeutic this morning, INR 3.60 notable rate of rise overnight - most likely related to 10/29 dose of 5mg  x1.   CBC: Stable, pltc improving  Rn reports no bleeding or complications.  No major drug-drug interactions noted.  Goal of Therapy:  INR 2-3 Monitor platelets by anticoagulation protocol: Yes   Plan:   Based on INR rate of rise, will hold warfarin for 11/1.  Daily INR  Royetta Asal, PharmD, BCPS Pager [phone] 12/30/2014 9:08 AM

## 2014-12-30 NOTE — Progress Notes (Signed)
Report called to [place] at [facility].  Awaiting ambulance transport. Andre Lefort

## 2014-12-30 NOTE — NC FL2 (Signed)
[place] LEVEL OF CARE SCREENING TOOL     IDENTIFICATION  Patient Name: [name] Birthdate: 1925/12/08 Sex: male Admission Date (Current Location): 12/22/2014  [facility] and [place] Number: Herbalist and Address:  [facility],  [place] [address] [place]      Provider Number: [id number]  Attending Physician Name and Address:  Florencia Reasons, MD  Relative Name and Phone Number:       Current Level of Care: Hospital Recommended Level of Care: [place] Prior Approval Number:    Date Approved/Denied:   PASRR Number:   [id number]   Discharge Plan: SNF    Current Diagnoses: Patient Active Problem List   Diagnosis Date Noted  . FTT (failure to thrive) in adult   . Hip pain   . Groin pain   . Fever, unspecified 12/23/2014  . SIRS (systemic inflammatory response syndrome) ([place]) 12/23/2014  . Type 2 diabetes mellitus with vascular disease ([place]) 12/23/2014  . CKD (chronic kidney disease) stage 3, GFR 30-59 ml/min 12/23/2014  . Bilateral hip pain   . Elevated lactic acid level   . Pulmonary edema   . Acute respiratory failure with hypoxia ([place]) 10/03/2013  . Atypical pneumonia 10/03/2013  . Cellulitis of right lower extremity 10/03/2013  . Sepsis ([place]) 10/02/2013  . Hypotension, unspecified 10/02/2013  . AKI (acute kidney injury) ([place]) 10/02/2013  . Iron deficiency anemia 10/02/2013  . Warfarin-induced coagulopathy ([place]) 10/02/2013  . Essential hypertension, benign 08/06/2013  . Cardiac pacemaker in situ 08/06/2013  . Edema 08/06/2013  . Encounter for therapeutic drug monitoring 03/26/2013  . Diabetes mellitus ([place]) 11/19/2012  . Dermatophytosis of nail   . Gastroesophageal reflux disease   . Symptomatic bradycardia 10/04/2012  . Atrial fibrillation ([place])   . Coronary artery disease   . 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     Orientation ACTIVITIES/SOCIAL BLADDER RESPIRATION       Active Continent O2  (As needed) (3l)  BEHAVIORAL SYMPTOMS/MOOD NEUROLOGICAL BOWEL NUTRITION STATUS      Continent Diet  PHYSICIAN VISITS COMMUNICATION OF NEEDS Height & Weight Skin    Verbally 5\' 9"  (175.3 cm) 209 lbs. Normal          AMBULATORY STATUS RESPIRATION      O2 (As needed) (3l)      Personal Care Assistance Level of Assistance               Functional Limitations Info                SPECIAL CARE FACTORS FREQUENCY  PT (By licensed PT), OT (By licensed OT)     PT Frequency: 5 OT Frequency: 5           Additional Factors Info  Code Status, Allergies Code Status Info: Full Code Allergies Info: Demerol           Current Medications (12/30/2014): Current Facility-Administered Medications  Medication Dose Route Frequency Provider Last Rate Last Dose  . acetaminophen (TYLENOL) tablet 650 mg  650 mg Oral Q6H PRN Rise Patience, MD   650 mg at 12/29/14 2343   Or  . acetaminophen (TYLENOL) suppository 650 mg  650 mg Rectal Q6H PRN Rise Patience, MD   650 mg at 12/23/14 0651  . ceFAZolin (ANCEF) IVPB 2 g/50 mL premix  2 g Intravenous 3 times per day Randa Spike, RPH   2 g at 12/30/14 0529  . insulin aspart (novoLOG) injection  0-9 Units  0-9 Units Subcutaneous TID WC Rise Patience, MD   2 Units at 12/30/14 [phone]  . morphine 2 MG/ML injection 2 mg  2 mg Intravenous Q4H PRN Kelvin Cellar, MD   2 mg at 12/29/14 0930  . nitroGLYCERIN (NITROSTAT) SL tablet 0.4 mg  0.4 mg Sublingual Q5 Min x 3 PRN Rise Patience, MD      . ondansetron [facility]) tablet 4 mg  4 mg Oral Q6H PRN Rise Patience, MD       Or  . ondansetron [facility]) injection 4 mg  4 mg Intravenous Q6H PRN Rise Patience, MD      . oxyCODONE-acetaminophen (PERCOCET/ROXICET) 5-325 MG per tablet 1-2 tablet  1-2 tablet Oral Q6H PRN Jeryl Columbia, NP   2 tablet at 12/30/14 4696  . predniSONE (STERAPRED UNI-PAK 21 TAB) tablet 10 mg  10 mg Oral 3 x daily with food Polly Cobia, RPH   10  mg at 12/30/14 0817  . [START ON 12/31/2014] predniSONE (STERAPRED UNI-PAK 21 TAB) tablet 10 mg  10 mg Oral 4X daily taper Polly Cobia, RPH      . senna-docusate (Senokot-S) tablet 1 tablet  1 tablet Oral BID Florencia Reasons, MD   1 tablet at 12/30/14 [phone]  . sodium chloride 0.9 % injection 3 mL  3 mL Intravenous Q12H Rise Patience, MD   3 mL at 12/30/14 0929  . Warfarin - Pharmacist Dosing Inpatient   Does not apply q1800 Randa Spike, RPH   0  at 12/26/14 1800   Do not use this list as official medication orders. Please verify with discharge summary.  Discharge Medications:   Medication List    TAKE these medications        ceFAZolin 2-3 GM-% Solr  Commonly known as:  ANCEF  Inject 50 mLs (2 g total) into the vein every 8 (eight) hours. Last dose on 11/23     CO Q 10 PO  Take 1 capsule by mouth daily.     furosemide 20 MG tablet  Commonly known as:  LASIX  Take 20 mg by mouth every other day.     glimepiride 1 MG tablet  Commonly known as:  AMARYL  Take 1 mg by mouth 2 (two) times daily.     nitroGLYCERIN 0.4 MG SL tablet  Commonly known as:  NITROSTAT  Place 1 tablet (0.4 mg total) under the tongue every 5 (five) minutes x 3 doses as needed for chest pain.     oxyCODONE-acetaminophen 5-325 MG tablet  Commonly known as:  PERCOCET/ROXICET  Take 1-2 tablets by mouth every 6 (six) hours as needed for moderate pain.     senna-docusate 8.6-50 MG tablet  Commonly known as:  Senokot-S  Take 1 tablet by mouth 2 (two) times daily.     warfarin 5 MG tablet  Commonly known as:  COUMADIN  Take as directed by coumadin clinic      ASK your doctor about these medications        ramipril 2.5 MG capsule  Commonly known as:  ALTACE  Take 1 capsule (2.5 mg total) by mouth daily.        Relevant Imaging Results:  Relevant Lab Results:  Recent Labs    Additional Information SSN [id number]  Ludwig Clarks, LCSW

## 2014-12-31 LAB — HEMOGLOBIN A1C
HEMOGLOBIN A1C: 7.5 % — AB (ref 4.8–5.6)
Mean Plasma Glucose: 169 mg/dL

## 2015-01-04 DIAGNOSIS — I480 Paroxysmal atrial fibrillation: Secondary | ICD-10-CM | POA: Diagnosis not present

## 2015-01-04 DIAGNOSIS — E1129 Type 2 diabetes mellitus with other diabetic kidney complication: Secondary | ICD-10-CM | POA: Diagnosis not present

## 2015-01-04 DIAGNOSIS — M79651 Pain in right thigh: Secondary | ICD-10-CM | POA: Diagnosis not present

## 2015-01-04 DIAGNOSIS — Z960 Presence of urogenital implants: Secondary | ICD-10-CM | POA: Diagnosis not present

## 2015-01-04 DIAGNOSIS — I2510 Atherosclerotic heart disease of native coronary artery without angina pectoris: Secondary | ICD-10-CM | POA: Diagnosis not present

## 2015-01-04 DIAGNOSIS — A419 Sepsis, unspecified organism: Secondary | ICD-10-CM | POA: Diagnosis not present

## 2015-01-04 DIAGNOSIS — B9562 Methicillin resistant Staphylococcus aureus infection as the cause of diseases classified elsewhere: Secondary | ICD-10-CM | POA: Diagnosis not present

## 2015-01-04 DIAGNOSIS — Z7901 Long term (current) use of anticoagulants: Secondary | ICD-10-CM | POA: Diagnosis not present

## 2015-01-11 DIAGNOSIS — N481 Balanitis: Secondary | ICD-10-CM | POA: Diagnosis not present

## 2015-01-11 DIAGNOSIS — N320 Bladder-neck obstruction: Secondary | ICD-10-CM | POA: Diagnosis not present

## 2015-01-11 DIAGNOSIS — Z7901 Long term (current) use of anticoagulants: Secondary | ICD-10-CM | POA: Diagnosis not present

## 2015-01-21 ENCOUNTER — Ambulatory Visit: Payer: Medicare Other

## 2015-01-25 DIAGNOSIS — M79651 Pain in right thigh: Secondary | ICD-10-CM | POA: Diagnosis not present

## 2015-01-25 DIAGNOSIS — R7881 Bacteremia: Secondary | ICD-10-CM | POA: Diagnosis not present

## 2015-01-26 ENCOUNTER — Encounter: Payer: Self-pay | Admitting: Internal Medicine

## 2015-01-26 ENCOUNTER — Ambulatory Visit (INDEPENDENT_AMBULATORY_CARE_PROVIDER_SITE_OTHER): Payer: Medicare Other | Admitting: Internal Medicine

## 2015-01-26 ENCOUNTER — Ambulatory Visit: Payer: Medicare Other | Admitting: Podiatry

## 2015-01-26 VITALS — BP 117/73 | HR 78 | Temp 97.7°F | Ht 69.0 in | Wt 211.0 lb

## 2015-01-26 DIAGNOSIS — M25551 Pain in right hip: Secondary | ICD-10-CM

## 2015-01-26 DIAGNOSIS — R7881 Bacteremia: Secondary | ICD-10-CM | POA: Diagnosis not present

## 2015-01-26 DIAGNOSIS — Z950 Presence of cardiac pacemaker: Secondary | ICD-10-CM | POA: Diagnosis not present

## 2015-01-26 DIAGNOSIS — B9561 Methicillin susceptible Staphylococcus aureus infection as the cause of diseases classified elsewhere: Secondary | ICD-10-CM

## 2015-01-26 DIAGNOSIS — M25552 Pain in left hip: Secondary | ICD-10-CM | POA: Diagnosis not present

## 2015-01-26 LAB — CBC WITH DIFFERENTIAL/PLATELET
BASOS ABS: 0.1 10*3/uL (ref 0.0–0.1)
Basophils Relative: 1 % (ref 0–1)
Eosinophils Absolute: 0.2 10*3/uL (ref 0.0–0.7)
Eosinophils Relative: 4 % (ref 0–5)
HEMATOCRIT: 37.8 % — AB (ref 39.0–52.0)
HEMOGLOBIN: 12.1 g/dL — AB (ref 13.0–17.0)
LYMPHS ABS: 1.1 10*3/uL (ref 0.7–4.0)
LYMPHS PCT: 21 % (ref 12–46)
MCH: 30.9 pg (ref 26.0–34.0)
MCHC: 32 g/dL (ref 30.0–36.0)
MCV: 96.4 fL (ref 78.0–100.0)
MPV: 9.5 fL (ref 8.6–12.4)
Monocytes Absolute: 0.4 10*3/uL (ref 0.1–1.0)
Monocytes Relative: 8 % (ref 3–12)
NEUTROS ABS: 3.5 10*3/uL (ref 1.7–7.7)
NEUTROS PCT: 66 % (ref 43–77)
Platelets: 244 10*3/uL (ref 150–400)
RBC: 3.92 MIL/uL — AB (ref 4.22–5.81)
RDW: 14.7 % (ref 11.5–15.5)
WBC: 5.3 10*3/uL (ref 4.0–10.5)

## 2015-01-26 LAB — COMPREHENSIVE METABOLIC PANEL
ALK PHOS: 57 U/L (ref 40–115)
ALT: 4 U/L — AB (ref 9–46)
AST: 18 U/L (ref 10–35)
Albumin: 3.7 g/dL (ref 3.6–5.1)
BILIRUBIN TOTAL: 0.7 mg/dL (ref 0.2–1.2)
BUN: 22 mg/dL (ref 7–25)
CALCIUM: 9 mg/dL (ref 8.6–10.3)
CO2: 29 mmol/L (ref 20–31)
Chloride: 103 mmol/L (ref 98–110)
Creat: 1.51 mg/dL — ABNORMAL HIGH (ref 0.70–1.11)
GLUCOSE: 239 mg/dL — AB (ref 65–99)
POTASSIUM: 4.4 mmol/L (ref 3.5–5.3)
Sodium: 140 mmol/L (ref 135–146)
TOTAL PROTEIN: 6.2 g/dL (ref 6.1–8.1)

## 2015-01-26 NOTE — Progress Notes (Signed)
   Subjective:    Patient ID: [name], male    DOB: Feb 17, 1926, 81 y.o.   MRN: [id number]  HPI Here for hospital follow up.    [name] is a 81 y.o. male with history of CAD with CABG, bioprosthetic aortic valve, afib, bradycardia with pacemaker who presented with mainly a complaint of left > right groin pain. He also was febrile with chills, fever to 104, normal WBC, lactate 1.8, then repeat lactate over 2 and blood cultures now with MSSA in 2/2 blood cultures. CT of abd/pelvis done and no significant findings, Xray with no hip joint space arthropathy. Had a TEE and no vegetation on valve or PM. Started on cefazolin after sensitivities noted and has been on for 4 weeks from negative blood cultures.  No fever, no chills.  Feels well and has completed antibiotics 4 days ago.  Has no new issues.  Tolerated antibiotics with no diarrhea, no rashes.     Also had hip pain that has improved.  Work up did not reveal any infection in area but regardless is improved.  Getting PT at rehab.     Review of Systems  Gastrointestinal: Negative for nausea and diarrhea.  Genitourinary: Negative for hematuria.  Neurological: Negative for dizziness and light-headedness.       Objective:   Physical Exam  Constitutional: He appears well-developed and well-nourished.  In wheelchair  Eyes: No scleral icterus.  Cardiovascular: Normal rate, regular rhythm and normal heart sounds.   +SEM  Pulmonary/Chest: Effort normal and breath sounds normal. No respiratory distress.  Lymphadenopathy:    He has no cervical adenopathy.  Skin: No rash noted.  Vitals reviewed.   Social History   Social History  . Marital Status: Married    Spouse Name: N/A  . Number of Children: N/A  . Years of Education: N/A   Occupational History  . Not on file.   Social History Main Topics  . Smoking status: Never Smoker   . Smokeless tobacco: Never Used  . Alcohol Use: No  . Drug Use: No  . Sexual Activity:  Not on file   Other Topics Concern  . Not on file   Social History Narrative   Currently in a rehab facility      Assessment & Plan:

## 2015-01-26 NOTE — Assessment & Plan Note (Signed)
Completed therapy for bacteremia without cardiac device infection, without removal due to age and chronic comorbidities.  Checking surveillance cultures and if negative, no indication for further treatment at this time.

## 2015-01-26 NOTE — Assessment & Plan Note (Addendum)
Will check blood cultures, labs.  Will remove picc here in clinic.  Have him return in 2 months to assure no new issues.  Completed prolonged therapy.

## 2015-01-26 NOTE — Assessment & Plan Note (Signed)
Hip pain seems to be improving.  Not sure of the etiology but does not appear to have been from infection.

## 2015-01-27 LAB — SEDIMENTATION RATE: SED RATE: 13 mm/h (ref 0–20)

## 2015-02-01 LAB — CULTURE, BLOOD (SINGLE)
ORGANISM ID, BACTERIA: NO GROWTH
ORGANISM ID, BACTERIA: NO GROWTH

## 2015-02-04 DIAGNOSIS — R269 Unspecified abnormalities of gait and mobility: Secondary | ICD-10-CM | POA: Diagnosis not present

## 2015-02-04 DIAGNOSIS — I2510 Atherosclerotic heart disease of native coronary artery without angina pectoris: Secondary | ICD-10-CM | POA: Diagnosis not present

## 2015-02-04 DIAGNOSIS — R627 Adult failure to thrive: Secondary | ICD-10-CM | POA: Diagnosis not present

## 2015-02-04 DIAGNOSIS — R1030 Lower abdominal pain, unspecified: Secondary | ICD-10-CM | POA: Diagnosis not present

## 2015-02-04 DIAGNOSIS — N183 Chronic kidney disease, stage 3 (moderate): Secondary | ICD-10-CM | POA: Diagnosis not present

## 2015-02-04 DIAGNOSIS — I482 Chronic atrial fibrillation: Secondary | ICD-10-CM | POA: Diagnosis not present

## 2015-02-04 DIAGNOSIS — Z7984 Long term (current) use of oral hypoglycemic drugs: Secondary | ICD-10-CM | POA: Diagnosis not present

## 2015-02-04 DIAGNOSIS — Z7901 Long term (current) use of anticoagulants: Secondary | ICD-10-CM | POA: Diagnosis not present

## 2015-02-04 DIAGNOSIS — E1122 Type 2 diabetes mellitus with diabetic chronic kidney disease: Secondary | ICD-10-CM | POA: Diagnosis not present

## 2015-02-04 DIAGNOSIS — I10 Essential (primary) hypertension: Secondary | ICD-10-CM | POA: Diagnosis not present

## 2015-02-04 DIAGNOSIS — Z950 Presence of cardiac pacemaker: Secondary | ICD-10-CM | POA: Diagnosis not present

## 2015-02-04 DIAGNOSIS — Z9181 History of falling: Secondary | ICD-10-CM | POA: Diagnosis not present

## 2015-02-04 DIAGNOSIS — M6281 Muscle weakness (generalized): Secondary | ICD-10-CM | POA: Diagnosis not present

## 2015-02-04 DIAGNOSIS — R54 Age-related physical debility: Secondary | ICD-10-CM | POA: Diagnosis not present

## 2015-02-05 DIAGNOSIS — I2510 Atherosclerotic heart disease of native coronary artery without angina pectoris: Secondary | ICD-10-CM | POA: Diagnosis not present

## 2015-02-05 DIAGNOSIS — N183 Chronic kidney disease, stage 3 (moderate): Secondary | ICD-10-CM | POA: Diagnosis not present

## 2015-02-05 DIAGNOSIS — E1122 Type 2 diabetes mellitus with diabetic chronic kidney disease: Secondary | ICD-10-CM | POA: Diagnosis not present

## 2015-02-05 DIAGNOSIS — I10 Essential (primary) hypertension: Secondary | ICD-10-CM | POA: Diagnosis not present

## 2015-02-05 DIAGNOSIS — M6281 Muscle weakness (generalized): Secondary | ICD-10-CM | POA: Diagnosis not present

## 2015-02-05 DIAGNOSIS — I482 Chronic atrial fibrillation: Secondary | ICD-10-CM | POA: Diagnosis not present

## 2015-02-10 ENCOUNTER — Telehealth: Payer: Self-pay | Admitting: Interventional Cardiology

## 2015-02-10 ENCOUNTER — Ambulatory Visit (INDEPENDENT_AMBULATORY_CARE_PROVIDER_SITE_OTHER): Payer: Medicare Other | Admitting: Interventional Cardiology

## 2015-02-10 DIAGNOSIS — I10 Essential (primary) hypertension: Secondary | ICD-10-CM | POA: Diagnosis not present

## 2015-02-10 DIAGNOSIS — I4891 Unspecified atrial fibrillation: Secondary | ICD-10-CM

## 2015-02-10 DIAGNOSIS — M6281 Muscle weakness (generalized): Secondary | ICD-10-CM | POA: Diagnosis not present

## 2015-02-10 DIAGNOSIS — Z5181 Encounter for therapeutic drug level monitoring: Secondary | ICD-10-CM

## 2015-02-10 DIAGNOSIS — N183 Chronic kidney disease, stage 3 (moderate): Secondary | ICD-10-CM | POA: Diagnosis not present

## 2015-02-10 DIAGNOSIS — I2510 Atherosclerotic heart disease of native coronary artery without angina pectoris: Secondary | ICD-10-CM | POA: Diagnosis not present

## 2015-02-10 DIAGNOSIS — I482 Chronic atrial fibrillation: Secondary | ICD-10-CM | POA: Diagnosis not present

## 2015-02-10 DIAGNOSIS — E1122 Type 2 diabetes mellitus with diabetic chronic kidney disease: Secondary | ICD-10-CM | POA: Diagnosis not present

## 2015-02-10 LAB — POCT INR: INR: 4.3

## 2015-02-10 NOTE — Telephone Encounter (Signed)
Spoke with [facility] nurse for [facility] and received INR of 4.3. Orders given to her after discussing pt with spouse.

## 2015-02-10 NOTE — Telephone Encounter (Signed)
°  New Prob   Calling to report pts PT-INR. Please call.

## 2015-02-11 DIAGNOSIS — I10 Essential (primary) hypertension: Secondary | ICD-10-CM | POA: Diagnosis not present

## 2015-02-11 DIAGNOSIS — E1122 Type 2 diabetes mellitus with diabetic chronic kidney disease: Secondary | ICD-10-CM | POA: Diagnosis not present

## 2015-02-11 DIAGNOSIS — I482 Chronic atrial fibrillation: Secondary | ICD-10-CM | POA: Diagnosis not present

## 2015-02-11 DIAGNOSIS — M6281 Muscle weakness (generalized): Secondary | ICD-10-CM | POA: Diagnosis not present

## 2015-02-11 DIAGNOSIS — I2510 Atherosclerotic heart disease of native coronary artery without angina pectoris: Secondary | ICD-10-CM | POA: Diagnosis not present

## 2015-02-11 DIAGNOSIS — N183 Chronic kidney disease, stage 3 (moderate): Secondary | ICD-10-CM | POA: Diagnosis not present

## 2015-02-12 DIAGNOSIS — I482 Chronic atrial fibrillation: Secondary | ICD-10-CM | POA: Diagnosis not present

## 2015-02-12 DIAGNOSIS — I10 Essential (primary) hypertension: Secondary | ICD-10-CM | POA: Diagnosis not present

## 2015-02-12 DIAGNOSIS — M6281 Muscle weakness (generalized): Secondary | ICD-10-CM | POA: Diagnosis not present

## 2015-02-12 DIAGNOSIS — E1122 Type 2 diabetes mellitus with diabetic chronic kidney disease: Secondary | ICD-10-CM | POA: Diagnosis not present

## 2015-02-12 DIAGNOSIS — N183 Chronic kidney disease, stage 3 (moderate): Secondary | ICD-10-CM | POA: Diagnosis not present

## 2015-02-12 DIAGNOSIS — I2510 Atherosclerotic heart disease of native coronary artery without angina pectoris: Secondary | ICD-10-CM | POA: Diagnosis not present

## 2015-02-13 DIAGNOSIS — N183 Chronic kidney disease, stage 3 (moderate): Secondary | ICD-10-CM | POA: Diagnosis not present

## 2015-02-13 DIAGNOSIS — I482 Chronic atrial fibrillation: Secondary | ICD-10-CM | POA: Diagnosis not present

## 2015-02-13 DIAGNOSIS — M6281 Muscle weakness (generalized): Secondary | ICD-10-CM | POA: Diagnosis not present

## 2015-02-13 DIAGNOSIS — I10 Essential (primary) hypertension: Secondary | ICD-10-CM | POA: Diagnosis not present

## 2015-02-13 DIAGNOSIS — I2510 Atherosclerotic heart disease of native coronary artery without angina pectoris: Secondary | ICD-10-CM | POA: Diagnosis not present

## 2015-02-13 DIAGNOSIS — E1122 Type 2 diabetes mellitus with diabetic chronic kidney disease: Secondary | ICD-10-CM | POA: Diagnosis not present

## 2015-02-16 ENCOUNTER — Encounter: Payer: Self-pay | Admitting: Podiatry

## 2015-02-16 ENCOUNTER — Ambulatory Visit (INDEPENDENT_AMBULATORY_CARE_PROVIDER_SITE_OTHER): Payer: Medicare Other | Admitting: Cardiovascular Disease

## 2015-02-16 ENCOUNTER — Ambulatory Visit (INDEPENDENT_AMBULATORY_CARE_PROVIDER_SITE_OTHER): Payer: Medicare Other | Admitting: Podiatry

## 2015-02-16 DIAGNOSIS — M6281 Muscle weakness (generalized): Secondary | ICD-10-CM | POA: Diagnosis not present

## 2015-02-16 DIAGNOSIS — Z5181 Encounter for therapeutic drug level monitoring: Secondary | ICD-10-CM

## 2015-02-16 DIAGNOSIS — I482 Chronic atrial fibrillation: Secondary | ICD-10-CM | POA: Diagnosis not present

## 2015-02-16 DIAGNOSIS — M79676 Pain in unspecified toe(s): Secondary | ICD-10-CM

## 2015-02-16 DIAGNOSIS — I4891 Unspecified atrial fibrillation: Secondary | ICD-10-CM

## 2015-02-16 DIAGNOSIS — I10 Essential (primary) hypertension: Secondary | ICD-10-CM | POA: Diagnosis not present

## 2015-02-16 DIAGNOSIS — B351 Tinea unguium: Secondary | ICD-10-CM | POA: Diagnosis not present

## 2015-02-16 DIAGNOSIS — I2510 Atherosclerotic heart disease of native coronary artery without angina pectoris: Secondary | ICD-10-CM | POA: Diagnosis not present

## 2015-02-16 DIAGNOSIS — N183 Chronic kidney disease, stage 3 (moderate): Secondary | ICD-10-CM | POA: Diagnosis not present

## 2015-02-16 DIAGNOSIS — E1122 Type 2 diabetes mellitus with diabetic chronic kidney disease: Secondary | ICD-10-CM | POA: Diagnosis not present

## 2015-02-16 LAB — POCT INR: INR: 2.4

## 2015-02-16 NOTE — Progress Notes (Signed)
He presents today with a chief complaint of painful toenails bilateral. He states that he needs to have them cut.  Objective: Vital signs are stable he is alert and oriented 3. Pulses are strongly palpable. Toenails are thick yellow dystrophic onychomycotic and painful palpation.  Assessment: Patient and limb secondary to onychomycosis.  Plan: Debridement of toenails 1 through 5 bilateral cover service secondary to pain. Follow up with him in 3 months.

## 2015-02-17 DIAGNOSIS — R338 Other retention of urine: Secondary | ICD-10-CM | POA: Diagnosis not present

## 2015-02-17 DIAGNOSIS — N401 Enlarged prostate with lower urinary tract symptoms: Secondary | ICD-10-CM | POA: Diagnosis not present

## 2015-02-17 DIAGNOSIS — N281 Cyst of kidney, acquired: Secondary | ICD-10-CM | POA: Diagnosis not present

## 2015-02-18 DIAGNOSIS — M6281 Muscle weakness (generalized): Secondary | ICD-10-CM | POA: Diagnosis not present

## 2015-02-18 DIAGNOSIS — I2510 Atherosclerotic heart disease of native coronary artery without angina pectoris: Secondary | ICD-10-CM | POA: Diagnosis not present

## 2015-02-18 DIAGNOSIS — I482 Chronic atrial fibrillation: Secondary | ICD-10-CM | POA: Diagnosis not present

## 2015-02-18 DIAGNOSIS — E1122 Type 2 diabetes mellitus with diabetic chronic kidney disease: Secondary | ICD-10-CM | POA: Diagnosis not present

## 2015-02-18 DIAGNOSIS — N183 Chronic kidney disease, stage 3 (moderate): Secondary | ICD-10-CM | POA: Diagnosis not present

## 2015-02-18 DIAGNOSIS — I10 Essential (primary) hypertension: Secondary | ICD-10-CM | POA: Diagnosis not present

## 2015-02-19 DIAGNOSIS — I10 Essential (primary) hypertension: Secondary | ICD-10-CM | POA: Diagnosis not present

## 2015-02-19 DIAGNOSIS — M6281 Muscle weakness (generalized): Secondary | ICD-10-CM | POA: Diagnosis not present

## 2015-02-19 DIAGNOSIS — I2510 Atherosclerotic heart disease of native coronary artery without angina pectoris: Secondary | ICD-10-CM | POA: Diagnosis not present

## 2015-02-19 DIAGNOSIS — E1122 Type 2 diabetes mellitus with diabetic chronic kidney disease: Secondary | ICD-10-CM | POA: Diagnosis not present

## 2015-02-19 DIAGNOSIS — I482 Chronic atrial fibrillation: Secondary | ICD-10-CM | POA: Diagnosis not present

## 2015-02-19 DIAGNOSIS — N183 Chronic kidney disease, stage 3 (moderate): Secondary | ICD-10-CM | POA: Diagnosis not present

## 2015-02-25 DIAGNOSIS — I2510 Atherosclerotic heart disease of native coronary artery without angina pectoris: Secondary | ICD-10-CM | POA: Diagnosis not present

## 2015-02-25 DIAGNOSIS — I482 Chronic atrial fibrillation: Secondary | ICD-10-CM | POA: Diagnosis not present

## 2015-02-25 DIAGNOSIS — M6281 Muscle weakness (generalized): Secondary | ICD-10-CM | POA: Diagnosis not present

## 2015-02-25 DIAGNOSIS — N183 Chronic kidney disease, stage 3 (moderate): Secondary | ICD-10-CM | POA: Diagnosis not present

## 2015-02-25 DIAGNOSIS — I10 Essential (primary) hypertension: Secondary | ICD-10-CM | POA: Diagnosis not present

## 2015-02-25 DIAGNOSIS — E1122 Type 2 diabetes mellitus with diabetic chronic kidney disease: Secondary | ICD-10-CM | POA: Diagnosis not present

## 2015-02-27 DIAGNOSIS — N183 Chronic kidney disease, stage 3 (moderate): Secondary | ICD-10-CM | POA: Diagnosis not present

## 2015-02-27 DIAGNOSIS — I2510 Atherosclerotic heart disease of native coronary artery without angina pectoris: Secondary | ICD-10-CM | POA: Diagnosis not present

## 2015-02-27 DIAGNOSIS — I10 Essential (primary) hypertension: Secondary | ICD-10-CM | POA: Diagnosis not present

## 2015-02-27 DIAGNOSIS — E1122 Type 2 diabetes mellitus with diabetic chronic kidney disease: Secondary | ICD-10-CM | POA: Diagnosis not present

## 2015-02-27 DIAGNOSIS — I482 Chronic atrial fibrillation: Secondary | ICD-10-CM | POA: Diagnosis not present

## 2015-02-27 DIAGNOSIS — M6281 Muscle weakness (generalized): Secondary | ICD-10-CM | POA: Diagnosis not present

## 2015-03-04 ENCOUNTER — Ambulatory Visit (INDEPENDENT_AMBULATORY_CARE_PROVIDER_SITE_OTHER): Payer: Medicare Other | Admitting: Pharmacist

## 2015-03-04 DIAGNOSIS — I10 Essential (primary) hypertension: Secondary | ICD-10-CM | POA: Diagnosis not present

## 2015-03-04 DIAGNOSIS — I2510 Atherosclerotic heart disease of native coronary artery without angina pectoris: Secondary | ICD-10-CM | POA: Diagnosis not present

## 2015-03-04 DIAGNOSIS — I482 Chronic atrial fibrillation: Secondary | ICD-10-CM | POA: Diagnosis not present

## 2015-03-04 DIAGNOSIS — M6281 Muscle weakness (generalized): Secondary | ICD-10-CM | POA: Diagnosis not present

## 2015-03-04 DIAGNOSIS — E1122 Type 2 diabetes mellitus with diabetic chronic kidney disease: Secondary | ICD-10-CM | POA: Diagnosis not present

## 2015-03-04 DIAGNOSIS — N183 Chronic kidney disease, stage 3 (moderate): Secondary | ICD-10-CM | POA: Diagnosis not present

## 2015-03-04 DIAGNOSIS — Z5181 Encounter for therapeutic drug level monitoring: Secondary | ICD-10-CM

## 2015-03-04 DIAGNOSIS — I4891 Unspecified atrial fibrillation: Secondary | ICD-10-CM

## 2015-03-04 LAB — POCT INR: INR: 1.8

## 2015-03-13 DIAGNOSIS — N183 Chronic kidney disease, stage 3 (moderate): Secondary | ICD-10-CM | POA: Diagnosis not present

## 2015-03-13 DIAGNOSIS — E1122 Type 2 diabetes mellitus with diabetic chronic kidney disease: Secondary | ICD-10-CM | POA: Diagnosis not present

## 2015-03-13 DIAGNOSIS — I10 Essential (primary) hypertension: Secondary | ICD-10-CM | POA: Diagnosis not present

## 2015-03-13 DIAGNOSIS — I2510 Atherosclerotic heart disease of native coronary artery without angina pectoris: Secondary | ICD-10-CM | POA: Diagnosis not present

## 2015-03-13 DIAGNOSIS — M6281 Muscle weakness (generalized): Secondary | ICD-10-CM | POA: Diagnosis not present

## 2015-03-13 DIAGNOSIS — I482 Chronic atrial fibrillation: Secondary | ICD-10-CM | POA: Diagnosis not present

## 2015-03-15 ENCOUNTER — Other Ambulatory Visit: Payer: Self-pay | Admitting: Interventional Cardiology

## 2015-03-17 DIAGNOSIS — Z950 Presence of cardiac pacemaker: Secondary | ICD-10-CM | POA: Diagnosis not present

## 2015-03-17 DIAGNOSIS — R7881 Bacteremia: Secondary | ICD-10-CM | POA: Diagnosis not present

## 2015-03-17 DIAGNOSIS — E0842 Diabetes mellitus due to underlying condition with diabetic polyneuropathy: Secondary | ICD-10-CM | POA: Diagnosis not present

## 2015-03-17 DIAGNOSIS — L989 Disorder of the skin and subcutaneous tissue, unspecified: Secondary | ICD-10-CM | POA: Diagnosis not present

## 2015-03-17 DIAGNOSIS — I482 Chronic atrial fibrillation: Secondary | ICD-10-CM | POA: Diagnosis not present

## 2015-03-17 DIAGNOSIS — N183 Chronic kidney disease, stage 3 (moderate): Secondary | ICD-10-CM | POA: Diagnosis not present

## 2015-03-17 DIAGNOSIS — I10 Essential (primary) hypertension: Secondary | ICD-10-CM | POA: Diagnosis not present

## 2015-03-17 DIAGNOSIS — E1122 Type 2 diabetes mellitus with diabetic chronic kidney disease: Secondary | ICD-10-CM | POA: Diagnosis not present

## 2015-03-17 DIAGNOSIS — M21372 Foot drop, left foot: Secondary | ICD-10-CM | POA: Diagnosis not present

## 2015-03-17 DIAGNOSIS — Z952 Presence of prosthetic heart valve: Secondary | ICD-10-CM | POA: Diagnosis not present

## 2015-03-18 DIAGNOSIS — I482 Chronic atrial fibrillation: Secondary | ICD-10-CM | POA: Diagnosis not present

## 2015-03-18 DIAGNOSIS — I10 Essential (primary) hypertension: Secondary | ICD-10-CM | POA: Diagnosis not present

## 2015-03-18 DIAGNOSIS — N183 Chronic kidney disease, stage 3 (moderate): Secondary | ICD-10-CM | POA: Diagnosis not present

## 2015-03-18 DIAGNOSIS — E1122 Type 2 diabetes mellitus with diabetic chronic kidney disease: Secondary | ICD-10-CM | POA: Diagnosis not present

## 2015-03-18 DIAGNOSIS — I2510 Atherosclerotic heart disease of native coronary artery without angina pectoris: Secondary | ICD-10-CM | POA: Diagnosis not present

## 2015-03-18 DIAGNOSIS — M6281 Muscle weakness (generalized): Secondary | ICD-10-CM | POA: Diagnosis not present

## 2015-03-20 DIAGNOSIS — I10 Essential (primary) hypertension: Secondary | ICD-10-CM | POA: Diagnosis not present

## 2015-03-20 DIAGNOSIS — M6281 Muscle weakness (generalized): Secondary | ICD-10-CM | POA: Diagnosis not present

## 2015-03-20 DIAGNOSIS — E1122 Type 2 diabetes mellitus with diabetic chronic kidney disease: Secondary | ICD-10-CM | POA: Diagnosis not present

## 2015-03-20 DIAGNOSIS — I482 Chronic atrial fibrillation: Secondary | ICD-10-CM | POA: Diagnosis not present

## 2015-03-20 DIAGNOSIS — N183 Chronic kidney disease, stage 3 (moderate): Secondary | ICD-10-CM | POA: Diagnosis not present

## 2015-03-20 DIAGNOSIS — I2510 Atherosclerotic heart disease of native coronary artery without angina pectoris: Secondary | ICD-10-CM | POA: Diagnosis not present

## 2015-03-23 ENCOUNTER — Encounter: Payer: Medicare Other | Admitting: *Deleted

## 2015-03-24 ENCOUNTER — Ambulatory Visit (INDEPENDENT_AMBULATORY_CARE_PROVIDER_SITE_OTHER): Payer: Medicare Other | Admitting: *Deleted

## 2015-03-24 DIAGNOSIS — Z5181 Encounter for therapeutic drug level monitoring: Secondary | ICD-10-CM

## 2015-03-24 DIAGNOSIS — I4891 Unspecified atrial fibrillation: Secondary | ICD-10-CM | POA: Diagnosis not present

## 2015-03-24 LAB — POCT INR: INR: 2.2

## 2015-03-25 ENCOUNTER — Encounter: Payer: Self-pay | Admitting: Cardiology

## 2015-03-26 ENCOUNTER — Ambulatory Visit: Payer: Medicare Other | Admitting: Interventional Cardiology

## 2015-04-02 DIAGNOSIS — I482 Chronic atrial fibrillation: Secondary | ICD-10-CM | POA: Diagnosis not present

## 2015-04-02 DIAGNOSIS — N183 Chronic kidney disease, stage 3 (moderate): Secondary | ICD-10-CM | POA: Diagnosis not present

## 2015-04-02 DIAGNOSIS — I2510 Atherosclerotic heart disease of native coronary artery without angina pectoris: Secondary | ICD-10-CM | POA: Diagnosis not present

## 2015-04-02 DIAGNOSIS — I10 Essential (primary) hypertension: Secondary | ICD-10-CM | POA: Diagnosis not present

## 2015-04-02 DIAGNOSIS — E1122 Type 2 diabetes mellitus with diabetic chronic kidney disease: Secondary | ICD-10-CM | POA: Diagnosis not present

## 2015-04-02 DIAGNOSIS — M6281 Muscle weakness (generalized): Secondary | ICD-10-CM | POA: Diagnosis not present

## 2015-04-06 ENCOUNTER — Other Ambulatory Visit: Payer: Self-pay | Admitting: Interventional Cardiology

## 2015-04-08 DIAGNOSIS — L570 Actinic keratosis: Secondary | ICD-10-CM | POA: Diagnosis not present

## 2015-04-08 DIAGNOSIS — L821 Other seborrheic keratosis: Secondary | ICD-10-CM | POA: Diagnosis not present

## 2015-04-08 DIAGNOSIS — L111 Transient acantholytic dermatosis [Grover]: Secondary | ICD-10-CM | POA: Diagnosis not present

## 2015-04-08 DIAGNOSIS — L820 Inflamed seborrheic keratosis: Secondary | ICD-10-CM | POA: Diagnosis not present

## 2015-04-08 DIAGNOSIS — L919 Hypertrophic disorder of the skin, unspecified: Secondary | ICD-10-CM | POA: Diagnosis not present

## 2015-04-09 ENCOUNTER — Ambulatory Visit: Payer: Medicare Other | Admitting: Internal Medicine

## 2015-04-13 NOTE — Progress Notes (Signed)
Patient ID: [name], male   DOB: [birth year], 82 y.o.   MRN: [id number]     Cardiology Office Note   Date:  04/14/2015   ID:  [name], DOB 26-Feb-1926, MRN [id number]  PCP:  Wenda Low, MD    Chief Complaint  Patient presents with  . Follow-up    no sx   F/u AFib  Wt Readings from Last 3 Encounters:  04/14/15 204 lb 6.4 oz (92.715 kg)  01/26/15 211 lb (95.709 kg)  12/25/14 209 lb (94.802 kg)       History of Present Illness: [name] is a 82 y.o. male  who has had single vessel CABG and AVR in 2002. He has had AFib. He has to started walking with a walker.  Pacer was placed several years ago, 2014.  He has had normal checks.  No bleeding problems.   He has  Stopped doing a lot of the exercises he was doing with physical therapy. His wife is willing to remind him.   He is diligent about using his walker to avoid falling.   He denies any chest discomfort, shortness of breath, palpitations. He has chronic right leg swelling. No significant change.    Past Medical History  Diagnosis Date  . PVD (peripheral vascular disease) ([place])   . Permanent atrial fibrillation ([place])   . DVT (deep venous thrombosis) ([place])   . Coronary artery disease   . Aortic stenosis   . GERD (gastroesophageal reflux disease)   . BPH (benign prostatic hypertrophy)   . Kidney stones   . Diverticulosis   . Shortness of breath   . Diabetes mellitus without complication ([place])     type 2  . 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 Gout   . Dermatophytosis of nail   . Gastroesophageal reflux disease   . PAH (pulmonary artery hypertension) ([place])     PAS 63 on ECHO 09/2012    Past Surgical History  Procedure Laterality Date  . Coronary artery bypass graft    . Aortic valve replacement  2006  . Paraesophageal hernia repair    . Hernia repair    . Pacemaker insertion  10-04-2012    Medtronic  pacemaker implanted by Dr Rayann Heman for symptomatic bradycardia  . Esophagogastroduodenoscopy N/A 12/16/2013    Procedure: ESOPHAGOGASTRODUODENOSCOPY (EGD);  Surgeon: Garlan Fair, MD;  Location: [facility] ENDOS[facility];  Service: Endoscopy;  Laterality: N/A;  . Balloon dilation N/A 12/16/2013    Procedure: BALLOON DILATION;  Surgeon: Garlan Fair, MD;  Location: [facility] ENDOSCOPY;  Service: Endoscopy;  Laterality: N/A;  . Permanent pacemaker insertion N/A 10/04/2012    Procedure: PERMANENT PACEMAKER INSERTION;  Surgeon: Thompson Grayer, MD;  Location: [facility] CATH LAB;  Service: Cardiovascular;  Laterality: N/A;  . Tee without cardioversion N/A 12/25/2014    Procedure: TRANSESOPHAGEAL ECHOCARDIOGRAM (TEE)   (inpatient at [facility]);  Surgeon: Skeet Latch, MD;  Location: [facility] ENDOSCOPY;  Service: Cardiovascular;  Laterality: N/A;     Current Outpatient Prescriptions  Medication Sig Dispense Refill  . Coenzyme Q10 (CO Q 10 PO) Take 1 capsule by mouth daily.    . furosemide (LASIX) 20 MG tablet Take 20 mg by mouth every other day.    [name] glimepiride (AMARYL) 1 MG tablet Take 1 mg by mouth 2 (two) times daily.     . nitroGLYCERIN (NITROSTAT) 0.4 MG SL tablet Place  1 tablet (0.4 mg total) under the tongue every 5 (five) minutes x 3 doses as needed for chest pain. 25 tablet 6  . ramipril (ALTACE) 2.5 MG capsule TAKE 1 CAPSULE BY MOUTH DAILY. 30 capsule 0  . senna-docusate (SENOKOT-S) 8.6-50 MG tablet Take 1 tablet by mouth 2 (two) times daily. 14 tablet 0  . tamsulosin (FLOMAX) 0.4 MG CAPS capsule Take 1 capsule (0.4 mg total) by mouth daily. 30 capsule 0  . warfarin (COUMADIN) 5 MG tablet Take as directed by coumadin clinic (Patient taking differently: Take 2.5 mg by mouth daily at 6 PM. Take as directed by coumadin clinic) 25 tablet 3   No current facility-administered medications for this visit.    Allergies:   Demerol    Social History:  The patient  reports that he has never smoked. He has never used  smokeless tobacco. He reports that he does not drink alcohol or use illicit drugs.   Family History:  The patient's family history includes Cancer - Other in his father.    ROS:  Please see the history of present illness.   Otherwise, review of systems are positive for  chronic right leg swelling.   All other systems are reviewed and negative.    PHYSICAL EXAM: VS:  BP 122/60 mmHg  Pulse 84  Ht 5\' 9"  (1.753 m)  Wt 204 lb 6.4 oz (92.715 kg)  BMI 30.17 kg/m2  SpO2 96% , BMI Body mass index is 30.17 kg/(m^2). GEN: Well nourished, well developed, in no acute distress HEENT: normal Neck: no JVD, carotid bruits, or masses Cardiac:  Regular rate, irregular rhythm; no murmurs, rubs, or gallops,no edema  Respiratory:  clear to auscultation bilaterally, normal work of breathing GI: soft, nontender, nondistended, + BS MS: no deformity or atrophy Skin: warm and dry, no rash Neuro:  Strength and sensation are intact Psych: euthymic mood, full affect    Recent Labs: 12/23/2014: B Natriuretic Peptide 306.2* 12/24/2014: Magnesium 1.8 12/28/2014: TSH 4.767* 01/26/2015: ALT 4*; BUN 22; Creat 1.51*; Hemoglobin 12.1*; Platelets 244; Potassium 4.4; Sodium 140   Lipid Panel No results found for: CHOL, TRIG, HDL, CHOLHDL, VLDL, LDLCALC, LDLDIRECT   Other studies Reviewed: Additional studies/ records that were reviewed today with results demonstrating:  Normal left ventricular function, moderately dilated left atrium. .   ASSESSMENT AND PLAN:  1. Chronic AFib:   Rate controlled. Continue current medications.  Coumadin for stroke prevention. He follows with our form disease for Coumadin adjustments. 2. S/p Pacer:  Functioning normally on most recent check. 3. HTN:  Well controlled. Continue current medicines. 4. DM Type 2: Managed by PMD.   Current medicines are reviewed at length with the patient today.  The patient concerns regarding his medicines were addressed.  The following changes  have been made:  No change  Labs/ tests ordered today include:  No orders of the defined types were placed in this encounter.    Recommend 150 minutes/week of aerobic exercise Low fat, low carb, high fiber diet recommended  Disposition:   FU in 1 year   Teresita Madura., MD  04/14/2015 10:16 AM    [facility] [place], [place], [place]  [address] Phone: [phone]; Fax: [phone]

## 2015-04-14 ENCOUNTER — Encounter: Payer: Self-pay | Admitting: Interventional Cardiology

## 2015-04-14 ENCOUNTER — Ambulatory Visit (INDEPENDENT_AMBULATORY_CARE_PROVIDER_SITE_OTHER): Payer: Medicare Other | Admitting: Interventional Cardiology

## 2015-04-14 ENCOUNTER — Ambulatory Visit (INDEPENDENT_AMBULATORY_CARE_PROVIDER_SITE_OTHER): Payer: Medicare Other | Admitting: *Deleted

## 2015-04-14 VITALS — BP 122/60 | HR 84 | Ht 69.0 in | Wt 204.4 lb

## 2015-04-14 DIAGNOSIS — Z950 Presence of cardiac pacemaker: Secondary | ICD-10-CM

## 2015-04-14 DIAGNOSIS — I4891 Unspecified atrial fibrillation: Secondary | ICD-10-CM

## 2015-04-14 DIAGNOSIS — E1159 Type 2 diabetes mellitus with other circulatory complications: Secondary | ICD-10-CM | POA: Diagnosis not present

## 2015-04-14 DIAGNOSIS — R001 Bradycardia, unspecified: Secondary | ICD-10-CM | POA: Diagnosis not present

## 2015-04-14 DIAGNOSIS — Z5181 Encounter for therapeutic drug level monitoring: Secondary | ICD-10-CM

## 2015-04-14 DIAGNOSIS — I482 Chronic atrial fibrillation, unspecified: Secondary | ICD-10-CM

## 2015-04-14 LAB — POCT INR: INR: 1.6

## 2015-04-14 NOTE — Patient Instructions (Signed)
**Note De-identified Ainsleigh Kakos Obfuscation** Medication Instructions:  Same-no changes  Labwork: None  Testing/Procedures: None  Follow-Up: Your physician wants you to follow-up in: 1 year. You will receive a reminder letter in the mail two months in advance. If you don't receive a letter, please call our office to schedule the follow-up appointment.      If you need a refill on your cardiac medications before your next appointment, please call your pharmacy.   

## 2015-04-28 ENCOUNTER — Ambulatory Visit (INDEPENDENT_AMBULATORY_CARE_PROVIDER_SITE_OTHER): Payer: Medicare Other

## 2015-04-28 DIAGNOSIS — I482 Chronic atrial fibrillation, unspecified: Secondary | ICD-10-CM

## 2015-04-28 DIAGNOSIS — I4891 Unspecified atrial fibrillation: Secondary | ICD-10-CM | POA: Diagnosis not present

## 2015-04-28 DIAGNOSIS — Z5181 Encounter for therapeutic drug level monitoring: Secondary | ICD-10-CM

## 2015-04-28 LAB — POCT INR: INR: 1.6

## 2015-04-30 DIAGNOSIS — H5203 Hypermetropia, bilateral: Secondary | ICD-10-CM | POA: Diagnosis not present

## 2015-04-30 DIAGNOSIS — E119 Type 2 diabetes mellitus without complications: Secondary | ICD-10-CM | POA: Diagnosis not present

## 2015-04-30 DIAGNOSIS — H25099 Other age-related incipient cataract, unspecified eye: Secondary | ICD-10-CM | POA: Diagnosis not present

## 2015-05-03 ENCOUNTER — Other Ambulatory Visit: Payer: Self-pay | Admitting: Interventional Cardiology

## 2015-05-04 ENCOUNTER — Other Ambulatory Visit: Payer: Self-pay | Admitting: *Deleted

## 2015-05-12 ENCOUNTER — Ambulatory Visit (INDEPENDENT_AMBULATORY_CARE_PROVIDER_SITE_OTHER): Payer: Medicare Other

## 2015-05-12 DIAGNOSIS — I482 Chronic atrial fibrillation, unspecified: Secondary | ICD-10-CM

## 2015-05-12 DIAGNOSIS — I4891 Unspecified atrial fibrillation: Secondary | ICD-10-CM | POA: Diagnosis not present

## 2015-05-12 DIAGNOSIS — Z5181 Encounter for therapeutic drug level monitoring: Secondary | ICD-10-CM | POA: Diagnosis not present

## 2015-05-12 LAB — POCT INR: INR: 2

## 2015-05-18 ENCOUNTER — Encounter: Payer: Self-pay | Admitting: Podiatry

## 2015-05-18 ENCOUNTER — Ambulatory Visit (INDEPENDENT_AMBULATORY_CARE_PROVIDER_SITE_OTHER): Payer: Medicare Other | Admitting: Podiatry

## 2015-05-18 DIAGNOSIS — B351 Tinea unguium: Secondary | ICD-10-CM | POA: Diagnosis not present

## 2015-05-18 DIAGNOSIS — M79676 Pain in unspecified toe(s): Secondary | ICD-10-CM

## 2015-05-18 NOTE — Progress Notes (Signed)
Presents today with a chief complaint of painful elongated toenails 1 through 5 bilateral.   objective: Vital signs are stable alert and oriented 3. Pulses are palpable. Nails are thick yellow dystrophic onychomycotic.   assessment: Pain in limb secondary to onychomycosis.  Plan: debridement of toenails 1 through 5 bilateral.

## 2015-06-02 ENCOUNTER — Ambulatory Visit (INDEPENDENT_AMBULATORY_CARE_PROVIDER_SITE_OTHER): Payer: Medicare Other | Admitting: *Deleted

## 2015-06-02 DIAGNOSIS — I4891 Unspecified atrial fibrillation: Secondary | ICD-10-CM | POA: Diagnosis not present

## 2015-06-02 DIAGNOSIS — Z5181 Encounter for therapeutic drug level monitoring: Secondary | ICD-10-CM | POA: Diagnosis not present

## 2015-06-02 DIAGNOSIS — I482 Chronic atrial fibrillation, unspecified: Secondary | ICD-10-CM

## 2015-06-02 LAB — POCT INR: INR: 2.3

## 2015-07-02 ENCOUNTER — Ambulatory Visit (INDEPENDENT_AMBULATORY_CARE_PROVIDER_SITE_OTHER): Payer: Medicare Other

## 2015-07-02 DIAGNOSIS — Z5181 Encounter for therapeutic drug level monitoring: Secondary | ICD-10-CM | POA: Diagnosis not present

## 2015-07-02 DIAGNOSIS — I482 Chronic atrial fibrillation, unspecified: Secondary | ICD-10-CM

## 2015-07-02 DIAGNOSIS — I4891 Unspecified atrial fibrillation: Secondary | ICD-10-CM

## 2015-07-02 LAB — POCT INR: INR: 2

## 2015-07-20 DIAGNOSIS — E0842 Diabetes mellitus due to underlying condition with diabetic polyneuropathy: Secondary | ICD-10-CM | POA: Diagnosis not present

## 2015-07-20 DIAGNOSIS — E1122 Type 2 diabetes mellitus with diabetic chronic kidney disease: Secondary | ICD-10-CM | POA: Diagnosis not present

## 2015-07-20 DIAGNOSIS — N183 Chronic kidney disease, stage 3 (moderate): Secondary | ICD-10-CM | POA: Diagnosis not present

## 2015-07-20 DIAGNOSIS — M21372 Foot drop, left foot: Secondary | ICD-10-CM | POA: Diagnosis not present

## 2015-07-20 DIAGNOSIS — M519 Unspecified thoracic, thoracolumbar and lumbosacral intervertebral disc disorder: Secondary | ICD-10-CM | POA: Diagnosis not present

## 2015-07-20 DIAGNOSIS — Z952 Presence of prosthetic heart valve: Secondary | ICD-10-CM | POA: Diagnosis not present

## 2015-07-20 DIAGNOSIS — I482 Chronic atrial fibrillation: Secondary | ICD-10-CM | POA: Diagnosis not present

## 2015-07-20 DIAGNOSIS — Z950 Presence of cardiac pacemaker: Secondary | ICD-10-CM | POA: Diagnosis not present

## 2015-07-20 DIAGNOSIS — Z7984 Long term (current) use of oral hypoglycemic drugs: Secondary | ICD-10-CM | POA: Diagnosis not present

## 2015-07-20 DIAGNOSIS — I10 Essential (primary) hypertension: Secondary | ICD-10-CM | POA: Diagnosis not present

## 2015-07-20 DIAGNOSIS — N400 Enlarged prostate without lower urinary tract symptoms: Secondary | ICD-10-CM | POA: Diagnosis not present

## 2015-07-20 DIAGNOSIS — I25810 Atherosclerosis of coronary artery bypass graft(s) without angina pectoris: Secondary | ICD-10-CM | POA: Diagnosis not present

## 2015-07-21 ENCOUNTER — Encounter: Payer: Self-pay | Admitting: Internal Medicine

## 2015-07-21 ENCOUNTER — Ambulatory Visit (INDEPENDENT_AMBULATORY_CARE_PROVIDER_SITE_OTHER): Payer: Medicare Other | Admitting: Internal Medicine

## 2015-07-21 VITALS — BP 133/68 | HR 82 | Temp 98.7°F | Wt 206.0 lb

## 2015-07-21 DIAGNOSIS — M25559 Pain in unspecified hip: Secondary | ICD-10-CM

## 2015-07-21 DIAGNOSIS — B9561 Methicillin susceptible Staphylococcus aureus infection as the cause of diseases classified elsewhere: Secondary | ICD-10-CM | POA: Diagnosis not present

## 2015-07-21 DIAGNOSIS — R7881 Bacteremia: Secondary | ICD-10-CM

## 2015-07-21 NOTE — Assessment & Plan Note (Signed)
Doing well and no further complaints, walking and not in wheelchair now.

## 2015-07-21 NOTE — Progress Notes (Signed)
   Subjective:    Patient ID: [name], male    DOB: 1925/09/12, 82 y.o.   MRN: [id number]  HPI Here for hospital follow up.    [name] is a 82 y.o. male with history of CAD with CABG, bioprosthetic aortic valve, afib, bradycardia with pacemaker who presented with mainly a complaint of left > right groin pain and noted MSSA bacteremia. Had a TEE and no vegetation on valve or PM. Completed antibiotics about 6 months ago and follow up blood cultures were negative.  No new issues, PM doing well without erythema, no fever no chils.    Review of Systems  Gastrointestinal: Negative for nausea and diarrhea.  Genitourinary: Negative for hematuria.  Neurological: Negative for dizziness and light-headedness.       Objective:   Physical Exam  Constitutional: He appears well-developed and well-nourished.  Eyes: No scleral icterus.  Cardiovascular: Normal rate, regular rhythm and normal heart sounds.   +SEM  Pulmonary/Chest: Effort normal and breath sounds normal. No respiratory distress.  Lymphadenopathy:    He has no cervical adenopathy.  Skin: No rash noted.  Vitals reviewed.   Social History   Social History  . Marital Status: Married    Spouse Name: N/A  . Number of Children: N/A  . Years of Education: N/A   Occupational History  . Not on file.   Social History Main Topics  . Smoking status: Never Smoker   . Smokeless tobacco: Never Used  . Alcohol Use: No  . Drug Use: No  . Sexual Activity: Not on file   Other Topics Concern  . Not on file   Social History Narrative   Currently in a rehab facility      Assessment & Plan:

## 2015-07-21 NOTE — Assessment & Plan Note (Signed)
resolved 

## 2015-07-30 ENCOUNTER — Ambulatory Visit (INDEPENDENT_AMBULATORY_CARE_PROVIDER_SITE_OTHER): Payer: Medicare Other | Admitting: *Deleted

## 2015-07-30 DIAGNOSIS — Z5181 Encounter for therapeutic drug level monitoring: Secondary | ICD-10-CM

## 2015-07-30 DIAGNOSIS — I4891 Unspecified atrial fibrillation: Secondary | ICD-10-CM

## 2015-07-30 DIAGNOSIS — I482 Chronic atrial fibrillation, unspecified: Secondary | ICD-10-CM

## 2015-07-30 LAB — POCT INR: INR: 2

## 2015-08-24 ENCOUNTER — Encounter: Payer: Self-pay | Admitting: Podiatry

## 2015-08-24 ENCOUNTER — Ambulatory Visit (INDEPENDENT_AMBULATORY_CARE_PROVIDER_SITE_OTHER): Payer: Medicare Other | Admitting: Podiatry

## 2015-08-24 DIAGNOSIS — M79676 Pain in unspecified toe(s): Secondary | ICD-10-CM

## 2015-08-24 DIAGNOSIS — B351 Tinea unguium: Secondary | ICD-10-CM | POA: Diagnosis not present

## 2015-08-24 NOTE — Progress Notes (Signed)
He presents today for chief complaint of painful elongated toenails. States that he is doing very well.  Objective: Vital signs are stable he is alert and oriented 3 presents with a walker. Considerable edema about the bilateral lower extremity from the knee down pulses remain palpable capillary fill time is immediate. Toenails are thick yellow dystrophic onychomycotic.  Assessment: Pain and limp secondary to onychomycosis.  Plan: Debridement of toenails 1 through 5 bilateral.

## 2015-08-27 ENCOUNTER — Ambulatory Visit (INDEPENDENT_AMBULATORY_CARE_PROVIDER_SITE_OTHER): Payer: Medicare Other | Admitting: *Deleted

## 2015-08-27 DIAGNOSIS — Z5181 Encounter for therapeutic drug level monitoring: Secondary | ICD-10-CM | POA: Diagnosis not present

## 2015-08-27 DIAGNOSIS — I482 Chronic atrial fibrillation, unspecified: Secondary | ICD-10-CM

## 2015-08-27 DIAGNOSIS — I4891 Unspecified atrial fibrillation: Secondary | ICD-10-CM

## 2015-08-27 LAB — POCT INR: INR: 2.6

## 2015-10-08 ENCOUNTER — Ambulatory Visit (INDEPENDENT_AMBULATORY_CARE_PROVIDER_SITE_OTHER): Payer: Medicare Other | Admitting: *Deleted

## 2015-10-08 DIAGNOSIS — Z5181 Encounter for therapeutic drug level monitoring: Secondary | ICD-10-CM

## 2015-10-08 DIAGNOSIS — I4891 Unspecified atrial fibrillation: Secondary | ICD-10-CM

## 2015-10-08 LAB — POCT INR: INR: 2.7

## 2015-10-29 DIAGNOSIS — C44629 Squamous cell carcinoma of skin of left upper limb, including shoulder: Secondary | ICD-10-CM | POA: Diagnosis not present

## 2015-10-29 DIAGNOSIS — L570 Actinic keratosis: Secondary | ICD-10-CM | POA: Diagnosis not present

## 2015-11-06 ENCOUNTER — Other Ambulatory Visit: Payer: Self-pay | Admitting: Interventional Cardiology

## 2015-11-16 ENCOUNTER — Ambulatory Visit (INDEPENDENT_AMBULATORY_CARE_PROVIDER_SITE_OTHER): Payer: Medicare Other | Admitting: Podiatry

## 2015-11-16 ENCOUNTER — Encounter: Payer: Self-pay | Admitting: Podiatry

## 2015-11-16 DIAGNOSIS — M79676 Pain in unspecified toe(s): Secondary | ICD-10-CM

## 2015-11-16 DIAGNOSIS — B351 Tinea unguium: Secondary | ICD-10-CM

## 2015-11-16 NOTE — Progress Notes (Signed)
He presents today for chief complaint of painful elongated toenails.  Objective: His toenails are long and thick yellow dystrophic with mycotic painful palpation considerably about the bilateral lower extremity. No open lesions or wounds.  Assessment: Pain and limp secondary to onychomycosis.  Plan: Debridement of toenails 1 through 5 bilateral.

## 2015-11-19 ENCOUNTER — Ambulatory Visit (INDEPENDENT_AMBULATORY_CARE_PROVIDER_SITE_OTHER): Payer: Medicare Other | Admitting: *Deleted

## 2015-11-19 DIAGNOSIS — Z5181 Encounter for therapeutic drug level monitoring: Secondary | ICD-10-CM | POA: Diagnosis not present

## 2015-11-19 DIAGNOSIS — I4891 Unspecified atrial fibrillation: Secondary | ICD-10-CM | POA: Diagnosis not present

## 2015-11-19 LAB — POCT INR: INR: 2.1

## 2015-11-25 DIAGNOSIS — I10 Essential (primary) hypertension: Secondary | ICD-10-CM | POA: Diagnosis not present

## 2015-11-25 DIAGNOSIS — K219 Gastro-esophageal reflux disease without esophagitis: Secondary | ICD-10-CM | POA: Diagnosis not present

## 2015-11-25 DIAGNOSIS — E1122 Type 2 diabetes mellitus with diabetic chronic kidney disease: Secondary | ICD-10-CM | POA: Diagnosis not present

## 2015-11-25 DIAGNOSIS — M1990 Unspecified osteoarthritis, unspecified site: Secondary | ICD-10-CM | POA: Diagnosis not present

## 2015-11-25 DIAGNOSIS — M4806 Spinal stenosis, lumbar region: Secondary | ICD-10-CM | POA: Diagnosis not present

## 2015-11-25 DIAGNOSIS — K222 Esophageal obstruction: Secondary | ICD-10-CM | POA: Diagnosis not present

## 2015-11-25 DIAGNOSIS — Z950 Presence of cardiac pacemaker: Secondary | ICD-10-CM | POA: Diagnosis not present

## 2015-11-25 DIAGNOSIS — E0842 Diabetes mellitus due to underlying condition with diabetic polyneuropathy: Secondary | ICD-10-CM | POA: Diagnosis not present

## 2015-11-25 DIAGNOSIS — I482 Chronic atrial fibrillation: Secondary | ICD-10-CM | POA: Diagnosis not present

## 2015-11-25 DIAGNOSIS — N400 Enlarged prostate without lower urinary tract symptoms: Secondary | ICD-10-CM | POA: Diagnosis not present

## 2015-11-25 DIAGNOSIS — Z7984 Long term (current) use of oral hypoglycemic drugs: Secondary | ICD-10-CM | POA: Diagnosis not present

## 2015-11-25 DIAGNOSIS — N183 Chronic kidney disease, stage 3 (moderate): Secondary | ICD-10-CM | POA: Diagnosis not present

## 2015-11-25 DIAGNOSIS — Z23 Encounter for immunization: Secondary | ICD-10-CM | POA: Diagnosis not present

## 2015-11-25 DIAGNOSIS — Z0000 Encounter for general adult medical examination without abnormal findings: Secondary | ICD-10-CM | POA: Diagnosis not present

## 2015-11-25 DIAGNOSIS — I25810 Atherosclerosis of coronary artery bypass graft(s) without angina pectoris: Secondary | ICD-10-CM | POA: Diagnosis not present

## 2015-11-25 DIAGNOSIS — Z1389 Encounter for screening for other disorder: Secondary | ICD-10-CM | POA: Diagnosis not present

## 2015-11-25 DIAGNOSIS — M21372 Foot drop, left foot: Secondary | ICD-10-CM | POA: Diagnosis not present

## 2015-12-10 DIAGNOSIS — Z85828 Personal history of other malignant neoplasm of skin: Secondary | ICD-10-CM | POA: Diagnosis not present

## 2015-12-10 DIAGNOSIS — L820 Inflamed seborrheic keratosis: Secondary | ICD-10-CM | POA: Diagnosis not present

## 2015-12-10 DIAGNOSIS — L570 Actinic keratosis: Secondary | ICD-10-CM | POA: Diagnosis not present

## 2015-12-31 ENCOUNTER — Ambulatory Visit (INDEPENDENT_AMBULATORY_CARE_PROVIDER_SITE_OTHER): Payer: Medicare Other | Admitting: *Deleted

## 2015-12-31 DIAGNOSIS — I4891 Unspecified atrial fibrillation: Secondary | ICD-10-CM

## 2015-12-31 DIAGNOSIS — Z5181 Encounter for therapeutic drug level monitoring: Secondary | ICD-10-CM | POA: Diagnosis not present

## 2015-12-31 LAB — POCT INR: INR: 2.3

## 2016-02-11 ENCOUNTER — Ambulatory Visit (INDEPENDENT_AMBULATORY_CARE_PROVIDER_SITE_OTHER): Payer: Medicare Other | Admitting: Pharmacist

## 2016-02-11 ENCOUNTER — Encounter (INDEPENDENT_AMBULATORY_CARE_PROVIDER_SITE_OTHER): Payer: Self-pay

## 2016-02-11 DIAGNOSIS — Z5181 Encounter for therapeutic drug level monitoring: Secondary | ICD-10-CM | POA: Diagnosis not present

## 2016-02-11 DIAGNOSIS — I4891 Unspecified atrial fibrillation: Secondary | ICD-10-CM

## 2016-02-11 LAB — POCT INR: INR: 2.4

## 2016-02-25 ENCOUNTER — Other Ambulatory Visit: Payer: Self-pay | Admitting: Interventional Cardiology

## 2016-02-26 ENCOUNTER — Ambulatory Visit (INDEPENDENT_AMBULATORY_CARE_PROVIDER_SITE_OTHER): Payer: Medicare Other | Admitting: Podiatry

## 2016-02-26 ENCOUNTER — Encounter: Payer: Self-pay | Admitting: Podiatry

## 2016-02-26 VITALS — BP 183/115 | HR 85 | Ht 70.0 in | Wt 206.0 lb

## 2016-02-26 DIAGNOSIS — B351 Tinea unguium: Secondary | ICD-10-CM | POA: Diagnosis not present

## 2016-02-26 DIAGNOSIS — E0843 Diabetes mellitus due to underlying condition with diabetic autonomic (poly)neuropathy: Secondary | ICD-10-CM

## 2016-02-26 DIAGNOSIS — L603 Nail dystrophy: Secondary | ICD-10-CM

## 2016-02-26 DIAGNOSIS — M79609 Pain in unspecified limb: Secondary | ICD-10-CM | POA: Diagnosis not present

## 2016-02-26 DIAGNOSIS — L608 Other nail disorders: Secondary | ICD-10-CM

## 2016-02-26 NOTE — Progress Notes (Signed)
SUBJECTIVE Patient with a history of diabetes mellitus presents to office today complaining of elongated, thickened nails. Pain while ambulating in shoes. Patient is unable to trim their own nails.   Allergies  Allergen Reactions  . Demerol [Meperidine] Other (See Comments)    "hallucinations"    OBJECTIVE General Patient is awake, alert, and oriented x 3 and in no acute distress. Derm Skin is dry and supple bilateral. Negative open lesions or macerations. Remaining integument unremarkable. Nails are tender, long, thickened and dystrophic with subungual debris, consistent with onychomycosis, 1-5 bilateral. No signs of infection noted. Vasc  DP and PT pedal pulses palpable bilaterally. Temperature gradient within normal limits.  Neuro Epicritic and protective threshold sensation diminished bilaterally.  Musculoskeletal Exam No symptomatic pedal deformities noted bilateral. Muscular strength within normal limits.  ASSESSMENT 1. Diabetes Mellitus w/ peripheral neuropathy 2. Onychomycosis of nail due to dermatophyte bilateral 3. Pain in foot bilateral  PLAN OF CARE 1. Patient evaluated today. 2. Instructed to maintain good pedal hygiene and foot care. Stressed importance of controlling blood sugar.  3. Mechanical debridement of nails 1-5 bilaterally performed using a nail nipper. Filed with dremel without incident.  4. Return to clinic in 3 mos.     Edrick Kins, DPM [facility]  Dr. Edrick Kins, [place]                                        [place], [place] [address]                Office [phone]  Fax [phone]

## 2016-03-10 DIAGNOSIS — D225 Melanocytic nevi of trunk: Secondary | ICD-10-CM | POA: Diagnosis not present

## 2016-03-10 DIAGNOSIS — L820 Inflamed seborrheic keratosis: Secondary | ICD-10-CM | POA: Diagnosis not present

## 2016-03-10 DIAGNOSIS — L570 Actinic keratosis: Secondary | ICD-10-CM | POA: Diagnosis not present

## 2016-03-10 DIAGNOSIS — L814 Other melanin hyperpigmentation: Secondary | ICD-10-CM | POA: Diagnosis not present

## 2016-03-10 DIAGNOSIS — Z85828 Personal history of other malignant neoplasm of skin: Secondary | ICD-10-CM | POA: Diagnosis not present

## 2016-03-10 DIAGNOSIS — L821 Other seborrheic keratosis: Secondary | ICD-10-CM | POA: Diagnosis not present

## 2016-03-24 ENCOUNTER — Ambulatory Visit (INDEPENDENT_AMBULATORY_CARE_PROVIDER_SITE_OTHER): Payer: Medicare Other | Admitting: *Deleted

## 2016-03-24 DIAGNOSIS — I4891 Unspecified atrial fibrillation: Secondary | ICD-10-CM

## 2016-03-24 DIAGNOSIS — Z5181 Encounter for therapeutic drug level monitoring: Secondary | ICD-10-CM | POA: Diagnosis not present

## 2016-03-24 LAB — POCT INR: INR: 2.8

## 2016-03-28 DIAGNOSIS — M21372 Foot drop, left foot: Secondary | ICD-10-CM | POA: Diagnosis not present

## 2016-03-28 DIAGNOSIS — I482 Chronic atrial fibrillation: Secondary | ICD-10-CM | POA: Diagnosis not present

## 2016-03-28 DIAGNOSIS — E0842 Diabetes mellitus due to underlying condition with diabetic polyneuropathy: Secondary | ICD-10-CM | POA: Diagnosis not present

## 2016-03-28 DIAGNOSIS — Z952 Presence of prosthetic heart valve: Secondary | ICD-10-CM | POA: Diagnosis not present

## 2016-03-28 DIAGNOSIS — I10 Essential (primary) hypertension: Secondary | ICD-10-CM | POA: Diagnosis not present

## 2016-03-28 DIAGNOSIS — N183 Chronic kidney disease, stage 3 (moderate): Secondary | ICD-10-CM | POA: Diagnosis not present

## 2016-03-28 DIAGNOSIS — Z7984 Long term (current) use of oral hypoglycemic drugs: Secondary | ICD-10-CM | POA: Diagnosis not present

## 2016-03-28 DIAGNOSIS — E1122 Type 2 diabetes mellitus with diabetic chronic kidney disease: Secondary | ICD-10-CM | POA: Diagnosis not present

## 2016-03-28 DIAGNOSIS — I25810 Atherosclerosis of coronary artery bypass graft(s) without angina pectoris: Secondary | ICD-10-CM | POA: Diagnosis not present

## 2016-03-28 DIAGNOSIS — K219 Gastro-esophageal reflux disease without esophagitis: Secondary | ICD-10-CM | POA: Diagnosis not present

## 2016-03-28 DIAGNOSIS — E7800 Pure hypercholesterolemia, unspecified: Secondary | ICD-10-CM | POA: Diagnosis not present

## 2016-03-28 DIAGNOSIS — N400 Enlarged prostate without lower urinary tract symptoms: Secondary | ICD-10-CM | POA: Diagnosis not present

## 2016-04-22 ENCOUNTER — Other Ambulatory Visit: Payer: Self-pay | Admitting: Interventional Cardiology

## 2016-04-30 ENCOUNTER — Other Ambulatory Visit: Payer: Self-pay | Admitting: Interventional Cardiology

## 2016-05-04 NOTE — Progress Notes (Signed)
Patient ID: [name], male   DOB: 08-31-1925, 83 y.o.   MRN: [id number]     Cardiology Office Note   Date:  05/05/2016   ID:  [name], DOB 02-Jul-1925, MRN [id number]  PCP:  Wenda Low, MD    No chief complaint on file.  F/u AFib  Wt Readings from Last 3 Encounters:  05/05/16 210 lb 12.8 oz (95.6 kg)  02/26/16 206 lb (93.4 kg)  07/21/15 206 lb (93.4 kg)       History of Present Illness: [name] is a 83 y.o. male  who has had single vessel CABG and AVR in 2002. He has had AFib. He has to started walking with a walker.  Pacer was placed several years ago, 2014.  He has had normal checks.  No bleeding problems.   He has  Stopped doing a lot of the exercises.  Minimal walking. His wife is willing to remind him.   He is diligent about using his walker to avoid falling.   He denies any chest discomfort, shortness of breath, palpitations. He has chronic right leg swelling. No significant change.  Wears a left leg brace.  He goes out to eat twice a day.  He remains social and tries to be active safely. No falls.  No bleeding issues.    Last hospital stay was in 11/16.        Past Medical History:  Diagnosis Date  . Aortic stenosis   . BPH (benign prostatic hypertrophy)   . Coronary artery disease   . Dermatophytosis of nail   . Diabetes mellitus without complication ([place])    type 2  . Diverticulosis   . DVT (deep venous thrombosis) ([place])   . Gastroesophageal reflux disease   . GERD (gastroesophageal reflux disease)   . Gout   . 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 Kidney stones   . PAH (pulmonary artery hypertension)    PAS 63 on ECHO 09/2012  . Permanent atrial fibrillation ([place])   . PVD (peripheral vascular disease) ([place])   . Shortness of breath   . 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 AORTIC VALVE REPLACEMENT  2006  . BALLOON DILATION  N/A 12/16/2013   Procedure: BALLOON DILATION;  Surgeon: Garlan Fair, MD;  Location: [facility] ENDOSCOPY;  Service: Endoscopy;  Laterality: N/A;  . CORONARY ARTERY BYPASS GRAFT    . ESOPHAGOGASTRODUODENOSCOPY N/A 12/16/2013   Procedure: ESOPHAGOGASTRODUODENOSCOPY (EGD);  Surgeon: Garlan Fair, MD;  Location: [facility] ENDOSCOPY;  Service: Endoscopy;  Laterality: N/A;  . HERNIA REPAIR    . PACEMAKER INSERTION  10-04-2012   Medtronic pacemaker implanted by Dr Rayann Heman for symptomatic bradycardia  . PARAESOPHAGEAL HERNIA REPAIR    . PERMANENT PACEMAKER INSERTION N/A 10/04/2012   Procedure: PERMANENT PACEMAKER INSERTION;  Surgeon: Thompson Grayer, MD;  Location[facility] CATH LAB;  Service: Cardiovascular;  Laterality: N/A;  . TEE WITHOUT CARDIOVERSION N/A 12/25/2014   Procedure: TRANSESOPHAGEAL ECHOCARDIOGRAM (TEE)   (inpatient at [facility]);  Surgeon: Skeet Latch, MD;  Location: [facility] ENDOSCOPY;  Service: Cardiovascular;  Laterality: N/A;     Current Outpatient Prescriptions  Medication Sig Dispense Refill  . Coenzyme Q10 (CO Q 10 PO) Take 1 capsule by mouth daily.    . furosemide (LASIX) 20 MG tablet Take 20 mg by mouth every other day.    [name] glimepiride (AMARYL) 1 MG  tablet Take 1 mg by mouth 2 (two) times daily.     . nitroGLYCERIN (NITROSTAT) 0.4 MG SL tablet Place 1 tablet (0.4 mg total) under the tongue every 5 (five) minutes x 3 doses as needed for chest pain. 25 tablet 6  . ramipril (ALTACE) 2.5 MG capsule TAKE 1 CAPSULE BY MOUTH DAILY. 30 capsule 0  . tamsulosin (FLOMAX) 0.4 MG CAPS capsule Take 1 capsule (0.4 mg total) by mouth daily. 30 capsule 0  . warfarin (COUMADIN) 5 MG tablet TAKE AS DIRECTED BY COUMADIN CLINIC. 25 tablet 3   No current facility-administered medications for this visit.     Allergies:   Demerol [meperidine]    Social History:  The patient  reports that he has never smoked. He has never used smokeless tobacco. He reports that he does not drink alcohol or use drugs.   Family  History:  The patient's family history includes Cancer - Other in his father.    ROS:  Please see the history of present illness.   Otherwise, review of systems are positive for  chronic right leg swelling.   All other systems are reviewed and negative.    PHYSICAL EXAM: VS:  BP 128/72   Pulse 80   Ht 5\' 10"  (1.778 m)   Wt 210 lb 12.8 oz (95.6 kg)   BMI 30.25 kg/m  , BMI Body mass index is 30.25 kg/m. GEN: Well nourished, well developed, in no acute distress  HEENT: normal  Neck: no JVD, carotid bruits, or masses Cardiac:  Regular rate, irregular rhythm; no murmurs, rubs, or gallops; bilateral LE edema ; L>R; left brace in place Respiratory:  clear to auscultation bilaterally, normal work of breathing GI: soft, nontender, nondistended, + BS MS: no deformity or atrophy  Skin: warm and dry, no rash Neuro:  Strength and sensation are intact Psych: euthymic mood, full affect  ECG: V paced rhythm  Recent Labs: No results found for requested labs within last 8760 hours.   Lipid Panel No results found for: CHOL, TRIG, HDL, CHOLHDL, VLDL, LDLCALC, LDLDIRECT   Other studies Reviewed: Additional studies/ records that were reviewed today with results demonstrating:  Normal left ventricular function, moderately dilated left atrium. .   ASSESSMENT AND PLAN:  1. Chronic AFib:   Rate controlled. Continue current medications.  Coumadin for stroke prevention. He follows with our form disease for Coumadin adjustments.  Getting checked today. 2. S/p Pacer:  Functioning normally on most recent check.   3. HTN:  Well controlled. Continue current medicines. 4. DM Type 2: Managed by PMD. 5. S/p AVR- He needs twice a year cleanings on his bottom teeth.  Upper dentures.  He has not been going to the dentist.  He needs SBE prophylaxis prior to dental visits..  6. CAD: Single vessel CABG in 2002.  No angina.   Current medicines are reviewed at length with the patient today.  The patient concerns  regarding his medicines were addressed.  The following changes have been made:  No change  Labs/ tests ordered today include:  No orders of the defined types were placed in this encounter.   Recommend 150 minutes/week of aerobic exercise; avoid falling- use walker at all times Low fat, low carb, high fiber diet recommended  Disposition:   FU in 1 year   Signed, Larae Grooms, MD  05/05/2016 8:20 AM    [facility] [place], [place], [place]  [address] Phone: [phone]; Fax: [phone]

## 2016-05-05 ENCOUNTER — Ambulatory Visit (INDEPENDENT_AMBULATORY_CARE_PROVIDER_SITE_OTHER): Payer: Medicare Other

## 2016-05-05 ENCOUNTER — Ambulatory Visit (INDEPENDENT_AMBULATORY_CARE_PROVIDER_SITE_OTHER): Payer: Medicare Other | Admitting: Interventional Cardiology

## 2016-05-05 ENCOUNTER — Encounter: Payer: Self-pay | Admitting: Interventional Cardiology

## 2016-05-05 VITALS — BP 128/72 | HR 80 | Ht 70.0 in | Wt 210.8 lb

## 2016-05-05 DIAGNOSIS — Z952 Presence of prosthetic heart valve: Secondary | ICD-10-CM

## 2016-05-05 DIAGNOSIS — I4891 Unspecified atrial fibrillation: Secondary | ICD-10-CM

## 2016-05-05 DIAGNOSIS — Z950 Presence of cardiac pacemaker: Secondary | ICD-10-CM | POA: Diagnosis not present

## 2016-05-05 DIAGNOSIS — E1159 Type 2 diabetes mellitus with other circulatory complications: Secondary | ICD-10-CM | POA: Diagnosis not present

## 2016-05-05 DIAGNOSIS — I2510 Atherosclerotic heart disease of native coronary artery without angina pectoris: Secondary | ICD-10-CM

## 2016-05-05 DIAGNOSIS — Z5181 Encounter for therapeutic drug level monitoring: Secondary | ICD-10-CM

## 2016-05-05 LAB — POCT INR: INR: 2.6

## 2016-05-05 NOTE — Patient Instructions (Signed)
Medication Instructions:  Your physician recommends that you continue on your current medications as directed. Please refer to the Current Medication list given to you today.   Labwork: None ordered.  Testing/Procedures: None ordered.  Follow-Up: Your physician wants you to follow-up in: 1 year with Dr. Irish Lack. You will receive a reminder letter in the mail two months in advance. If you don't receive a letter, please call our office to schedule the follow-up appointment.   Any Other Special Instructions Will Be Listed Below (If Applicable).  You need to see the dentist twice a year.  Your physician discussed the importance of taking an antibiotic prior to any dental, gastrointestinal, genitourinary procedures to prevent damage to the heart valves from infection. You were given a prescription for an antibiotic based on current SBE prophylaxis guidelines.       If you need a refill on your cardiac medications before your next appointment, please call your pharmacy.

## 2016-05-24 ENCOUNTER — Telehealth: Payer: Self-pay | Admitting: Nurse Practitioner

## 2016-05-24 NOTE — Telephone Encounter (Signed)
lmom for pt to call and schedule past due pacer check with [employer]

## 2016-05-26 ENCOUNTER — Encounter: Payer: Self-pay | Admitting: Podiatry

## 2016-05-26 ENCOUNTER — Ambulatory Visit (INDEPENDENT_AMBULATORY_CARE_PROVIDER_SITE_OTHER): Payer: Medicare Other | Admitting: Podiatry

## 2016-05-26 DIAGNOSIS — M79609 Pain in unspecified limb: Secondary | ICD-10-CM

## 2016-05-26 DIAGNOSIS — B351 Tinea unguium: Secondary | ICD-10-CM

## 2016-05-26 DIAGNOSIS — E0843 Diabetes mellitus due to underlying condition with diabetic autonomic (poly)neuropathy: Secondary | ICD-10-CM

## 2016-05-26 NOTE — Progress Notes (Signed)
Complaint:  Visit Type: Patient returns to my office for continued preventative foot care services. Complaint: Patient states" my nails have grown long and thick and become painful to walk and wear shoes" Patient has been diagnosed with DM with no foot neuropathy.. The patient presents for preventative foot care services. No changes to ROS  Podiatric Exam: Vascular: dorsalis pedis and posterior tibial pulses are not  palpable bilatera due to severe swelling both feet.. Capillary return is immediate. Temperature gradient is WNL. Skin turgor WNL  Sensorium: Diminished  Semmes Weinstein monofilament test. Normal tactile sensation bilaterally. Nail Exam: Pt has thick disfigured discolored nails with subungual debris noted bilateral entire nail hallux through fifth toenails Ulcer Exam: There is no evidence of ulcer or pre-ulcerative changes or infection. Orthopedic Exam: Muscle tone and strength are WNL. No limitations in general ROM. No crepitus or effusions noted. Foot type and digits show no abnormalities. Bony prominences are unremarkable. Skin: No Porokeratosis. No infection or ulcers  Diagnosis:  Onychomycosis, , Pain in right toe, pain in left toes  Treatment & Plan Procedures and Treatment: Consent by patient was obtained for treatment procedures. The patient understood the discussion of treatment and procedures well. All questions were answered thoroughly reviewed. Debridement of mycotic and hypertrophic toenails, 1 through 5 bilateral and clearing of subungual debris. No ulceration, no infection noted.  Return Visit-Office Procedure: Patient instructed to return to the office for a follow up visit 3 months for continued evaluation and treatment.    Yorel Redder DPM 

## 2016-05-27 ENCOUNTER — Ambulatory Visit: Payer: Medicare Other | Admitting: Podiatry

## 2016-06-05 ENCOUNTER — Other Ambulatory Visit: Payer: Self-pay | Admitting: Interventional Cardiology

## 2016-06-16 ENCOUNTER — Ambulatory Visit (INDEPENDENT_AMBULATORY_CARE_PROVIDER_SITE_OTHER): Payer: Medicare Other | Admitting: Pharmacist

## 2016-06-16 DIAGNOSIS — Z5181 Encounter for therapeutic drug level monitoring: Secondary | ICD-10-CM

## 2016-06-16 DIAGNOSIS — I2510 Atherosclerotic heart disease of native coronary artery without angina pectoris: Secondary | ICD-10-CM

## 2016-06-16 DIAGNOSIS — I4891 Unspecified atrial fibrillation: Secondary | ICD-10-CM | POA: Diagnosis not present

## 2016-06-16 LAB — POCT INR: INR: 2.5

## 2016-06-20 ENCOUNTER — Encounter: Payer: Self-pay | Admitting: Internal Medicine

## 2016-06-21 ENCOUNTER — Ambulatory Visit (INDEPENDENT_AMBULATORY_CARE_PROVIDER_SITE_OTHER): Payer: Medicare Other | Admitting: Internal Medicine

## 2016-06-21 ENCOUNTER — Encounter: Payer: Self-pay | Admitting: Internal Medicine

## 2016-06-21 VITALS — BP 110/68 | HR 78 | Ht 70.0 in | Wt 210.0 lb

## 2016-06-21 DIAGNOSIS — R001 Bradycardia, unspecified: Secondary | ICD-10-CM | POA: Diagnosis not present

## 2016-06-21 DIAGNOSIS — I482 Chronic atrial fibrillation, unspecified: Secondary | ICD-10-CM

## 2016-06-21 DIAGNOSIS — N401 Enlarged prostate with lower urinary tract symptoms: Secondary | ICD-10-CM | POA: Diagnosis not present

## 2016-06-21 DIAGNOSIS — I10 Essential (primary) hypertension: Secondary | ICD-10-CM | POA: Diagnosis not present

## 2016-06-21 DIAGNOSIS — R3914 Feeling of incomplete bladder emptying: Secondary | ICD-10-CM | POA: Diagnosis not present

## 2016-06-21 DIAGNOSIS — I2510 Atherosclerotic heart disease of native coronary artery without angina pectoris: Secondary | ICD-10-CM

## 2016-06-21 NOTE — Patient Instructions (Addendum)
Medication Instructions:  Your physician recommends that you continue on your current medications as directed. Please refer to the Current Medication list given to you today.    Labwork: None ordered   Testing/Procedures: None ordered'  Follow-Up: Your physician wants you to follow-up in: 12 months with Amber Seiler, NP. You will receive a reminder letter in the mail two months in advance. If you don't receive a letter, please call our office to schedule the follow-up appointment.   Any Other Special Instructions Will Be Listed Below (If Applicable).     If you need a refill on your cardiac medications before your next appointment, please call your pharmacy.   

## 2016-06-21 NOTE — Progress Notes (Signed)
Primary Cardiologist:  Dr Irish Lack PCP: Wenda Low, MD  [name] is a 83 y.o. male who presents today for routine electrophysiology followup.  Since his last visit, the patient reports doing very well. He remains active for her age. He had staph aureus bacteremia 2016 (Dr Comer's ID notes reviewed).  He was treated with antibiotics but device system removal was not advised.  Thankfully, he has done well since that time, without any symptoms. Today, he denies symptoms of palpitations, chest pain, presyncope, or syncope.  Shortness of breath, lower extremity edema, and dizziness are stable.  The patient is otherwise without complaint today.   Past Medical History:  Diagnosis Date  . Aortic stenosis   . BPH (benign prostatic hypertrophy)   . Coronary artery disease   . Dermatophytosis of nail   . Diabetes mellitus without complication ([place])    type 2  . Diverticulosis   . DVT (deep venous thrombosis) ([place])   . Gastroesophageal reflux disease   . GERD (gastroesophageal reflux disease)   . Gout   . 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 Kidney stones   . PAH (pulmonary artery hypertension) ([place])    PAS 63 on ECHO 09/2012  . Permanent atrial fibrillation ([place])   . PVD (peripheral vascular disease) ([place])   . Shortness of breath   . Symptomatic bradycardia 09-2012   s/p Medtronic pacemaker implanted by Dr Rayann Heman    Past Surgical History:  Procedure Laterality Date  . AORTIC VALVE REPLACEMENT  2006  . BALLOON DILATION N/A 12/16/2013   Procedure: BALLOON DILATION;  Surgeon: Garlan Fair, MD;  Location: [facility] ENDOSCOPY;  Service: Endoscopy;  Laterality: N/A;  . CORONARY ARTERY BYPASS GRAFT    . ESOPHAGOGASTRODUODENOSCOPY N/A 12/16/2013   Procedure: ESOPHAGOGASTRODUODENOSCOPY (EGD);  Surgeon: Garlan Fair, MD;  Location: [facility];  Service: Endoscopy;  Laterality: N/A;  . HERNIA REPAIR    . PACEMAKER INSERTION  10-04-2012   Medtronic pacemaker implanted by Dr Rayann Heman for  symptomatic bradycardia  . PARAESOPHAGEAL HERNIA REPAIR    . PERMANENT PACEMAKER INSERTION N/A 10/04/2012   Procedure: PERMANENT PACEMAKER INSERTION;  Surgeon: Thompson Grayer, MD;  Location: [facility] CATH LAB;  Service: Cardiovascular;  Laterality: N/A;  . TEE WITHOUT CARDIOVERSION N/A 12/25/2014   Procedure: TRANSESOPHAGEAL ECHOCARDIOGRAM (TEE)   (inpatient at [facility]);  Surgeon: Skeet Latch, MD;  Location: [facility] ENDOSCOPY;  Service: Cardiovascular;  Laterality: N/A;    Current Outpatient Prescriptions  Medication Sig Dispense Refill  . Coenzyme Q10 (CO Q 10 PO) Take 1 capsule by mouth daily.    . furosemide (LASIX) 20 MG tablet Take 20 mg by mouth every other day.    [name] glimepiride (AMARYL) 1 MG tablet Take 1 mg by mouth 2 (two) times daily.     . nitroGLYCERIN (NITROSTAT) 0.4 MG SL tablet Place 1 tablet (0.4 mg total) under the tongue every 5 (five) minutes x 3 doses as needed for chest pain. 25 tablet 6  . ramipril (ALTACE) 2.5 MG capsule TAKE ONE CAPSULE BY MOUTH EVERY DAY 30 capsule 0  . tamsulosin (FLOMAX) 0.4 MG CAPS capsule Take 1 capsule (0.4 mg total) by mouth daily. 30 capsule 0  . warfarin (COUMADIN) 5 MG tablet TAKE AS DIRECTED BY COUMADIN CLINIC. 25 tablet 3   No current facility-administered medications for this visit.    ROS- all systems are reviewed and negative except as per HPI above.  In addition, he has recently had esophageal dilatation,  +  leg weakness/neuropathy, difficulty urinating  Physical Exam: Vitals:   06/21/16 1449  BP: 110/68  Pulse: 78  Weight: 210 lb (95.3 kg)  Height: 5\' 10"  (1.778 m)    GEN- The patient is elderly appearing, alert and oriented x 3 today.   Head- normocephalic, atraumatic Eyes-  Sclera clear, conjunctiva pink Ears- hearing intact Oropharynx- clear Lungs- Clear to ausculation bilaterally, normal work of breathing Chest- pacemaker pocket is well healed Heart- Regular rate and rhythm (paced) GI- soft, NT, ND, + BS Extremities- no  clubbing, cyanosis, + chronic dependant edema  Pacemaker interrogation- personally reviewed in detail today,  See PACEART report  Assessment and Plan:  1. Symptomatic bradycardia Normal pacemaker function See Pace Art report No changes today  2. HTN Stable No change required today  3. Permanent afib On coumadin for afib and prior DVT chads2vasc score is at least 5  Does not do remotes currently Return to see EP NP in 1 year  Follow-up with Dr Irish Lack as scheduled  Thompson Grayer MD, [facility] 06/21/2016 3:11 PM

## 2016-07-01 ENCOUNTER — Other Ambulatory Visit: Payer: Self-pay | Admitting: Interventional Cardiology

## 2016-07-05 ENCOUNTER — Other Ambulatory Visit: Payer: Self-pay | Admitting: Internal Medicine

## 2016-07-05 DIAGNOSIS — E119 Type 2 diabetes mellitus without complications: Secondary | ICD-10-CM | POA: Diagnosis not present

## 2016-07-19 ENCOUNTER — Telehealth: Payer: Self-pay | Admitting: Interventional Cardiology

## 2016-07-19 NOTE — Telephone Encounter (Signed)
New message      Wife want to know if Dr Rayann Heman will let pt have cataract surgery since he cannot be put to sleep.  She is not wanting clearance, just asking if he can have it?  Instead of asking Dr Irish Lack, she want to ask Dr Rayann Heman.  If ok, pt will schedule appt with eye doctor and they will send for clearance

## 2016-07-20 NOTE — Telephone Encounter (Signed)
Discuseed with Dr Rayann Heman ok to proceed if needed.  Left message for patient on voicemail

## 2016-07-26 DIAGNOSIS — I10 Essential (primary) hypertension: Secondary | ICD-10-CM | POA: Diagnosis not present

## 2016-07-26 DIAGNOSIS — M48061 Spinal stenosis, lumbar region without neurogenic claudication: Secondary | ICD-10-CM | POA: Diagnosis not present

## 2016-07-26 DIAGNOSIS — K259 Gastric ulcer, unspecified as acute or chronic, without hemorrhage or perforation: Secondary | ICD-10-CM | POA: Diagnosis not present

## 2016-07-26 DIAGNOSIS — Z7984 Long term (current) use of oral hypoglycemic drugs: Secondary | ICD-10-CM | POA: Diagnosis not present

## 2016-07-26 DIAGNOSIS — Z950 Presence of cardiac pacemaker: Secondary | ICD-10-CM | POA: Diagnosis not present

## 2016-07-26 DIAGNOSIS — E1122 Type 2 diabetes mellitus with diabetic chronic kidney disease: Secondary | ICD-10-CM | POA: Diagnosis not present

## 2016-07-26 DIAGNOSIS — M21372 Foot drop, left foot: Secondary | ICD-10-CM | POA: Diagnosis not present

## 2016-07-26 DIAGNOSIS — I482 Chronic atrial fibrillation: Secondary | ICD-10-CM | POA: Diagnosis not present

## 2016-07-26 DIAGNOSIS — N183 Chronic kidney disease, stage 3 (moderate): Secondary | ICD-10-CM | POA: Diagnosis not present

## 2016-07-26 DIAGNOSIS — Z952 Presence of prosthetic heart valve: Secondary | ICD-10-CM | POA: Diagnosis not present

## 2016-07-26 DIAGNOSIS — E0842 Diabetes mellitus due to underlying condition with diabetic polyneuropathy: Secondary | ICD-10-CM | POA: Diagnosis not present

## 2016-07-26 DIAGNOSIS — I25810 Atherosclerosis of coronary artery bypass graft(s) without angina pectoris: Secondary | ICD-10-CM | POA: Diagnosis not present

## 2016-07-28 ENCOUNTER — Ambulatory Visit (INDEPENDENT_AMBULATORY_CARE_PROVIDER_SITE_OTHER): Payer: Medicare Other | Admitting: *Deleted

## 2016-07-28 DIAGNOSIS — I2510 Atherosclerotic heart disease of native coronary artery without angina pectoris: Secondary | ICD-10-CM | POA: Diagnosis not present

## 2016-07-28 DIAGNOSIS — Z5181 Encounter for therapeutic drug level monitoring: Secondary | ICD-10-CM | POA: Diagnosis not present

## 2016-07-28 DIAGNOSIS — I4891 Unspecified atrial fibrillation: Secondary | ICD-10-CM

## 2016-07-28 LAB — POCT INR: INR: 3.3

## 2016-08-02 DIAGNOSIS — H25013 Cortical age-related cataract, bilateral: Secondary | ICD-10-CM | POA: Diagnosis not present

## 2016-08-02 DIAGNOSIS — H2513 Age-related nuclear cataract, bilateral: Secondary | ICD-10-CM | POA: Diagnosis not present

## 2016-08-02 DIAGNOSIS — H2512 Age-related nuclear cataract, left eye: Secondary | ICD-10-CM | POA: Diagnosis not present

## 2016-08-02 DIAGNOSIS — H25043 Posterior subcapsular polar age-related cataract, bilateral: Secondary | ICD-10-CM | POA: Diagnosis not present

## 2016-08-02 DIAGNOSIS — H02839 Dermatochalasis of unspecified eye, unspecified eyelid: Secondary | ICD-10-CM | POA: Diagnosis not present

## 2016-08-09 ENCOUNTER — Telehealth: Payer: Self-pay

## 2016-08-09 NOTE — Telephone Encounter (Signed)
LMOM for eye center, we typically do not hold Coumadin for cataract removals.

## 2016-08-09 NOTE — Telephone Encounter (Signed)
Eye center returned call and verbalized agreement that pt does not need to hold Coumadin prior to cataract removal. Clearance faxed back to eye center for pt to continue taking Coumadin.

## 2016-08-09 NOTE — Telephone Encounter (Signed)
Request for surgical clearance:  1. What type of surgery is being performed? Cataract Extraction with intraocular lens implantation of the left eye followed by the right eye under topical anesthesia  2. When is this surgery scheduled? 09/19/16   3. Are there any medications that need to be held prior to surgery and how long? coumadin   4. Name of physician performing surgery? Dr. Talbert Forest   5. What is your office phone and fax number? P- [phone], [phone]

## 2016-08-09 NOTE — Telephone Encounter (Signed)
Agreed.  If they insist on holding Coumadin, we can given them recommendation based on our criteria.

## 2016-08-25 ENCOUNTER — Ambulatory Visit (INDEPENDENT_AMBULATORY_CARE_PROVIDER_SITE_OTHER): Payer: Medicare Other

## 2016-08-25 DIAGNOSIS — Z5181 Encounter for therapeutic drug level monitoring: Secondary | ICD-10-CM | POA: Diagnosis not present

## 2016-08-25 DIAGNOSIS — I2510 Atherosclerotic heart disease of native coronary artery without angina pectoris: Secondary | ICD-10-CM

## 2016-08-25 DIAGNOSIS — I4891 Unspecified atrial fibrillation: Secondary | ICD-10-CM | POA: Diagnosis not present

## 2016-08-25 LAB — POCT INR: INR: 2.6

## 2016-09-01 ENCOUNTER — Ambulatory Visit: Payer: Medicare Other | Admitting: Podiatry

## 2016-09-02 ENCOUNTER — Inpatient Hospital Stay (HOSPITAL_COMMUNITY)
Admission: EM | Admit: 2016-09-02 | Discharge: 2016-09-06 | DRG: 872 | Disposition: A | Discharge status: Skilled Nursing Facility | Payer: Medicare Other | Attending: Internal Medicine | Admitting: Internal Medicine

## 2016-09-02 ENCOUNTER — Emergency Department (HOSPITAL_COMMUNITY): Payer: Medicare Other

## 2016-09-02 ENCOUNTER — Encounter (HOSPITAL_COMMUNITY): Payer: Self-pay | Admitting: Emergency Medicine

## 2016-09-02 DIAGNOSIS — R791 Abnormal coagulation profile: Secondary | ICD-10-CM | POA: Diagnosis present

## 2016-09-02 DIAGNOSIS — R079 Chest pain, unspecified: Secondary | ICD-10-CM | POA: Diagnosis not present

## 2016-09-02 DIAGNOSIS — I2510 Atherosclerotic heart disease of native coronary artery without angina pectoris: Secondary | ICD-10-CM | POA: Diagnosis not present

## 2016-09-02 DIAGNOSIS — L0889 Other specified local infections of the skin and subcutaneous tissue: Secondary | ICD-10-CM | POA: Diagnosis not present

## 2016-09-02 DIAGNOSIS — L03115 Cellulitis of right lower limb: Secondary | ICD-10-CM | POA: Diagnosis present

## 2016-09-02 DIAGNOSIS — E860 Dehydration: Secondary | ICD-10-CM | POA: Diagnosis present

## 2016-09-02 DIAGNOSIS — R0602 Shortness of breath: Secondary | ICD-10-CM | POA: Diagnosis not present

## 2016-09-02 DIAGNOSIS — Z950 Presence of cardiac pacemaker: Secondary | ICD-10-CM

## 2016-09-02 DIAGNOSIS — I10 Essential (primary) hypertension: Secondary | ICD-10-CM | POA: Diagnosis present

## 2016-09-02 DIAGNOSIS — I4891 Unspecified atrial fibrillation: Secondary | ICD-10-CM | POA: Diagnosis present

## 2016-09-02 DIAGNOSIS — R6510 Systemic inflammatory response syndrome (SIRS) of non-infectious origin without acute organ dysfunction: Secondary | ICD-10-CM

## 2016-09-02 DIAGNOSIS — Z952 Presence of prosthetic heart valve: Secondary | ICD-10-CM

## 2016-09-02 DIAGNOSIS — S40869A Insect bite (nonvenomous) of unspecified upper arm, initial encounter: Secondary | ICD-10-CM | POA: Diagnosis present

## 2016-09-02 DIAGNOSIS — Z7901 Long term (current) use of anticoagulants: Secondary | ICD-10-CM

## 2016-09-02 DIAGNOSIS — Z86718 Personal history of other venous thrombosis and embolism: Secondary | ICD-10-CM | POA: Diagnosis not present

## 2016-09-02 DIAGNOSIS — I482 Chronic atrial fibrillation: Secondary | ICD-10-CM | POA: Diagnosis not present

## 2016-09-02 DIAGNOSIS — E1159 Type 2 diabetes mellitus with other circulatory complications: Secondary | ICD-10-CM | POA: Diagnosis present

## 2016-09-02 DIAGNOSIS — N400 Enlarged prostate without lower urinary tract symptoms: Secondary | ICD-10-CM | POA: Diagnosis present

## 2016-09-02 DIAGNOSIS — N183 Chronic kidney disease, stage 3 unspecified: Secondary | ICD-10-CM | POA: Diagnosis present

## 2016-09-02 DIAGNOSIS — N189 Chronic kidney disease, unspecified: Secondary | ICD-10-CM | POA: Diagnosis present

## 2016-09-02 DIAGNOSIS — I129 Hypertensive chronic kidney disease with stage 1 through stage 4 chronic kidney disease, or unspecified chronic kidney disease: Secondary | ICD-10-CM | POA: Diagnosis present

## 2016-09-02 DIAGNOSIS — Z79899 Other long term (current) drug therapy: Secondary | ICD-10-CM

## 2016-09-02 DIAGNOSIS — L03039 Cellulitis of unspecified toe: Secondary | ICD-10-CM | POA: Diagnosis not present

## 2016-09-02 DIAGNOSIS — R6520 Severe sepsis without septic shock: Secondary | ICD-10-CM | POA: Diagnosis not present

## 2016-09-02 DIAGNOSIS — N179 Acute kidney failure, unspecified: Secondary | ICD-10-CM | POA: Diagnosis present

## 2016-09-02 DIAGNOSIS — W57XXXA Bitten or stung by nonvenomous insect and other nonvenomous arthropods, initial encounter: Secondary | ICD-10-CM | POA: Diagnosis not present

## 2016-09-02 DIAGNOSIS — M109 Gout, unspecified: Secondary | ICD-10-CM | POA: Diagnosis present

## 2016-09-02 DIAGNOSIS — I2721 Secondary pulmonary arterial hypertension: Secondary | ICD-10-CM | POA: Diagnosis present

## 2016-09-02 DIAGNOSIS — E1151 Type 2 diabetes mellitus with diabetic peripheral angiopathy without gangrene: Secondary | ICD-10-CM | POA: Diagnosis present

## 2016-09-02 DIAGNOSIS — Z951 Presence of aortocoronary bypass graft: Secondary | ICD-10-CM

## 2016-09-02 DIAGNOSIS — L0390 Cellulitis, unspecified: Secondary | ICD-10-CM | POA: Diagnosis present

## 2016-09-02 DIAGNOSIS — L03019 Cellulitis of unspecified finger: Secondary | ICD-10-CM | POA: Diagnosis not present

## 2016-09-02 DIAGNOSIS — K219 Gastro-esophageal reflux disease without esophagitis: Secondary | ICD-10-CM | POA: Diagnosis present

## 2016-09-02 DIAGNOSIS — A419 Sepsis, unspecified organism: Secondary | ICD-10-CM

## 2016-09-02 DIAGNOSIS — E1122 Type 2 diabetes mellitus with diabetic chronic kidney disease: Secondary | ICD-10-CM | POA: Diagnosis present

## 2016-09-02 DIAGNOSIS — Z7984 Long term (current) use of oral hypoglycemic drugs: Secondary | ICD-10-CM | POA: Diagnosis not present

## 2016-09-02 DIAGNOSIS — I481 Persistent atrial fibrillation: Secondary | ICD-10-CM | POA: Diagnosis not present

## 2016-09-02 DIAGNOSIS — R509 Fever, unspecified: Secondary | ICD-10-CM | POA: Diagnosis not present

## 2016-09-02 DIAGNOSIS — E119 Type 2 diabetes mellitus without complications: Secondary | ICD-10-CM

## 2016-09-02 HISTORY — DX: Sepsis, unspecified organism: R65.20

## 2016-09-02 HISTORY — DX: Sepsis, unspecified organism: A41.9

## 2016-09-02 LAB — COMPREHENSIVE METABOLIC PANEL
ALBUMIN: 3.8 g/dL (ref 3.5–5.0)
ALT: 14 U/L — AB (ref 17–63)
AST: 24 U/L (ref 15–41)
Alkaline Phosphatase: 49 U/L (ref 38–126)
Anion gap: 9 (ref 5–15)
BUN: 30 mg/dL — AB (ref 6–20)
CHLORIDE: 100 mmol/L — AB (ref 101–111)
CO2: 26 mmol/L (ref 22–32)
CREATININE: 2.01 mg/dL — AB (ref 0.61–1.24)
Calcium: 8.9 mg/dL (ref 8.9–10.3)
GFR calc Af Amer: 32 mL/min — ABNORMAL LOW (ref >60)
GFR, EST NON AFRICAN AMERICAN: 28 mL/min — AB (ref >60)
GLUCOSE: 168 mg/dL — AB (ref 65–99)
Potassium: 4.2 mmol/L (ref 3.5–5.1)
Sodium: 135 mmol/L (ref 135–145)
Total Bilirubin: 1.6 mg/dL — ABNORMAL HIGH (ref 0.3–1.2)
Total Protein: 6.8 g/dL (ref 6.5–8.1)

## 2016-09-02 LAB — CBC WITH DIFFERENTIAL/PLATELET
Basophils Absolute: 0 10*3/uL (ref 0.0–0.1)
Basophils Relative: 0 %
Eosinophils Absolute: 0 10*3/uL (ref 0.0–0.7)
Eosinophils Relative: 0 %
HEMATOCRIT: 44.1 % (ref 39.0–52.0)
HEMOGLOBIN: 14.1 g/dL (ref 13.0–17.0)
LYMPHS ABS: 0.8 10*3/uL (ref 0.7–4.0)
LYMPHS PCT: 8 %
MCH: 31.4 pg (ref 26.0–34.0)
MCHC: 32 g/dL (ref 30.0–36.0)
MCV: 98.2 fL (ref 78.0–100.0)
MONO ABS: 0.5 10*3/uL (ref 0.1–1.0)
MONOS PCT: 5 %
NEUTROS ABS: 8.6 10*3/uL — AB (ref 1.7–7.7)
NEUTROS PCT: 87 %
Platelets: 145 10*3/uL — ABNORMAL LOW (ref 150–400)
RBC: 4.49 MIL/uL (ref 4.22–5.81)
RDW: 13.6 % (ref 11.5–15.5)
WBC: 9.8 10*3/uL (ref 4.0–10.5)

## 2016-09-02 LAB — GLUCOSE, CAPILLARY: Glucose-Capillary: 213 mg/dL — ABNORMAL HIGH (ref 65–99)

## 2016-09-02 LAB — PROTIME-INR
INR: 3.49
Prothrombin Time: 35.9 seconds — ABNORMAL HIGH (ref 11.4–15.2)

## 2016-09-02 LAB — I-STAT CG4 LACTIC ACID, ED
LACTIC ACID, VENOUS: 2.01 mmol/L — AB (ref 0.5–1.9)
Lactic Acid, Venous: 3.64 mmol/L (ref 0.5–1.9)
Lactic Acid, Venous: 2.76 mmol/L (ref 0.5–1.9)

## 2016-09-02 MED ORDER — SODIUM CHLORIDE 0.9 % IV BOLUS (SEPSIS)
1000.0000 mL | Freq: Once | INTRAVENOUS | Status: AC
  Administered 2016-09-02: 1000 mL via INTRAVENOUS

## 2016-09-02 MED ORDER — SODIUM CHLORIDE 0.9% FLUSH
3.0000 mL | Freq: Two times a day (BID) | INTRAVENOUS | Status: DC
  Administered 2016-09-02 – 2016-09-05 (×6): 3 mL via INTRAVENOUS

## 2016-09-02 MED ORDER — DOXYCYCLINE HYCLATE 100 MG PO TABS
100.0000 mg | ORAL_TABLET | Freq: Two times a day (BID) | ORAL | Status: DC
  Administered 2016-09-03 – 2016-09-06 (×7): 100 mg via ORAL
  Filled 2016-09-02 (×8): qty 1

## 2016-09-02 MED ORDER — ACETAMINOPHEN 650 MG RE SUPP: 650.0000 mg | Freq: Four times a day (QID) | RECTAL | Status: DC | PRN

## 2016-09-02 MED ORDER — SODIUM CHLORIDE 0.9 % IV SOLN
INTRAVENOUS | Status: AC
  Administered 2016-09-02: 23:00:00 via INTRAVENOUS

## 2016-09-02 MED ORDER — INSULIN ASPART 100 UNIT/ML ~~LOC~~ SOLN
0.0000 [IU] | Freq: Every day | SUBCUTANEOUS | Status: DC
  Administered 2016-09-02: 2 [IU] via SUBCUTANEOUS

## 2016-09-02 MED ORDER — ONDANSETRON HCL 4 MG PO TABS: 4.0000 mg | ORAL_TABLET | Freq: Four times a day (QID) | ORAL | Status: DC | PRN

## 2016-09-02 MED ORDER — PIPERACILLIN-TAZOBACTAM 3.375 G IVPB 30 MIN
3.3750 g | Freq: Once | INTRAVENOUS | Status: AC
  Administered 2016-09-02: 3.375 g via INTRAVENOUS
  Filled 2016-09-02: qty 50

## 2016-09-02 MED ORDER — VANCOMYCIN HCL IN DEXTROSE 1-5 GM/200ML-% IV SOLN
1000.0000 mg | Freq: Once | INTRAVENOUS | Status: AC
  Administered 2016-09-02: 1000 mg via INTRAVENOUS
  Filled 2016-09-02: qty 200

## 2016-09-02 MED ORDER — ONDANSETRON HCL 4 MG/2ML IJ SOLN: 4.0000 mg | Freq: Four times a day (QID) | INTRAMUSCULAR | Status: DC | PRN

## 2016-09-02 MED ORDER — ACETAMINOPHEN 325 MG PO TABS
650.0000 mg | ORAL_TABLET | Freq: Once | ORAL | Status: AC
Start: 2016-09-02 — End: 2016-09-02
  Administered 2016-09-02: 650 mg via ORAL
  Filled 2016-09-02: qty 2

## 2016-09-02 MED ORDER — PIPERACILLIN-TAZOBACTAM 3.375 G IVPB
3.3750 g | Freq: Three times a day (TID) | INTRAVENOUS | Status: DC
  Administered 2016-09-03 – 2016-09-05 (×8): 3.375 g via INTRAVENOUS
  Filled 2016-09-02 (×9): qty 50

## 2016-09-02 MED ORDER — DOXYCYCLINE HYCLATE 100 MG IV SOLR
100.0000 mg | Freq: Once | INTRAVENOUS | Status: AC
  Administered 2016-09-02: 100 mg via INTRAVENOUS
  Filled 2016-09-02: qty 100

## 2016-09-02 MED ORDER — VANCOMYCIN HCL IN DEXTROSE 1-5 GM/200ML-% IV SOLN
1000.0000 mg | INTRAVENOUS | Status: DC
  Administered 2016-09-03 – 2016-09-04 (×2): 1000 mg via INTRAVENOUS
  Filled 2016-09-02 (×3): qty 200

## 2016-09-02 MED ORDER — INSULIN ASPART 100 UNIT/ML ~~LOC~~ SOLN
0.0000 [IU] | Freq: Three times a day (TID) | SUBCUTANEOUS | Status: DC
  Administered 2016-09-03: 1 [IU] via SUBCUTANEOUS
  Administered 2016-09-04 – 2016-09-05 (×2): 2 [IU] via SUBCUTANEOUS
  Administered 2016-09-06: 1 [IU] via SUBCUTANEOUS

## 2016-09-02 MED ORDER — HYDROCODONE-ACETAMINOPHEN 5-325 MG PO TABS: 1.0000 | ORAL_TABLET | ORAL | Status: DC | PRN

## 2016-09-02 MED ORDER — ACETAMINOPHEN 325 MG PO TABS
650.0000 mg | ORAL_TABLET | Freq: Four times a day (QID) | ORAL | Status: DC | PRN
  Administered 2016-09-03 – 2016-09-05 (×4): 650 mg via ORAL
  Filled 2016-09-02 (×4): qty 2

## 2016-09-02 MED ORDER — WARFARIN - PHARMACIST DOSING INPATIENT: Freq: Every day | Status: DC

## 2016-09-02 NOTE — ED Notes (Signed)
Waiting for IV team °

## 2016-09-02 NOTE — Progress Notes (Addendum)
ANTICOAGULATION CONSULT NOTE - Initial Consult  Pharmacy Consult for warfarin (continued from PTA) Indication: hx of atrial fibrillation  Allergies  Allergen Reactions  . Demerol [Meperidine] Other (See Comments)    Causes hallucinations    Patient Measurements: Height: 6' (182.9 cm) Weight: 210 lb 12.2 oz (95.6 kg) IBW/kg (Calculated) : 77.6  Vital Signs: Temp: 98.3 F (36.8 C) (07/06 2240) Temp Source: Oral (07/06 2240) BP: 106/56 (07/06 2240) Pulse Rate: 60 (07/06 2240)  Labs:  Recent Labs  09/02/16 1553  HGB 14.1  HCT 44.1  PLT 145*  LABPROT 35.9*  INR 3.49  CREATININE 2.01*    Estimated Creatinine Clearance: 30.5 mL/min (A) (by C-G formula based on SCr of 2.01 mg/dL (H)).   Medical History: Past Medical History:  Diagnosis Date  . Aortic stenosis   . BPH (benign prostatic hypertrophy)   . Coronary artery disease   . Dermatophytosis of nail   . Diabetes mellitus without complication ([place])    type 2  . Diverticulosis   . DVT (deep venous thrombosis) ([place])   . Gastroesophageal reflux disease   . GERD (gastroesophageal reflux disease)   . Gout   . 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 Kidney stones   . PAH (pulmonary artery hypertension) ([place])    PAS 63 on ECHO 09/2012  . Permanent atrial fibrillation ([place])   . PVD (peripheral vascular disease) ([place])   . Shortness of breath   . Symptomatic bradycardia 09-2012   s/p Medtronic pacemaker implanted by Dr Rayann Heman    Assessment: 83 yo male admitted with fever and chills. On PTA warfarin for afib. Pharmacy consulted to continue here. Last dose of warfarin PTA was on 7/5 at [facility]. INR today is supratherapeutic at 3.49. Hgb normal and platelets slightly low at 145. No overt s/s bleeding noted.   PTA warfarin dose: 5mg  on Tues and 2.5mg  on all other days   Goal of Therapy:  INR 2-3 Monitor platelets by anticoagulation protocol: Yes   Plan:  Hold warfarin dose tonight in setting of sepsis and new abx  F/u  INR in AM Daily INR and CBC as needed Monitor for s/s bleeding  Monitor for drug interactions with Doxycycline  Argie Ramming, PharmD Clinical Pharmacist 09/02/16 11:01 PM

## 2016-09-02 NOTE — Progress Notes (Signed)
Pharmacy Antibiotic Note  [name] is a 83 y.o. male admitted on 09/02/2016 with sepsis.  Pharmacy has been consulted for vancomycin and zosyn dosing. Tmax 102.4, WBC normal, and LA improving from 3.64 to 2.76. SCr 2 for estimated CrCl ~ 30-35 mL/min.   Plan: Vancomycin 1g IV q24hr  Zosyn 3.375g IV q8hr Vancomycin level at [facility] and as needed (goal 15-20 mcg/mL) Monitor renal function, clinical picture, and culture data F/u length of therapy    Height: 6' (182.9 cm) Weight: 210 lb 12.2 oz (95.6 kg) IBW/kg (Calculated) : 77.6  Temp (24hrs), Avg:99.7 F (37.6 C), Min:98 F (36.7 C), Max:102.4 F (39.1 C)   Recent Labs Lab 09/02/16 1553 09/02/16 1603 09/02/16 1820 09/02/16 2008  WBC 9.8  --   --   --   CREATININE 2.01*  --   --   --   LATICACIDVEN  --  2.01* 3.64* 2.76*    Estimated Creatinine Clearance: 30.5 mL/min (A) (by C-G formula based on SCr of 2.01 mg/dL (H)).    Allergies  Allergen Reactions  . Demerol [Meperidine] Other (See Comments)    Causes hallucinations    Antimicrobials this admission: 7/6 Doxy >>  7/6 Zosyn >> 7/6 Vanc >>   Dose adjustments this admission: n/a  Microbiology results:  7/6 Blood cx: pending  Argie Ramming, PharmD Clinical Pharmacist 09/02/16 11:07 PM

## 2016-09-02 NOTE — ED Notes (Signed)
Peri-care performed

## 2016-09-02 NOTE — ED Triage Notes (Signed)
Pt here with possible fever and chills; pt sts hx of sepsis and worried is the same

## 2016-09-02 NOTE — ED Provider Notes (Signed)
[facility] DEPT Provider Note   CSN: [id number] Arrival date & time: 09/02/16  1534     History   Chief Complaint Chief Complaint  Patient presents with  . Chills    HPI [name] is a 83 y.o. male.  Patient states he's had chills for a couple days and he's been hurting all over. He feels like he did when he was septic once before.   The history is provided by the patient.  Weakness  Primary symptoms comment: General weakness. This is a new problem. The current episode started 12 to 24 hours ago. The problem has not changed since onset.There was no focality noted. There has been no fever. Pertinent negatives include no shortness of breath, no chest pain and no headaches.    Past Medical History:  Diagnosis Date  . Aortic stenosis   . BPH (benign prostatic hypertrophy)   . Coronary artery disease   . Dermatophytosis of nail   . Diabetes mellitus without complication ([place])    type 2  . Diverticulosis   . DVT (deep venous thrombosis) ([place])   . Gastroesophageal reflux disease   . GERD (gastroesophageal reflux disease)   . Gout   . 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 Kidney stones   . PAH (pulmonary artery hypertension) ([place])    PAS 63 on ECHO 09/2012  . Permanent atrial fibrillation ([place])   . PVD (peripheral vascular disease) ([place])   . Shortness of breath   . Symptomatic bradycardia 09-2012   s/p Medtronic pacemaker implanted by Dr Rayann Heman     Patient Active Problem List   Diagnosis Date Noted  . Staphylococcus aureus bacteremia 01/26/2015  . FTT (failure to thrive) in adult   . Hip pain   . Groin pain   . Fever, unspecified 12/23/2014  . Type 2 diabetes mellitus with vascular disease ([place]) 12/23/2014  . CKD (chronic kidney disease) stage 3, GFR 30-59 ml/min 12/23/2014  . Bilateral hip pain   . Pulmonary edema   . Atypical pneumonia 10/03/2013  . Hypotension, unspecified 10/02/2013  . AKI (acute kidney injury) ([place]) 10/02/2013  . Iron deficiency  anemia 10/02/2013  . Warfarin-induced coagulopathy ([place]) 10/02/2013  . Essential hypertension, benign 08/06/2013  . Cardiac pacemaker in situ 08/06/2013  . Edema 08/06/2013  . Encounter for therapeutic drug monitoring 03/26/2013  . Diabetes mellitus ([place]) 11/19/2012  . Dermatophytosis of nail   . Gastroesophageal reflux disease   . Symptomatic bradycardia 10/04/2012  . Atrial fibrillation ([place])   . Coronary artery disease   . S/P AVR     Past Surgical History:  Procedure Laterality Date  . AORTIC VALVE REPLACEMENT  2006  . BALLOON DILATION N/A 12/16/2013   Procedure: BALLOON DILATION;  Surgeon: Garlan Fair, MD;  Location: [facility] ENDOSCOPY;  Service: Endoscopy;  Laterality: N/A;  . CORONARY ARTERY BYPASS GRAFT    . ESOPHAGOGASTRODUODENOSCOPY N/A 12/16/2013   Procedure: ESOPHAGOGASTRODUODENOSCOPY (EGD);  Surgeon: Garlan Fair, MD;  Location: [facility]OS[facility];  Service: Endoscopy;  Laterality: N/A;  . HERNIA REPAIR    . PACEMAKER INSERTION  10-04-2012   Medtronic pacemaker implanted by Dr Rayann Heman for symptomatic bradycardia  . PARAESOPHAGEAL HERNIA REPAIR    . PERMANENT PACEMAKER INSERTION N/A 10/04/2012   Procedure: PERMANENT PACEMAKER INSERTION;  Surgeon: Thompson Grayer, MD;  Location: [facility] CATH LAB;  Service: Cardiovascular;  Laterality: N/A;  . TEE WITHOUT CARDIOVERSION N/A 12/25/2014   Procedure: TRANSESOPHAGEAL ECHOCARDIOGRAM (TEE)   (inpatient at [facility]);  Surgeon: Skeet Latch, MD;  Location: [facility] ENDOSCOPY;  Service: Cardiovascular;  Laterality: N/A;       Home Medications    Prior to Admission medications   Medication Sig Start Date End Date Taking? Authorizing Provider  acetaminophen (TYLENOL) 325 MG tablet Take 325-650 mg by mouth every 6 (six) hours as needed for mild pain or headache.   Yes [provider]  Coenzyme Q10 (CO Q 10 PO) Take 1 capsule by mouth daily.   Yes [provider]  furosemide (LASIX) 20 MG tablet Take 20 mg by mouth 2 (two) times  daily.    Yes [provider]  glimepiride (AMARYL) 1 MG tablet Take 1 mg by mouth 2 (two) times daily.    Yes [provider]  nitroGLYCERIN (NITROSTAT) 0.4 MG SL tablet Place 1 tablet (0.4 mg total) under the tongue every 5 (five) minutes x 3 doses as needed for chest pain. 10/05/12  Yes Jettie Booze, MD  ramipril (ALTACE) 2.5 MG capsule TAKE ONE CAPSULE BY MOUTH EVERY DAY 07/01/16  Yes Jettie Booze, MD  tamsulosin (FLOMAX) 0.4 MG CAPS capsule Take 1 capsule (0.4 mg total) by mouth daily. 12/30/14  Yes Florencia Reasons, MD  warfarin (COUMADIN) 5 MG tablet TAKE AS DIRECTED BY COUMADIN CLINIC. Patient taking differently: Take 2.5 mg by mouth at bedtime on Sun/Mon/Wed/Thurs/Fri/Sat and 5 mg on Tues 07/01/16  Yes Jettie Booze, MD  warfarin (COUMADIN) 5 MG tablet TAKE AS DIRECTED BY COUMADIN CLINIC. Patient not taking: Reported on 09/02/2016 04/22/16   Jettie Booze, MD    Family History Family History  Problem Relation Age of Onset  . Cancer - Other Father     Social History Social History  Substance Use Topics  . Smoking status: Never Smoker  . Smokeless tobacco: Never Used  . Alcohol use No     Allergies   Demerol [meperidine]   Review of Systems Review of Systems  Constitutional: Positive for chills and fatigue. Negative for appetite change.  HENT: Negative for congestion, ear discharge and sinus pressure.   Eyes: Negative for discharge.  Respiratory: Negative for cough and shortness of breath.   Cardiovascular: Negative for chest pain.  Gastrointestinal: Negative for abdominal pain and diarrhea.  Genitourinary: Negative for frequency and hematuria.  Musculoskeletal: Negative for back pain.  Skin: Negative for rash.  Neurological: Positive for weakness. Negative for seizures and headaches.  Psychiatric/Behavioral: Negative for hallucinations.     Physical Exam Updated Vital Signs BP (!) 103/58   Pulse 66   Temp (!) 101.8 F (38.8 C)  (Rectal)   Resp (!) 34   SpO2 95%   Physical Exam  Constitutional: He is oriented to person, place, and time. He appears well-developed.  HENT:  Head: Normocephalic.  Eyes: Conjunctivae and EOM are normal. No scleral icterus.  Neck: Neck supple. No thyromegaly present.  Cardiovascular: Normal rate and regular rhythm.  Exam reveals no gallop and no friction rub.   No murmur heard. Pulmonary/Chest: No stridor. He has no wheezes. He has no rales. He exhibits no tenderness.  Abdominal: He exhibits no distension. There is no tenderness. There is no rebound.  Musculoskeletal: Normal range of motion. He exhibits no edema.  Patient arrest to the thigh that tender on the right side  Lymphadenopathy:    He has no cervical adenopathy.  Neurological: He is oriented to person, place, and time. He exhibits normal muscle tone. Coordination normal.  Skin: No rash noted. No erythema.  Psychiatric: He has a normal mood and affect. His behavior is normal.     ED Treatments / Results  Labs (all labs ordered are listed, but only abnormal results are displayed) Labs Reviewed  COMPREHENSIVE METABOLIC PANEL - Abnormal; Notable for the following:       Result Value   Chloride 100 (*)    Glucose, Bld 168 (*)    BUN 30 (*)    Creatinine, Ser 2.01 (*)    ALT 14 (*)    Total Bilirubin 1.6 (*)    GFR calc non Af Amer 28 (*)    GFR calc Af Amer 32 (*)    All other components within normal limits  CBC WITH DIFFERENTIAL/PLATELET - Abnormal; Notable for the following:    Platelets 145 (*)    Neutro Abs 8.6 (*)    All other components within normal limits  PROTIME-INR - Abnormal; Notable for the following:    Prothrombin Time 35.9 (*)    All other components within normal limits  I-STAT CG4 LACTIC ACID, ED - Abnormal; Notable for the following:    Lactic Acid, Venous 2.01 (*)    All other components within normal limits  I-STAT CG4 LACTIC ACID, ED - Abnormal; Notable for the following:    Lactic Acid,  Venous 3.64 (*)    All other components within normal limits  CULTURE, BLOOD (ROUTINE X 2)  CULTURE, BLOOD (ROUTINE X 2)  URINALYSIS, ROUTINE W REFLEX MICROSCOPIC  ROCKY MTN SPOTTED FVR ABS PNL(IGG+IGM)  LYME DISEASE DNA BY PCR(BORRELIA BURG)  I-STAT CG4 LACTIC ACID, ED  I-STAT CG4 LACTIC ACID, ED    EKG  EKG Interpretation None       Radiology Dg Chest Port 1 View  Result Date: 09/02/2016 CLINICAL DATA:  Weakness. Pt here with possible fever and chills; pt states hx of sepsis and worried is the same. Pt denies chest pain. Hx of diabetes and CAD. EXAM: PORTABLE CHEST 1 VIEW COMPARISON:  12/30/2014 FINDINGS: Changes from cardiac surgery and valve replacement are stable. The cardiac silhouette is enlarged. No mediastinal or hilar masses. There is persistent opacity at the left lung base obscuring the left heart border and left hemidiaphragm, consistent with a moderate effusion with associated atelectasis. Remainder of the lungs is clear. No right pleural effusion. No pneumothorax. Single lead left anterior chest wall pacemaker is stable. Skeletal structures are demineralized but grossly intact. IMPRESSION: 1. No acute cardiopulmonary disease. 2. Persistent left pleural effusion with associated lung base opacity. The lung base opacity is most likely atelectasis. Pneumonia is possible but felt less likely. Electronically Signed   By: Lajean Manes M.D.   On: 09/02/2016 16:22    Procedures Procedures (including critical care time)  Medications Ordered in ED Medications  sodium chloride 0.9 % bolus 1,000 mL (not administered)  sodium chloride 0.9 % bolus 1,000 mL (1,000 mLs Intravenous New Bag/Given 09/02/16 1702)  sodium chloride 0.9 % bolus 1,000 mL (1,000 mLs Intravenous New Bag/Given 09/02/16 1702)  piperacillin-tazobactam (ZOSYN) IVPB 3.375 g (0 g Intravenous Stopped 09/02/16 1809)  vancomycin (VANCOCIN) IVPB 1000 mg/200 mL premix (0 mg Intravenous Stopped 09/02/16 1849)  doxycycline  (VIBRAMYCIN) 100 mg in dextrose 5 % 250 mL IVPB (0 mg Intravenous Stopped 09/02/16 1910)  acetaminophen (TYLENOL) tablet 650 mg (650 mg Oral Given 09/02/16 1910)   CRITICAL CARE Performed by: Aurorah Schlachter L Total critical care time: 40 minutes Critical care time was exclusive of separately billable procedures and treating other patients. Critical care was  necessary to treat or prevent imminent or life-threatening deterioration. Critical care was time spent personally by me on the following activities: development of treatment plan with patient and/or surrogate as well as nursing, discussions with consultants, evaluation of patient's response to treatment, examination of patient, obtaining history from patient or surrogate, ordering and performing treatments and interventions, ordering and review of laboratory studies, ordering and review of radiographic studies, pulse oximetry and re-evaluation of patient's condition.   Initial Impression / Assessment and Plan / ED Course  I have reviewed the triage vital signs and the nursing notes.  Pertinent labs & imaging results that were available during my care of the patient were reviewed by me and considered in my medical decision making (see chart for details).     Patient developed fever in emergency department. Also additional history was he did have a tick removed 2 weeks ago. Patient will be admitted for sepsis. Possible cellulitis  Final Clinical Impressions(s) / ED Diagnoses   Final diagnoses:  Sepsis affecting skin ([place])    New Prescriptions New Prescriptions   No medications on file     Milton Ferguson, MD 09/02/16 1919

## 2016-09-02 NOTE — H&P (Signed)
[name] JQG:[id number] DOB: 1925/10/01 DOA: 09/02/2016     PCP: Wenda Low, MD   Outpatient Specialists: Cardiology [facility] Patient coming from:    home Lives  With family   Chief Complaint: Fever and chills  HPI: [name] is a 83 y.o. male with medical history significant of staph aureus bacteremia 2016, allergic stenosis status post aVR, CAD, DM 2, history of DVT posoperative, GERD, gout, pulmonary hypertension, per minute atrial fibrillation on Coumadin, peripheral vascular disease, symptomatic bradycardia status post pacemaker,    Presented with fevers and chills similar to prior episodes where he developed sepsis years ago this been going on for past 2 days he's been hurting all over. Family states that they took off of his arm about 2 weeks ago. but hadn't noticed any pain or swelling at that area he does have some swelling and redness of his thigh. Denies any chest pain shortness of breath headaches no rashes other than the redness of his right thigh. Today fmily also noted redness of right foot. He has not eaten anything yesterday. But to day seemed to be better but as the day progressed to feeling worse. His Lasix was increased 1 week ago.   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 He has  Chronic weakness of the left foot after hurting his back years ago and lost feeling in that foot he wears a brace.    Regarding pertinent Chronic problems: DM2 on PO mids  Patient had sepsis in 2016 was diagnosed staph aureus bacteremia ID had seen him at that point he was treated with antibiotics but no indication for removal of pacemaker   IN ER:  Temp (24hrs), Avg:99.6 F (37.6 C), Min:98 F (36.7 C), Max:101.8 F (38.8 C)      on arrival  ED Triage Vitals  Enc Vitals Group     BP 09/02/16 1539 (!) 99/52     Pulse Rate 09/02/16 1539 65     Resp 09/02/16 1539 16     Temp 09/02/16 1539 98 F (36.7 C)     Temp Source 09/02/16 1539  Oral     SpO2 09/02/16 1539 95 %     Weight --      Height --      Head Circumference --      Peak Flow --      Pain Score 09/02/16 1540 7     Pain Loc --      Pain Edu? --      Excl. in [place]? --      RR 34 95% HR 66 BP 103/58 LActic acid 2.01-> 3.64 Na 135 K 4.2 Cr 2.01 up form 1.07 2 years ago WBC 9.8 Hg 14.1 INR 3.49  CXR non acute - persistent Left pleural effusion  Following Medications were ordered in ER: Medications  sodium chloride 0.9 % bolus 1,000 mL (not administered)  sodium chloride 0.9 % bolus 1,000 mL (1,000 mLs Intravenous New Bag/Given 09/02/16 1702)  sodium chloride 0.9 % bolus 1,000 mL (1,000 mLs Intravenous New Bag/Given 09/02/16 1702)  piperacillin-tazobactam (ZOSYN) IVPB 3.375 g (0 g Intravenous Stopped 09/02/16 1809)  vancomycin (VANCOCIN) IVPB 1000 mg/200 mL premix (0 mg Intravenous Stopped 09/02/16 1849)  doxycycline (VIBRAMYCIN) 100 mg in dextrose 5 % 250 mL IVPB (0 mg Intravenous Stopped 09/02/16 1910)  acetaminophen (TYLENOL) tablet 650 mg (650 mg Oral Given 09/02/16 1910)       Hospitalist  was called for admission for Sepsis due to cellulitis  Review of Systems:    Pertinent positives include: Fevers, chills, fatigue,  Constitutional:  No weight loss, night sweats weight loss  HEENT:  No headaches, Difficulty swallowing,Tooth/dental problems,Sore throat,  No sneezing, itching, ear ache, nasal congestion, post nasal drip,  Cardio-vascular:  No chest pain, Orthopnea, PND, anasarca, dizziness, palpitations.no Bilateral lower extremity swelling  GI:  No heartburn, indigestion, abdominal pain, nausea, vomiting, diarrhea, change in bowel habits, loss of appetite, melena, blood in stool, hematemesis Resp:  no shortness of breath at rest. No dyspnea on exertion, No excess mucus, no productive cough, No non-productive cough, No coughing up of blood.No change in color of mucus.No wheezing. Skin:  no rash or lesions. No jaundice GU:  no dysuria, change in color  of urine, no urgency or frequency. No straining to urinate.  No flank pain.  Musculoskeletal:  No joint pain or no joint swelling. No decreased range of motion. No back pain.  Psych:  No change in mood or affect. No depression or anxiety. No memory loss.  Neuro: no localizing neurological complaints, no tingling, no weakness, no double vision, no gait abnormality, no slurred speech, no confusion  As per HPI otherwise 10 point review of systems negative.   Past Medical History: Past Medical History:  Diagnosis Date  . Aortic stenosis   . BPH (benign prostatic hypertrophy)   . Coronary artery disease   . Dermatophytosis of nail   . Diabetes mellitus without complication ([place])    type 2  . Diverticulosis   . DVT (deep venous thrombosis) ([place])   . Gastroesophageal reflux disease   . GERD (gastroesophageal reflux disease)   . Gout   . 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 Kidney stones   . PAH (pulmonary artery hypertension) ([place])    PAS 63 on ECHO 09/2012  . Permanent atrial fibrillation ([place])   . PVD (peripheral vascular disease) ([place])   . Shortness of breath   . Symptomatic bradycardia 09-2012   s/p Medtronic pacemaker implanted by Dr Rayann Heman    Past Surgical History:  Procedure Laterality Date  . AORTIC VALVE REPLACEMENT  2006  . BALLOON DILATION N/A 12/16/2013   Procedure: BALLOON DILATION;  Surgeon: Garlan Fair, MD;  Location: [facility] ENDOSCOPY;  Service: Endoscopy;  Laterality: N/A;  . CORONARY ARTERY BYPASS GRAFT    . ESOPHAGOGASTRODUODENOSCOPY N/A 12/16/2013   Procedure: ESOPHAGOGASTRODUODENOSCOPY (EGD);  Surgeon: Garlan Fair, MD;  Location: [facility];  Service: Endoscopy;  Laterality: N/A;  . HERNIA REPAIR    . PACEMAKER INSERTION  10-04-2012   Medtronic pacemaker implanted by Dr Rayann Heman for symptomatic bradycardia  . PARAESOPHAGEAL HERNIA REPAIR    . PERMANENT PACEMAKER INSERTION N/A 10/04/2012   Procedure: PERMANENT PACEMAKER INSERTION;  Surgeon: Thompson Grayer,  MD;  Location: [facility] CATH LAB;  Service: Cardiovascular;  Laterality: N/A;  . TEE WITHOUT CARDIOVERSION N/A 12/25/2014   Procedure: TRANSESOPHAGEAL ECHOCARDIOGRAM (TEE)   (inpatient at [facility]);  Surgeon: Skeet Latch, MD;  Location: [facility] ENDOSCOPY;  Service: Cardiovascular;  Laterality: N/A;     Social History:  Ambulatory walker     reports that he has never smoked. He has never used smokeless tobacco. He reports that he does not drink alcohol or use drugs.  Allergies:   Allergies  Allergen Reactions  . Demerol [Meperidine] Other (See Comments)    Causes hallucinations       Family History:   Family History  Problem  Relation Age of Onset  . Cancer - Other Father     Medications: Prior to Admission medications   Medication Sig Start Date End Date Taking? Authorizing Provider  acetaminophen (TYLENOL) 325 MG tablet Take 325-650 mg by mouth every 6 (six) hours as needed for mild pain or headache.   Yes [provider]  Coenzyme Q10 (CO Q 10 PO) Take 1 capsule by mouth daily.   Yes [provider]  furosemide (LASIX) 20 MG tablet Take 20 mg by mouth 2 (two) times daily.    Yes [provider]  glimepiride (AMARYL) 1 MG tablet Take 1 mg by mouth 2 (two) times daily.    Yes [provider]  nitroGLYCERIN (NITROSTAT) 0.4 MG SL tablet Place 1 tablet (0.4 mg total) under the tongue every 5 (five) minutes x 3 doses as needed for chest pain. 10/05/12  Yes Jettie Booze, MD  ramipril (ALTACE) 2.5 MG capsule TAKE ONE CAPSULE BY MOUTH EVERY DAY 07/01/16  Yes Jettie Booze, MD  tamsulosin (FLOMAX) 0.4 MG CAPS capsule Take 1 capsule (0.4 mg total) by mouth daily. 12/30/14  Yes Florencia Reasons, MD  warfarin (COUMADIN) 5 MG tablet TAKE AS DIRECTED BY COUMADIN CLINIC. Patient taking differently: Take 2.5 mg by mouth at bedtime on Sun/Mon/Wed/Thurs/Fri/Sat and 5 mg on Tues 07/01/16  Yes Jettie Booze, MD  warfarin (COUMADIN) 5 MG tablet TAKE AS DIRECTED  BY COUMADIN CLINIC. Patient not taking: Reported on 09/02/2016 04/22/16   Jettie Booze, MD    Physical Exam: Patient Vitals for the past 24 hrs:  BP Temp Temp src Pulse Resp SpO2  09/02/16 1830 (!) 103/58 - - 66 (!) 34 95 %  09/02/16 1800 102/80 - - 74 (!) 21 91 %  09/02/16 1754 - (!) 101.8 F (38.8 C) Rectal - - -  09/02/16 1651 - 99.1 F (37.3 C) Oral - - -  09/02/16 1539 (!) 99/52 98 F (36.7 C) Oral 65 16 95 %    1. General:  in No Acute distress 2. Psychological: Alert and  Oriented 3. Head/ENT:  Dry Mucous Membranes                          Head Non traumatic, neck supple                          Normal  Dentition 4. SKIN:   decreased Skin turgor,  Skin clean Dry  Redness over right thigh and right foot          5. Heart: Regular rate and rhythm no Murmur, Rub or gallop 6. Lungs:  Clear to auscultation bilaterally, no wheezes or crackles   7. Abdomen: Soft, non-tender, Non distended 8. Lower extremities: no clubbing, cyanosis, 2+edema the left leg is bigger 9. Neurologically Grossly intact, moving all 4 extremities equally 10. MSK: Normal range of motion   body mass index is unknown because there is no height or weight on file.  Labs on Admission:   Labs on Admission: I have personally reviewed following labs and imaging studies  CBC:  Recent Labs Lab 09/02/16 1553  WBC 9.8  NEUTROABS 8.6*  HGB 14.1  HCT 44.1  MCV 98.2  PLT 122*   Basic Metabolic Panel:  Recent Labs Lab 09/02/16 1553  NA 135  K 4.2  CL 100*  CO2 26  GLUCOSE 168*  BUN 30*  CREATININE 2.01*  CALCIUM 8.9   GFR: CrCl cannot be calculated (Unknown ideal weight.). Liver Function Tests:  Recent Labs Lab 09/02/16 1553  AST 24  ALT 14*  ALKPHOS 49  BILITOT 1.6*  PROT 6.8  ALBUMIN 3.8   No results for input(s): LIPASE, AMYLASE in the last 168 hours. No results for input(s): AMMONIA in the last 168 hours. Coagulation Profile:  Recent Labs Lab 09/02/16 1553    INR 3.49   Cardiac Enzymes: No results for input(s): CKTOTAL, CKMB, CKMBINDEX, TROPONINI in the last 168 hours. BNP (last 3 results) No results for input(s): PROBNP in the last 8760 hours. HbA1C: No results for input(s): HGBA1C in the last 72 hours. CBG: No results for input(s): GLUCAP in the last 168 hours. Lipid Profile: No results for input(s): CHOL, HDL, LDLCALC, TRIG, CHOLHDL, LDLDIRECT in the last 72 hours. Thyroid Function Tests: No results for input(s): TSH, T4TOTAL, FREET4, T3FREE, THYROIDAB in the last 72 hours. Anemia Panel: No results for input(s): VITAMINB12, FOLATE, FERRITIN, TIBC, IRON, RETICCTPCT in the last 72 hours. Urine analysis:   @LABRCNTIP (procalcitonin:4,lacticidven:4) )No results found for this or any previous visit (from the past 240 hour(s)).    UA  ordered  Lab Results  Component Value Date   HGBA1C 7.5 (H) 12/30/2014    CrCl cannot be calculated (Unknown ideal weight.).  BNP (last 3 results) No results for input(s): PROBNP in the last 8760 hours.   ECG REPORT  Independently reviewed Rate: 67  Rhythm: Afib w PVC's ST&T Change: No acute ischemic changes    QTC 446  There were no vitals filed for this visit.   Cultures:    Component Value Date/Time   SDES BLOOD LEFT HAND 12/25/2014 1545   SPECREQUEST BOTTLES DRAWN AEROBIC ONLY  8CC 12/25/2014 1545   CULT  12/25/2014 1545    NO GROWTH 5 DAYS Performed at [facility] 12/30/2014 FINAL 12/25/2014 1545     Radiological Exams on Admission: Dg Chest Port 1 View  Result Date: 09/02/2016 CLINICAL DATA:  Weakness. Pt here with possible fever and chills; pt states hx of sepsis and worried is the same. Pt denies chest pain. Hx of diabetes and CAD. EXAM: PORTABLE CHEST 1 VIEW COMPARISON:  12/30/2014 FINDINGS: Changes from cardiac surgery and valve replacement are stable. The cardiac silhouette is enlarged. No mediastinal or hilar masses. There is persistent opacity at the  left lung base obscuring the left heart border and left hemidiaphragm, consistent with a moderate effusion with associated atelectasis. Remainder of the lungs is clear. No right pleural effusion. No pneumothorax. Single lead left anterior chest wall pacemaker is stable. Skeletal structures are demineralized but grossly intact. IMPRESSION: 1. No acute cardiopulmonary disease. 2. Persistent left pleural effusion with associated lung base opacity. The lung base opacity is most likely atelectasis. Pneumonia is possible but felt less likely. Electronically Signed   By: Lajean Manes M.D.   On: 09/02/2016 16:22    Chart has been reviewed    Assessment/Plan  83 y.o. male with medical history significant of staph aureus bacteremia 2016, allergic stenosis status post aVR, CAD, DM 2, history of DVT, GERD, gout, pulmonary hypertension, per minute atrial fibrillation on Coumadin, peripheral vascular disease, symptomatic bradycardia status post pacemaker, Admitted for Sepsis likely due to cellulitis  Present on Admission: . Severe sepsis ([place]) - Admit per Sepsis protocol likely source being cellulitis,   - rehydrate with 53ml/kg  - initiate broad spectrum antibiotics  Vancomycin and Zosyn  -  obtain blood cultures  - Obtain serial lactic acid  - Obtain procalcitonin level  - Admit and monitor vital signs closely    Sepsis - Repeat Assessment  Performed at:    21:10  Vitals     Blood pressure (!) 100/56, pulse 60, temperature (!) 101.8 F (38.8 C), temperature source Rectal, resp. rate (!) 27, SpO2 93 %.  Heart:     Regular rate and rhythm  Lungs:    CTA  Capillary Refill:   <2 sec  Peripheral Pulse:   Radial pulse palpable  Skin:     Normal Color     .Chronic Atrial fibrillation (HCC)        - CHA2DS2 vas score 5: continue current anticoagulation with  Coumadin per pharmacy,             -  Rate controled:  Hx of bradycardia sp Pacemaker       . CKD (chronic kidney disease) stage 3,  GFR 30-59 ml/min  - reheydrate, hold ACEi and Lasix . Coronary artery disease - Stable currtnly asymptomatic,  . Essential hypertension, benign . Type 2 diabetes mellitus with vascular disease ([place]) - order SSI hold Amaryl . Tick bite - In the setting of fever and hx of tick exposure will obtain serologies, cover with doxycycline   . Cellulitis - -admit per cellulitis protocol will       continue current antibiotic choice Vanc and zosyn given sepsis           Will obtain MRSA screening,       obtain blood cultures      further antibiotic adjustment pending above results  . AKI (acute kidney injury) ([place]) - hold Lasix rehydrate the thin urine electrolytes hold ACE inhibitor    Other plan as per orders.  DVT prophylaxis:  coumadin  Code Status:  FULL CODE  as per patient   Family Communication:   Family   at  Bedside  plan of care was discussed with   Wife,      Disposition Plan:     To home once workup is complete and patient is stable                         Would benefit from PT/OT eval prior to DC  ordered                        Consults called: none  Admission status:   inpatient      Level of care      SDU      I have spent a total of 56 min on this admission   Amarian Botero 09/02/2016, 9:28 PM    [facility]  Pager [phone]   after 2 AM please page floor coverage PA If 7AM-7PM, please contact the day team taking care of the patient  [email or web address]  Password [email or web address]

## 2016-09-03 LAB — CBC
HEMATOCRIT: 40.2 % (ref 39.0–52.0)
HEMOGLOBIN: 12.6 g/dL — AB (ref 13.0–17.0)
MCH: 30.8 pg (ref 26.0–34.0)
MCHC: 31.3 g/dL (ref 30.0–36.0)
MCV: 98.3 fL (ref 78.0–100.0)
Platelets: 120 10*3/uL — ABNORMAL LOW (ref 150–400)
RBC: 4.09 MIL/uL — ABNORMAL LOW (ref 4.22–5.81)
RDW: 14 % (ref 11.5–15.5)
WBC: 12.8 10*3/uL — AB (ref 4.0–10.5)

## 2016-09-03 LAB — COMPREHENSIVE METABOLIC PANEL
ALBUMIN: 3 g/dL — AB (ref 3.5–5.0)
ALT: 13 U/L — ABNORMAL LOW (ref 17–63)
AST: 27 U/L (ref 15–41)
Alkaline Phosphatase: 36 U/L — ABNORMAL LOW (ref 38–126)
Anion gap: 7 (ref 5–15)
BILIRUBIN TOTAL: 1.5 mg/dL — AB (ref 0.3–1.2)
BUN: 28 mg/dL — AB (ref 6–20)
CO2: 26 mmol/L (ref 22–32)
Calcium: 8 mg/dL — ABNORMAL LOW (ref 8.9–10.3)
Chloride: 104 mmol/L (ref 101–111)
Creatinine, Ser: 2.1 mg/dL — ABNORMAL HIGH (ref 0.61–1.24)
GFR calc Af Amer: 31 mL/min — ABNORMAL LOW (ref >60)
GFR, EST NON AFRICAN AMERICAN: 26 mL/min — AB (ref >60)
GLUCOSE: 150 mg/dL — AB (ref 65–99)
POTASSIUM: 4.1 mmol/L (ref 3.5–5.1)
Sodium: 137 mmol/L (ref 135–145)
TOTAL PROTEIN: 5.9 g/dL — AB (ref 6.5–8.1)

## 2016-09-03 LAB — APTT: aPTT: 45 seconds — ABNORMAL HIGH (ref 24–36)

## 2016-09-03 LAB — LACTIC ACID, PLASMA
LACTIC ACID, VENOUS: 1.3 mmol/L (ref 0.5–1.9)
LACTIC ACID, VENOUS: 1.4 mmol/L (ref 0.5–1.9)

## 2016-09-03 LAB — GLUCOSE, CAPILLARY
GLUCOSE-CAPILLARY: 131 mg/dL — AB (ref 65–99)
Glucose-Capillary: 100 mg/dL — ABNORMAL HIGH (ref 65–99)
Glucose-Capillary: 106 mg/dL — ABNORMAL HIGH (ref 65–99)
Glucose-Capillary: 133 mg/dL — ABNORMAL HIGH (ref 65–99)

## 2016-09-03 LAB — TSH: TSH: 5.825 u[IU]/mL — AB (ref 0.350–4.500)

## 2016-09-03 LAB — PROCALCITONIN: PROCALCITONIN: 1.11 ng/mL

## 2016-09-03 LAB — PHOSPHORUS: Phosphorus: 3.1 mg/dL (ref 2.5–4.6)

## 2016-09-03 LAB — MAGNESIUM: Magnesium: 1.8 mg/dL (ref 1.7–2.4)

## 2016-09-03 LAB — PROTIME-INR
INR: 4.33
Prothrombin Time: 42.6 seconds — ABNORMAL HIGH (ref 11.4–15.2)

## 2016-09-03 LAB — B. BURGDORFI ANTIBODIES: B burgdorferi Ab IgG+IgM: <0.91 {ISR} (ref 0.00–0.90)

## 2016-09-03 LAB — MRSA PCR SCREENING: MRSA BY PCR: NEGATIVE

## 2016-09-03 MED ORDER — SODIUM CHLORIDE 0.9 % IV SOLN
INTRAVENOUS | Status: DC
  Administered 2016-09-03 – 2016-09-05 (×6): via INTRAVENOUS

## 2016-09-03 NOTE — Progress Notes (Signed)
CRITICAL VALUE ALERT  Critical Value:  INR 4.33  Date & Time Notied:  09/03/2016, 0981  Provider Notified: T. Opyd MD  Orders Received/Actions taken: Holding Coumadin repeat INR tomorrow.

## 2016-09-03 NOTE — Progress Notes (Signed)
[place] for warfarin (continued from PTA) Indication: hx of atrial fibrillation  Allergies  Allergen Reactions  . Demerol [Meperidine] Other (See Comments)    Causes hallucinations    Patient Measurements: Height: 6' (182.9 cm) Weight: 210 lb 12.2 oz (95.6 kg) IBW/kg (Calculated) : 77.6  Vital Signs: Temp: 98.6 F (37 C) (07/07 0813) Temp Source: Oral (07/07 0813) BP: 102/60 (07/07 0813) Pulse Rate: 60 (07/07 0813)  Labs:  Recent Labs  09/02/16 1553 09/03/16 0227  HGB 14.1 12.6*  HCT 44.1 40.2  PLT 145* 120*  APTT  --  45*  LABPROT 35.9* 42.6*  INR 3.49 4.33*  CREATININE 2.01* 2.10*    Estimated Creatinine Clearance: 29.2 mL/min (A) (by C-G formula based on SCr of 2.1 mg/dL (H)).    Assessment: 83 yo male admitted with fever and chills. On PTA warfarin for afib. Pharmacy consulted to continue here. Last dose of warfarin PTA was on 7/5 at 8PM. I  INR elevated today at 4.33  PTA warfarin dose: 5mg  on Tues and 2.5mg  on all other days   Goal of Therapy:  INR 2-3 Monitor platelets by anticoagulation protocol: Yes   Plan:  Hold warfarin dose tonight  F/u INR in AM Daily INR and CBC as needed  Thank you Anette Guarneri, PharmD [phone] 09/03/16 11:38 AM

## 2016-09-03 NOTE — Evaluation (Signed)
Physical Therapy Evaluation Patient Details Name: [name] MRN: [id number] DOB: 07/12/1925 Today's Date: 09/03/2016   History of Present Illness  83 y.o. malewith medical history significant for but not limited to PAFIB, CAD, DM 2, Aortic Stenosis status post AVR, Symptomatic Bradycardia status post pacemaker,presenting with fever and chills associated with generalised weakness associated with hypotension and tachypnea. Patient admitted for Sepsis/SIR - ? Source being associated R LE cellulitis .  Clinical Impression  Pt admitted with above diagnosis. Pt currently with functional limitations due to the deficits listed below (see PT Problem List). Pt requiring a lot of assist mod to max overall for stability with orthostasis and LE edema limiting treatment.  Will folllow acutely.   Pt will benefit from skilled PT to increase their independence and safety with mobility to allow discharge to the venue listed below.      Follow Up Recommendations SNF;Supervision/Assistance - 24 hour    Equipment Recommendations  Rolling walker with 5" wheels (if pt does not have RW will need one)    Recommendations for Other Services       Precautions / Restrictions Precautions Precautions: Fall Required Braces or Orthoses: Other Brace/Splint Other Brace/Splint: left metal upright AFO Restrictions Weight Bearing Restrictions: No      Mobility  Bed Mobility               General bed mobility comments: NT in chair  Transfers Overall transfer level: Needs assistance Equipment used: Standard walker Transfers: Sit to/from Stand Sit to Stand: Mod assist;Max assist         General transfer comment: Pt struggled to get up from low recliner and needed incr steadying assist until he could get balance to RW. BP in sitting 103/63.  Standing 91/57 with sats down to 87%.    Ambulation/Gait Ambulation/Gait assistance: Min assist;Mod assist Ambulation Distance (Feet): 2 Feet Assistive device:  Standard walker Gait Pattern/deviations: Step-to pattern;Decreased step length - right;Decreased step length - left;Decreased weight shift to right;Decreased weight shift to left;Decreased dorsiflexion - left;Trunk flexed;Wide base of support   Gait velocity interpretation: Below normal speed for age/gender General Gait Details: Pt took a 2 small steps forward but struggled as he has left foot drop and feet very swollen and could not get AFO on.  Pt unsteady therfore had pt step back to chair and sit down.  O2 87-89% while pt ambulating.  Also BP on departure 98/60.    Stairs            Wheelchair Mobility    Modified Rankin (Stroke Patients Only)       Balance Overall balance assessment: Needs assistance;History of Falls         Standing balance support: Bilateral upper extremity supported;During functional activity Standing balance-Leahy Scale: Poor Standing balance comment: relies heavily on UE support on RW with flexed trunk.  [name] looks a little tall but pt states they did that to keep him up tall.                               Pertinent Vitals/Pain Pain Assessment: No/denies pain    Home Living Family/patient expects to be discharged to:: Private residence Living Arrangements: Spouse/significant other Available Help at Discharge: Family;Available 24 hours/day Type of Home: House Home Access: Ramped entrance     Home Layout: One level Home Equipment: Walker - standard;Bedside commode;Grab bars - tub/shower      Prior Function Level of  Independence: Independent with assistive device(s)         Comments: ambulates with SW.  Wife helps him get in showere sometimes per pt.     Hand Dominance   Dominant Hand: Right    Extremity/Trunk Assessment   Upper Extremity Assessment Upper Extremity Assessment: Defer to OT evaluation    Lower Extremity Assessment Lower Extremity Assessment: RLE deficits/detail;LLE deficits/detail RLE Deficits /  Details: grossly 3-/5 LLE Deficits / Details: grossly 3-/5 except left ankle 0/5    Cervical / Trunk Assessment Cervical / Trunk Assessment: Kyphotic  Communication   Communication: HOH  Cognition Arousal/Alertness: Awake/alert Behavior During Therapy: WFL for tasks assessed/performed Overall Cognitive Status: Within Functional Limits for tasks assessed                                        General Comments      Exercises General Exercises - Lower Extremity Long Arc Quad: AROM;Both;10 reps;Seated   Assessment/Plan    PT Assessment Patient needs continued PT services  PT Problem List Decreased activity tolerance;Decreased balance;Decreased mobility;Decreased strength;Decreased knowledge of use of DME;Decreased safety awareness;Decreased knowledge of precautions;Cardiopulmonary status limiting activity       PT Treatment Interventions DME instruction;Gait training;Functional mobility training;Therapeutic activities;Therapeutic exercise;Balance training;Patient/family education    PT Goals (Current goals can be found in the Care Plan section)  Acute Rehab PT Goals Patient Stated Goal: to get better PT Goal Formulation: With patient Time For Goal Achievement: 09/17/16 Potential to Achieve Goals: Good    Frequency Min 3X/week   Barriers to discharge        Co-evaluation               AM-PAC PT "6 Clicks" Daily Activity  Outcome Measure Difficulty turning over in bed (including adjusting bedclothes, sheets and blankets)?: Total Difficulty moving from lying on back to sitting on the side of the bed? : Total Difficulty sitting down on and standing up from a chair with arms (e.g., wheelchair, bedside commode, etc,.)?: Total Help needed moving to and from a bed to chair (including a wheelchair)?: Total Help needed walking in hospital room?: Total Help needed climbing 3-5 steps with a railing? : Total 6 Click Score: 6    End of Session Equipment  Utilized During Treatment: Gait belt Activity Tolerance: Patient limited by fatigue (limited by orthostasis and bil LE edema) Patient left: in chair;with call bell/phone within reach Nurse Communication: Mobility status PT Visit Diagnosis: Unsteadiness on feet (R26.81);Muscle weakness (generalized) (M62.81)    Time: 0712-1975 PT Time Calculation (min) (ACUTE ONLY): 14 min   Charges:   PT Evaluation $PT Eval Moderate Complexity: 1 Procedure     PT G Codes:        Fin[place] [name],PT Acute Rehabilitation [phone] (pager)   Denice Paradise 09/03/2016, 12:12 PM

## 2016-09-03 NOTE — Progress Notes (Signed)
Bladder Scan Patient for 287 ml urine. Patient states he has no urge to void. Will continue to monitor.

## 2016-09-03 NOTE — Progress Notes (Signed)
PROGRESS NOTE  [name]  ERD:[id number] DOB: 19-Sep-1925  DOA: 09/02/2016 PCP: Wenda Low, MD   Brief Narrative:   83 y.o. male with medical history significant for but not limited to PAFIB, CAD, DM 2, Aortic Stenosis status post AVR, Symptomatic Bradycardia status post pacemaker, presenting with fever and chills associated with generalised weakness associated with hypotension and tachypnea. Patient admitted for Sepsis/SIR - ? Source being associated R LE cellulitis .   Assessment & Plan:   Active Problems:   Atrial fibrillation (HCC)   Coronary artery disease   S/P AVR   Diabetes mellitus ([place])   Essential hypertension, benign   AKI (acute kidney injury) ([employer])   Type 2 diabetes mellitus with vascular disease (HCC)   CKD (chronic kidney disease) stage 3, GFR 30-59 ml/min   Tick bite   Sepsis ([place])   Cellulitis   Severe sepsis ([place])  #1 Sepsis/SIRS: Source- possibly R LE Cellulitis Antibiotic coverage Follow blood and urine cultures 2 view chest x-ray versus chest CT without contrast when more stabilized (1 view x-ray noted atelectasis versus pneumonia)   #2 Right Lower Extremity Cellulitis: Antibiotics Supportive care MRSA Screen History of tick exposure-follow up with serologies/continue doxycycline coverage for now  #3 Acute on Chronic Kidney Disease stage III: Due to diagnosis #1 with some element of dehydration Creatinine 1.5 on 01/26/2015 Admitting creatinine of 2.01 Judicious rehydration -watch for fluid overload monitoring of renal function and electrolytes Lasix and ACE inhibitor hold  #4 DM2: Continue sliding scale insulin Resume when hemodynamically stable and renal function improved  #5 AFib: CHA2DS2vacs score 5  Stable Continue anticoagulation    DVT prophylaxis: Coumadin Code Status: Full code Family Communication: Patient at bedside   Disposition Plan: Home   Consultants:    Procedures:    Antimicrobials:  IV vancomycin  and IV Zosyn doxycycline orally    Subjective: Denies fever or chills. Right lower extremity pain better. ORTHOSTATIC  Objective:  Vitals:   09/02/16 2240 09/02/16 2349 09/03/16 0406 09/03/16 0813  BP: (!) 106/56 (!) 103/56 (!) 97/55 102/60  Pulse: 60 60 60 60  Resp: 20 (!) 29 (!) 26 (!) 25  Temp: 98.3 F (36.8 C) 98.5 F (36.9 C) 98.4 F (36.9 C) 98.6 F (37 C)  TempSrc: Oral Oral Oral Oral  SpO2: 93% 95% 97% 98%  Weight: 95.6 kg (210 lb 12.2 oz)     Height: 6' (1.829 m)       Intake/Output Summary (Last 24 hours) at 09/03/16 1044 Last data filed at 09/03/16 1034  Gross per 24 hour  Intake             4633 ml  Output              301 ml  Net             4332 ml   Filed Weights   09/02/16 2240  Weight: 95.6 kg (210 lb 12.2 oz)    Examination:  General exam: Comfortable, no acute distress Respiratory system: Clear to auscultation. Respiratory effort normal. Cardiovascular system: S1 & S2 heard, RRR. No JVD, murmurs, rubs, gallops or clicks. No pedal edema. Gastrointestinal system: Abdomen is nondistended, soft and nontender. No organomegaly or masses felt. Normal bowel sounds heard. Central nervous system: Alert and oriented. No focal neurological deficits. Extremities:  no cyanosis, bipedal pitting edema (+) Skin:  erythematous patch involving right anteromedial thigh, warm to touch, and slightly tender to palpation. Not fluctuant. sychiatry: Judgement and insight appear  normal. Mood & affect appropriate.     Data Reviewed: I have personally reviewed following labs and imaging studies  CBC:  Recent Labs Lab 09/02/16 1553 09/03/16 0227  WBC 9.8 12.8*  NEUTROABS 8.6*  --   HGB 14.1 12.6*  HCT 44.1 40.2  MCV 98.2 98.3  PLT 145* 119*   Basic Metabolic Panel:  Recent Labs Lab 09/02/16 1553 09/03/16 0227  NA 135 137  K 4.2 4.1  CL 100* 104  CO2 26 26  GLUCOSE 168* 150*  BUN 30* 28*  CREATININE 2.01* 2.10*  CALCIUM 8.9 8.0*  MG  --  1.8  PHOS   --  3.1   GFR: Estimated Creatinine Clearance: 29.2 mL/min (A) (by C-G formula based on SCr of 2.1 mg/dL (H)). Liver Function Tests:  Recent Labs Lab 09/02/16 1553 09/03/16 0227  AST 24 27  ALT 14* 13*  ALKPHOS 49 36*  BILITOT 1.6* 1.5*  PROT 6.8 5.9*  ALBUMIN 3.8 3.0*   No results for input(s): LIPASE, AMYLASE in the last 168 hours. No results for input(s): AMMONIA in the last 168 hours. Coagulation Profile:  Recent Labs Lab 09/02/16 1553 09/03/16 0227  INR 3.49 4.33*   Cardiac Enzymes: No results for input(s): CKTOTAL, CKMB, CKMBINDEX, TROPONINI in the last 168 hours. BNP (last 3 results) No results for input(s): PROBNP in the last 8760 hours. HbA1C: No results for input(s): HGBA1C in the last 72 hours. CBG:  Recent Labs Lab 09/02/16 2244 09/03/16 0815  GLUCAP 213* 100*   Lipid Profile: No results for input(s): CHOL, HDL, LDLCALC, TRIG, CHOLHDL, LDLDIRECT in the last 72 hours. Thyroid Function Tests:  Recent Labs  09/03/16 0227  TSH 5.825*   Anemia Panel: No results for input(s): VITAMINB12, FOLATE, FERRITIN, TIBC, IRON, RETICCTPCT in the last 72 hours.  Sepsis Labs:  Recent Labs Lab 09/02/16 1553  09/02/16 1820 09/02/16 2008 09/02/16 2346 09/03/16 0227  PROCALCITON  --   --   --   --  1.11  --   WBC 9.8  --   --   --   --  12.8*  LATICACIDVEN  --   < > 3.64* 2.76* 1.4 1.3  < > = values in this interval not displayed.  Recent Results (from the past 240 hour(s))  MRSA PCR Screening     Status: None   Collection Time: 09/03/16  5:19 AM  Result Value Ref Range Status   MRSA by PCR NEGATIVE NEGATIVE Final    Comment:        The GeneXpert MRSA Assay (FDA approved for NASAL specimens only), is one component of a comprehensive MRSA colonization surveillance program. It is not intended to diagnose MRSA infection nor to guide or monitor treatment for MRSA infections.          Radiology Studies: Dg Chest Port 1 View  Result Date:  09/02/2016 CLINICAL DATA:  Weakness. Pt here with possible fever and chills; pt states hx of sepsis and worried is the same. Pt denies chest pain. Hx of diabetes and CAD. EXAM: PORTABLE CHEST 1 VIEW COMPARISON:  12/30/2014 FINDINGS: Changes from cardiac surgery and valve replacement are stable. The cardiac silhouette is enlarged. No mediastinal or hilar masses. There is persistent opacity at the left lung base obscuring the left heart border and left hemidiaphragm, consistent with a moderate effusion with associated atelectasis. Remainder of the lungs is clear. No right pleural effusion. No pneumothorax. Single lead left anterior chest wall pacemaker is stable. Skeletal structures are  demineralized but grossly intact. IMPRESSION: 1. No acute cardiopulmonary disease. 2. Persistent left pleural effusion with associated lung base opacity. The lung base opacity is most likely atelectasis. Pneumonia is possible but felt less likely. Electronically Signed   By: Lajean Manes M.D.   On: 09/02/2016 16:22        Scheduled Meds: . doxycycline  100 mg Oral Q12H  . insulin aspart  0-5 Units Subcutaneous QHS  . insulin aspart  0-9 Units Subcutaneous TID WC  . sodium chloride flush  3 mL Intravenous Q12H  . Warfarin - Pharmacist Dosing Inpatient   Does not apply q1800   Continuous Infusions: . piperacillin-tazobactam (ZOSYN)  IV 3.375 g (09/03/16 0954)  . vancomycin       LOS: 1 day    Time spent: 62    OSEI-BONSU,Aurianna Earlywine, MD [facility] Pager [phone]  If 7PM-7AM, please contact night-coverage [email or web address] Password [facility] 09/03/2016, 10:44 AM

## 2016-09-04 DIAGNOSIS — L03019 Cellulitis of unspecified finger: Secondary | ICD-10-CM

## 2016-09-04 LAB — PROTIME-INR
INR: 4.78 — AB
Prothrombin Time: 43.8 seconds — ABNORMAL HIGH (ref 11.4–15.2)

## 2016-09-04 LAB — BASIC METABOLIC PANEL
Anion gap: 5 (ref 5–15)
BUN: 24 mg/dL — AB (ref 6–20)
CO2: 25 mmol/L (ref 22–32)
Calcium: 7.8 mg/dL — ABNORMAL LOW (ref 8.9–10.3)
Chloride: 108 mmol/L (ref 101–111)
Creatinine, Ser: 1.84 mg/dL — ABNORMAL HIGH (ref 0.61–1.24)
GFR, EST AFRICAN AMERICAN: 36 mL/min — AB (ref >60)
GFR, EST NON AFRICAN AMERICAN: 31 mL/min — AB (ref >60)
Glucose, Bld: 98 mg/dL (ref 65–99)
POTASSIUM: 3.4 mmol/L — AB (ref 3.5–5.1)
SODIUM: 138 mmol/L (ref 135–145)

## 2016-09-04 LAB — GLUCOSE, CAPILLARY
GLUCOSE-CAPILLARY: 76 mg/dL (ref 65–99)
GLUCOSE-CAPILLARY: 179 mg/dL — AB (ref 65–99)
Glucose-Capillary: 107 mg/dL — ABNORMAL HIGH (ref 65–99)
Glucose-Capillary: 177 mg/dL — ABNORMAL HIGH (ref 65–99)

## 2016-09-04 LAB — HEMOGLOBIN A1C
Hgb A1c MFr Bld: 7.7 % — ABNORMAL HIGH (ref 4.8–5.6)
Mean Plasma Glucose: 174 mg/dL

## 2016-09-04 LAB — MAGNESIUM: MAGNESIUM: 1.8 mg/dL (ref 1.7–2.4)

## 2016-09-04 MED ORDER — POTASSIUM CHLORIDE CRYS ER 20 MEQ PO TBCR
20.0000 meq | EXTENDED_RELEASE_TABLET | Freq: Once | ORAL | Status: AC
  Administered 2016-09-04: 20 meq via ORAL
  Filled 2016-09-04: qty 1

## 2016-09-04 NOTE — Progress Notes (Signed)
INR level 4.78 and potassium 3.4 reported to Hospitalist - KCL  20 mEq administered PO.

## 2016-09-04 NOTE — Progress Notes (Signed)
Pharmacy Antibiotic Note  [name] is a 83 y.o. male admitted on 09/02/2016 with sepsis.  Pharmacy has been consulted for vancomycin and zosyn dosing.   Day # 3 of antibiotics BC negative to date Tick serologies pending Afebrile, WNL  Plan: Continue Vancomycin 1g IV q24hr  Continue Zosyn 3.375g IV q8hr Continue Doxycycline 100 mg po BID  Consider de-escalating antibiotics -- stop vancomycin?  Height: 6' (182.9 cm) Weight: 210 lb 12.2 oz (95.6 kg) IBW/kg (Calculated) : 77.6  Temp (24hrs), Avg:98 F (36.7 C), Min:97.6 F (36.4 C), Max:98.5 F (36.9 C)   Recent Labs Lab 09/02/16 1553 09/02/16 1603 09/02/16 1820 09/02/16 2008 09/02/16 2346 09/03/16 0227 09/04/16 0130  WBC 9.8  --   --   --   --  12.8*  --   CREATININE 2.01*  --   --   --   --  2.10* 1.84*  LATICACIDVEN  --  2.01* 3.64* 2.76* 1.4 1.3  --     Estimated Creatinine Clearance: 33.3 mL/min (A) (by C-G formula based on SCr of 1.84 mg/dL (H)).    Allergies  Allergen Reactions  . Demerol [Meperidine] Other (See Comments)    Causes hallucinations    Antimicrobials this admission: 7/6 Doxy >>  7/6 Zosyn >> 7/6 Vanc >>   Dose adjustments this admission: n/a  Microbiology results:  7/6 Blood cx: pending  Thank you Anette Guarneri, PharmD [phone] 09/04/16 11:15 AM

## 2016-09-04 NOTE — Progress Notes (Signed)
PROGRESS NOTE  [name]  EBR:[id number] DOB: [birth year]  DOA: 09/02/2016 PCP: Wenda Low, MD   Brief Narrative:   83 y.o. male with medical history significant for but not limited to PAFIB, CAD, DM 2, Aortic Stenosis status post AVR, Symptomatic Bradycardia status post pacemaker, presenting with fever and chills associated with generalised weakness associated with hypotension and tachypnea. Patient admitted for Sepsis/SIR - ? Source being associated R LE cellulitis .   Assessment & Plan:   Active Problems:   Atrial fibrillation (HCC)   Coronary artery disease   S/P AVR   Diabetes mellitus ([place])   Essential hypertension, benign   AKI (acute kidney injury) ([place])   Type 2 diabetes mellitus with vascular disease (HCC)   CKD (chronic kidney disease) stage 3, GFR 30-59 ml/min   Tick bite   Sepsis ([place])   Cellulitis   Severe sepsis ([place])  #1 Sepsis/SIRS: Source- possibly R LE Cellulitis Antibiotic coverage - de-escalate with discontinuation of vancomycin today 09/04/2016 Follow blood and urine cultures 2 view chest x-ray versus chest CT without contrast when more stabilized (1 view x-ray noted atelectasis versus pneumonia)   #2 Right Lower Extremity Cellulitis: Antibiotics Supportive care MRSA Screen History of tick exposure-follow up with serologies/continue doxycycline coverage for now  #3 Acute on Chronic Kidney Disease stage III: Due to diagnosis #1 with some element of dehydration Creatinine 1.5 on 01/26/2015 Admitting creatinine of 2.01 Judicious rehydration -watch for fluid overload monitoring of renal function and electrolytes Lasix and ACE inhibitor hold  #4 DM2: Continue sliding scale insulin Resume when hemodynamically stable and renal function improved  #5 AFib: CHA2DS2vacs score 5  Stable Continue anticoagulation    DVT prophylaxis: Coumadin Code Status: Full code Family Communication: Patient at bedside   Disposition Plan:  Home   Consultants:    Procedures:    Antimicrobials:  IV vancomycin and IV Zosyn doxycycline orally    Subjective: Denies fever or chills. Right lower extremity pain better. ORTHOSTATIC  Objective:  Vitals:   09/04/16 0400 09/04/16 0500 09/04/16 0600 09/04/16 0800  BP: 132/73   (!) 147/72  Pulse: (!) 59 (!) 59 60 60  Resp: (!) 21 20 19 15   Temp:    97.8 F (36.6 C)  TempSrc:    Oral  SpO2: 96% 95% 100% 98%  Weight:      Height:        Intake/Output Summary (Last 24 hours) at 09/04/16 1122 Last data filed at 09/04/16 1007  Gross per 24 hour  Intake          1659.67 ml  Output             1175 ml  Net           484.67 ml   Filed Weights   09/02/16 2240  Weight: 95.6 kg (210 lb 12.2 oz)    Examination:  General exam: Comfortable, no acute distress Respiratory system: Clear to auscultation. Respiratory effort normal. Cardiovascular system: S1 & S2 heard, RRR. No JVD, murmurs, rubs, gallops or clicks. No pedal edema. Gastrointestinal system: Abdomen is nondistended, soft and nontender. No organomegaly or masses felt. Normal bowel sounds heard. Central nervous system: Alert and oriented. No focal neurological deficits. Extremities:  no cyanosis, bipedal pitting edema (+) Skin:  erythematous patch involving right anteromedial thigh, warm to touch, and slightly tender to palpation. Not fluctuant. sychiatry: Judgement and insight appear normal. Mood & affect appropriate.     Data Reviewed: I have personally reviewed following  labs and imaging studies  CBC:  Recent Labs Lab 09/02/16 1553 09/03/16 0227  WBC 9.8 12.8*  NEUTROABS 8.6*  --   HGB 14.1 12.6*  HCT 44.1 40.2  MCV 98.2 98.3  PLT 145* 132*   Basic Metabolic Panel:  Recent Labs Lab 09/02/16 1553 09/03/16 0227 09/04/16 0130 09/04/16 0622  NA 135 137 138  --   K 4.2 4.1 3.4*  --   CL 100* 104 108  --   CO2 26 26 25   --   GLUCOSE 168* 150* 98  --   BUN 30* 28* 24*  --   CREATININE 2.01*  2.10* 1.84*  --   CALCIUM 8.9 8.0* 7.8*  --   MG  --  1.8  --  1.8  PHOS  --  3.1  --   --    GFR: Estimated Creatinine Clearance: 33.3 mL/min (A) (by C-G formula based on SCr of 1.84 mg/dL (H)). Liver Function Tests:  Recent Labs Lab 09/02/16 1553 09/03/16 0227  AST 24 27  ALT 14* 13*  ALKPHOS 49 36*  BILITOT 1.6* 1.5*  PROT 6.8 5.9*  ALBUMIN 3.8 3.0*   No results for input(s): LIPASE, AMYLASE in the last 168 hours. No results for input(s): AMMONIA in the last 168 hours. Coagulation Profile:  Recent Labs Lab 09/02/16 1553 09/03/16 0227 09/04/16 0130  INR 3.49 4.33* 4.78*   Cardiac Enzymes: No results for input(s): CKTOTAL, CKMB, CKMBINDEX, TROPONINI in the last 168 hours. BNP (last 3 results) No results for input(s): PROBNP in the last 8760 hours. HbA1C:  Recent Labs  09/03/16 0227  HGBA1C 7.7*   CBG:  Recent Labs Lab 09/03/16 0815 09/03/16 1152 09/03/16 1739 09/03/16 2131 09/04/16 0759  GLUCAP 100* 106* 131* 133* 76   Lipid Profile: No results for input(s): CHOL, HDL, LDLCALC, TRIG, CHOLHDL, LDLDIRECT in the last 72 hours. Thyroid Function Tests:  Recent Labs  09/03/16 0227  TSH 5.825*   Anemia Panel: No results for input(s): VITAMINB12, FOLATE, FERRITIN, TIBC, IRON, RETICCTPCT in the last 72 hours.  Sepsis Labs:  Recent Labs Lab 09/02/16 1553  09/02/16 1820 09/02/16 2008 09/02/16 2346 09/03/16 0227  PROCALCITON  --   --   --   --  1.11  --   WBC 9.8  --   --   --   --  12.8*  LATICACIDVEN  --   < > 3.64* 2.76* 1.4 1.3  < > = values in this interval not displayed.  Recent Results (from the past 240 hour(s))  Culture, blood (Routine x 2)     Status: None (Preliminary result)   Collection Time: 09/02/16  3:53 PM  Result Value Ref Range Status   Specimen Description BLOOD LEFT ANTECUBITAL  Final   Special Requests   Final    BOTTLES DRAWN AEROBIC AND ANAEROBIC Blood Culture adequate volume   Culture NO GROWTH 2 DAYS  Final   Report  Status PENDING  Incomplete  Culture, blood (Routine x 2)     Status: None (Preliminary result)   Collection Time: 09/02/16  4:57 PM  Result Value Ref Range Status   Specimen Description BLOOD LEFT ANTECUBITAL  Final   Special Requests   Final    BOTTLES DRAWN AEROBIC AND ANAEROBIC Blood Culture adequate volume   Culture NO GROWTH 2 DAYS  Final   Report Status PENDING  Incomplete  MRSA PCR Screening     Status: None   Collection Time: 09/03/16  5:19 AM  Result  Value Ref Range Status   MRSA by PCR NEGATIVE NEGATIVE Final    Comment:        The GeneXpert MRSA Assay (FDA approved for NASAL specimens only), is one component of a comprehensive MRSA colonization surveillance program. It is not intended to diagnose MRSA infection nor to guide or monitor treatment for MRSA infections.          Radiology Studies: Dg Chest Port 1 View  Result Date: 09/02/2016 CLINICAL DATA:  Weakness. Pt here with possible fever and chills; pt states hx of sepsis and worried is the same. Pt denies chest pain. Hx of diabetes and CAD. EXAM: PORTABLE CHEST 1 VIEW COMPARISON:  12/30/2014 FINDINGS: Changes from cardiac surgery and valve replacement are stable. The cardiac silhouette is enlarged. No mediastinal or hilar masses. There is persistent opacity at the left lung base obscuring the left heart border and left hemidiaphragm, consistent with a moderate effusion with associated atelectasis. Remainder of the lungs is clear. No right pleural effusion. No pneumothorax. Single lead left anterior chest wall pacemaker is stable. Skeletal structures are demineralized but grossly intact. IMPRESSION: 1. No acute cardiopulmonary disease. 2. Persistent left pleural effusion with associated lung base opacity. The lung base opacity is most likely atelectasis. Pneumonia is possible but felt less likely. Electronically Signed   By: Lajean Manes M.D.   On: 09/02/2016 16:22        Scheduled Meds: . doxycycline  100 mg  Oral Q12H  . insulin aspart  0-5 Units Subcutaneous QHS  . insulin aspart  0-9 Units Subcutaneous TID WC  . sodium chloride flush  3 mL Intravenous Q12H  . Warfarin - Pharmacist Dosing Inpatient   Does not apply q1800   Continuous Infusions: . sodium chloride 100 mL/hr at 09/03/16 2356  . piperacillin-tazobactam (ZOSYN)  IV 3.375 g (09/04/16 0912)  . vancomycin Stopped (09/03/16 1729)     LOS: 2 days    Time spent: 62    OSEI-BONSU,Makenzie Weisner, MD [facility] Pager [phone]  If 7PM-7AM, please contact night-coverage [email or web address] Password [email or web address] 09/04/2016, 11:22 AM

## 2016-09-04 NOTE — Progress Notes (Signed)
[place] for warfarin (continued from PTA) Indication: hx of atrial fibrillation  Allergies  Allergen Reactions  . Demerol [Meperidine] Other (See Comments)    Causes hallucinations    Patient Measurements: Height: 6' (182.9 cm) Weight: 210 lb 12.2 oz (95.6 kg) IBW/kg (Calculated) : 77.6  Vital Signs: Temp: 97.8 F (36.6 C) (07/08 0800) Temp Source: Oral (07/08 0800) BP: 147/72 (07/08 0800) Pulse Rate: 60 (07/08 0800)  Labs:  Recent Labs  09/02/16 1553 09/03/16 0227 09/04/16 0130  HGB 14.1 12.6*  --   HCT 44.1 40.2  --   PLT 145* 120*  --   APTT  --  45*  --   LABPROT 35.9* 42.6* 43.8*  INR 3.49 4.33* 4.78*  CREATININE 2.01* 2.10* 1.84*    Estimated Creatinine Clearance: 33.3 mL/min (A) (by C-G formula based on SCr of 1.84 mg/dL (H)).    Assessment: 83 yo male admitted with fever and chills. On PTA warfarin for afib. Pharmacy consulted to continue here. Last dose of warfarin PTA was on 7/5 at 8PM. I  INR elevated today at 4.78  PTA warfarin dose: 5mg  on Tues and 2.5mg  on all other days   Goal of Therapy:  INR 2-3 Monitor platelets by anticoagulation protocol: Yes   Plan:  Hold warfarin dose tonight  F/u INR in AM  Thank you Anette Guarneri, PharmD [phone] 09/04/16 11:14 AM

## 2016-09-04 NOTE — Plan of Care (Signed)
Problem: Safety: Goal: Ability to remain free from injury will improve Outcome: Progressing    Patient's verbalized understanding  Problem: Health Behavior/Discharge Planning: Goal: Ability to manage health-related needs will improve Outcome: Progressing Patient's verbalized understanding  Problem: Pain Managment: Goal: General experience of comfort will improve Outcome: Progressing Patient's verbalized understanding  Problem: Physical Regulation: Goal: Ability to maintain clinical measurements within normal limits will improve Outcome: Progressing Patient's verbalized understanding Goal: Will remain free from infection Outcome: Progressing Patient's verbalized understanding  Problem: Skin Integrity: Goal: Risk for impaired skin integrity will decrease Outcome: Progressing Patient's verbalized understanding  Problem: Tissue Perfusion: Goal: Risk factors for ineffective tissue perfusion will decrease Outcome: Progressing Patient's verbalized understanding

## 2016-09-04 NOTE — Progress Notes (Signed)
Run of V-tach 6 beats - patient denies any  Symptom- vital signs remain within expected range- hospitalist notified

## 2016-09-05 ENCOUNTER — Inpatient Hospital Stay (HOSPITAL_COMMUNITY): Payer: Medicare Other

## 2016-09-05 ENCOUNTER — Ambulatory Visit: Payer: Medicare Other | Admitting: Podiatry

## 2016-09-05 DIAGNOSIS — L03039 Cellulitis of unspecified toe: Secondary | ICD-10-CM

## 2016-09-05 DIAGNOSIS — I481 Persistent atrial fibrillation: Secondary | ICD-10-CM

## 2016-09-05 DIAGNOSIS — N179 Acute kidney failure, unspecified: Secondary | ICD-10-CM

## 2016-09-05 LAB — CBC
HEMATOCRIT: 38.4 % — AB (ref 39.0–52.0)
HEMOGLOBIN: 11.9 g/dL — AB (ref 13.0–17.0)
MCH: 30.9 pg (ref 26.0–34.0)
MCHC: 31 g/dL (ref 30.0–36.0)
MCV: 99.7 fL (ref 78.0–100.0)
Platelets: 142 10*3/uL — ABNORMAL LOW (ref 150–400)
RBC: 3.85 MIL/uL — AB (ref 4.22–5.81)
RDW: 14.3 % (ref 11.5–15.5)
WBC: 6.8 10*3/uL (ref 4.0–10.5)

## 2016-09-05 LAB — COMPREHENSIVE METABOLIC PANEL
ALBUMIN: 2.7 g/dL — AB (ref 3.5–5.0)
ALK PHOS: 34 U/L — AB (ref 38–126)
ALT: 15 U/L — AB (ref 17–63)
AST: 24 U/L (ref 15–41)
Anion gap: 5 (ref 5–15)
BILIRUBIN TOTAL: 1.1 mg/dL (ref 0.3–1.2)
BUN: 18 mg/dL (ref 6–20)
CALCIUM: 8.3 mg/dL — AB (ref 8.9–10.3)
CO2: 26 mmol/L (ref 22–32)
CREATININE: 1.63 mg/dL — AB (ref 0.61–1.24)
Chloride: 111 mmol/L (ref 101–111)
GFR calc Af Amer: 42 mL/min — ABNORMAL LOW (ref >60)
GFR, EST NON AFRICAN AMERICAN: 36 mL/min — AB (ref >60)
GLUCOSE: 127 mg/dL — AB (ref 65–99)
POTASSIUM: 4.1 mmol/L (ref 3.5–5.1)
Sodium: 142 mmol/L (ref 135–145)
TOTAL PROTEIN: 5.8 g/dL — AB (ref 6.5–8.1)

## 2016-09-05 LAB — URINALYSIS, ROUTINE W REFLEX MICROSCOPIC
Bacteria, UA: NONE SEEN
Bilirubin Urine: NEGATIVE
Glucose, UA: >=500 mg/dL — AB
KETONES UR: 20 mg/dL — AB
Leukocytes, UA: NEGATIVE
NITRITE: NEGATIVE
PH: 5 (ref 5.0–8.0)
PROTEIN: 30 mg/dL — AB
Specific Gravity, Urine: 1.026 (ref 1.005–1.030)

## 2016-09-05 LAB — GLUCOSE, CAPILLARY
GLUCOSE-CAPILLARY: 160 mg/dL — AB (ref 65–99)
Glucose-Capillary: 100 mg/dL — ABNORMAL HIGH (ref 65–99)
Glucose-Capillary: 117 mg/dL — ABNORMAL HIGH (ref 65–99)
Glucose-Capillary: 174 mg/dL — ABNORMAL HIGH (ref 65–99)

## 2016-09-05 LAB — PROTIME-INR
INR: 3.31
PROTHROMBIN TIME: 34.4 s — AB (ref 11.4–15.2)

## 2016-09-05 MED ORDER — MAGNESIUM SULFATE IN D5W 1-5 GM/100ML-% IV SOLN
1.0000 g | Freq: Once | INTRAVENOUS | Status: AC
Start: 2016-09-05 — End: 2016-09-05
  Administered 2016-09-05: 1 g via INTRAVENOUS
  Filled 2016-09-05: qty 100

## 2016-09-05 MED ORDER — WARFARIN SODIUM 1 MG PO TABS
1.0000 mg | ORAL_TABLET | Freq: Once | ORAL | Status: AC
  Administered 2016-09-05: 1 mg via ORAL
  Filled 2016-09-05: qty 1

## 2016-09-05 MED ORDER — LEVALBUTEROL HCL 1.25 MG/0.5ML IN NEBU: 1.2500 mg | INHALATION_SOLUTION | Freq: Four times a day (QID) | RESPIRATORY_TRACT | Status: DC | PRN

## 2016-09-05 NOTE — Evaluation (Signed)
Occupational Therapy Evaluation Patient Details Name: [name] MRN: [id number] DOB: [birth year] Today's Date: 09/05/2016    History of Present Illness 83 y.o. malewith medical history significant for but not limited to PAFIB, CAD, DM 2, Aortic Stenosis status post AVR, Symptomatic Bradycardia status post pacemaker,presenting with fever and chills associated with generalised weakness associated with hypotension and tachypnea. Patient admitted for Sepsis/SIR - ? Source being associated R LE cellulitis .   Clinical Impression   Pt admitted with above. He demonstrates the below listed deficits and will benefit from continued OT to maximize safety and independence with BADLs.  Pt presents to OT with generalized weakness, and mild balance deficits, as well as pain Lt thigh.  He requires min A for ADLs due to the pain as he is unable to access Lt foot.  He required min guard assist for functional mobility.  He feels he is improving, and getting closer to his baseline.  He would optimally like to discharge home rather than SNF, but is agreeable to SNF if needed       Follow Up Recommendations  SNF;Supervision/Assistance - 24 hour - may progress to [facility] and PT with 24 hour assist.    Equipment Recommendations  None recommended by OT    Recommendations for Other Services       Precautions / Restrictions Precautions Precautions: Fall Required Braces or Orthoses: Other Brace/Splint Other Brace/Splint: double upright AFO Lt foot  Restrictions Weight Bearing Restrictions: No      Mobility Bed Mobility Overal bed mobility: Needs Assistance Bed Mobility: Supine to Sit     Supine to sit: Supervision     General bed mobility comments: pt OOB in chair upon arrival; assist to bring L LE into bed  Transfers Overall transfer level: Needs assistance Equipment used: Standard walker Transfers: Sit to/from Stand;Stand Pivot Transfers Sit to Stand: Min guard Stand pivot transfers: Min  guard       General transfer comment: min guard for safety     Balance Overall balance assessment: Needs assistance;History of Falls Sitting-balance support: Feet supported;No upper extremity supported Sitting balance-Leahy Scale: Good     Standing balance support: Single extremity supported Standing balance-Leahy Scale: Fair Standing balance comment: Pt requires min guard assist with static standing                            ADL either performed or assessed with clinical judgement   ADL Overall ADL's : Needs assistance/impaired Eating/Feeding: Independent   Grooming: Wash/dry hands;Wash/dry face;Oral care;Brushing hair;Min guard;Standing   Upper Body Bathing: Supervision/ safety;Sitting   Lower Body Bathing: Minimal assistance;Sit to/from stand   Upper Body Dressing : Set up;Sitting   Lower Body Dressing: Minimal assistance;Sit to/from stand Lower Body Dressing Details (indicate cue type and reason): Pt unable to access Lt foot due to Lt thigh pain  Toilet Transfer: Min guard;Ambulation;Comfort height toilet;RW;BSC   Toileting- Clothing Manipulation and Hygiene: Min guard;Sit to/from stand       Functional mobility during ADLs: Surveyor, minerals     Praxis      Pertinent Vitals/Pain Pain Assessment: Faces Faces Pain Scale: Hurts little more Pain Location: Lt thigh  Pain Descriptors / Indicators: Aching;Grimacing;Guarding Pain Intervention(s): Limited activity within patient's tolerance;Monitored during session     Hand Dominance Right   Extremity/Trunk Assessment Upper Extremity Assessment Upper Extremity Assessment: Overall  WFL for tasks assessed   Lower Extremity Assessment Lower Extremity Assessment: Defer to PT evaluation   Cervical / Trunk Assessment Cervical / Trunk Assessment: Kyphotic   Communication Communication Communication: HOH   Cognition Arousal/Alertness: Awake/alert Behavior  During Therapy: WFL for tasks assessed/performed Overall Cognitive Status: Within Functional Limits for tasks assessed                                     General Comments  VSS.  Pt feels he is getting stronger and approaching his baseline.  He would prefer to discharge home, but is agreeable to SNF if needed     Exercises     Shoulder Instructions      Home Living Family/patient expects to be discharged to:: Private residence Living Arrangements: Spouse/significant other Available Help at Discharge: Family;Available 24 hours/day Type of Home: House Home Access: Ramped entrance     Home Layout: One level     Bathroom Shower/Tub: Teacher, early years/pre: Standard Bathroom Accessibility: Yes   Home Equipment: Walker - standard;Bedside commode;Grab bars - tub/shower   Additional Comments: son lives on the same property and can assist as needed       Prior Functioning/Environment Level of Independence: Independent with assistive device(s)        Comments: ambulates with SW.  Wife helps him get in showere sometimes per pt.  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  He is active in the community and drives        OT Problem List: Decreased strength;Decreased activity tolerance;Impaired balance (sitting and/or standing);Pain      OT Treatment/Interventions: Self-care/ADL training;DME and/or AE instruction;Therapeutic activities;Patient/family education;Balance training    OT Goals(Current goals can be found in the care plan section) Acute Rehab OT Goals Patient Stated Goal: go home OT Goal Formulation: With patient Time For Goal Achievement: 09/19/16 Potential to Achieve Goals: Good ADL Goals Pt Will Perform Grooming: with supervision;standing Pt Will Perform Lower Body Dressing: with supervision;sit to/from stand Pt Will Transfer to Toilet: with supervision;ambulating;regular height toilet;bedside commode Pt Will Perform Toileting -  Clothing Manipulation and hygiene: with supervision;sit to/from stand Pt Will Perform Tub/Shower Transfer: with min guard assist;ambulating;grab bars;rolling walker  OT Frequency: Min 2X/week   Barriers to D/C:            Co-evaluation              AM-PAC PT "6 Clicks" Daily Activity     Outcome Measure Help from another person eating meals?: None Help from another person taking care of personal grooming?: A Little Help from another person toileting, which includes using toliet, bedpan, or urinal?: A Little Help from another person bathing (including washing, rinsing, drying)?: A Little Help from another person to put on and taking off regular upper body clothing?: A Little Help from another person to put on and taking off regular lower body clothing?: A Little 6 Click Score: 19   End of Session Equipment Utilized During Treatment: Rolling walker;Gait belt Nurse Communication: Mobility status  Activity Tolerance: Patient tolerated treatment well Patient left: in chair;with call bell/phone within reach;with chair alarm set  OT Visit Diagnosis: Unsteadiness on feet (R26.81)                Time: 4650-3546 OT Time Calculation (min): 47 min Charges:  OT General Charges $OT Visit: 1 Procedure OT Evaluation $OT Eval Moderate Complexity: 1 Procedure  OT Treatments $Self Care/Home Management : 8-22 mins $Therapeutic Activity: 8-22 mins G-Codes:     [facility], OTR/L [phone]   Lucille Passy M 09/05/2016, 5:05 PM

## 2016-09-05 NOTE — NC FL2 (Signed)
[place] LEVEL OF CARE SCREENING TOOL     IDENTIFICATION  Patient Name: [name] Birthdate: August 28, 1925 Sex: male Admission Date (Current Location): 09/02/2016  [facility] and [place] Number:  [employer] and Address:  [facility] [place], [place] [address] [place], [place] [address]      Provider Number: [id number]  Attending Physician Name and Address:  Reyne Dumas, MD  Relative Name and Phone Number:       Current Level of Care: Hospital Recommended Level of Care: [place] Prior Approval Number:    Date Approved/Denied:   PASRR Number: [id number]  Discharge Plan: SNF    Current Diagnoses: Patient Active Problem List   Diagnosis Date Noted  . Tick bite 09/02/2016  . Sepsis ([place]) 09/02/2016  . Cellulitis 09/02/2016  . Severe sepsis ([place]) 09/02/2016  . Staphylococcus aureus bacteremia 01/26/2015  . FTT (failure to thrive) in adult   . Hip pain   . Groin pain   . Fever, unspecified 12/23/2014  . Type 2 diabetes mellitus with vascular disease ([place]) 12/23/2014  . CKD (chronic kidney disease) stage 3, GFR 30-59 ml/min 12/23/2014  . Bilateral hip pain   . Pulmonary edema   . Atypical pneumonia 10/03/2013  . Hypotension, unspecified 10/02/2013  . AKI (acute kidney injury) ([place]) 10/02/2013  . Iron deficiency anemia 10/02/2013  . Warfarin-induced coagulopathy ([place]) 10/02/2013  . Essential hypertension, benign 08/06/2013  . Cardiac pacemaker in situ 08/06/2013  . Edema 08/06/2013  . Encounter for therapeutic drug monitoring 03/26/2013  . Diabetes mellitus ([place]) 11/19/2012  . Dermatophytosis of nail   . Gastroesophageal reflux disease   . Symptomatic bradycardia 10/04/2012  . Atrial fibrillation ([place])   . Coronary artery disease   . S/P AVR     Orientation RESPIRATION BLADDER Height & Weight     Self, Time, Situation, Place  Normal Continent Weight: 210 lb 12.2 oz (95.6 kg) Height:  6' (182.9 cm)   BEHAVIORAL SYMPTOMS/MOOD NEUROLOGICAL BOWEL NUTRITION STATUS   (None)  (None) Continent Diet (Carb modified)  AMBULATORY STATUS COMMUNICATION OF NEEDS Skin   Limited Assist Verbally Other (Comment) (Redness on right leg)                       Personal Care Assistance Level of Assistance  Bathing, Feeding, Dressing Bathing Assistance: Limited assistance Feeding assistance: Limited assistance Dressing Assistance: Limited assistance     Functional Limitations Info  Sight, Hearing, Speech Sight Info: Adequate Hearing Info: Adequate Speech Info: Adequate    SPECIAL CARE FACTORS FREQUENCY  PT (By licensed PT), Blood pressure, OT (By licensed OT)     PT Frequency: 5 x week OT Frequency: 5 x week            Contractures Contractures Info: Not present    Additional Factors Info  Code Status, Allergies Code Status Info: Full Allergies Info: Demerol (Meperidine)           Current Medications (09/05/2016):  This is the current hospital active medication list Current Facility-Administered Medications  Medication Dose Route Frequency Provider Last Rate Last Dose  . 0.9 %  sodium chloride infusion   Intravenous Continuous Osei-Bonsu, Iona Beard, MD 100 mL/hr at 09/05/16 0948    . acetaminophen (TYLENOL) tablet 650 mg  650 mg Oral Q6H PRN Toy Baker, MD   650 mg at 09/05/16 0141   Or  . acetaminophen (TYLENOL) suppository 650 mg  650 mg Rectal Q6H PRN  Toy Baker, MD      . doxycycline (VIBRA-TABS) tablet 100 mg  100 mg Oral Q12H Doutova, Anastassia, MD   100 mg at 09/05/16 0948  . HYDROcodone-acetaminophen (NORCO/VICODIN) 5-325 MG per tablet 1-2 tablet  1-2 tablet Oral Q4H PRN Doutova, Anastassia, MD      . insulin aspart (novoLOG) injection 0-5 Units  0-5 Units Subcutaneous QHS Toy Baker, MD   2 Units at 09/02/16 2252  . insulin aspart (novoLOG) injection 0-9 Units  0-9 Units Subcutaneous TID WC Toy Baker, MD   2 Units at 09/04/16 1726  .  ondansetron (ZOFRAN) tablet 4 mg  4 mg Oral Q6H PRN Toy Baker, MD       Or  . ondansetron (ZOFRAN) injection 4 mg  4 mg Intravenous Q6H PRN Doutova, Anastassia, MD      . sodium chloride flush (NS) 0.9 % injection 3 mL  3 mL Intravenous Q12H Doutova, Anastassia, MD   3 mL at 09/05/16 0948  . warfarin (COUMADIN) tablet 1 mg  1 mg Oral ONCE-1800 Rolla Flatten, [facility]      . Warfarin - Pharmacist Dosing Inpatient   Does not apply q1800 Honor Loh Duncan at 09/04/16 1800     Discharge Medications: Please see discharge summary for a list of discharge medications.  Relevant Imaging Results:  Relevant Lab Results:   Additional Information SS#: [id number]  Candie Chroman, LCSW

## 2016-09-05 NOTE — Progress Notes (Signed)
[place] for warfarin (continued from PTA) Indication: hx of atrial fibrillation  Allergies  Allergen Reactions  . Demerol [Meperidine] Other (See Comments)    Causes hallucinations    Patient Measurements: Height: 6' (182.9 cm) Weight: 210 lb 12.2 oz (95.6 kg) IBW/kg (Calculated) : 77.6  Vital Signs: Temp: 98.3 F (36.8 C) (07/09 1136) Temp Source: Oral (07/09 1136) BP: 118/71 (07/09 1136) Pulse Rate: 60 (07/09 1136)  Labs:  Recent Labs  09/02/16 1553 09/03/16 0227 09/04/16 0130 09/05/16 0422  HGB 14.1 12.6*  --  11.9*  HCT 44.1 40.2  --  38.4*  PLT 145* 120*  --  142*  APTT  --  45*  --   --   LABPROT 35.9* 42.6* 43.8* 34.4*  INR 3.49 4.33* 4.78* 3.31  CREATININE 2.01* 2.10* 1.84* 1.63*    Estimated Creatinine Clearance: 37.6 mL/min (A) (by C-G formula based on SCr of 1.63 mg/dL (H)).    Assessment: 83 yo male admitted with fever and chills. On PTA warfarin for afib. Pharmacy consulted to continue here. Last dose of warfarin PTA was on 7/5 at [facility].   INR today remains SUPRAtherapeutic (INR 3.31 << 4.78, goal of 2-3). Hgb 11.9, plts 142. Will go low-dose warfarin today given trend down in INR and likelihood to continue trend down.  PTA warfarin dose: 5mg  on Tues and 2.5mg  on all other days   Goal of Therapy:  INR 2-3 Monitor platelets by anticoagulation protocol: Yes   Plan:  1. Warfarin 1 mg x 1 dose at 1800 today 2. Will continue to monitor for any signs/symptoms of bleeding and will follow up with PT/INR in the a.m.   Thank you for allowing pharmacy to be a part of this patient's care.  Alycia Rossetti, PharmD, BCPS Clinical Pharmacist Clinical phone for 09/05/2016 from 7a-3:30p: [phone] If after 3:30p, please call main pharmacy at: [phone] 09/05/2016 1:24 PM

## 2016-09-05 NOTE — Clinical Social Work Note (Signed)
Clinical Social Work Assessment  Patient Details  Name: [name] MRN: [id number] Date of Birth: 08-17-1925  Date of referral:  09/05/16               Reason for consult:  Facility Placement, Discharge Planning                Permission sought to share information with:  Facility Sport and exercise psychologist, Family Supports Permission granted to share information::  Yes, Verbal Permission Granted  Name::        Agency::  SNF's  Relationship::     Contact Information:     Housing/Transportation Living arrangements for the past 2 months:  Single Family Home Source of Information:  Patient, Medical Team Patient Interpreter Needed:  None Criminal Activity/Legal Involvement Pertinent to Current Situation/Hospitalization:  No - Comment as needed Significant Relationships:  Spouse, Adult Children Lives with:  Spouse Do you feel safe going back to the place where you live?  Yes Need for family participation in patient care:  Yes (Comment)  Care giving concerns:  PT recommending SNF once medically stable for discharge.   Social Worker assessment / plan:  CSW met with patient. No supports at bedside. CSW introduced role and explained that PT recommendations would be discussed. Patient agreeable to SNF placement. He was at [name] 1 1[facility]2 years ago and states it was a good experience. He wants his wife to review the SNF list before they make a decision on first preference. No further concerns. CSW encouraged patient to contact CSW as needed. CSW will continue to follow patient for support and facilitate discharge to SNF once medically stable. Patient is aware that he will likely discharge tomorrow.  Employment status:  Retired Forensic scientist:  Medicare PT Recommendations:  [place] / Referral to community resources:  [place]  Patient/Family's Response to care:  Patient agreeable to SNF placement. Patient's wife and son  supportive and involved in patient's care. Patient appreciated social work intervention.  Patient/Family's Understanding of and Emotional Response to Diagnosis, Current Treatment, and Prognosis:  Patient has a good understanding of the reason for admission and his need for rehab prior to returning home. Patient states he hopes he only has to stay for 3 weeks like when he was at [name]. Patient appears happy with hospital care.  Emotional Assessment Appearance:  Appears stated age Attitude/Demeanor/Rapport:  Other (Pleasant) Affect (typically observed):  Accepting, Appropriate, Calm, Pleasant Orientation:  Oriented to Self, Oriented to Place, Oriented to  Time, Oriented to Situation Alcohol / Substance use:  Never Used Psych involvement (Current and /or in the community):  No (Comment)  Discharge Needs  Concerns to be addressed:  Care Coordination Readmission within the last 30 days:  No Current discharge risk:  Dependent with Mobility Barriers to Discharge:  Continued Medical Work up   Fred Chroman, LCSW 09/05/2016, 3:29 PM

## 2016-09-05 NOTE — Progress Notes (Signed)
Physical Therapy Treatment Patient Details Name: [name] MRN: [id number] DOB: [birth year] Today's Date: 09/05/2016    History of Present Illness 83 y.o. malewith medical history significant for but not limited to PAFIB, CAD, DM 2, Aortic Stenosis status post AVR, Symptomatic Bradycardia status post pacemaker,presenting with fever and chills associated with generalised weakness associated with hypotension and tachypnea. Patient admitted for Sepsis/SIR - ? Source being associated R LE cellulitis .    PT Comments    Patient is progressing well toward mobility goals and VSS throughout session. Pt required mod A to stand and min A for bed mobility and ambulation. Continue to progress as tolerated.   Follow Up Recommendations  SNF;Supervision/Assistance - 24 hour     Equipment Recommendations  Rolling walker with 5" wheels (if pt does not have RW will need one)    Recommendations for Other Services       Precautions / Restrictions Precautions Precautions: Fall Required Braces or Orthoses: Other Brace/Splint Other Brace/Splint: left metal upright AFO Restrictions Weight Bearing Restrictions: No    Mobility  Bed Mobility Overal bed mobility: Needs Assistance Bed Mobility: Sit to Supine           General bed mobility comments: pt OOB in chair upon arrival; assist to bring L LE into bed  Transfers Overall transfer level: Needs assistance Equipment used: Standard walker Transfers: Sit to/from Stand Sit to Stand: Mod assist         General transfer comment: X2 attempts for each trial with pt using momentum and assist to power up into standing  Ambulation/Gait Ambulation/Gait assistance: Min assist (+ chair follow but not needed) Ambulation Distance (Feet): 135 Feet Assistive device: Standard walker Gait Pattern/deviations: Decreased dorsiflexion - left;Trunk flexed;Wide base of support;Step-through pattern;Decreased stride length;Decreased step length - left      General Gait Details: cues for posture and proximity of RW; slow, steady gait   Stairs            Wheelchair Mobility    Modified Rankin (Stroke Patients Only)       Balance Overall balance assessment: Needs assistance;History of Falls   Sitting balance-Leahy Scale: Good     Standing balance support: Single extremity supported Standing balance-Leahy Scale: Fair Standing balance comment: pt able to stand with single UE support to urinate                            Cognition Arousal/Alertness: Awake/alert Behavior During Therapy: WFL for tasks assessed/performed Overall Cognitive Status: Within Functional Limits for tasks assessed                                        Exercises      General Comments General comments (skin integrity, edema, etc.): VSS      Pertinent Vitals/Pain Pain Assessment: No/denies pain    Home Living                      Prior Function            PT Goals (current goals can now be found in the care plan section) Acute Rehab PT Goals Patient Stated Goal: go home PT Goal Formulation: With patient Time For Goal Achievement: 09/17/16 Potential to Achieve Goals: Good Progress towards PT goals: Progressing toward goals    Frequency    Min 3X/week  PT Plan Current plan remains appropriate    Co-evaluation              AM-PAC PT "6 Clicks" Daily Activity  Outcome Measure  Difficulty turning over in bed (including adjusting bedclothes, sheets and blankets)?: A Little Difficulty moving from lying on back to sitting on the side of the bed? : A Lot Difficulty sitting down on and standing up from a chair with arms (e.g., wheelchair, bedside commode, etc,.)?: Total Help needed moving to and from a bed to chair (including a wheelchair)?: A Little Help needed walking in hospital room?: A Little Help needed climbing 3-5 steps with a railing? : A Little 6 Click Score: 15    End of  Session Equipment Utilized During Treatment: Gait belt Activity Tolerance: Patient tolerated treatment well Patient left: with call bell/phone within reach;in bed Nurse Communication: Mobility status PT Visit Diagnosis: Unsteadiness on feet (R26.81);Muscle weakness (generalized) (M62.81)     Time: 9741-6384 PT Time Calculation (min) (ACUTE ONLY): 38 min  Charges:  $Gait Training: 23-37 mins $Therapeutic Activity: 8-22 mins                    G Codes:       Earney Navy, PTA Pager: [phone]     Darliss Cheney 09/05/2016, 4:38 PM

## 2016-09-05 NOTE — Progress Notes (Signed)
OT Cancellation Note  Patient Details Name: [name] MRN: [id number] DOB: 1926-02-04   Cancelled Treatment:    Pt not seen by OT due to pt eating lunch.  Will reattempt.  Deloria Brassfield [place], OTR/L [phone]   Lucille Passy M 09/05/2016, 1:17 PM

## 2016-09-05 NOTE — Care Management Note (Signed)
Case Management Note  Patient Details  Name: JAVIOUS HALLISEY MRN: [id number] Date of Birth: 09/26/1925  Subjective/Objective:  From home with wife, presents with sepsis vs cellulitis from a tick bite, acute on chronic kidney disease, dm2, afib. For possible dc in am to SNF.                  Action/Plan: NCM will follow along with CSW for dc needs.   Expected Discharge Date:  09/06/16               Expected Discharge Plan:  Skilled Nursing Facility  In-House Referral:  Clinical Social Work  Discharge planning Services  CM Consult  Post Acute Care Choice:    Choice offered to:     DME Arranged:    DME Agency:     HH Arranged:    Comfort Agency:     Status of Service:  Completed, signed off  If discussed at [employer], dates discussed:    Additional Comments:  Zenon Mayo, RN 09/05/2016, 4:33 PM

## 2016-09-05 NOTE — Progress Notes (Signed)
PROGRESS NOTE  [name]  XBW:[id number] DOB: 1926/02/27  DOA: 09/02/2016 PCP: Wenda Low, MD   Brief Narrative:   83 y.o. male with medical history significant for but not limited to PAFIB, CAD, DM 2, Aortic Stenosis status post AVR, Symptomatic Bradycardia status post pacemaker, presenting with fever and chills associated with generalised weakness associated with hypotension and tachypnea. Patient admitted for Sepsis/SIR - ? Source being associated R LE cellulitis .   Assessment & Plan:   Active Problems:   Atrial fibrillation (HCC)   Coronary artery disease   S/P AVR   Diabetes mellitus ([place])   Essential hypertension, benign   AKI (acute kidney injury) ([place])   Type 2 diabetes mellitus with vascular disease ([place])   CKD (chronic kidney disease) stage 3, GFR 30-59 ml/min   Tick bite   Sepsis ([place])   Cellulitis   Severe sepsis ([place])   #1 Sepsis/SIRS: Source- possibly R LE Cellulitis, probable LLL pna  Antibiotic coverage - de-escalate with discontinuation of vancomycin today 09/04/2016  blood and urine cultures ngsf Repeat CXR to definitely r/o PNA    #2 Right Lower Extremity Cellulitis: Antibiotics narrowed to doxy and dc zosyn History of tick exposure-lyme titer negative /continue doxycycline coverage for now  #3 Acute on Chronic Kidney Disease stage III: Due to diagnosis #1 with some element of dehydration Creatinine 1.5 on 01/26/2015 Admitting creatinine of 2.01>1.63 Lasix and ACE inhibitor hold  #4 DM2: Continue sliding scale insulin Resume when hemodynamically stable and renal function improved  #5 AFib: CHA2DS2vacs score 5 INR elevated ,slowly improving , goal for AVR and a fib 2-3     DVT prophylaxis: Coumadin Code Status: Full code Family Communication: Patient at bedside   Disposition Plan:  Likely dc in am    Consultants:    Procedures:    Antimicrobials:  Anti-infectives    Start     Dose/Rate Route Frequency Ordered Stop   09/03/16 1600  vancomycin (VANCOCIN) IVPB 1000 mg/200 mL premix  Status:  Discontinued     1,000 mg 200 mL/hr over 60 Minutes Intravenous Every 24 hours 09/02/16 2310 09/05/16 0823   09/03/16 0600  doxycycline (VIBRA-TABS) tablet 100 mg     100 mg Oral Every 12 hours 09/02/16 2241     09/03/16 0100  piperacillin-tazobactam (ZOSYN) IVPB 3.375 g     3.375 g 12.5 mL/hr over 240 Minutes Intravenous Every 8 hours 09/02/16 2310     09/02/16 1800  doxycycline (VIBRAMYCIN) 100 mg in dextrose 5 % 250 mL IVPB     100 mg 125 mL/hr over 120 Minutes Intravenous  Once 09/02/16 1730 09/02/16 2110   09/02/16 1615  piperacillin-tazobactam (ZOSYN) IVPB 3.375 g     3.375 g 100 mL/hr over 30 Minutes Intravenous  Once 09/02/16 1613 09/02/16 1809   09/02/16 1615  vancomycin (VANCOCIN) IVPB 1000 mg/200 mL premix     1,000 mg 200 mL/hr over 60 Minutes Intravenous  Once 09/02/16 1613 09/02/16 1849         Subjective: no fever or chills. Right lower extremity pain better.  Objective:  Vitals:   09/05/16 0257 09/05/16 0300 09/05/16 0715 09/05/16 1136  BP: 118/72 118/72 117/67 118/71  Pulse: (!) 58 (!) 59 60 60  Resp: (!) 25 19 (!) 22 18  Temp: 98.3 F (36.8 C)  98 F (36.7 C) 98.3 F (36.8 C)  TempSrc: Oral  Oral Oral  SpO2: 96% 95% 95% 97%  Weight:      Height:  Intake/Output Summary (Last 24 hours) at 09/05/16 1301 Last data filed at 09/05/16 0900  Gross per 24 hour  Intake             3850 ml  Output             1070 ml  Net             2780 ml   Filed Weights   09/02/16 2240  Weight: 95.6 kg (210 lb 12.2 oz)    Examination:  General exam: Comfortable, no acute distress Respiratory system: Clear to auscultation. Respiratory effort normal. Cardiovascular system: S1 & S2 heard, RRR. No JVD, murmurs, rubs, gallops or clicks. No pedal edema. Gastrointestinal system: Abdomen is nondistended, soft and nontender. No organomegaly or masses felt. Normal bowel sounds heard. Central  nervous system: Alert and oriented. No focal neurological deficits. Extremities:  no cyanosis, right LE shows erythema which has improved  Skin:  erythematous patch involving right anteromedial thigh, warm to touch, and slightly tender to palpation. Not fluctuant. sychiatry: Judgement and insight appear normal. Mood & affect appropriate.     Data Reviewed: I have personally reviewed following labs and imaging studies  CBC:  Recent Labs Lab 09/02/16 1553 09/03/16 0227 09/05/16 0422  WBC 9.8 12.8* 6.8  NEUTROABS 8.6*  --   --   HGB 14.1 12.6* 11.9*  HCT 44.1 40.2 38.4*  MCV 98.2 98.3 99.7  PLT 145* 120* 428*   Basic Metabolic Panel:  Recent Labs Lab 09/02/16 1553 09/03/16 0227 09/04/16 0130 09/04/16 0622 09/05/16 0422  NA 135 137 138  --  142  K 4.2 4.1 3.4*  --  4.1  CL 100* 104 108  --  111  CO2 26 26 25   --  26  GLUCOSE 168* 150* 98  --  127*  BUN 30* 28* 24*  --  18  CREATININE 2.01* 2.10* 1.84*  --  1.63*  CALCIUM 8.9 8.0* 7.8*  --  8.3*  MG  --  1.8  --  1.8  --   PHOS  --  3.1  --   --   --    GFR: Estimated Creatinine Clearance: 37.6 mL/min (A) (by C-G formula based on SCr of 1.63 mg/dL (H)). Liver Function Tests:  Recent Labs Lab 09/02/16 1553 09/03/16 0227 09/05/16 0422  AST 24 27 24   ALT 14* 13* 15*  ALKPHOS 49 36* 34*  BILITOT 1.6* 1.5* 1.1  PROT 6.8 5.9* 5.8*  ALBUMIN 3.8 3.0* 2.7*   No results for input(s): LIPASE, AMYLASE in the last 168 hours. No results for input(s): AMMONIA in the last 168 hours. Coagulation Profile:  Recent Labs Lab 09/02/16 1553 09/03/16 0227 09/04/16 0130 09/05/16 0422  INR 3.49 4.33* 4.78* 3.31   Cardiac Enzymes: No results for input(s): CKTOTAL, CKMB, CKMBINDEX, TROPONINI in the last 168 hours. BNP (last 3 results) No results for input(s): PROBNP in the last 8760 hours. HbA1C:  Recent Labs  09/03/16 0227  HGBA1C 7.7*   CBG:  Recent Labs Lab 09/04/16 1134 09/04/16 1718 09/04/16 2059  09/05/16 0832 09/05/16 1136  GLUCAP 107* 177* 179* 100* 117*   Lipid Profile: No results for input(s): CHOL, HDL, LDLCALC, TRIG, CHOLHDL, LDLDIRECT in the last 72 hours. Thyroid Function Tests:  Recent Labs  09/03/16 0227  TSH 5.825*   Anemia Panel: No results for input(s): VITAMINB12, FOLATE, FERRITIN, TIBC, IRON, RETICCTPCT in the last 72 hours.  Sepsis Labs:  Recent Labs Lab 09/02/16 1553  09/02/16 1820  09/02/16 2008 09/02/16 2346 09/03/16 0227 09/05/16 0422  PROCALCITON  --   --   --   --  1.11  --   --   WBC 9.8  --   --   --   --  12.8* 6.8  LATICACIDVEN  --   < > 3.64* 2.76* 1.4 1.3  --   < > = values in this interval not displayed.  Recent Results (from the past 240 hour(s))  Culture, blood (Routine x 2)     Status: None (Preliminary result)   Collection Time: 09/02/16  3:53 PM  Result Value Ref Range Status   Specimen Description BLOOD LEFT ANTECUBITAL  Final   Special Requests   Final    BOTTLES DRAWN AEROBIC AND ANAEROBIC Blood Culture adequate volume   Culture NO GROWTH 3 DAYS  Final   Report Status PENDING  Incomplete  Culture, blood (Routine x 2)     Status: None (Preliminary result)   Collection Time: 09/02/16  4:57 PM  Result Value Ref Range Status   Specimen Description BLOOD LEFT ANTECUBITAL  Final   Special Requests   Final    BOTTLES DRAWN AEROBIC AND ANAEROBIC Blood Culture adequate volume   Culture NO GROWTH 3 DAYS  Final   Report Status PENDING  Incomplete  MRSA PCR Screening     Status: None   Collection Time: 09/03/16  5:19 AM  Result Value Ref Range Status   MRSA by PCR NEGATIVE NEGATIVE Final    Comment:        The GeneXpert MRSA Assay (FDA approved for NASAL specimens only), is one component of a comprehensive MRSA colonization surveillance program. It is not intended to diagnose MRSA infection nor to guide or monitor treatment for MRSA infections.          Radiology Studies: No results found.      Scheduled  Meds: . doxycycline  100 mg Oral Q12H  . insulin aspart  0-5 Units Subcutaneous QHS  . insulin aspart  0-9 Units Subcutaneous TID WC  . sodium chloride flush  3 mL Intravenous Q12H  . Warfarin - Pharmacist Dosing Inpatient   Does not apply q1800   Continuous Infusions: . sodium chloride 100 mL/hr at 09/05/16 0948  . magnesium sulfate 1 - 4 g bolus IVPB 1 g (09/05/16 1220)  . piperacillin-tazobactam (ZOSYN)  IV 3.375 g (09/05/16 0948)     LOS: 3 days    Time spent: [place], MD [facility] Pager [phone]  If 7PM-7AM, please contact night-coverage [email or web address] Password [email or web address] 09/05/2016, 1:01 PM

## 2016-09-05 NOTE — Clinical Social Work Placement (Signed)
   CLINICAL SOCIAL WORK PLACEMENT  NOTE  Date:  09/05/2016  Patient Details  Name: [name] MRN: [id number] Date of Birth: 1926-01-18  Clinical Social Work is seeking post-discharge placement for this patient at the [place] level of care (*CSW will initial, date and re-position this form in  chart as items are completed):  Yes   Patient/family provided with [place] Work Department's list of facilities offering this level of care within the geographic area requested by the patient (or if unable, by the patient's family).  Yes   Patient/family informed of their freedom to choose among providers that offer the needed level of care, that participate in Medicare, Medicaid or managed care program needed by the patient, have an available bed and are willing to accept the patient.  Yes   Patient/family informed of [facility]'s ownership interest in [facility] and [facility], as well as of the fact that they are under no obligation to receive care at these facilities.  PASRR submitted to EDS on 09/05/16     PASRR number received on       Existing PASRR number confirmed on 09/05/16     FL2 transmitted to all facilities in geographic area requested by pt/family on 09/05/16     FL2 transmitted to all facilities within larger geographic area on       Patient informed that his/her managed care company has contracts with or will negotiate with certain facilities, including the following:            Patient/family informed of bed offers received.  Patient chooses bed at       Physician recommends and patient chooses bed at      Patient to be transferred to   on  .  Patient to be transferred to facility by       Patient family notified on   of transfer.  Name of family member notified:        PHYSICIAN Please sign FL2     Additional Comment:    _______________________________________________ Candie Chroman, LCSW 09/05/2016, 3:32  PM

## 2016-09-06 LAB — COMPREHENSIVE METABOLIC PANEL
ALT: 14 U/L — AB (ref 17–63)
AST: 22 U/L (ref 15–41)
Albumin: 2.7 g/dL — ABNORMAL LOW (ref 3.5–5.0)
Alkaline Phosphatase: 35 U/L — ABNORMAL LOW (ref 38–126)
Anion gap: 8 (ref 5–15)
BILIRUBIN TOTAL: 1 mg/dL (ref 0.3–1.2)
BUN: 18 mg/dL (ref 6–20)
CO2: 22 mmol/L (ref 22–32)
CREATININE: 1.48 mg/dL — AB (ref 0.61–1.24)
Calcium: 8.2 mg/dL — ABNORMAL LOW (ref 8.9–10.3)
Chloride: 111 mmol/L (ref 101–111)
GFR calc Af Amer: 47 mL/min — ABNORMAL LOW (ref >60)
GFR, EST NON AFRICAN AMERICAN: 40 mL/min — AB (ref >60)
Glucose, Bld: 144 mg/dL — ABNORMAL HIGH (ref 65–99)
POTASSIUM: 3.4 mmol/L — AB (ref 3.5–5.1)
Sodium: 141 mmol/L (ref 135–145)
TOTAL PROTEIN: 5.9 g/dL — AB (ref 6.5–8.1)

## 2016-09-06 LAB — ROCKY MTN SPOTTED FVR ABS PNL(IGG+IGM)
RMSF IGG: NEGATIVE
RMSF IgM: 0.26 index (ref 0.00–0.89)

## 2016-09-06 LAB — LYME DISEASE DNA BY PCR(BORRELIA BURG): Lyme Disease(B.burgdorferi)PCR: NEGATIVE

## 2016-09-06 LAB — PROTIME-INR
INR: 3.03
PROTHROMBIN TIME: 32 s — AB (ref 11.4–15.2)

## 2016-09-06 LAB — GLUCOSE, CAPILLARY
GLUCOSE-CAPILLARY: 95 mg/dL (ref 65–99)
Glucose-Capillary: 141 mg/dL — ABNORMAL HIGH (ref 65–99)

## 2016-09-06 MED ORDER — FUROSEMIDE 20 MG PO TABS: 20.0000 mg | ORAL_TABLET | Freq: Two times a day (BID) | ORAL | 0 refills | Status: DC

## 2016-09-06 MED ORDER — WARFARIN SODIUM 1 MG PO TABS
1.0000 mg | ORAL_TABLET | Freq: Once | ORAL | Status: DC
  Filled 2016-09-06: qty 1

## 2016-09-06 MED ORDER — RAMIPRIL 2.5 MG PO CAPS: 2.5000 mg | ORAL_CAPSULE | Freq: Every day | ORAL | 9 refills | Status: DC

## 2016-09-06 MED ORDER — DOXYCYCLINE HYCLATE 100 MG PO TABS: 100.0000 mg | ORAL_TABLET | Freq: Two times a day (BID) | ORAL | 0 refills | Status: AC

## 2016-09-06 MED ORDER — POTASSIUM CHLORIDE CRYS ER 20 MEQ PO TBCR
40.0000 meq | EXTENDED_RELEASE_TABLET | Freq: Once | ORAL | Status: AC
  Administered 2016-09-06: 40 meq via ORAL
  Filled 2016-09-06: qty 2

## 2016-09-06 MED ORDER — FUROSEMIDE 10 MG/ML IJ SOLN
40.0000 mg | Freq: Once | INTRAMUSCULAR | Status: AC
  Administered 2016-09-06: 40 mg via INTRAVENOUS
  Filled 2016-09-06: qty 4

## 2016-09-06 NOTE — Care Management Note (Addendum)
Case Management Note Previous CM note initiated by Zenon Mayo, RN 09/05/2016, 4:33 PM    Patient Details  Name: [name] MRN: [id number] Date of Birth: 04-Jun-1925  Subjective/Objective:  From home with wife, presents with sepsis vs cellulitis from a tick bite, acute on chronic kidney disease, dm2, afib. For possible dc in am to SNF.                 Action/Plan: NCM will follow along with CSW for dc needs.   Expected Discharge Date:  09/06/16               Expected Discharge Plan:  Skilled Nursing Facility  In-House Referral:  Clinical Social Work  Discharge planning Services  CM Consult  Post Acute Care Choice:  Home Health Choice offered to:  Patient, Spouse  DME Arranged:  Walker rolling (pt has RW already) DME Agency:  NA  HH Arranged:  PT- pt refused services HH Agency:   NA  Status of Service:  Completed, signed off  If discussed at [place] of Stay Meetings, dates discussed:    Discharge Disposition: home/self care   Additional Comments:  09/06/16- 1430- Marvetta Gibbons RN, CM- noted pt wants to return home with wife- refusing SNF for rehab- orders placed for HHPT/OT/aide- in to speak with pt at bedside- and offer choice- per pt he has used HH in the past- states he is familiar with services however states that he does not feel like he needs them at this time- reports that his wife and grand-daughter will be there to assist him. Pt has RW for home already- discussed with pt recommendations by PT for STSNF for rehab as a safe discharge- pt adamant about returning home and not wanting SNF or HH - willing to wait to wife gets here to see what she thinks about having [facility] services-  Per bedside RN -wife has been contacted and is on her way to hospital- will return to speak with pt and wife when she arrives.   Update- 1645- pt's wife arrived to bedside- spoke again with pt with wife present- per conversation both feel pt will do better without [facility] services at  this time and decline any referral- wife states if pt does not do well she will f/u with PCP- wife reports pt has had [facility] services in past after staying in rehab- she states that pt uses walker to get around and feels like he will do ok. No HH services arranged at discharge.   Dahlia Client [place], RN 09/06/2016, 4:08 PM [phone]

## 2016-09-06 NOTE — Progress Notes (Signed)
[facility] for warfarin (continued from PTA) Indication: hx of atrial fibrillation  Allergies  Allergen Reactions  . Demerol [Meperidine] Other (See Comments)    Causes hallucinations    Patient Measurements: Height: 6' (182.9 cm) Weight: 210 lb 12.2 oz (95.6 kg) IBW/kg (Calculated) : 77.6  Vital Signs: Temp: 97.6 F (36.4 C) (07/10 0348) Temp Source: Oral (07/10 0348) BP: 134/71 (07/10 0953) Pulse Rate: 69 (07/10 0953)  Labs:  Recent Labs  09/04/16 0130 09/05/16 0422 09/06/16 0238  HGB  --  11.9*  --   HCT  --  38.4*  --   PLT  --  142*  --   LABPROT 43.8* 34.4* 32.0*  INR 4.78* 3.31 3.03  CREATININE 1.84* 1.63* 1.48*    Estimated Creatinine Clearance: 41.4 mL/min (A) (by C-G formula based on SCr of 1.48 mg/dL (H)).    Assessment: 83 yo male admitted with fever and chills. On PTA warfarin for afib. Pharmacy consulted to continue here. Last dose of warfarin PTA was on 7/5 at [facility].   INR today is basically at goal (INR 3.03 << 3.31, goal of 2-3). Hgb 11.9, plts 142. Will continue with low-dose warfarin today given interaction with Doxy.  PTA warfarin dose: 5mg  on Tues and 2.5mg  on all other days   Goal of Therapy:  INR 2-3 Monitor platelets by anticoagulation protocol: Yes   Plan:  1. Repeat Warfarin 1 mg x 1 dose at 1800 today (if still here) 2. Will continue to monitor for any signs/symptoms of bleeding and will follow up with PT/INR in the a.m. (if still here)  Thank you for allowing pharmacy to be a part of this patient's care.  Alycia Rossetti, PharmD, BCPS Clinical Pharmacist Clinical phone for 09/06/2016 from 7a-3:30p: [phone] If after 3:30p, please call main pharmacy at: [phone] 09/06/2016 11:29 AM

## 2016-09-06 NOTE — Progress Notes (Signed)
Occupational Therapy Treatment Patient Details Name: [name] MRN: [id number] DOB: 1925/08/30 Today's Date: 09/06/2016    History of present illness 83 y.o. malewith medical history significant for but not limited to PAFIB, CAD, DM 2, Aortic Stenosis status post AVR, Symptomatic Bradycardia status post pacemaker,presenting with fever and chills associated with generalised weakness associated with hypotension and tachypnea. Patient admitted for Sepsis/SIR - ? Source being associated R LE cellulitis .   OT comments  Pt requires a bit more assistance with transitions sit to stand today.  He is able to perform ADLs with min A, and transfers with min A.  He is insistent on discharging home, and states wife and 50 y.o. Granddaughter will be able to assist him as needed.  As long as he has adequate support at home, feel he will be safe to discharge with the below listed recommendations   Follow Up Recommendations  Home health OT;Supervision/Assistance - 24 hour    Equipment Recommendations  None recommended by OT    Recommendations for Other Services      Precautions / Restrictions Precautions Precautions: Fall Required Braces or Orthoses: Other Brace/Splint Other Brace/Splint: double upright AFO Lt foot        Mobility Bed Mobility               General bed mobility comments: Pt sitting up in chair   Transfers Overall transfer level: Needs assistance Equipment used: Standard walker Transfers: Sit to/from Stand;Stand Pivot Transfers Sit to Stand: Min assist Stand pivot transfers: Min guard       General transfer comment: intermittent min A to power up into standing     Balance Overall balance assessment: Needs assistance;History of Falls Sitting-balance support: Feet supported;No upper extremity supported Sitting balance-Leahy Scale: Good     Standing balance support: Single extremity supported Standing balance-Leahy Scale: Fair                              ADL either performed or assessed with clinical judgement   ADL Overall ADL's : Needs assistance/impaired                     Lower Body Dressing: Minimal assistance;Sit to/from stand Lower Body Dressing Details (indicate cue type and reason): Pt able to don pants, but requires assist to don Lt sock and shoe Toilet Transfer: Minimal assistance;Ambulation;Comfort height toilet;BSC;RW Toilet Transfer Details (indicate cue type and reason): Pt requires assist to power up into standing  Toileting- Clothing Manipulation and Hygiene: Min guard;Sit to/from stand       Functional mobility during ADLs: Minimal assistance;Rolling walker General ADL Comments: Pt with intermittent difficulty moving into standing.   He insists his wife will be available to assist him as needed as will his 8 y.o. granddaughter.       Vision       Perception     Praxis      Cognition Arousal/Alertness: Awake/alert Behavior During Therapy: WFL for tasks assessed/performed Overall Cognitive Status: Within Functional Limits for tasks assessed                                          Exercises     Shoulder Instructions       General Comments VSS.  Pt attempted to don jeans, however, unable to fasten them even  with signficant work and effort.  Pt stated jeans fit him PTA - RN notified     Pertinent Vitals/ Pain       Pain Assessment: Faces Faces Pain Scale: Hurts little more Pain Location: Lt thigh  Pain Descriptors / Indicators: Aching;Grimacing;Guarding Pain Intervention(s): Monitored during session  Home Living                                          Prior Functioning/Environment              Frequency  Min 2X/week        Progress Toward Goals  OT Goals(current goals can now be found in the care plan section)  Progress towards OT goals: Progressing toward goals     Plan Discharge plan needs to be updated     Co-evaluation                 AM-PAC PT "6 Clicks" Daily Activity     Outcome Measure   Help from another person eating meals?: None Help from another person taking care of personal grooming?: A Little Help from another person toileting, which includes using toliet, bedpan, or urinal?: A Little Help from another person bathing (including washing, rinsing, drying)?: A Little Help from another person to put on and taking off regular upper body clothing?: A Little Help from another person to put on and taking off regular lower body clothing?: A Little 6 Click Score: 19    End of Session Equipment Utilized During Treatment: Gait belt;Other (comment) (Standard walker )  OT Visit Diagnosis: Unsteadiness on feet (R26.81)   Activity Tolerance Patient tolerated treatment well   Patient Left in chair;with call bell/phone within reach;with chair alarm set   Nurse Communication Mobility status        Time: 3009-2330 OT Time Calculation (min): 36 min  Charges: OT General Charges $OT Visit: 1 Procedure OT Treatments $Self Care/Home Management : 23-37 mins  [facility], OTR/L [phone]    Lucille Passy M 09/06/2016, 12:51 PM

## 2016-09-06 NOTE — Progress Notes (Signed)
Pt discharged home with wife. IV and telemetry box removed. Pt and pt's wife received discharge instructions and all questions were answered. Pt left with all of his belongings. Pt left the unit via wheelchair and was accompanied by pt's nurse tech.   Grant Fontana BSN, RN

## 2016-09-06 NOTE — Care Management Important Message (Signed)
Important Message  Patient Details  Name: [name] MRN: [id number] Date of Birth: [birth year]   Medicare Important Message Given:  Yes    [name] 09/06/2016, 2:02 PM

## 2016-09-06 NOTE — Discharge Summary (Addendum)
Physician Discharge Summary  [name] MRN: [id number] DOB/AGE: [birth year] 83 y.o.  PCP: Wenda Low, MD   Admit date: 09/02/2016 Discharge date: 09/06/2016  Discharge Diagnoses:    Active Problems:   Atrial fibrillation (HCC)   Coronary artery disease   S/P AVR   Diabetes mellitus ([place])   Essential hypertension, benign   AKI (acute kidney injury) ([place])   Type 2 diabetes mellitus with vascular disease (HCC)   CKD (chronic kidney disease) stage 3, GFR 30-59 ml/min   Tick bite   Sepsis ([place])   Cellulitis   Severe sepsis ([place])    Follow-up recommendations Follow-up with PCP in 3-5 days , including all  additional recommended appointments as below Follow-up CBC, CMP in 3-5 days PCP requested to follow-up on the results of Ehrlichia antibody panel Resume  ACE inhibitor if renal function remained stable over the course of next 10-14 days Daily INR, adjust dose of Coumadin based on daily INR     Current Discharge Medication List    START taking these medications   Details  doxycycline (VIBRA-TABS) 100 MG tablet Take 1 tablet (100 mg total) by mouth every 12 (twelve) hours. Qty: 14 tablet, Refills: 0      CONTINUE these medications which have CHANGED   Details  furosemide (LASIX) 20 MG tablet Take 1 tablet (20 mg total) by mouth 2 (two) times daily. Qty: 30 tablet, Refills: 0    ramipril (ALTACE) 2.5 MG capsule Take 1 capsule (2.5 mg total) by mouth daily. Qty: 30 capsule, Refills: 9      CONTINUE these medications which have NOT CHANGED   Details  acetaminophen (TYLENOL) 325 MG tablet Take 325-650 mg by mouth every 6 (six) hours as needed for mild pain or headache.    Coenzyme Q10 (CO Q 10 PO) Take 1 capsule by mouth daily.    glimepiride (AMARYL) 1 MG tablet Take 1 mg by mouth 2 (two) times daily.     nitroGLYCERIN (NITROSTAT) 0.4 MG SL tablet Place 1 tablet (0.4 mg total) under the tongue every 5 (five) minutes x 3 doses as needed for chest pain. Qty:  25 tablet, Refills: 6    tamsulosin (FLOMAX) 0.4 MG CAPS capsule Take 1 capsule (0.4 mg total) by mouth daily. Qty: 30 capsule, Refills: 0    warfarin (COUMADIN) 5 MG tablet TAKE AS DIRECTED BY COUMADIN CLINIC. Qty: 25 tablet, Refills: 3          Discharge Condition: *Stable   Discharge Instructions Get Medicines reviewed and adjusted: Please take all your medications with you for your next visit with your Primary MD  Please request your Primary MD to go over all hospital tests and procedure/radiological results at the follow up, please ask your Primary MD to get all Hospital records sent to his/her office.  If you experience worsening of your admission symptoms, develop shortness of breath, life threatening emergency, suicidal or homicidal thoughts you must seek medical attention immediately by calling 911 or calling your MD immediately if symptoms less severe.  You must read complete instructions/literature along with all the possible adverse reactions/side effects for all the Medicines you take and that have been prescribed to you. Take any new Medicines after you have completely understood and accpet all the possible adverse reactions/side effects.   Do not drive when taking Pain medications.   Do not take more than prescribed Pain, Sleep and Anxiety Medications  Special Instructions: If you have smoked or chewed Tobacco in the last 2  yrs please stop smoking, stop any regular Alcohol and or any Recreational drug use.  Wear Seat belts while driving.  Please note  You were cared for by a hospitalist during your hospital stay. Once you are discharged, your primary care physician will handle any further medical issues. Please note that NO REFILLS for any discharge medications will be authorized once you are discharged, as it is imperative that you return to your primary care physician (or establish a relationship with a primary care physician if you do not have one) for your  aftercare needs so that they can reassess your need for medications and monitor your lab values.     Allergies  Allergen Reactions  . Demerol [Meperidine] Other (See Comments)    Causes hallucinations      Disposition: 03-Skilled Nursing Facility   Consults:  None    Significant Diagnostic Studies:  Dg Chest Port 1 View  Result Date: 09/05/2016 CLINICAL DATA:  83 year old male with a history of coronary artery disease and shortness of breath EXAM: PORTABLE CHEST 1 VIEW COMPARISON:  09/02/2016 FINDINGS: Cardiomediastinal silhouette unchanged. Left heart borders obscured by overlying lung/ pleural disease. Opacity at the left base obscuring the left hemidiaphragm. This has been present on multiple prior studies. New mixed interstitial and airspace opacity of the right lung extending from the hilar region peripherally. No pneumothorax.  No right-sided pleural effusion. Unchanged position of left chest wall cardiac pacing device. Surgical changes of median sternotomy and CABG. No displaced fracture. IMPRESSION: Interval development of mixed interstitial and airspace opacities of the right mid lung extending peripherally. Appearance may represent developing edema and/or infection. Correlation with lab values and presentation may be useful. Persisting opacity at the left base, unchanged from comparison plain film. Unchanged cardiac pacing device. Electronically Signed   By: Corrie Mckusick D.O.   On: 09/05/2016 14:38   Dg Chest Port 1 View  Result Date: 09/02/2016 CLINICAL DATA:  Weakness. Pt here with possible fever and chills; pt states hx of sepsis and worried is the same. Pt denies chest pain. Hx of diabetes and CAD. EXAM: PORTABLE CHEST 1 VIEW COMPARISON:  12/30/2014 FINDINGS: Changes from cardiac surgery and valve replacement are stable. The cardiac silhouette is enlarged. No mediastinal or hilar masses. There is persistent opacity at the left lung base obscuring the left heart border and  left hemidiaphragm, consistent with a moderate effusion with associated atelectasis. Remainder of the lungs is clear. No right pleural effusion. No pneumothorax. Single lead left anterior chest wall pacemaker is stable. Skeletal structures are demineralized but grossly intact. IMPRESSION: 1. No acute cardiopulmonary disease. 2. Persistent left pleural effusion with associated lung base opacity. The lung base opacity is most likely atelectasis. Pneumonia is possible but felt less likely. Electronically Signed   By: Lajean Manes M.D.   On: 09/02/2016 16:22        Filed Weights   09/02/16 2240  Weight: 95.6 kg (210 lb 12.2 oz)     Microbiology: Recent Results (from the past 240 hour(s))  Culture, blood (Routine x 2)     Status: None (Preliminary result)   Collection Time: 09/02/16  3:53 PM  Result Value Ref Range Status   Specimen Description BLOOD LEFT ANTECUBITAL  Final   Special Requests   Final    BOTTLES DRAWN AEROBIC AND ANAEROBIC Blood Culture adequate volume   Culture NO GROWTH 3 DAYS  Final   Report Status PENDING  Incomplete  Culture, blood (Routine x 2)  Status: None (Preliminary result)   Collection Time: 09/02/16  4:57 PM  Result Value Ref Range Status   Specimen Description BLOOD LEFT ANTECUBITAL  Final   Special Requests   Final    BOTTLES DRAWN AEROBIC AND ANAEROBIC Blood Culture adequate volume   Culture NO GROWTH 3 DAYS  Final   Report Status PENDING  Incomplete  MRSA PCR Screening     Status: None   Collection Time: 09/03/16  5:19 AM  Result Value Ref Range Status   MRSA by PCR NEGATIVE NEGATIVE Final    Comment:        The GeneXpert MRSA Assay (FDA approved for NASAL specimens only), is one component of a comprehensive MRSA colonization surveillance program. It is not intended to diagnose MRSA infection nor to guide or monitor treatment for MRSA infections.        Blood Culture    Component Value Date/Time   SDES BLOOD LEFT ANTECUBITAL  09/02/2016 1657   SPECREQUEST  09/02/2016 1657    BOTTLES DRAWN AEROBIC AND ANAEROBIC Blood Culture adequate volume   CULT NO GROWTH 3 DAYS 09/02/2016 1657   REPTSTATUS PENDING 09/02/2016 1657      Labs: Results for orders placed or performed during the hospital encounter of 09/02/16 (from the past 48 hour(s))  Glucose, capillary     Status: Abnormal   Collection Time: 09/04/16 11:34 AM  Result Value Ref Range   Glucose-Capillary 107 (H) 65 - 99 mg/dL   Comment 1 Notify RN    Comment 2 Document in Chart   Glucose, capillary     Status: Abnormal   Collection Time: 09/04/16  5:18 PM  Result Value Ref Range   Glucose-Capillary 177 (H) 65 - 99 mg/dL   Comment 1 Notify RN    Comment 2 Document in Chart   Glucose, capillary     Status: Abnormal   Collection Time: 09/04/16  8:59 PM  Result Value Ref Range   Glucose-Capillary 179 (H) 65 - 99 mg/dL  Protime-INR     Status: Abnormal   Collection Time: 09/05/16  4:22 AM  Result Value Ref Range   Prothrombin Time 34.4 (H) 11.4 - 15.2 seconds   INR 3.31   CBC     Status: Abnormal   Collection Time: 09/05/16  4:22 AM  Result Value Ref Range   WBC 6.8 4.0 - 10.5 K/uL   RBC 3.85 (L) 4.22 - 5.81 MIL/uL   Hemoglobin 11.9 (L) 13.0 - 17.0 g/dL   HCT 38.4 (L) 39.0 - 52.0 %   MCV 99.7 78.0 - 100.0 fL   MCH 30.9 26.0 - 34.0 pg   MCHC 31.0 30.0 - 36.0 g/dL   RDW 14.3 11.5 - 15.5 %   Platelets 142 (L) 150 - 400 K/uL  Comprehensive metabolic panel     Status: Abnormal   Collection Time: 09/05/16  4:22 AM  Result Value Ref Range   Sodium 142 135 - 145 mmol/L   Potassium 4.1 3.5 - 5.1 mmol/L    Comment: DELTA CHECK NOTED   Chloride 111 101 - 111 mmol/L   CO2 26 22 - 32 mmol/L   Glucose, Bld 127 (H) 65 - 99 mg/dL   BUN 18 6 - 20 mg/dL   Creatinine, Ser 1.63 (H) 0.61 - 1.24 mg/dL   Calcium 8.3 (L) 8.9 - 10.3 mg/dL   Total Protein 5.8 (L) 6.5 - 8.1 g/dL   Albumin 2.7 (L) 3.5 - 5.0 g/dL   AST 24 15 -  41 U/L   ALT 15 (L) 17 - 63 U/L    Alkaline Phosphatase 34 (L) 38 - 126 U/L   Total Bilirubin 1.1 0.3 - 1.2 mg/dL   GFR calc non Af Amer 36 (L) >60 mL/min   GFR calc Af Amer 42 (L) >60 mL/min    Comment: (NOTE) The eGFR has been calculated using the CKD EPI equation. This calculation has not been validated in all clinical situations. eGFR's persistently <60 mL/min signify possible Chronic Kidney Disease.    Anion gap 5 5 - 15  Glucose, capillary     Status: Abnormal   Collection Time: 09/05/16  8:32 AM  Result Value Ref Range   Glucose-Capillary 100 (H) 65 - 99 mg/dL   Comment 1 Notify RN    Comment 2 Document in Chart   Glucose, capillary     Status: Abnormal   Collection Time: 09/05/16 11:36 AM  Result Value Ref Range   Glucose-Capillary 117 (H) 65 - 99 mg/dL   Comment 1 Notify RN    Comment 2 Document in Chart   Glucose, capillary     Status: Abnormal   Collection Time: 09/05/16  3:44 PM  Result Value Ref Range   Glucose-Capillary 160 (H) 65 - 99 mg/dL   Comment 1 Notify RN    Comment 2 Document in Chart   Urinalysis, Routine w reflex microscopic     Status: Abnormal   Collection Time: 09/05/16  8:58 PM  Result Value Ref Range   Color, Urine YELLOW YELLOW   APPearance HAZY (A) CLEAR   Specific Gravity, Urine 1.026 1.005 - 1.030   pH 5.0 5.0 - 8.0   Glucose, UA >=500 (A) NEGATIVE mg/dL   Hgb urine dipstick SMALL (A) NEGATIVE   Bilirubin Urine NEGATIVE NEGATIVE   Ketones, ur 20 (A) NEGATIVE mg/dL   Protein, ur 30 (A) NEGATIVE mg/dL   Nitrite NEGATIVE NEGATIVE   Leukocytes, UA NEGATIVE NEGATIVE   RBC / HPF 0-5 0 - 5 RBC/hpf   WBC, UA 0-5 0 - 5 WBC/hpf   Bacteria, UA NONE SEEN NONE SEEN   Squamous Epithelial / LPF 0-5 (A) NONE SEEN   Mucous PRESENT    Uric Acid Crys, UA PRESENT   Glucose, capillary     Status: Abnormal   Collection Time: 09/05/16  9:08 PM  Result Value Ref Range   Glucose-Capillary 174 (H) 65 - 99 mg/dL  Protime-INR     Status: Abnormal   Collection Time: 09/06/16  2:38 AM  Result  Value Ref Range   Prothrombin Time 32.0 (H) 11.4 - 15.2 seconds   INR 3.03   Comprehensive metabolic panel     Status: Abnormal   Collection Time: 09/06/16  2:38 AM  Result Value Ref Range   Sodium 141 135 - 145 mmol/L   Potassium 3.4 (L) 3.5 - 5.1 mmol/L    Comment: DELTA CHECK NOTED   Chloride 111 101 - 111 mmol/L   CO2 22 22 - 32 mmol/L   Glucose, Bld 144 (H) 65 - 99 mg/dL   BUN 18 6 - 20 mg/dL   Creatinine, Ser 1.48 (H) 0.61 - 1.24 mg/dL   Calcium 8.2 (L) 8.9 - 10.3 mg/dL   Total Protein 5.9 (L) 6.5 - 8.1 g/dL   Albumin 2.7 (L) 3.5 - 5.0 g/dL   AST 22 15 - 41 U/L   ALT 14 (L) 17 - 63 U/L   Alkaline Phosphatase 35 (L) 38 - 126 U/L  Total Bilirubin 1.0 0.3 - 1.2 mg/dL   GFR calc non Af Amer 40 (L) >60 mL/min   GFR calc Af Amer 47 (L) >60 mL/min    Comment: (NOTE) The eGFR has been calculated using the CKD EPI equation. This calculation has not been validated in all clinical situations. eGFR's persistently <60 mL/min signify possible Chronic Kidney Disease.    Anion gap 8 5 - 15  Glucose, capillary     Status: None   Collection Time: 09/06/16  9:20 AM  Result Value Ref Range   Glucose-Capillary 95 65 - 99 mg/dL     Lipid Panel  No results found for: CHOL, TRIG, HDL, CHOLHDL, VLDL, LDLCALC, LDLDIRECT   Lab Results  Component Value Date   HGBA1C 7.7 (H) 09/03/2016   HGBA1C 7.5 (H) 12/30/2014   HGBA1C 6.9 (H) 10/02/2013      HPI :*  83 y.o. malewith medical history significant for but not limited to PAFIB, CAD, DM 2, Aortic Stenosis status post AVR, Symptomatic Bradycardia status post pacemaker,presenting with fever and chills associated with generalised weakness associated with hypotension and tachypnea. Patient admitted for  SIRS - ? Source being associated R LE cellulitis .Family states that they took off ticks off his arm about 2 weeks ago.  HOSPITAL COURSE:     #1 Sepsis ruled out /SIRS: Source- possibly R LE Cellulitis, probable LLL pna  Initially  placed on vancomycin and Zosyn on 7/6-transitioned to Zosyn and doxycycline and vancomycin discontinued on 7/9 We'll continue oral doxycycline for another 7 days while  Ehrlichia antibody panel is pending Antibiotic coverage - as above  blood and urine cultures ngsf Repeat CXR  7/9 shows interstitial airspace opacities, likely slight pulmonary edema but no pneumonia   #2 Right Lower Extremity Cellulitis: Antibiotics narrowed to doxy , Zosyn and vancomycin discontinued History of tick exposure-lyme titer negative /continue doxycycline coverage for now  #3 Acute on Chronic Kidney Disease stage III: Due to diagnosis #1 with some element of dehydration Creatinine 1.5 on 01/26/2015 Admitting creatinine of 2.01>1.63>1.48 Lasix and ACE inhibitor held initially Resume low-dose Lasix due to slight pulmonary edema seen on chest x-ray Resume ACE inhibitor if renal function stable over the next 10-14 days  #4 DM2: Resume home regimen  #5 AFib: CHA2DS2vacs score 5 INR elevated ,slowly improving , goal for AVR and a fib 2-3     Discharge Exam:   Blood pressure (!) 153/84, pulse (!) 59, temperature 97.6 F (36.4 C), temperature source Oral, resp. rate (!) 25, height 6' (1.829 m), weight 95.6 kg (210 lb 12.2 oz), SpO2 100 %. Cardiovascular system: S1 & S2 heard, RRR. No JVD, murmurs, rubs, gallops or clicks. No pedal edema. Gastrointestinal system: Abdomen is nondistended, soft and nontender. No organomegaly or masses felt. Normal bowel sounds heard. Central nervous system: Alert and oriented. No focal neurological deficits. Extremities:  no cyanosis, right LE  erythema has improved Skin:  erythematous patch involving right anteromedial thigh, warm to touch, and slightly tender to palpation. Not fluctuant. sychiatry: Judgement and insight appear normal. Mood & affect appropriate.       Follow-up Information    Wenda Low, MD. Call.   Specialty:  Internal Medicine Why:   Hospital follow-up in 3-5 days Contact information: [address] [place] [address] [phone]           Signed: Reyne Dumas 09/06/2016, 9:30 AM        Time spent >1 hour

## 2016-09-06 NOTE — Clinical Social Work Note (Signed)
Patient has decided to return home rather than go to SNF. He states his wife will be there 24/7. Patient has orders to discharge home today.  CSW signing off.  Dayton Scrape, [place]

## 2016-09-07 LAB — CULTURE, BLOOD (ROUTINE X 2)
Culture: NO GROWTH
Culture: NO GROWTH
SPECIAL REQUESTS: ADEQUATE
Special Requests: ADEQUATE

## 2016-09-08 DIAGNOSIS — L820 Inflamed seborrheic keratosis: Secondary | ICD-10-CM | POA: Diagnosis not present

## 2016-09-08 DIAGNOSIS — D485 Neoplasm of uncertain behavior of skin: Secondary | ICD-10-CM | POA: Diagnosis not present

## 2016-09-08 DIAGNOSIS — Z85828 Personal history of other malignant neoplasm of skin: Secondary | ICD-10-CM | POA: Diagnosis not present

## 2016-09-08 DIAGNOSIS — L570 Actinic keratosis: Secondary | ICD-10-CM | POA: Diagnosis not present

## 2016-09-08 DIAGNOSIS — B079 Viral wart, unspecified: Secondary | ICD-10-CM | POA: Diagnosis not present

## 2016-09-08 DIAGNOSIS — C44622 Squamous cell carcinoma of skin of right upper limb, including shoulder: Secondary | ICD-10-CM | POA: Diagnosis not present

## 2016-09-08 DIAGNOSIS — D225 Melanocytic nevi of trunk: Secondary | ICD-10-CM | POA: Diagnosis not present

## 2016-09-08 LAB — EHRLICHIA ANTIBODY PANEL
E chaffeensis (HGE) Ab, IgG: NEGATIVE
E chaffeensis (HGE) Ab, IgM: NEGATIVE
E. CHAFFEENSIS (HME) IGM TITER: NEGATIVE
E. CHAFFEENSIS IGG AB: NEGATIVE

## 2016-09-12 DIAGNOSIS — L03115 Cellulitis of right lower limb: Secondary | ICD-10-CM | POA: Diagnosis not present

## 2016-09-22 ENCOUNTER — Ambulatory Visit (INDEPENDENT_AMBULATORY_CARE_PROVIDER_SITE_OTHER): Payer: Medicare Other | Admitting: *Deleted

## 2016-09-22 DIAGNOSIS — I2510 Atherosclerotic heart disease of native coronary artery without angina pectoris: Secondary | ICD-10-CM | POA: Diagnosis not present

## 2016-09-22 DIAGNOSIS — I4891 Unspecified atrial fibrillation: Secondary | ICD-10-CM

## 2016-09-22 DIAGNOSIS — Z5181 Encounter for therapeutic drug level monitoring: Secondary | ICD-10-CM

## 2016-09-22 LAB — POCT INR: INR: 4

## 2016-09-26 DIAGNOSIS — H2512 Age-related nuclear cataract, left eye: Secondary | ICD-10-CM | POA: Diagnosis not present

## 2016-09-26 DIAGNOSIS — H25042 Posterior subcapsular polar age-related cataract, left eye: Secondary | ICD-10-CM | POA: Diagnosis not present

## 2016-09-26 DIAGNOSIS — H25012 Cortical age-related cataract, left eye: Secondary | ICD-10-CM | POA: Diagnosis not present

## 2016-09-27 DIAGNOSIS — H2511 Age-related nuclear cataract, right eye: Secondary | ICD-10-CM | POA: Diagnosis not present

## 2016-10-03 ENCOUNTER — Ambulatory Visit (INDEPENDENT_AMBULATORY_CARE_PROVIDER_SITE_OTHER): Payer: Medicare Other | Admitting: *Deleted

## 2016-10-03 DIAGNOSIS — I4891 Unspecified atrial fibrillation: Secondary | ICD-10-CM | POA: Diagnosis not present

## 2016-10-03 DIAGNOSIS — I2510 Atherosclerotic heart disease of native coronary artery without angina pectoris: Secondary | ICD-10-CM

## 2016-10-03 DIAGNOSIS — Z5181 Encounter for therapeutic drug level monitoring: Secondary | ICD-10-CM | POA: Diagnosis not present

## 2016-10-03 LAB — PROTIME-INR
INR: 4.5 — AB (ref 0.8–1.2)
PROTHROMBIN TIME: 48.2 s — AB (ref 9.1–12.0)

## 2016-10-03 LAB — POCT INR: INR: 6.1

## 2016-10-05 DIAGNOSIS — Z85828 Personal history of other malignant neoplasm of skin: Secondary | ICD-10-CM | POA: Diagnosis not present

## 2016-10-05 DIAGNOSIS — L905 Scar conditions and fibrosis of skin: Secondary | ICD-10-CM | POA: Diagnosis not present

## 2016-10-05 DIAGNOSIS — L570 Actinic keratosis: Secondary | ICD-10-CM | POA: Diagnosis not present

## 2016-10-06 DIAGNOSIS — K521 Toxic gastroenteritis and colitis: Secondary | ICD-10-CM | POA: Diagnosis not present

## 2016-10-06 DIAGNOSIS — Z7901 Long term (current) use of anticoagulants: Secondary | ICD-10-CM | POA: Diagnosis not present

## 2016-10-10 ENCOUNTER — Ambulatory Visit: Payer: Medicare Other | Admitting: Podiatry

## 2016-10-12 DIAGNOSIS — I959 Hypotension, unspecified: Secondary | ICD-10-CM | POA: Diagnosis not present

## 2016-10-12 DIAGNOSIS — R51 Headache: Secondary | ICD-10-CM | POA: Diagnosis not present

## 2016-10-12 DIAGNOSIS — R197 Diarrhea, unspecified: Secondary | ICD-10-CM | POA: Diagnosis not present

## 2016-10-13 ENCOUNTER — Other Ambulatory Visit: Payer: Self-pay | Admitting: Interventional Cardiology

## 2016-10-14 ENCOUNTER — Other Ambulatory Visit: Payer: Self-pay | Admitting: Internal Medicine

## 2016-10-14 ENCOUNTER — Ambulatory Visit
Admission: RE | Admit: 2016-10-14 | Discharge: 2016-10-14 | Disposition: A | Discharge status: Home / Self Care | Payer: Medicare Other | Source: Ambulatory Visit | Attending: Internal Medicine | Admitting: Internal Medicine

## 2016-10-14 DIAGNOSIS — R519 Headache, unspecified: Secondary | ICD-10-CM

## 2016-10-14 DIAGNOSIS — R55 Syncope and collapse: Secondary | ICD-10-CM | POA: Diagnosis not present

## 2016-10-14 DIAGNOSIS — R51 Headache: Principal | ICD-10-CM

## 2016-10-14 DIAGNOSIS — R42 Dizziness and giddiness: Secondary | ICD-10-CM | POA: Diagnosis not present

## 2016-10-20 DIAGNOSIS — K521 Toxic gastroenteritis and colitis: Secondary | ICD-10-CM | POA: Diagnosis not present

## 2016-10-21 ENCOUNTER — Encounter: Payer: Self-pay | Admitting: Pharmacist

## 2016-11-10 ENCOUNTER — Encounter: Payer: Self-pay | Admitting: Podiatry

## 2016-11-10 ENCOUNTER — Ambulatory Visit (INDEPENDENT_AMBULATORY_CARE_PROVIDER_SITE_OTHER): Payer: Medicare Other | Admitting: Podiatry

## 2016-11-10 DIAGNOSIS — E1142 Type 2 diabetes mellitus with diabetic polyneuropathy: Secondary | ICD-10-CM

## 2016-11-10 DIAGNOSIS — B351 Tinea unguium: Secondary | ICD-10-CM | POA: Diagnosis not present

## 2016-11-10 DIAGNOSIS — M79609 Pain in unspecified limb: Secondary | ICD-10-CM | POA: Diagnosis not present

## 2016-11-10 NOTE — Progress Notes (Signed)
Complaint:  Visit Type: Patient returns to my office for continued preventative foot care services. Complaint: Patient states" my nails have grown long and thick and become painful to walk and wear shoes" Patient has been diagnosed with DM with no foot neuropathy.. The patient presents for preventative foot care services. No changes to ROS  Podiatric Exam: Vascular: dorsalis pedis and posterior tibial pulses are not  palpable bilatera due to severe swelling both feet.. Capillary return is immediate. Temperature gradient is WNL. Skin turgor WNL  Sensorium: Diminished  Semmes Weinstein monofilament test. Normal tactile sensation bilaterally. Nail Exam: Pt has thick disfigured discolored nails with subungual debris noted bilateral entire nail hallux through fifth toenails Ulcer Exam: There is no evidence of ulcer or pre-ulcerative changes or infection. Orthopedic Exam: Muscle tone and strength are WNL. No limitations in general ROM. No crepitus or effusions noted. Foot type and digits show no abnormalities. Bony prominences are unremarkable. Skin: No Porokeratosis. No infection or ulcers  Diagnosis:  Onychomycosis, , Pain in right toe, pain in left toes  Treatment & Plan Procedures and Treatment: Consent by patient was obtained for treatment procedures. The patient understood the discussion of treatment and procedures well. All questions were answered thoroughly reviewed. Debridement of mycotic and hypertrophic toenails, 1 through 5 bilateral and clearing of subungual debris. No ulceration, no infection noted.  Return Visit-Office Procedure: Patient instructed to return to the office for a follow up visit 3 months for continued evaluation and treatment.    Gardiner Barefoot DPM

## 2016-11-24 ENCOUNTER — Ambulatory Visit (INDEPENDENT_AMBULATORY_CARE_PROVIDER_SITE_OTHER): Payer: Medicare Other | Admitting: *Deleted

## 2016-11-24 DIAGNOSIS — Z952 Presence of prosthetic heart valve: Secondary | ICD-10-CM | POA: Diagnosis not present

## 2016-11-24 DIAGNOSIS — Z5181 Encounter for therapeutic drug level monitoring: Secondary | ICD-10-CM | POA: Diagnosis not present

## 2016-11-24 DIAGNOSIS — I4891 Unspecified atrial fibrillation: Secondary | ICD-10-CM

## 2016-11-24 LAB — PROTIME-INR
INR: 9 — AB (ref 0.8–1.2)
Prothrombin Time: 95.1 s — ABNORMAL HIGH (ref 9.1–12.0)

## 2016-11-24 LAB — POCT INR: INR: 8

## 2016-11-28 ENCOUNTER — Ambulatory Visit (INDEPENDENT_AMBULATORY_CARE_PROVIDER_SITE_OTHER): Payer: Medicare Other | Admitting: *Deleted

## 2016-11-28 DIAGNOSIS — I4891 Unspecified atrial fibrillation: Secondary | ICD-10-CM | POA: Diagnosis not present

## 2016-11-28 DIAGNOSIS — Z5181 Encounter for therapeutic drug level monitoring: Secondary | ICD-10-CM

## 2016-11-28 LAB — POCT INR: INR: 2.9

## 2016-11-28 MED ORDER — WARFARIN SODIUM 2.5 MG PO TABS: 2.5000 mg | ORAL_TABLET | Freq: Every day | ORAL | 3 refills | Status: DC

## 2016-12-01 ENCOUNTER — Other Ambulatory Visit: Payer: Self-pay | Admitting: Internal Medicine

## 2016-12-01 DIAGNOSIS — Z1389 Encounter for screening for other disorder: Secondary | ICD-10-CM | POA: Diagnosis not present

## 2016-12-01 DIAGNOSIS — Z7984 Long term (current) use of oral hypoglycemic drugs: Secondary | ICD-10-CM | POA: Diagnosis not present

## 2016-12-01 DIAGNOSIS — M48061 Spinal stenosis, lumbar region without neurogenic claudication: Secondary | ICD-10-CM | POA: Diagnosis not present

## 2016-12-01 DIAGNOSIS — Z0000 Encounter for general adult medical examination without abnormal findings: Secondary | ICD-10-CM | POA: Diagnosis not present

## 2016-12-01 DIAGNOSIS — E7800 Pure hypercholesterolemia, unspecified: Secondary | ICD-10-CM | POA: Diagnosis not present

## 2016-12-01 DIAGNOSIS — Z952 Presence of prosthetic heart valve: Secondary | ICD-10-CM | POA: Diagnosis not present

## 2016-12-01 DIAGNOSIS — N183 Chronic kidney disease, stage 3 (moderate): Secondary | ICD-10-CM | POA: Diagnosis not present

## 2016-12-01 DIAGNOSIS — R609 Edema, unspecified: Secondary | ICD-10-CM

## 2016-12-01 DIAGNOSIS — E1122 Type 2 diabetes mellitus with diabetic chronic kidney disease: Secondary | ICD-10-CM | POA: Diagnosis not present

## 2016-12-01 DIAGNOSIS — I10 Essential (primary) hypertension: Secondary | ICD-10-CM | POA: Diagnosis not present

## 2016-12-01 DIAGNOSIS — E1165 Type 2 diabetes mellitus with hyperglycemia: Secondary | ICD-10-CM | POA: Diagnosis not present

## 2016-12-01 DIAGNOSIS — I25810 Atherosclerosis of coronary artery bypass graft(s) without angina pectoris: Secondary | ICD-10-CM

## 2016-12-01 DIAGNOSIS — Z23 Encounter for immunization: Secondary | ICD-10-CM | POA: Diagnosis not present

## 2016-12-01 DIAGNOSIS — N400 Enlarged prostate without lower urinary tract symptoms: Secondary | ICD-10-CM | POA: Diagnosis not present

## 2016-12-01 DIAGNOSIS — I482 Chronic atrial fibrillation: Secondary | ICD-10-CM | POA: Diagnosis not present

## 2016-12-05 ENCOUNTER — Ambulatory Visit (INDEPENDENT_AMBULATORY_CARE_PROVIDER_SITE_OTHER): Payer: Medicare Other | Admitting: *Deleted

## 2016-12-05 DIAGNOSIS — I4891 Unspecified atrial fibrillation: Secondary | ICD-10-CM | POA: Diagnosis not present

## 2016-12-05 DIAGNOSIS — Z5181 Encounter for therapeutic drug level monitoring: Secondary | ICD-10-CM

## 2016-12-05 LAB — POCT INR: INR: 1.5

## 2016-12-07 ENCOUNTER — Ambulatory Visit (HOSPITAL_COMMUNITY): Payer: Medicare Other | Attending: Cardiology

## 2016-12-07 ENCOUNTER — Other Ambulatory Visit: Payer: Self-pay

## 2016-12-07 DIAGNOSIS — I25810 Atherosclerosis of coronary artery bypass graft(s) without angina pectoris: Secondary | ICD-10-CM | POA: Diagnosis not present

## 2016-12-07 DIAGNOSIS — Z953 Presence of xenogenic heart valve: Secondary | ICD-10-CM | POA: Diagnosis not present

## 2016-12-07 DIAGNOSIS — E1122 Type 2 diabetes mellitus with diabetic chronic kidney disease: Secondary | ICD-10-CM | POA: Diagnosis not present

## 2016-12-07 DIAGNOSIS — I4891 Unspecified atrial fibrillation: Secondary | ICD-10-CM | POA: Insufficient documentation

## 2016-12-07 DIAGNOSIS — I081 Rheumatic disorders of both mitral and tricuspid valves: Secondary | ICD-10-CM | POA: Diagnosis not present

## 2016-12-07 DIAGNOSIS — N183 Chronic kidney disease, stage 3 (moderate): Secondary | ICD-10-CM | POA: Insufficient documentation

## 2016-12-07 DIAGNOSIS — Z950 Presence of cardiac pacemaker: Secondary | ICD-10-CM | POA: Diagnosis not present

## 2016-12-07 DIAGNOSIS — I129 Hypertensive chronic kidney disease with stage 1 through stage 4 chronic kidney disease, or unspecified chronic kidney disease: Secondary | ICD-10-CM | POA: Insufficient documentation

## 2016-12-07 DIAGNOSIS — R609 Edema, unspecified: Secondary | ICD-10-CM | POA: Diagnosis not present

## 2016-12-07 DIAGNOSIS — I959 Hypotension, unspecified: Secondary | ICD-10-CM | POA: Insufficient documentation

## 2016-12-07 MED ORDER — PERFLUTREN LIPID MICROSPHERE
1.0000 mL | INTRAVENOUS | Status: AC | PRN
  Administered 2016-12-07: 2 mL via INTRAVENOUS

## 2016-12-12 ENCOUNTER — Encounter: Payer: Self-pay | Admitting: Physician Assistant

## 2016-12-12 ENCOUNTER — Ambulatory Visit (INDEPENDENT_AMBULATORY_CARE_PROVIDER_SITE_OTHER): Payer: Medicare Other | Admitting: Physician Assistant

## 2016-12-12 VITALS — BP 116/72 | HR 88 | Resp 16 | Ht 68.0 in | Wt 220.0 lb

## 2016-12-12 DIAGNOSIS — I4891 Unspecified atrial fibrillation: Secondary | ICD-10-CM

## 2016-12-12 DIAGNOSIS — Z950 Presence of cardiac pacemaker: Secondary | ICD-10-CM

## 2016-12-12 DIAGNOSIS — I10 Essential (primary) hypertension: Secondary | ICD-10-CM | POA: Diagnosis not present

## 2016-12-12 DIAGNOSIS — Z952 Presence of prosthetic heart valve: Secondary | ICD-10-CM

## 2016-12-12 DIAGNOSIS — I2510 Atherosclerotic heart disease of native coronary artery without angina pectoris: Secondary | ICD-10-CM

## 2016-12-12 DIAGNOSIS — N183 Chronic kidney disease, stage 3 unspecified: Secondary | ICD-10-CM

## 2016-12-12 DIAGNOSIS — I25810 Atherosclerosis of coronary artery bypass graft(s) without angina pectoris: Secondary | ICD-10-CM

## 2016-12-12 DIAGNOSIS — I5033 Acute on chronic diastolic (congestive) heart failure: Secondary | ICD-10-CM | POA: Diagnosis not present

## 2016-12-12 LAB — BASIC METABOLIC PANEL
BUN/Creatinine Ratio: 9 — ABNORMAL LOW (ref 10–24)
BUN: 13 mg/dL (ref 8–27)
CALCIUM: 9.1 mg/dL (ref 8.6–10.2)
CO2: 29 mmol/L (ref 20–29)
CREATININE: 1.38 mg/dL — AB (ref 0.76–1.27)
Chloride: 101 mmol/L (ref 96–106)
GFR calc non Af Amer: 45 mL/min/{1.73_m2} — ABNORMAL LOW (ref >59)
GFR, EST AFRICAN AMERICAN: 52 mL/min/{1.73_m2} — AB (ref >59)
Glucose: 74 mg/dL (ref 65–99)
Potassium: 4.1 mmol/L (ref 3.5–5.2)
Sodium: 144 mmol/L (ref 134–144)

## 2016-12-12 LAB — PRO B NATRIURETIC PEPTIDE: NT-Pro BNP: 1801 pg/mL — ABNORMAL HIGH (ref 0–486)

## 2016-12-12 MED ORDER — TORSEMIDE 20 MG PO TABS: ORAL_TABLET | ORAL | 3 refills | Status: DC

## 2016-12-12 NOTE — Patient Instructions (Signed)
Your physician has recommended you make the following change in your medication:  1.) stop lasix 2.) start Torsemide (Demadex) --take 2 tablets (40 mg) two times a day for 3 days, then 2 tablets (40 mg) daily after that  Your physician recommends that you return for lab work today (BMET, BNP)  Your physician recommends that you schedule a follow-up appointment in: 7-10 days with Selinda Eon.  After 2-3 days if your weight does not decrease and/or you do not start feeling better, please go to [facility] Emergency Department for urgent evaluation  Low-Sodium Eating Plan Sodium, which is an element that makes up salt, helps you maintain a healthy balance of fluids in your body. Too much sodium can increase your blood pressure and cause fluid and waste to be held in your body. Your health care provider or dietitian may recommend following this plan if you have high blood pressure (hypertension), kidney disease, liver disease, or heart failure. Eating less sodium can help lower your blood pressure, reduce swelling, and protect your heart, liver, and kidneys. What are tips for following this plan? General guidelines  Most people on this plan should limit their sodium intake to 1,500-2,000 mg (milligrams) of sodium each day. Reading food labels  The Nutrition Facts label lists the amount of sodium in one serving of the food. If you eat more than one serving, you must multiply the listed amount of sodium by the number of servings.  Choose foods with less than 140 mg of sodium per serving.  Avoid foods with 300 mg of sodium or more per serving. Shopping  Look for lower-sodium products, often labeled as "low-sodium" or "no salt added."  Always check the sodium content even if foods are labeled as "unsalted" or "no salt added".  Buy fresh foods. ? Avoid canned foods and premade or frozen meals. ? Avoid canned, cured, or processed meats  Buy breads that have less than 80 mg of sodium per  slice. Cooking  Eat more home-cooked food and less restaurant, buffet, and fast food.  Avoid adding salt when cooking. Use salt-free seasonings or herbs instead of table salt or sea salt. Check with your health care provider or pharmacist before using salt substitutes.  Cook with plant-based oils, such as canola, sunflower, or olive oil. Meal planning  When eating at a restaurant, ask that your food be prepared with less salt or no salt, if possible.  Avoid foods that contain MSG (monosodium glutamate). MSG is sometimes added to [place] food, bouillon, and some canned foods. What foods are recommended? The items listed may not be a complete list. Talk with your dietitian about what dietary choices are best for you. Grains Low-sodium cereals, including oats, puffed wheat and rice, and shredded wheat. Low-sodium crackers. Unsalted rice. Unsalted pasta. Low-sodium bread. Whole-grain breads and whole-grain pasta. Vegetables Fresh or frozen vegetables. "No salt added" canned vegetables. "No salt added" tomato sauce and paste. Low-sodium or reduced-sodium tomato and vegetable juice. Fruits Fresh, frozen, or canned fruit. Fruit juice. Meats and other protein foods Fresh or frozen (no salt added) meat, poultry, seafood, and fish. Low-sodium canned tuna and salmon. Unsalted nuts. Dried peas, beans, and lentils without added salt. Unsalted canned beans. Eggs. Unsalted nut butters. Dairy Milk. Soy milk. Cheese that is naturally low in sodium, such as ricotta cheese, fresh mozzarella, or Swiss cheese Low-sodium or reduced-sodium cheese. Cream cheese. Yogurt. Fats and oils Unsalted butter. Unsalted margarine with no trans fat. Vegetable oils such as canola or olive oils.  Seasonings and other foods Fresh and dried herbs and spices. Salt-free seasonings. Low-sodium mustard and ketchup. Sodium-free salad dressing. Sodium-free light mayonnaise. Fresh or refrigerated horseradish. Lemon juice. Vinegar.  Homemade, reduced-sodium, or low-sodium soups. Unsalted popcorn and pretzels. Low-salt or salt-free chips. What foods are not recommended? The items listed may not be a complete list. Talk with your dietitian about what dietary choices are best for you. Grains Instant hot cereals. Bread stuffing, pancake, and biscuit mixes. Croutons. Seasoned rice or pasta mixes. Noodle soup cups. Boxed or frozen macaroni and cheese. Regular salted crackers. Self-rising flour. Vegetables Sauerkraut, pickled vegetables, and relishes. Olives. [place] fries. Onion rings. Regular canned vegetables (not low-sodium or reduced-sodium). Regular canned tomato sauce and paste (not low-sodium or reduced-sodium). Regular tomato and vegetable juice (not low-sodium or reduced-sodium). Frozen vegetables in sauces. Meats and other protein foods Meat or fish that is salted, canned, smoked, spiced, or pickled. Bacon, ham, sausage, hotdogs, corned beef, chipped beef, packaged lunch meats, salt pork, jerky, pickled herring, anchovies, regular canned tuna, sardines, salted nuts. Dairy Processed cheese and cheese spreads. Cheese curds. Blue cheese. Feta cheese. String cheese. Regular cottage cheese. Buttermilk. Canned milk. Fats and oils Salted butter. Regular margarine. Ghee. Bacon fat. Seasonings and other foods Onion salt, garlic salt, seasoned salt, table salt, and sea salt. Canned and packaged gravies. Worcestershire sauce. Tartar sauce. Barbecue sauce. Teriyaki sauce. Soy sauce, including reduced-sodium. Steak sauce. Fish sauce. Oyster sauce. Cocktail sauce. Horseradish that you find on the shelf. Regular ketchup and mustard. Meat flavorings and tenderizers. Bouillon cubes. Hot sauce and Tabasco sauce. Premade or packaged marinades. Premade or packaged taco seasonings. Relishes. Regular salad dressings. Salsa. Potato and tortilla chips. Corn chips and puffs. Salted popcorn and pretzels. Canned or dried soups. Pizza. Frozen entrees and  pot pies. Summary  Eating less sodium can help lower your blood pressure, reduce swelling, and protect your heart, liver, and kidneys.  Most people on this plan should limit their sodium intake to 1,500-2,000 mg (milligrams) of sodium each day.  Canned, boxed, and frozen foods are high in sodium. Restaurant foods, fast foods, and pizza are also very high in sodium. You also get sodium by adding salt to food.  Try to cook at home, eat more fresh fruits and vegetables, and eat less fast food, canned, processed, or prepared foods. This information is not intended to replace advice given to you by your health care provider. Make sure you discuss any questions you have with your health care provider. Document Released: 08/06/2001 Document Revised: 02/08/2016 Document Reviewed: 02/08/2016 Elsevier Interactive Patient Education  2017 [employer].

## 2016-12-12 NOTE — Progress Notes (Signed)
Cardiology Office Note    Date:  12/12/2016   ID:  [name], DOB 08-07-1925, MRN [id number]  PCP:  Wenda Low, MD  Cardiologist: Dr. Irish Lack EPS: Dr. Rayann Heman  Chief Complaint  Patient presents with  . Edema    History of Present Illness:  [name] is a 83 y.o. male with history of CAD status post CABG 1 in aVR in 2006. He has chronic atrial fibrillation CHADSVASC=5 on Coumadin and bradycardia treated with pacemaker in 2014, hypertension, DM type II. Last saw Dr. Irish Lack 04/2016 and doing well, Dr. Rayann Heman 05/2016 and pacer working well.  Patient was hospitalized 08/2016 with sepsis question secondary to cellulitis. Also had acute on chronic CK D stage III creatinine on admission 2.01 was 1.48 and had slight pulmonary edema on chest x-ray. ACE inhibitor was held in the hospital but recommending resuming in 10-14 days.  Patient saw primary care the past couple weeks for fluid overload. Lasix was increased from 20 mg to 60 mg daily without improvement. RAM a pro was stopped. Labs from 12/01/16 reviewed and creatinine was 1.29 hemoglobin stable lipid profile LDL not at goal at 114, INR 1.5. He ordered a2-D echo 12/07/16 showed normal LVEF 60-65% with high ventricular filling pressures, normal functioning aortic valve, moderate mitral stenosis and mild MR, mildly dilated left atrium.  Patient comes in accompanied by his wife. He is complaining of swelling left arm with weeping in both lower extremities left greater than right. He has some dyspnea on exertion but no orthopnea. Denies any chest pain. This is been going on for couple weeks. He eats out twice a day at a country store in  [place].       Past Medical History:  Diagnosis Date  . Aortic stenosis   . BPH (benign prostatic hypertrophy)   . Coronary artery disease   . Dermatophytosis of nail   . Diabetes mellitus without complication ([place])    type 2  . Diverticulosis   . DVT (deep venous thrombosis) ([place])   .  Gastroesophageal reflux disease   . GERD (gastroesophageal reflux disease)   . Gout   . 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 Kidney stones   . PAH (pulmonary artery hypertension) ([place])    PAS 63 on ECHO 09/2012  . Permanent atrial fibrillation ([place])   . PVD (peripheral vascular disease) ([place])   . Shortness of breath   . 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 AORTIC VALVE REPLACEMENT  2006  . BALLOON DILATION N/A 12/16/2013   Procedure: BALLOON DILATION;  Surgeon: Garlan Fair, MD;  Location: [facility] END[facility]COPY;  Service: Endoscopy;  Laterality: N/A;  . CORONARY ARTERY BYPASS GRAFT    . ESOPHAGOGASTRODUODENOSCOPY N/A 12/16/2013   Procedure: ESOPHAGOGASTRODUODENOSCOPY (EGD);  Surgeon: Garlan Fair, MD;  Location: [facility];  Service: Endoscopy;  Laterality: N/A;  . HERNIA REPAIR    . PACEMAKER INSERTION  10-04-2012   Medtronic pacemaker implanted by Dr Rayann Heman for symptomatic bradycardia  . PARAESOPHAGEAL HERNIA REPAIR    . PERMANENT PACEMAKER INSERTION N/A 10/04/2012   Procedure: PERMANENT PACEMAKER INSERTION;  Surgeon: Thompson Grayer, MD;  Location: [facility] CATH LAB;  Service: Cardiovascular;  Laterality: N/A;  . TEE WITHOUT CARDIOVERSION N/A 12/25/2014   Procedure: TRANSESOPHAGEAL ECHOCARDIOGRAM (TEE)   (inpatient at [facility]);  Surgeon: Skeet Latch, MD;  Location: [facility];  Service: Cardiovascular;  Laterality: N/A;    Current Medications: Current Meds  Medication Sig  . acetaminophen (TYLENOL) 325 MG tablet Take 325-650 mg by mouth every 6 (six) hours as needed for mild pain or headache.  . Coenzyme Q10 (CO Q 10 PO) Take 1 capsule by mouth daily.  [name] glimepiride (AMARYL) 1 MG tablet Take 1 mg by mouth 2 (two) times daily.   . nitroGLYCERIN (NITROSTAT) 0.4 MG SL tablet Place 1 tablet (0.4 mg total) under the tongue every 5 (five) minutes x 3 doses as needed for chest pain.  .  tamsulosin (FLOMAX) 0.4 MG CAPS capsule Take 1 capsule (0.4 mg total) by mouth daily.  [name] warfarin (COUMADIN) 2.5 MG tablet Take 1 tablet (2.5 mg total) by mouth daily.  . [DISCONTINUED] furosemide (LASIX) 20 MG tablet Take 1 tablet (20 mg total) by mouth 2 (two) times daily.     Allergies:   Demerol [meperidine]   Social History   Social History  . Marital status: Married    Spouse name: N/A  . Number of children: N/A  . Years of education: N/A   Social History Main Topics  . Smoking status: Never Smoker  . Smokeless tobacco: Never Used  . Alcohol use No  . Drug use: No  . Sexual activity: Not Asked   Other Topics Concern  . None   Social History Narrative  . None     Family History:  The patient's family history includes Cancer - Other in his father and mother.   ROS:   Please see the history of present illness.    Review of Systems  Constitution: Negative.  HENT: Negative.   Cardiovascular: Positive for dyspnea on exertion and leg swelling.  Respiratory: Negative.   Endocrine: Negative.   Hematologic/Lymphatic: Negative.   Musculoskeletal: Positive for arthritis and myalgias.  Gastrointestinal: Positive for diarrhea.  Genitourinary: Negative.   Neurological: Negative.    All other systems reviewed and are negative.   PHYSICAL EXAM:   VS:  BP 116/72   Pulse 88   Resp 16   Ht 5\' 8"  (1.727 m)   Wt 220 lb (99.8 kg)   SpO2 93%   BMI 33.45 kg/m   Physical Exam  GEN: Well nourished, well developed, in no acute distress  Neck: no JVD, carotid bruits, or masses Cardiac:RRR; 1 to 2/6 systolic murmur at the left sternal border and apex Respiratory: Decreased breath sounds at the left lung base with few crackles, clear in the right GI: soft, nontender, nondistended, + BS Ext: +3-4 edema all the way up left greater than right Neuro:  Alert and Oriented x 3 Psych: euthymic mood, full affect  Wt Readings from Last 3 Encounters:  12/12/16 220 lb (99.8 kg)    09/02/16 210 lb 12.2 oz (95.6 kg)  06/21/16 210 lb (95.3 kg)      Studies/Labs Reviewed:   EKG:  EKG is not ordered today.     Recent Labs: 09/03/2016: TSH 5.825 09/04/2016: Magnesium 1.8 09/05/2016: Hemoglobin 11.9; Platelets 142 09/06/2016: ALT 14; BUN 18; Creatinine, Ser 1.48; Potassium 3.4; Sodium 141   Lipid Panel No results found for: CHOL, TRIG, HDL, CHOLHDL, VLDL, LDLCALC, LDLDIRECT  Additional studies/ records that were reviewed today include:  2-D echo 12/07/16  Study Conclusions   - Procedure narrative: Transthoracic echocardiography. Image   quality was suboptimal. The study was technically difficult.   Intravenous contrast (Definity) was administered. - Left ventricle: The cavity size was normal. Systolic function was   normal.  The estimated ejection fraction was in the range of 60%   to 65%. Wall motion was normal; there were no regional wall   motion abnormalities. The study was not technically sufficient to   allow evaluation of LV diastolic dysfunction due to atrial   fibrillation. Doppler parameters are consistent with high   ventricular filling pressure. - Aortic valve: Poorly visualized. A bioprosthesis was present and   functioning normally. Mean gradient (S): 11 mm Hg. Peak gradient   (S): 18 mm Hg. - Mitral valve: Moderate diffuse thickening and calcification of   the anterior leaflet. Mobility was restricted. The findings are   consistent with moderate stenosis. There was mild regurgitation.   Valve area by pressure half-time: 1.29 cm^2. - Left atrium: The atrium was mildly dilated. - Pulmonary arteries: PA peak pressure: 42 mm Hg (S).   DATE OF PROCEDURE:  05/11/2004  DATE OF DISCHARGE:                                  OPERATIVE REPORT    OPERATION:  1.  Coronary bypass grafting x1 (saphenous vein graft to right coronary      artery).  2.  Aortic valve replacement with a 21-mm pericardial tissue valve ([place]wards,      model 2700 serial number  [id number]).      ASSESSMENT:    1. Acute on chronic diastolic CHF (congestive heart failure) ([place])   2. Coronary artery disease involving native coronary artery of native heart without angina pectoris   3. S/P AVR   4. Atrial fibrillation, unspecified type ([place])   5. Essential hypertension, benign   6. CKD (chronic kidney disease) stage 3, GFR 30-59 ml/min (H[facility])   7. Cardiac pacemaker in situ      PLAN:  In order of problems listed above:  Acute on chronic diastolic CHF 2-D echo 93/79/02 normal LV function with high ventricular filling pressures and moderate mitral stenosis. We'll stop Lasix and begin Demadex 40 mg twice a day for 3 days then 40 mg once daily. Check labs today. Weight is up 10 pounds but probably has closer to 20 pounds of fluid. If he does not improve in the next 2-3 days he may need to come to the hospital for IV diuresis. 2 g sodium diet given. I will see him back next week.   CAD status post CABG 1 with an SVG to the RCA and aortic valve replacement with pericardial tissue valve 04/2004-Echo shows well-functioning aortic valve. He does have moderate mitral stenosis. Normal LV function. No angina. Discuss stopping amaryl with primary care.  Atrial fibrillation on Coumadin  Essential hypertension controlled  CKD stage III has seen renal in the past. Primary care stopped his ramipril and creatinine 1.29 12/01/16  Pacemaker for symptomatic bradycardia 2014 pacer functioning normally 05/2016.    Medication Adjustments/Labs and Tests Ordered: Current medicines are reviewed at length with the patient today.  Concerns regarding medicines are outlined above.  Medication changes, Labs and Tests ordered today are listed in the Patient Instructions below. Patient Instructions  Your physician has recommended you make the following change in your medication:  1.) stop lasix 2.) start Torsemide (Demadex) --take 2 tablets (40 mg) two times a day for 3 days, then 2 tablets (40  mg) daily after that  Your physician recommends that you return for lab work today (BMET, BNP)  Your physician recommends that you schedule a  follow-up appointment in: 7-10 days with [facility].  After 2-3 days if your weight does not decrease and/or you do not start feeling better, please go to [facility] Emergency Department for urgent evaluation  Low-Sodium Eating Plan Sodium, which is an element that makes up salt, helps you maintain a healthy balance of fluids in your body. Too much sodium can increase your blood pressure and cause fluid and waste to be held in your body. Your health care provider or dietitian may recommend following this plan if you have high blood pressure (hypertension), kidney disease, liver disease, or heart failure. Eating less sodium can help lower your blood pressure, reduce swelling, and protect your heart, liver, and kidneys. What are tips for following this plan? General guidelines  Most people on this plan should limit their sodium intake to 1,500-2,000 mg (milligrams) of sodium each day. Reading food labels  The Nutrition Facts label lists the amount of sodium in one serving of the food. If you eat more than one serving, you must multiply the listed amount of sodium by the number of servings.  Choose foods with less than 140 mg of sodium per serving.  Avoid foods with 300 mg of sodium or more per serving. Shopping  Look for lower-sodium products, often labeled as "low-sodium" or "no salt added."  Always check the sodium content even if foods are labeled as "unsalted" or "no salt added".  Buy fresh foods. ? Avoid canned foods and premade or frozen meals. ? Avoid canned, cured, or processed meats  Buy breads that have less than 80 mg of sodium per slice. Cooking  Eat more home-cooked food and less restaurant, buffet, and fast food.  Avoid adding salt when cooking. Use salt-free seasonings or herbs instead of table salt or sea salt. Check with your  health care provider or pharmacist before using salt substitutes.  Cook with plant-based oils, such as canola, sunflower, or olive oil. Meal planning  When eating at a restaurant, ask that your food be prepared with less salt or no salt, if possible.  Avoid foods that contain MSG (monosodium glutamate). MSG is sometimes added to [place] food, bouillon, and some canned foods. What foods are recommended? The items listed may not be a complete list. Talk with your dietitian about what dietary choices are best for you. Grains Low-sodium cereals, including oats, puffed wheat and rice, and shredded wheat. Low-sodium crackers. Unsalted rice. Unsalted pasta. Low-sodium bread. Whole-grain breads and whole-grain pasta. Vegetables Fresh or frozen vegetables. "No salt added" canned vegetables. "No salt added" tomato sauce and paste. Low-sodium or reduced-sodium tomato and vegetable juice. Fruits Fresh, frozen, or canned fruit. Fruit juice. Meats and other protein foods Fresh or frozen (no salt added) meat, poultry, seafood, and fish. Low-sodium canned tuna and salmon. Unsalted nuts. Dried peas, beans, and lentils without added salt. Unsalted canned beans. Eggs. Unsalted nut butters. Dairy Milk. Soy milk. Cheese that is naturally low in sodium, such as ricotta cheese, fresh mozzarella, or Swiss cheese Low-sodium or reduced-sodium cheese. Cream cheese. Yogurt. Fats and oils Unsalted butter. Unsalted margarine with no trans fat. Vegetable oils such as canola or olive oils. Seasonings and other foods Fresh and dried herbs and spices. Salt-free seasonings. Low-sodium mustard and ketchup. Sodium-free salad dressing. Sodium-free light mayonnaise. Fresh or refrigerated horseradish. Lemon juice. Vinegar. Homemade, reduced-sodium, or low-sodium soups. Unsalted popcorn and pretzels. Low-salt or salt-free chips. What foods are not recommended? The items listed may not be a complete list. Talk with your dietitian  about what dietary choices are best for you. Grains Instant hot cereals. Bread stuffing, pancake, and biscuit mixes. Croutons. Seasoned rice or pasta mixes. Noodle soup cups. Boxed or frozen macaroni and cheese. Regular salted crackers. Self-rising flour. Vegetables Sauerkraut, pickled vegetables, and relishes. Olives. [place] fries. Onion rings. Regular canned vegetables (not low-sodium or reduced-sodium). Regular canned tomato sauce and paste (not low-sodium or reduced-sodium). Regular tomato and vegetable juice (not low-sodium or reduced-sodium). Frozen vegetables in sauces. Meats and other protein foods Meat or fish that is salted, canned, smoked, spiced, or pickled. Bacon, ham, sausage, hotdogs, corned beef, chipped beef, packaged lunch meats, salt pork, jerky, pickled herring, anchovies, regular canned tuna, sardines, salted nuts. Dairy Processed cheese and cheese spreads. Cheese curds. Blue cheese. Feta cheese. String cheese. Regular cottage cheese. Buttermilk. Canned milk. Fats and oils Salted butter. Regular margarine. Ghee. Bacon fat. Seasonings and other foods Onion salt, garlic salt, seasoned salt, table salt, and sea salt. Canned and packaged gravies. Worcestershire sauce. Tartar sauce. Barbecue sauce. Teriyaki sauce. Soy sauce, including reduced-sodium. Steak sauce. Fish sauce. Oyster sauce. Cocktail sauce. Horseradish that you find on the shelf. Regular ketchup and mustard. Meat flavorings and tenderizers. Bouillon cubes. Hot sauce and Tabasco sauce. Premade or packaged marinades. Premade or packaged taco seasonings. Relishes. Regular salad dressings. Salsa. Potato and tortilla chips. Corn chips and puffs. Salted popcorn and pretzels. Canned or dried soups. Pizza. Frozen entrees and pot pies. Summary  Eating less sodium can help lower your blood pressure, reduce swelling, and protect your heart, liver, and kidneys.  Most people on this plan should limit their sodium intake to  1,500-2,000 mg (milligrams) of sodium each day.  Canned, boxed, and frozen foods are high in sodium. Restaurant foods, fast foods, and pizza are also very high in sodium. You also get sodium by adding salt to food.  Try to cook at home, eat more fresh fruits and vegetables, and eat less fast food, canned, processed, or prepared foods. This information is not intended to replace advice given to you by your health care provider. Make sure you discuss any questions you have with your health care provider. Document Released: 08/06/2001 Document Revised: 02/08/2016 Document Reviewed: 02/08/2016 Elsevier Interactive Patient Education  2017 [place], Ermalinda Barrios, [place]  12/12/2016 9:52 AM    [facility] [address], [place], [place]  [address] Phone: [phone]; Fax: [phone]

## 2016-12-14 ENCOUNTER — Ambulatory Visit (INDEPENDENT_AMBULATORY_CARE_PROVIDER_SITE_OTHER): Payer: Medicare Other | Admitting: *Deleted

## 2016-12-14 DIAGNOSIS — R609 Edema, unspecified: Secondary | ICD-10-CM | POA: Diagnosis not present

## 2016-12-14 DIAGNOSIS — Z5181 Encounter for therapeutic drug level monitoring: Secondary | ICD-10-CM | POA: Diagnosis not present

## 2016-12-14 DIAGNOSIS — Z7984 Long term (current) use of oral hypoglycemic drugs: Secondary | ICD-10-CM | POA: Diagnosis not present

## 2016-12-14 DIAGNOSIS — I4891 Unspecified atrial fibrillation: Secondary | ICD-10-CM | POA: Diagnosis not present

## 2016-12-14 DIAGNOSIS — E1122 Type 2 diabetes mellitus with diabetic chronic kidney disease: Secondary | ICD-10-CM | POA: Diagnosis not present

## 2016-12-14 DIAGNOSIS — I482 Chronic atrial fibrillation: Secondary | ICD-10-CM | POA: Diagnosis not present

## 2016-12-14 DIAGNOSIS — I050 Rheumatic mitral stenosis: Secondary | ICD-10-CM | POA: Diagnosis not present

## 2016-12-14 LAB — POCT INR: INR: 2.4

## 2016-12-16 ENCOUNTER — Encounter: Payer: Self-pay | Admitting: Physician Assistant

## 2016-12-19 ENCOUNTER — Encounter: Payer: Self-pay | Admitting: Physician Assistant

## 2016-12-19 ENCOUNTER — Ambulatory Visit (INDEPENDENT_AMBULATORY_CARE_PROVIDER_SITE_OTHER): Payer: Medicare Other | Admitting: Physician Assistant

## 2016-12-19 ENCOUNTER — Encounter (INDEPENDENT_AMBULATORY_CARE_PROVIDER_SITE_OTHER): Payer: Self-pay

## 2016-12-19 VITALS — BP 130/60 | HR 70 | Ht 68.0 in | Wt 210.0 lb

## 2016-12-19 DIAGNOSIS — I25810 Atherosclerosis of coronary artery bypass graft(s) without angina pectoris: Secondary | ICD-10-CM

## 2016-12-19 DIAGNOSIS — I10 Essential (primary) hypertension: Secondary | ICD-10-CM | POA: Diagnosis not present

## 2016-12-19 DIAGNOSIS — I2510 Atherosclerotic heart disease of native coronary artery without angina pectoris: Secondary | ICD-10-CM | POA: Diagnosis not present

## 2016-12-19 DIAGNOSIS — I5043 Acute on chronic combined systolic (congestive) and diastolic (congestive) heart failure: Secondary | ICD-10-CM | POA: Insufficient documentation

## 2016-12-19 LAB — BASIC METABOLIC PANEL
BUN/Creatinine Ratio: 10 (ref 10–24)
BUN: 16 mg/dL (ref 8–27)
CALCIUM: 8.7 mg/dL (ref 8.6–10.2)
CHLORIDE: 97 mmol/L (ref 96–106)
CO2: 30 mmol/L — ABNORMAL HIGH (ref 20–29)
Creatinine, Ser: 1.62 mg/dL — ABNORMAL HIGH (ref 0.76–1.27)
GFR calc non Af Amer: 37 mL/min/{1.73_m2} — ABNORMAL LOW (ref >59)
GFR, EST AFRICAN AMERICAN: 43 mL/min/{1.73_m2} — AB (ref >59)
GLUCOSE: 156 mg/dL — AB (ref 65–99)
POTASSIUM: 4 mmol/L (ref 3.5–5.2)
Sodium: 144 mmol/L (ref 134–144)

## 2016-12-19 LAB — PRO B NATRIURETIC PEPTIDE: NT-PRO BNP: 1485 pg/mL — AB (ref 0–486)

## 2016-12-19 MED ORDER — TORSEMIDE 20 MG PO TABS: 40.0000 mg | ORAL_TABLET | Freq: Two times a day (BID) | ORAL | 3 refills | Status: DC

## 2016-12-19 NOTE — Progress Notes (Signed)
Cardiology Office Note    Date:  12/19/2016   ID:  [name], DOB [birth year], MRN [id number]  PCP:  Fred Low, MD  Cardiologist: Dr. Irish [name]  No chief complaint on file.   History of Present Illness:  [name] is a 83 y.o. male with history of CAD status post CABG 1 in aVR in 2006. He has chronic atrial fibrillation CHADSVASC=5 on Coumadin and bradycardia treated with pacemaker in 2014, hypertension, DM type II. Last saw Dr. Irish [name] 04/2016 and doing well, Dr. Rayann [name] 05/2016 and pacer working well.   Patient was hospitalized 08/2016 with sepsis question secondary to cellulitis. Also had acute on chronic CK D stage III creatinine on admission 2.01 was 1.48 and had slight pulmonary edema on chest x-ray. ACE inhibitor was held in the hospital but recommending resuming in 10-14 days.   I saw patient last week with significant CHF with weeping of his upper and lower extremities. He had some dyspnea on exertion. He was eating out twice daily. Primary care had increased his Lasix without improvement and ramipril stopped. 2-D echo 12/07/16 LVEF 60-65% with high ventricular filling pressures, moderate mitral stenosis and mild MR and normally functioning aortic valve. BNP was 1801. Creatinine 1.38. I stopped his Lasix and start torsemide 40 mg twice a day for 3 days then 40 mg daily. Weight was 220 pounds that day.  Patient comes in today feeling a little better. He's lost 10 pounds but it occurred in the first 3 days. His arm swelling is down a little but he still is swollen. His hand isn't as swollen as it was. Still has some dyspnea on exertion. He continues to eat out twice a day. He doesn't understand that there is salt in foods that he's eating even though he is not using saltshaker.    Past Medical History:  Diagnosis Date  . Aortic stenosis   . BPH (benign prostatic hypertrophy)   . Coronary artery disease   . Dermatophytosis of nail   . Diabetes mellitus without  complication ([place])    type 2  . Diverticulosis   . DVT (deep venous thrombosis) ([place])   . Gastroesophageal reflux disease   . GERD (gastroesophageal reflux disease)   . Gout   . 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 Kidney stones   . PAH (pulmonary artery hypertension) ([place])    PAS 63 on ECHO 09/2012  . Permanent atrial fibrillation ([place])   . PVD (peripheral vascular disease) ([place])   . Shortness of breath   . Symptomatic bradycardia 09-2012   s/p Medtronic pacemaker implanted by Dr [name]     Past Surgical History:  Procedure Laterality Date  . AORTIC VALVE REPLACEMENT  2006  . BALLOON DILATION N/A 12/16/2013   Procedure: BALLOON DILATION;  Surgeon: Fred Fair, MD;  Location: [facility] ENDOSCOPY;  Service: Endoscopy;  Laterality: N/A;  . CORONARY ARTERY BYPASS GRAFT    . ESOPHAGOGASTRODUODENOSCOPY N/A 12/16/2013   Procedure: ESOPHAGOGASTRODUODENOSCOPY (EGD);  Surgeon: Fred Fair, MD;  Location: [facility];  Service: Endoscopy;  Laterality: N/A;  . HERNIA REPAIR    . PACEMAKER INSERTION  10-04-2012   Medtronic pacemaker implanted by Dr [name] for symptomatic bradycardia  . PARAESOPHAGEAL HERNIA REPAIR    . PERMANENT PACEMAKER INSERTION N/A 10/04/2012   Procedure: PERMANENT PACEMAKER INSERTION;  Surgeon: Fred Grayer, MD;  Location: [facility] CATH LAB;  Service: Cardiovascular;  Laterality: N/A;  . TEE WITHOUT CARDIOVERSION N/A 12/25/2014   Procedure: TRANSESOPHAGEAL  ECHOCARDIOGRAM (TEE)   (inpatient at [facility]);  Surgeon: Fred Latch, MD;  Location: [facility] ENDOSCOPY;  Service: Cardiovascular;  Laterality: N/A;    Current Medications: Current Meds  Medication Sig  . acetaminophen (TYLENOL) 325 MG tablet Take 325-650 mg by mouth every 6 (six) hours as needed for mild pain or headache.  . Coenzyme Q10 (CO Q 10 PO) Take 1 capsule by mouth daily.  [name] glimepiride (AMARYL) 1 MG tablet Take 1 mg by mouth 2 (two) times daily.   . nitroGLYCERIN (NITROSTAT) 0.4 MG SL tablet Place 1 tablet  (0.4 mg total) under the tongue every 5 (five) minutes x 3 doses as needed for chest pain.  . tamsulosin (FLOMAX) 0.4 MG CAPS capsule Take 1 capsule (0.4 mg total) by mouth daily.  [name] warfarin (COUMADIN) 2.5 MG tablet Take 1 tablet (2.5 mg total) by mouth daily.     Allergies:   Demerol [meperidine]   Social History   Social History  . Marital status: Married    Spouse name: N/A  . Number of children: N/A  . Years of education: N/A   Social History Main Topics  . Smoking status: Never Smoker  . Smokeless tobacco: Never Used  . Alcohol use No  . Drug use: No  . Sexual activity: Not Asked   Other Topics Concern  . None   Social History Narrative  . None     Family History:  The patient's family history includes Cancer - Other in his father and mother.   ROS:   Please see the history of present illness.    Review of Systems  Constitution: Negative.  HENT: Negative.   Cardiovascular: Positive for dyspnea on exertion and leg swelling.  Respiratory: Negative.   Endocrine: Negative.   Hematologic/Lymphatic: Negative.   Musculoskeletal: Negative.   Gastrointestinal: Negative.   Genitourinary: Negative.   Neurological: Negative.    All other systems reviewed and are negative.   PHYSICAL EXAM:   VS:  Ht 5\' 8"  (1.727 m)   Wt 210 lb (95.3 kg)   BMI 31.93 kg/m   Physical Exam  GEN: Well nourished, well developed, in no acute distress  Neck: no JVD, carotid bruits, or masses Cardiac:RRR; no murmurs, rubs, or gallops  Respiratory:  Decreased breath sounds at the left base with crackles clear on the right GI: soft, nontender, nondistended, + BS Ext: 2-3 edema on the right leg 3-4 in the left leg, left leg in brace  Neuro:  Alert and Oriented x 3 Psych: euthymic mood, full affect  Wt Readings from Last 3 Encounters:  12/19/16 210 lb (95.3 kg)  12/12/16 220 lb (99.8 kg)  09/02/16 210 lb 12.2 oz (95.6 kg)      Studies/Labs Reviewed:   EKG:  EKG is not ordered today.     Recent Labs: 09/03/2016: TSH 5.825 09/04/2016: Magnesium 1.8 09/05/2016: Hemoglobin 11.9; Platelets 142 09/06/2016: ALT 14 12/12/2016: BUN 13; Creatinine, Ser 1.38; NT-Pro BNP 1,801; Potassium 4.1; Sodium 144   Lipid Panel No results found for: CHOL, TRIG, HDL, CHOLHDL, VLDL, LDLCALC, LDLDIRECT  Additional studies/ records that were reviewed today include:    2-D echo 12/07/16   Study Conclusions   - Procedure narrative: Transthoracic echocardiography. Image   quality was suboptimal. The study was technically difficult.   Intravenous contrast (Definity) was administered. - Left ventricle: The cavity size was normal. Systolic function was   normal. The estimated ejection fraction was in the range of 60%   to 65%. Wall  motion was normal; there were no regional wall   motion abnormalities. The study was not technically sufficient to   allow evaluation of LV diastolic dysfunction due to atrial   fibrillation. Doppler parameters are consistent with high   ventricular filling pressure. - Aortic valve: Poorly visualized. A bioprosthesis was present and   functioning normally. Mean gradient (S): 11 mm Hg. Peak gradient   (S): 18 mm Hg. - Mitral valve: Moderate diffuse thickening and calcification of   the anterior leaflet. Mobility was restricted. The findings are   consistent with moderate stenosis. There was mild regurgitation.   Valve area by pressure half-time: 1.29 cm^2. - Left atrium: The atrium was mildly dilated. - Pulmonary arteries: PA peak pressure: 42 mm Hg (S).    DATE OF PROCEDURE:  05/11/2004  DATE OF DISCHARGE:                                  OPERATIVE REPORT    OPERATION:  1.  Coronary bypass grafting x1 (saphenous vein graft to right coronary      artery).  2.  Aortic valve replacement with a 21-mm pericardial tissue valve (Edwards,      model 2700 serial number [id number]).       ASSESSMENT:    No diagnosis found.   PLAN:  In order of problems listed  above:  Acute on chronic diastolic CHF echo 53/97/67 with high ventricular filling pressures and moderate mitral stenosis but normal LV function. He did diurese 10 pounds with Demadex 40 mg twice a day for 3 days then 40 mg once daily. He still has quite a bit of edema. Will increase Demadex to 40 mg twice a day. Recheck renal function and BNP today. I will see him back in one week. 2 g sodium diet again discussed.  CAD status post CABG 1 with SVG to the RCA and aortic valve replacement with pericardial tissue valve 04/2004. Aortic valve functioning well on recent echo.  Atrial fibrillation on Coumadin  CK D stage III creatinine 1.39 last office visit. We'll repeat today.  Pacemaker for symptomatic bradycardia in 2014 functioning normally in 05/2016.    Medication Adjustments/Labs and Tests Ordered: Current medicines are reviewed at length with the patient today.  Concerns regarding medicines are outlined above.  Medication changes, Labs and Tests ordered today are listed in the Patient Instructions below. There are no Patient Instructions on file for this visit.   Sumner Boast, PA-C  12/19/2016 11:21 AM    [facility] [place], [place], [place]  [address] Phone: [phone]; Fax: [phone]

## 2016-12-19 NOTE — Patient Instructions (Addendum)
Medication Instructions:  1. INCREASE DEMADEX TO 40 MG TWICE DAILY; NEW RX HAS BEEN SENT IN  Labwork: TODAY PRO BNP, BMET  Testing/Procedures: NONE ORDERED TODAY  Follow-Up: 1. [place] , LENZE, PAC ON 12/26/16 @ 1:15  2. DR. Irish Lack ON 02/13/17 @ 4 PM   Any Other Special Instructions Will Be Listed Below (If Applicable). If you need a refill on your cardiac medications before your next appointment, please call your pharmacy.   Low-Sodium Eating Plan Sodium, which is an element that makes up salt, helps you maintain a healthy balance of fluids in your body. Too much sodium can increase your blood pressure and cause fluid and waste to be held in your body. Your health care provider or dietitian may recommend following this plan if you have high blood pressure (hypertension), kidney disease, liver disease, or heart failure. Eating less sodium can help lower your blood pressure, reduce swelling, and protect your heart, liver, and kidneys. What are tips for following this plan? General guidelines  Most people on this plan should limit their sodium intake to 1,500-2,000 mg (milligrams) of sodium each day. Reading food labels  The Nutrition Facts label lists the amount of sodium in one serving of the food. If you eat more than one serving, you must multiply the listed amount of sodium by the number of servings.  Choose foods with less than 140 mg of sodium per serving.  Avoid foods with 300 mg of sodium or more per serving. Shopping  Look for lower-sodium products, often labeled as "low-sodium" or "no salt added."  Always check the sodium content even if foods are labeled as "unsalted" or "no salt added".  Buy fresh foods. ? Avoid canned foods and premade or frozen meals. ? Avoid canned, cured, or processed meats  Buy breads that have less than 80 mg of sodium per slice. Cooking  Eat more home-cooked food and less restaurant, buffet, and fast food.  Avoid  adding salt when cooking. Use salt-free seasonings or herbs instead of table salt or sea salt. Check with your health care provider or pharmacist before using salt substitutes.  Cook with plant-based oils, such as canola, sunflower, or olive oil. Meal planning  When eating at a restaurant, ask that your food be prepared with less salt or no salt, if possible.  Avoid foods that contain MSG (monosodium glutamate). MSG is sometimes added to [place] food, bouillon, and some canned foods. What foods are recommended? The items listed may not be a complete list. Talk with your dietitian about what dietary choices are best for you. Grains Low-sodium cereals, including oats, puffed wheat and rice, and shredded wheat. Low-sodium crackers. Unsalted rice. Unsalted pasta. Low-sodium bread. Whole-grain breads and whole-grain pasta. Vegetables Fresh or frozen vegetables. "No salt added" canned vegetables. "No salt added" tomato sauce and paste. Low-sodium or reduced-sodium tomato and vegetable juice. Fruits Fresh, frozen, or canned fruit. Fruit juice. Meats and other protein foods Fresh or frozen (no salt added) meat, poultry, seafood, and fish. Low-sodium canned tuna and salmon. Unsalted nuts. Dried peas, beans, and lentils without added salt. Unsalted canned beans. Eggs. Unsalted nut butters. Dairy Milk. Soy milk. Cheese that is naturally low in sodium, such as ricotta cheese, fresh mozzarella, or Swiss cheese Low-sodium or reduced-sodium cheese. Cream cheese. Yogurt. Fats and oils Unsalted butter. Unsalted margarine with no trans fat. Vegetable oils such as canola or olive oils. Seasonings and other foods Fresh and dried herbs and spices. Salt-free seasonings. Low-sodium  mustard and ketchup. Sodium-free salad dressing. Sodium-free light mayonnaise. Fresh or refrigerated horseradish. Lemon juice. Vinegar. Homemade, reduced-sodium, or low-sodium soups. Unsalted popcorn and pretzels. Low-salt or salt-free  chips. What foods are not recommended? The items listed may not be a complete list. Talk with your dietitian about what dietary choices are best for you. Grains Instant hot cereals. Bread stuffing, pancake, and biscuit mixes. Croutons. Seasoned rice or pasta mixes. Noodle soup cups. Boxed or frozen macaroni and cheese. Regular salted crackers. Self-rising flour. Vegetables Sauerkraut, pickled vegetables, and relishes. Olives. [place] fries. Onion rings. Regular canned vegetables (not low-sodium or reduced-sodium). Regular canned tomato sauce and paste (not low-sodium or reduced-sodium). Regular tomato and vegetable juice (not low-sodium or reduced-sodium). Frozen vegetables in sauces. Meats and other protein foods Meat or fish that is salted, canned, smoked, spiced, or pickled. Bacon, ham, sausage, hotdogs, corned beef, chipped beef, packaged lunch meats, salt pork, jerky, pickled herring, anchovies, regular canned tuna, sardines, salted nuts. Dairy Processed cheese and cheese spreads. Cheese curds. Blue cheese. Feta cheese. String cheese. Regular cottage cheese. Buttermilk. Canned milk. Fats and oils Salted butter. Regular margarine. Ghee. Bacon fat. Seasonings and other foods Onion salt, garlic salt, seasoned salt, table salt, and sea salt. Canned and packaged gravies. Worcestershire sauce. Tartar sauce. Barbecue sauce. Teriyaki sauce. Soy sauce, including reduced-sodium. Steak sauce. Fish sauce. Oyster sauce. Cocktail sauce. Horseradish that you find on the shelf. Regular ketchup and mustard. Meat flavorings and tenderizers. Bouillon cubes. Hot sauce and Tabasco sauce. Premade or packaged marinades. Premade or packaged taco seasonings. Relishes. Regular salad dressings. Salsa. Potato and tortilla chips. Corn chips and puffs. Salted popcorn and pretzels. Canned or dried soups. Pizza. Frozen entrees and pot pies. Summary  Eating less sodium can help lower your blood pressure, reduce swelling, and  protect your heart, liver, and kidneys.  Most people on this plan should limit their sodium intake to 1,500-2,000 mg (milligrams) of sodium each day.  Canned, boxed, and frozen foods are high in sodium. Restaurant foods, fast foods, and pizza are also very high in sodium. You also get sodium by adding salt to food.  Try to cook at home, eat more fresh fruits and vegetables, and eat less fast food, canned, processed, or prepared foods. This information is not intended to replace advice given to you by your health care provider. Make sure you discuss any questions you have with your health care provider. Document Released: 08/06/2001 Document Revised: 02/08/2016 Document Reviewed: 02/08/2016 Elsevier Interactive Patient Education  2017 [employer].

## 2016-12-20 ENCOUNTER — Telehealth: Payer: Self-pay | Admitting: *Deleted

## 2016-12-20 NOTE — Telephone Encounter (Signed)
DPR ok to s/w pt's wife who has been notified of lab results by phone with verbal understanding. Pt's wife thanked me for my call.

## 2016-12-20 NOTE — Telephone Encounter (Signed)
Follow up     Pt was returning carol call for lab results

## 2016-12-20 NOTE — Telephone Encounter (Signed)
Left message to go over lab results.  

## 2016-12-20 NOTE — Telephone Encounter (Signed)
-----   Message from Imogene Burn, PA-C sent at 12/20/2016  7:50 AM EDT ----- Heart failure marker better but still elevated. Kidney function a bit worse. No further med changes. F/u next week as scheduled and will repeat labs.

## 2016-12-26 ENCOUNTER — Ambulatory Visit (INDEPENDENT_AMBULATORY_CARE_PROVIDER_SITE_OTHER): Payer: Medicare Other | Admitting: *Deleted

## 2016-12-26 ENCOUNTER — Ambulatory Visit (INDEPENDENT_AMBULATORY_CARE_PROVIDER_SITE_OTHER): Payer: Medicare Other | Admitting: Physician Assistant

## 2016-12-26 ENCOUNTER — Encounter: Payer: Self-pay | Admitting: Physician Assistant

## 2016-12-26 VITALS — BP 122/70 | HR 81 | Ht 68.0 in | Wt 197.8 lb

## 2016-12-26 DIAGNOSIS — I25810 Atherosclerosis of coronary artery bypass graft(s) without angina pectoris: Secondary | ICD-10-CM

## 2016-12-26 DIAGNOSIS — N183 Chronic kidney disease, stage 3 unspecified: Secondary | ICD-10-CM

## 2016-12-26 DIAGNOSIS — I4891 Unspecified atrial fibrillation: Secondary | ICD-10-CM

## 2016-12-26 DIAGNOSIS — Z5181 Encounter for therapeutic drug level monitoring: Secondary | ICD-10-CM | POA: Diagnosis not present

## 2016-12-26 DIAGNOSIS — I2510 Atherosclerotic heart disease of native coronary artery without angina pectoris: Secondary | ICD-10-CM

## 2016-12-26 DIAGNOSIS — I5042 Chronic combined systolic (congestive) and diastolic (congestive) heart failure: Secondary | ICD-10-CM | POA: Diagnosis not present

## 2016-12-26 DIAGNOSIS — I10 Essential (primary) hypertension: Secondary | ICD-10-CM

## 2016-12-26 LAB — POCT INR: INR: 2.3

## 2016-12-26 NOTE — Progress Notes (Signed)
Cardiology Office Note    Date:  12/26/2016   ID:  [name], DOB Mar 22, 1925, MRN [id number]  PCP:  Wenda Low, MD  Cardiologist: Dr. Irish Lack  Chief Complaint  Patient presents with  . Follow-up    History of Present Illness:  [name] is a 83 y.o. male with history of CAD status post CABG 1 in aVR in 2006. He has chronic atrial fibrillation CHADSVASC=5 on Coumadin and bradycardia treated with pacemaker in 2014, hypertension, DM type II. Last saw Dr. Irish Lack 04/2016 and doing well, Dr. Rayann Heman 05/2016 and pacer working well.   Patient was hospitalized 08/2016 with sepsis question secondary to cellulitis. Also had acute on chronic CK D stage III creatinine on admission 2.01 was 1.48 and had slight pulmonary edema on chest x-ray. ACE inhibitor was held in the hospital but recommending resuming in 10-14 days.  I saw the patient 12/12/16 with significant CHF with weeping of his upper and lower extremities. 2-D echo 12/07/16 LVEF 60-65% with high ventricular filling pressures and moderate mitral stenosis and mild MR and normally functioning aortic valve. BNP was 1801. Creatinine 1.38. I stopped his Lasix and start torsemide 40 mg twice a day for 3 days and 40 mg daily. Weight was 220 pounds that date. I saw back 12/19/16 he was feeling a little better. He had lost 10 pounds in the first 3 days but then the swelling started to come back. Increase his Demadex to 40 mg twice a day. BNP that day was 1485 and creatinine was up to 1.62.  Patient comes in today accompanied by his wife. His swelling has greatly improved. His arm swelling is down in his upper thigh swelling is down he still has some lower extremity edema. He feels a lot better. His weight is 197 pounds.   Past Medical History:  Diagnosis Date  . Aortic stenosis   . BPH (benign prostatic hypertrophy)   . Coronary artery disease   . Dermatophytosis of nail   . Diabetes mellitus without complication ([place])    type 2   . Diverticulosis   . DVT (deep venous thrombosis) ([place])   . Gastroesophageal reflux disease   . GERD (gastroesophageal reflux disease)   . Gout   . 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 Kidney stones   . PAH (pulmonary artery hypertension) ([place])    PAS 63 on ECHO 09/2012  . Permanent atrial fibrillation ([place])   . PVD (peripheral vascular disease) ([place])   . Shortness of breath   . 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 AORTIC VALVE REPLACEMENT  2006  . BALLOON DILATION N/A 12/16/2013   Procedure: BALLOON DILATION;  Surgeon: Garlan Fair, MD;  Location: [facility] ENDOSCOPY;  Service: Endoscopy;  Laterality: N/A;  . CORONARY ARTERY BYPASS GRAFT    . ESOPHAGOGASTRODUODENOSCOPY N/A 12/16/2013   Procedure: ESOPHAGOGASTRODUODENOSCOPY (EGD);  Surgeon: Garlan Fair, MD;  Location: [facility];  Service: Endoscopy;  Laterality: N/A;  . HERNIA REPAIR    . PACEMAKER INSERTION  10-04-2012   Medtronic pacemaker implanted by Dr Rayann Heman for symptomatic bradycardia  . PARAESOPHAGEAL HERNIA REPAIR    . PERMANENT PACEMAKER INSERTION N/A 10/04/2012   Procedure: PERMANENT PACEMAKER INSERTION;  Surgeon: Thompson Grayer, MD;  Location: [facility] CATH LAB;  Service: Cardiovascular;  Laterality: N/A;  . TEE WITHOUT CARDIOVERSION N/A 12/25/2014   Procedure: TRANSESOPHAGEAL ECHOCARDIOGRAM (TEE)   (  inpatient at [facility]);  Surgeon: Skeet Latch, MD;  Location: [facility] ENDOSCOPY;  Service: Cardiovascular;  Laterality: N/A;    Current Medications: Current Meds  Medication Sig  . acetaminophen (TYLENOL) 325 MG tablet Take 325-650 mg by mouth every 6 (six) hours as needed for mild pain or headache.  . Coenzyme Q10 (CO Q 10 PO) Take 1 capsule by mouth daily.  [name] glimepiride (AMARYL) 1 MG tablet Take 1 mg by mouth 2 (two) times daily.   . nitroGLYCERIN (NITROSTAT) 0.4 MG SL tablet Place 1 tablet (0.4 mg total) under the tongue  every 5 (five) minutes x 3 doses as needed for chest pain.  . tamsulosin (FLOMAX) 0.4 MG CAPS capsule Take 1 capsule (0.4 mg total) by mouth daily.  [name] torsemide (DEMADEX) 20 MG tablet Take 2 tablets (40 mg total) by mouth 2 (two) times daily.  [name] warfarin (COUMADIN) 2.5 MG tablet Take 1 tablet (2.5 mg total) by mouth daily.     Allergies:   Demerol [meperidine]   Social History   Social History  . Marital status: Married    Spouse name: N/A  . Number of children: N/A  . Years of education: N/A   Social History Main Topics  . Smoking status: Never Smoker  . Smokeless tobacco: Never Used  . Alcohol use No  . Drug use: No  . Sexual activity: Not on file   Other Topics Concern  . Not on file   Social History Narrative  . No narrative on file     Family History:  The patient's family history includes Cancer - Other in his father and mother.   ROS:   Please see the history of present illness.    Review of Systems  Constitution: Positive for weakness, malaise/fatigue and weight loss.  HENT: Negative.   Cardiovascular: Negative.   Respiratory: Negative.   Endocrine: Negative.   Hematologic/Lymphatic: Negative.   Musculoskeletal: Negative.   Gastrointestinal: Negative.   Genitourinary: Negative.    All other systems reviewed and are negative.   PHYSICAL EXAM:   VS:  BP 122/70   Pulse 81   Ht 5\' 8"  (1.727 m)   Wt 197 lb 12.8 oz (89.7 kg)   SpO2 94%   BMI 30.08 kg/m   Physical Exam  GEN: Well nourished, well developed, in no acute distress  Neck: no JVD, carotid bruits, or masses Cardiac:Irregular irregular; 2/6 systolic murmur at the left sternal border Respiratory:Decreased breath sounds with crackles at the left base GI: soft, nontender, nondistended, + BS Ext: +1 edema on the right +2 edema on the left without cyanosis, clubbing Good distal pulses bilaterally Neuro:  Alert and Oriented x 3 Psych: euthymic mood, full affect  Wt Readings from Last 3 Encounters:   12/26/16 197 lb 12.8 oz (89.7 kg)  12/19/16 210 lb (95.3 kg)  12/12/16 220 lb (99.8 kg)      Studies/Labs Reviewed:   EKG:  EKG is not ordered today.    Recent Labs: 09/03/2016: TSH 5.825 09/04/2016: Magnesium 1.8 09/05/2016: Hemoglobin 11.9; Platelets 142 09/06/2016: ALT 14 12/19/2016: BUN 16; Creatinine, Ser 1.62; NT-Pro BNP 1,485; Potassium 4.0; Sodium 144   Lipid Panel No results found for: CHOL, TRIG, HDL, CHOLHDL, VLDL, LDLCALC, LDLDIRECT  Additional studies/ records that were reviewed today include:   2-D echo 12/07/16   Study Conclusions   - Procedure narrative: Transthoracic echocardiography. Image   quality was suboptimal. The study was technically difficult.   Intravenous contrast (Definity) was administered. -  Left ventricle: The cavity size was normal. Systolic function was   normal. The estimated ejection fraction was in the range of 60%   to 65%. Wall motion was normal; there were no regional wall   motion abnormalities. The study was not technically sufficient to   allow evaluation of LV diastolic dysfunction due to atrial   fibrillation. Doppler parameters are consistent with high   ventricular filling pressure. - Aortic valve: Poorly visualized. A bioprosthesis was present and   functioning normally. Mean gradient (S): 11 mm Hg. Peak gradient   (S): 18 mm Hg. - Mitral valve: Moderate diffuse thickening and calcification of   the anterior leaflet. Mobility was restricted. The findings are   consistent with moderate stenosis. There was mild regurgitation.   Valve area by pressure half-time: 1.29 cm^2. - Left atrium: The atrium was mildly dilated. - Pulmonary arteries: PA peak pressure: 42 mm Hg (S).    DATE OF PROCEDURE:  05/11/2004  DATE OF DISCHARGE:                                  OPERATIVE REPORT    OPERATION:  1.  Coronary bypass grafting x1 (saphenous vein graft to right coronary      artery).  2.  Aortic valve replacement with a 21-mm  pericardial tissue valve (Edwards,      model 2700 serial number [id number]).        ASSESSMENT:    1. Chronic combined systolic and diastolic CHF (congestive heart failure) ([place])   2. Atrial fibrillation, unspecified type ([place])   3. Coronary artery disease involving native coronary artery of native heart without angina pectoris   4. Essential hypertension, benign   5. CKD (chronic kidney disease) stage 3, GFR 30-59 ml/min (HCC)      PLAN:  In order of problems listed above: Chronic combined systolic and diastolic CHF patient has lost a total of 23 pounds over the past 2 weeks. He still has some swelling in his lower extremities. Will continue current dose of Demadex 40 mg twice a day. Check renal function today. Adjust if creatinine continues to rise. Continue 2 g sodium diet. Follow-up with Dr.Varanasi in Dec and with me sooner if needed.  CAD status post CABG 1 with SVG to the RCA and aortic valve replacement with pericardial tissue valve 2006. Uric valve functioning normally on recent echo  Atrial fibrillation on Coumadin  CK D stage III creatinine 1.39 followed by 1.65. We'll repeat today.  Pacemaker for symptomatic bradycardia in 2014 functioning normally in 05/2016.        Medication Adjustments/Labs and Tests Ordered: Current medicines are reviewed at length with the patient today.  Concerns regarding medicines are outlined above.  Medication changes, Labs and Tests ordered today are listed in the Patient Instructions below. There are no Patient Instructions on file for this visit.   Signed, Ermalinda Barrios, PA-C  12/26/2016 1:00 PM    [facility] [place], [place], [place]  [address] Phone: [phone]; Fax: [phone]

## 2016-12-26 NOTE — Patient Instructions (Signed)
Your physician recommends that you continue on your current medications as directed. Please refer to the Current Medication list given to you today.  Your physician recommends that you return for lab work in:  TODAY BNP BMET    Your physician recommends that you schedule a follow-up appointment in:  AS SCHEDULED

## 2016-12-27 LAB — BASIC METABOLIC PANEL
BUN / CREAT RATIO: 12 (ref 10–24)
BUN: 21 mg/dL (ref 8–27)
CHLORIDE: 94 mmol/L — AB (ref 96–106)
CO2: 30 mmol/L — ABNORMAL HIGH (ref 20–29)
CREATININE: 1.75 mg/dL — AB (ref 0.76–1.27)
Calcium: 7.8 mg/dL — ABNORMAL LOW (ref 8.6–10.2)
GFR calc non Af Amer: 34 mL/min/{1.73_m2} — ABNORMAL LOW (ref >59)
GFR, EST AFRICAN AMERICAN: 39 mL/min/{1.73_m2} — AB (ref >59)
GLUCOSE: 118 mg/dL — AB (ref 65–99)
POTASSIUM: 3.8 mmol/L (ref 3.5–5.2)
SODIUM: 140 mmol/L (ref 134–144)

## 2016-12-27 LAB — PRO B NATRIURETIC PEPTIDE: NT-PRO BNP: 952 pg/mL — AB (ref 0–486)

## 2016-12-28 ENCOUNTER — Telehealth: Payer: Self-pay | Admitting: Physician Assistant

## 2016-12-28 DIAGNOSIS — Z79899 Other long term (current) drug therapy: Secondary | ICD-10-CM

## 2016-12-28 NOTE — Telephone Encounter (Signed)
New message  Pt wife verbalized that she is calling to get pt lab results

## 2016-12-28 NOTE — Telephone Encounter (Signed)
Returned wife, Pamala Hurry, [place] on file, and she has been made aware of pts lab results He will return for repeat bmet 01/10/17.

## 2016-12-28 NOTE — Telephone Encounter (Signed)
Tried to call wife, Pamala Hurry, [place] on file, a couple of times, and it was busy.

## 2016-12-30 ENCOUNTER — Telehealth: Payer: Self-pay | Admitting: Physician Assistant

## 2016-12-30 NOTE — Telephone Encounter (Signed)
New message    Patient wife calling, wants to confirm that patient needs to continue Torsemide long term.  1. Name of Medication: torsemide (DEMADEX) 20 MG tablet  2. How are you currently taking this medication (dosage and times per day)? Take 2 tablets (40 mg total) by mouth 2 (two) times daily.  3. Are you having a reaction (difficulty breathing--STAT)? no  4. What is your medication issue?She does not want to get a 90 day supply of medication if he will not to remain on medication.

## 2016-12-30 NOTE — Telephone Encounter (Signed)
Notes recorded by Imogene Burn, PA-C on 12/27/2016 at 9:55 AM EDT Kidney function up a little but heart failure marker still elevated. Continue current dose Lasix. Recheck renal function in 2 weeks  Copied from lab results done 12/26/16.   I spoke with patient's wife, Hoy Morn is asking if pt should continue current dose of torsemide. Pamala Hurry advised to continue current torsemide dose based on these lab results and office note from 12/26/16, return for repeat BMET 01/10/17.

## 2017-01-05 DIAGNOSIS — L821 Other seborrheic keratosis: Secondary | ICD-10-CM | POA: Diagnosis not present

## 2017-01-05 DIAGNOSIS — L570 Actinic keratosis: Secondary | ICD-10-CM | POA: Diagnosis not present

## 2017-01-05 DIAGNOSIS — Z85828 Personal history of other malignant neoplasm of skin: Secondary | ICD-10-CM | POA: Diagnosis not present

## 2017-01-05 DIAGNOSIS — L820 Inflamed seborrheic keratosis: Secondary | ICD-10-CM | POA: Diagnosis not present

## 2017-01-10 ENCOUNTER — Other Ambulatory Visit: Payer: Medicare Other

## 2017-01-10 DIAGNOSIS — Z79899 Other long term (current) drug therapy: Secondary | ICD-10-CM

## 2017-01-10 LAB — BASIC METABOLIC PANEL
BUN/Creatinine Ratio: 18 (ref 10–24)
BUN: 30 mg/dL — ABNORMAL HIGH (ref 8–27)
CO2: 28 mmol/L (ref 20–29)
Calcium: 9.6 mg/dL (ref 8.6–10.2)
Chloride: 95 mmol/L — ABNORMAL LOW (ref 96–106)
Creatinine, Ser: 1.66 mg/dL — ABNORMAL HIGH (ref 0.76–1.27)
GFR calc Af Amer: 42 mL/min/{1.73_m2} — ABNORMAL LOW (ref >59)
GFR, EST NON AFRICAN AMERICAN: 36 mL/min/{1.73_m2} — AB (ref >59)
Glucose: 164 mg/dL — ABNORMAL HIGH (ref 65–99)
POTASSIUM: 4.4 mmol/L (ref 3.5–5.2)
SODIUM: 140 mmol/L (ref 134–144)

## 2017-01-13 ENCOUNTER — Telehealth: Payer: Self-pay | Admitting: Physician Assistant

## 2017-01-13 NOTE — Telephone Encounter (Signed)
F/u message  Pt returning RN call about pt lab work results please call back to discuss

## 2017-01-13 NOTE — Telephone Encounter (Signed)
Discussed lab results done 01/10/17 with pt's wife, Pamala Hurry [facility]), she verbalized understanding.

## 2017-01-16 ENCOUNTER — Encounter: Payer: Self-pay | Admitting: *Deleted

## 2017-01-16 ENCOUNTER — Ambulatory Visit (INDEPENDENT_AMBULATORY_CARE_PROVIDER_SITE_OTHER): Payer: Medicare Other | Admitting: Pharmacist

## 2017-01-16 DIAGNOSIS — Z5181 Encounter for therapeutic drug level monitoring: Secondary | ICD-10-CM | POA: Diagnosis not present

## 2017-01-16 DIAGNOSIS — I4891 Unspecified atrial fibrillation: Secondary | ICD-10-CM | POA: Diagnosis not present

## 2017-01-16 LAB — POCT INR: INR: 1.8

## 2017-01-16 NOTE — Patient Instructions (Signed)
Take 1 tablet tonight (2.5mg  total) then continue taking 1 tablet (2.5mg ) daily except take 1/2 tablet (1.25mg ) on Monday, Wednesday, and Friday. Recheck in 2 weeks. Call us if any new medications are added or changed. [phone]

## 2017-01-27 ENCOUNTER — Telehealth: Payer: Self-pay | Admitting: Interventional Cardiology

## 2017-01-27 NOTE — Telephone Encounter (Signed)
Patient's wife Pamala Hurry calling ([facility] on file) and states that they received a letter in the mail today dated 11/19 instructed them to call regarding lab results. She is asking if the patient needs more labs. Made her aware that the nurse sent this letter on 11/19 in efforts to inform them of lab results from 10/29. It appears that this nurse did not see the phone encounter on 10/31 where the patient's wife was notified of lab results from 10/29 and was instructed to have labs repeated on 11/13. Patient already repeated labs on 11/13 and results given. Wife is asking if the patient should continue taking torsemide 40 mg BID. Recommendations from results on 11/13 were for the patient to continue current dose of diuretics. She states that the patient is feeling better and his LE swelling is better. She states that he does not have any SOB or weight gain. Instructed for the patient to continue current dose of torsemide and keep f/u appointment with Dr. Irish Lack 12/17 4:00 PM and we will reassess labs at the appointment if needed. Instructed for her to let us know if his symptoms worsen before the appointment. She verbalized understanding and thanked me for the call.

## 2017-01-27 NOTE — Telephone Encounter (Signed)
[name] is calling about lab results . Please call

## 2017-01-31 ENCOUNTER — Ambulatory Visit (INDEPENDENT_AMBULATORY_CARE_PROVIDER_SITE_OTHER): Payer: Medicare Other | Admitting: *Deleted

## 2017-01-31 DIAGNOSIS — Z5181 Encounter for therapeutic drug level monitoring: Secondary | ICD-10-CM | POA: Diagnosis not present

## 2017-01-31 DIAGNOSIS — I4891 Unspecified atrial fibrillation: Secondary | ICD-10-CM

## 2017-01-31 LAB — POCT INR: INR: 1.9

## 2017-01-31 NOTE — Patient Instructions (Signed)
Change your dose to 1 tablet everyday except 1/2 tablet on Mondays and Fridays.  Recheck in 2 weeks. Call us if any new medications are added or changed. [phone]

## 2017-02-09 ENCOUNTER — Ambulatory Visit (INDEPENDENT_AMBULATORY_CARE_PROVIDER_SITE_OTHER): Payer: Medicare Other | Admitting: Podiatry

## 2017-02-09 ENCOUNTER — Encounter: Payer: Self-pay | Admitting: Podiatry

## 2017-02-09 DIAGNOSIS — M79609 Pain in unspecified limb: Secondary | ICD-10-CM | POA: Diagnosis not present

## 2017-02-09 DIAGNOSIS — B351 Tinea unguium: Secondary | ICD-10-CM

## 2017-02-09 DIAGNOSIS — E1159 Type 2 diabetes mellitus with other circulatory complications: Secondary | ICD-10-CM

## 2017-02-09 NOTE — Progress Notes (Signed)
Complaint:  Visit Type: Patient returns to my office for continued preventative foot care services. Complaint: Patient states" my nails have grown long and thick and become painful to walk and wear shoes" Patient has been diagnosed with DM with no foot neuropathy.. The patient presents for preventative foot care services. No changes to ROS  Podiatric Exam: Vascular: dorsalis pedis and posterior tibial pulses are not  palpable bilatera due to severe swelling both feet.. Capillary return is immediate. Temperature gradient is WNL. Skin turgor WNL  Sensorium: Diminished  Semmes Weinstein monofilament test. Normal tactile sensation bilaterally. Nail Exam: Pt has thick disfigured discolored nails with subungual debris noted bilateral entire nail hallux through fifth toenails Ulcer Exam: There is no evidence of ulcer or pre-ulcerative changes or infection. Orthopedic Exam: Muscle tone and strength are WNL. No limitations in general ROM. No crepitus or effusions noted. Foot type and digits show no abnormalities. Bony prominences are unremarkable. Skin: No Porokeratosis. No infection or ulcers  Diagnosis:  Onychomycosis, , Pain in right toe, pain in left toes  Treatment & Plan Procedures and Treatment: Consent by patient was obtained for treatment procedures. The patient understood the discussion of treatment and procedures well. All questions were answered thoroughly reviewed. Debridement of mycotic and hypertrophic toenails, 1 through 5 bilateral and clearing of subungual debris. No ulceration, no infection noted.  Return Visit-Office Procedure: Patient instructed to return to the office for a follow up visit 3 months for continued evaluation and treatment.    Gardiner Barefoot DPM

## 2017-02-13 ENCOUNTER — Ambulatory Visit (INDEPENDENT_AMBULATORY_CARE_PROVIDER_SITE_OTHER): Payer: Medicare Other | Admitting: Interventional Cardiology

## 2017-02-13 ENCOUNTER — Encounter: Payer: Self-pay | Admitting: Interventional Cardiology

## 2017-02-13 ENCOUNTER — Ambulatory Visit (INDEPENDENT_AMBULATORY_CARE_PROVIDER_SITE_OTHER): Payer: Medicare Other

## 2017-02-13 VITALS — BP 118/78 | HR 65 | Ht 69.0 in | Wt 190.0 lb

## 2017-02-13 DIAGNOSIS — I5042 Chronic combined systolic (congestive) and diastolic (congestive) heart failure: Secondary | ICD-10-CM | POA: Diagnosis not present

## 2017-02-13 DIAGNOSIS — I4891 Unspecified atrial fibrillation: Secondary | ICD-10-CM | POA: Diagnosis not present

## 2017-02-13 DIAGNOSIS — Z5181 Encounter for therapeutic drug level monitoring: Secondary | ICD-10-CM

## 2017-02-13 DIAGNOSIS — I2510 Atherosclerotic heart disease of native coronary artery without angina pectoris: Secondary | ICD-10-CM

## 2017-02-13 DIAGNOSIS — Z952 Presence of prosthetic heart valve: Secondary | ICD-10-CM | POA: Diagnosis not present

## 2017-02-13 DIAGNOSIS — I25810 Atherosclerosis of coronary artery bypass graft(s) without angina pectoris: Secondary | ICD-10-CM | POA: Diagnosis not present

## 2017-02-13 LAB — POCT INR: INR: 2.4

## 2017-02-13 NOTE — Patient Instructions (Signed)
Medication Instructions:  Your physician recommends that you continue on your current medications as directed. Please refer to the Current Medication list given to you today.   Labwork: None ordered  Testing/Procedures: None ordered  Follow-Up: Your physician recommends that you schedule a follow-up appointment in: April with Chanetta Marshall, NP per Dr. Jackalyn Lombard last [place] note.  Your physician wants you to follow-up in: October 2019 with Dr. Irish Lack. You will receive a reminder letter in the mail two months in advance. If you don't receive a letter, please call our office to schedule the follow-up appointment.   Any Other Special Instructions Will Be Listed Below (If Applicable).     If you need a refill on your cardiac medications before your next appointment, please call your pharmacy.

## 2017-02-13 NOTE — Patient Instructions (Signed)
Continue on same dosage 1 tablet everyday except 1/2 tablet on Mondays and Fridays.  Recheck in 3 weeks. Call us if any new medications are added or changed. [phone]

## 2017-02-13 NOTE — Progress Notes (Signed)
Cardiology Office Note   Date:  02/13/2017   ID:  [name], DOB 09/26/1925, MRN [id number]  PCP:  Wenda Low, MD    No chief complaint on file. AFib   Wt Readings from Last 3 Encounters:  02/13/17 190 lb (86.2 kg)  12/26/16 197 lb 12.8 oz (89.7 kg)  12/19/16 210 lb (95.3 kg)       History of Present Illness: [name] is a 83 y.o. male  with history of CAD status post CABG 1 in aVR in 2006. He has chronic atrial fibrillation CHADSVASC=5 on Coumadin and bradycardia treated with pacemaker in 2014, hypertension, DM type II.  Patient was hospitalized 08/2016 with sepsis question secondary to cellulitis.Also had acute on chronic CK D stage III creatinine on admission 2.01 was 1.48 and had slight pulmonary edema on chest x-ray. ACE inhibitor was held in the hospital   12/12/16 with significant CHF with weeping of his upper and lower extremities. 2-D echo 12/07/16 LVEF 60-65% with high ventricular filling pressures and moderate mitral stenosis and mild MR and normally functioning aortic valve. BNP was 1801. Creatinine 1.38. I stopped his Lasix and start torsemide 40 mg twice a day for 3 days and 40 mg daily. Weight was 220 pounds that date. I saw back 12/19/16 he was feeling a little better. He had lost 10 pounds in the first 3 days but then the swelling started to come back. Increased his Demadex to 40 mg twice a day. BNP that day was 1485 and creatinine was up to 1.62.    Swelling improved with diuretics.  Cr stable in November 2018.  He feels well.   Denies : Chest pain. Dizziness.  Nitroglycerin use. Orthopnea. Palpitations. Paroxysmal nocturnal dyspnea. Shortness of breath. Syncope.   Uses walker to avoid falls.    Past Medical History:  Diagnosis Date  . Aortic stenosis   . BPH (benign prostatic hypertrophy)   . Coronary artery disease   . Dermatophytosis of nail   . Diabetes mellitus without complication ([place])    type 2  . Diverticulosis   . DVT (deep  venous thrombosis) ([place])   . Gastroesophageal reflux disease   . GERD (gastroesophageal reflux disease)   . Gout   . 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 Kidney stones   . PAH (pulmonary artery hypertension) ([place])    PAS 63 on ECHO 09/2012  . Permanent atrial fibrillation ([place])   . PVD (peripheral vascular disease) ([place])   . Shortness of breath   . 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 AORTIC VALVE REPLACEMENT  2006  . BALLOON DILATION N/A 12/16/2013   Procedure: BALLOON DILATION;  Surgeon: Garlan Fair, MD;  Location: [facility] ENDOSCOPY;  Service: Endoscopy;  Laterality: N/A;  . CORONARY ARTERY BYPASS GRAFT    . ESOPHAGOGASTRODUODENOSCOPY N/A 12/16/2013   Procedure: ESOPHAGOGASTRODUODENOSCOPY (EGD);  Surgeon: Garlan Fair, MD;  Location: [facility];  Service: Endoscopy;  Laterality: N/A;  . HERNIA REPAIR    . PACEMAKER INSERTION  10-04-2012   Medtronic pacemaker implanted by Dr Rayann Heman for symptomatic bradycardia  . PARAESOPHAGEAL HERNIA REPAIR    . PERMANENT PACEMAKER INSERTION N/A 10/04/2012   Procedure: PERMANENT PACEMAKER INSERTION;  Surgeon: Thompson Grayer, MD;  Location[facility] CATH LAB;  Service: Cardiovascular;  Laterality: N/A;  . TEE WITHOUT CARDIOVERSION N/A 12/25/2014   Procedure: TRANSESOPHAGEAL ECHOCARDIOGRAM (  TEE)   (inpatient at [facility]);  Surgeon: Skeet Latch, MD;  Location: [facility] ENDOSCOPY;  Service: Cardiovascular;  Laterality: N/A;     Current Outpatient Medications  Medication Sig Dispense Refill  . acetaminophen (TYLENOL) 325 MG tablet Take 325-650 mg by mouth every 6 (six) hours as needed for mild pain or headache.    . Coenzyme Q10 (CO Q 10 PO) Take 1 capsule by mouth daily.    [name] glimepiride (AMARYL) 1 MG tablet Take 1 mg by mouth 2 (two) times daily.     . nitroGLYCERIN (NITROSTAT) 0.4 MG SL tablet Place 1 tablet (0.4 mg total) under the tongue every 5  (five) minutes x 3 doses as needed for chest pain. 25 tablet 6  . tamsulosin (FLOMAX) 0.4 MG CAPS capsule Take 1 capsule (0.4 mg total) by mouth daily. 30 capsule 0  . torsemide (DEMADEX) 20 MG tablet Take 2 tablets (40 mg total) by mouth 2 (two) times daily. 360 tablet 3  . warfarin (COUMADIN) 2.5 MG tablet Take 1 tablet (2.5 mg total) by mouth daily. 30 tablet 3   No current facility-administered medications for this visit.     Allergies:   Demerol [meperidine]    Social History:  The patient  reports that  has never smoked. he has never used smokeless tobacco. He reports that he does not drink alcohol or use drugs.   Family History:  The patient's family history includes Cancer - Other in his father and mother.    ROS:  Please see the history of present illness.   Otherwise, review of systems are positive for swelling improved.   All other systems are reviewed and negative.    PHYSICAL EXAM: VS:  BP 118/78 (BP Location: Right Arm, Patient Position: Sitting)   Pulse 65   Ht 5\' 9"  (1.753 m)   Wt 190 lb (86.2 kg)   SpO2 98%   BMI 28.06 kg/m  , BMI Body mass index is 28.06 kg/m. GEN: Well nourished, well developed, in no acute distress  HEENT: normal  Neck: no JVD, carotid bruits, or masses Cardiac: RRR; no murmurs, rubs, or gallops, 1+ bilateral pretibial edema  Respiratory:  clear to auscultation bilaterally, normal work of breathing GI: soft, nontender, nondistended, + BS MS: no deformity or atrophy  Skin: warm and dry, no rash Neuro:  Strength and sensation are intact Psych: euthymic mood, full affect     Recent Labs: 09/03/2016: TSH 5.825 09/04/2016: Magnesium 1.8 09/05/2016: Hemoglobin 11.9; Platelets 142 09/06/2016: ALT 14 12/26/2016: NT-Pro BNP 952 01/10/2017: BUN 30; Creatinine, Ser 1.66; Potassium 4.4; Sodium 140   Lipid Panel No results found for: CHOL, TRIG, HDL, CHOLHDL, VLDL, LDLCALC, LDLDIRECT   Other studies Reviewed: Additional studies/ records that were  reviewed today with results demonstrating: hospital records reviewed.  Labs reviewed. 10/18 echo showed EF 55-60%.  ASSESSMENT AND PLAN:  1. Chronic systolic and diastolic heart failure: He appears euvolemic.  Continue torsemide.  Would recommend BMet at next visit, if not already done by primary care. 2. AFib:  Pacer in placed.  Coumadin for stroke prevention.  No falls.  Arrange EP f/u in 4/18. 3. CAD: single vessel CABG in 2002.  He has avoided statins despite LDL above target.  4. S/p AVR: needs SBE prophylaxis.  Appears to be functioning well.    Current medicines are reviewed at length with the patient today.  The patient concerns regarding his medicines were addressed.  The following changes have been made:  No  change  Labs/ tests ordered today include:  No orders of the defined types were placed in this encounter.   Recommend 150 minutes/week of aerobic exercise Low fat, low carb, high fiber diet recommended  Disposition:   FU in in 10 months   Signed, Larae Grooms, MD  02/13/2017 4:19 PM    [facility] [address], [place], [place]  [address] Phone: [phone]; Fax: [phone]

## 2017-02-24 DIAGNOSIS — R05 Cough: Secondary | ICD-10-CM | POA: Diagnosis not present

## 2017-03-07 ENCOUNTER — Ambulatory Visit (INDEPENDENT_AMBULATORY_CARE_PROVIDER_SITE_OTHER): Payer: Medicare Other | Admitting: *Deleted

## 2017-03-07 DIAGNOSIS — Z5181 Encounter for therapeutic drug level monitoring: Secondary | ICD-10-CM

## 2017-03-07 DIAGNOSIS — I4891 Unspecified atrial fibrillation: Secondary | ICD-10-CM

## 2017-03-07 LAB — POCT INR: INR: 2

## 2017-03-07 NOTE — Patient Instructions (Signed)
Description   Today take 1.5 tablets, then Continue on same dosage 1 tablet everyday except 1/2 tablet on Mondays and Fridays.  Recheck in 4 weeks. Call us if any new medications are added or changed. [phone]

## 2017-04-04 ENCOUNTER — Ambulatory Visit (INDEPENDENT_AMBULATORY_CARE_PROVIDER_SITE_OTHER): Payer: Medicare Other | Admitting: *Deleted

## 2017-04-04 DIAGNOSIS — Z5181 Encounter for therapeutic drug level monitoring: Secondary | ICD-10-CM | POA: Diagnosis not present

## 2017-04-04 DIAGNOSIS — I4891 Unspecified atrial fibrillation: Secondary | ICD-10-CM | POA: Diagnosis not present

## 2017-04-04 DIAGNOSIS — Z952 Presence of prosthetic heart valve: Secondary | ICD-10-CM | POA: Diagnosis not present

## 2017-04-04 LAB — POCT INR: INR: 1.8

## 2017-04-04 NOTE — Patient Instructions (Signed)
Description   Today Feb 5th take coumadin 5mg  (1 peach tablet)  then continue on same dosage of coumadin 2.5mg  (1/2 tablet of peach tablet)  daily except 1.25mg  (1/2 tablet of green tablet)  on Mondays and Fridays   Recheck in 2 weeks. Call us if any new medications are added or changed. [phone]

## 2017-04-18 ENCOUNTER — Ambulatory Visit (INDEPENDENT_AMBULATORY_CARE_PROVIDER_SITE_OTHER): Payer: Medicare Other | Admitting: *Deleted

## 2017-04-18 DIAGNOSIS — Z952 Presence of prosthetic heart valve: Secondary | ICD-10-CM | POA: Diagnosis not present

## 2017-04-18 DIAGNOSIS — I4891 Unspecified atrial fibrillation: Secondary | ICD-10-CM

## 2017-04-18 DIAGNOSIS — Z5181 Encounter for therapeutic drug level monitoring: Secondary | ICD-10-CM

## 2017-04-18 LAB — POCT INR: INR: 1.9

## 2017-04-18 NOTE — Patient Instructions (Signed)
Description   Today Feb 19th take coumadin 5mg  (1 peach tablet)  then change coumadin dose to  2.5mg  (1/2 tablet of peach tablet)  daily except 1.25mg  (1/2 tablet of green tablet)  only on Fridays   Recheck in 2 weeks. Call us if any new medications are added or changed. [phone]

## 2017-05-02 ENCOUNTER — Ambulatory Visit (INDEPENDENT_AMBULATORY_CARE_PROVIDER_SITE_OTHER): Payer: Medicare Other | Admitting: Pharmacist

## 2017-05-02 DIAGNOSIS — I4891 Unspecified atrial fibrillation: Secondary | ICD-10-CM

## 2017-05-02 DIAGNOSIS — Z5181 Encounter for therapeutic drug level monitoring: Secondary | ICD-10-CM

## 2017-05-02 LAB — POCT INR: INR: 2.2

## 2017-05-02 NOTE — Patient Instructions (Signed)
Description   Continue taking 2.5mg  (1/2 tablet of peach tablet) daily except 1.25mg  (1/2 tablet of green tablet)  only on Fridays.  Recheck in 3-4 weeks. Call us if any new medications are added or changed. [phone]

## 2017-05-11 ENCOUNTER — Ambulatory Visit (INDEPENDENT_AMBULATORY_CARE_PROVIDER_SITE_OTHER): Payer: Medicare Other | Admitting: Podiatry

## 2017-05-11 ENCOUNTER — Encounter: Payer: Self-pay | Admitting: Podiatry

## 2017-05-11 DIAGNOSIS — B351 Tinea unguium: Secondary | ICD-10-CM

## 2017-05-11 DIAGNOSIS — D689 Coagulation defect, unspecified: Secondary | ICD-10-CM

## 2017-05-11 DIAGNOSIS — E1159 Type 2 diabetes mellitus with other circulatory complications: Secondary | ICD-10-CM

## 2017-05-11 DIAGNOSIS — M79609 Pain in unspecified limb: Secondary | ICD-10-CM | POA: Diagnosis not present

## 2017-05-11 NOTE — Progress Notes (Signed)
Complaint:  Visit Type: Patient returns to my office for continued preventative foot care services. Complaint: Patient states" my nails have grown long and thick and become painful to walk and wear shoes" Patient has been diagnosed with DM with no foot neuropathy.. The patient presents for preventative foot care services. No changes to ROS.  Patient is taking coumadin.  Podiatric Exam: Vascular: dorsalis pedis and posterior tibial pulses are not  palpable bilatera due to severe swelling both feet.. Capillary return is immediate. Temperature gradient is WNL. Skin turgor WNL  Sensorium: Diminished  Semmes Weinstein monofilament test. Normal tactile sensation bilaterally. Nail Exam: Pt has thick disfigured discolored nails with subungual debris noted bilateral entire nail hallux through fifth toenails Ulcer Exam: There is no evidence of ulcer or pre-ulcerative changes or infection. Orthopedic Exam: Muscle tone and strength are WNL. No limitations in general ROM. No crepitus or effusions noted. Foot type and digits show no abnormalities. Bony prominences are unremarkable. Skin: No Porokeratosis. No infection or ulcers  Diagnosis:  Onychomycosis, , Pain in right toe, pain in left toes  Treatment & Plan Procedures and Treatment: Consent by patient was obtained for treatment procedures. The patient understood the discussion of treatment and procedures well. All questions were answered thoroughly reviewed. Debridement of mycotic and hypertrophic toenails, 1 through 5 bilateral and clearing of subungual debris. No ulceration, no infection noted.  Return Visit-Office Procedure: Patient instructed to return to the office for a follow up visit 3 months for continued evaluation and treatment.    Gardiner Barefoot DPM

## 2017-05-26 ENCOUNTER — Ambulatory Visit (INDEPENDENT_AMBULATORY_CARE_PROVIDER_SITE_OTHER): Payer: Medicare Other | Admitting: *Deleted

## 2017-05-26 DIAGNOSIS — I4891 Unspecified atrial fibrillation: Secondary | ICD-10-CM

## 2017-05-26 DIAGNOSIS — Z5181 Encounter for therapeutic drug level monitoring: Secondary | ICD-10-CM | POA: Diagnosis not present

## 2017-05-26 LAB — POCT INR: INR: 1.9

## 2017-05-26 NOTE — Patient Instructions (Signed)
Description   Today take 2.5mg  ( 1 green tablet), then start taking  2.5mg  (1/2 tablet of peach tablet) everyday.   Recheck in 2 weeks. Call us if any new medications are added or changed. [phone]

## 2017-06-09 ENCOUNTER — Ambulatory Visit (INDEPENDENT_AMBULATORY_CARE_PROVIDER_SITE_OTHER): Payer: Medicare Other | Admitting: *Deleted

## 2017-06-09 DIAGNOSIS — Z5181 Encounter for therapeutic drug level monitoring: Secondary | ICD-10-CM

## 2017-06-09 DIAGNOSIS — I4891 Unspecified atrial fibrillation: Secondary | ICD-10-CM

## 2017-06-09 LAB — POCT INR: INR: 1.8

## 2017-06-09 NOTE — Patient Instructions (Signed)
Description   Today April 12th  take 5mg  ( 1 peach tablet), then start taking  2.5mg  (1/2 tablet of peach tablet) everyday.   Recheck in 2 weeks. Call us if any new medications are added or changed. [phone]

## 2017-06-22 ENCOUNTER — Ambulatory Visit (INDEPENDENT_AMBULATORY_CARE_PROVIDER_SITE_OTHER): Payer: Medicare Other | Admitting: *Deleted

## 2017-06-22 DIAGNOSIS — Z5181 Encounter for therapeutic drug level monitoring: Secondary | ICD-10-CM

## 2017-06-22 DIAGNOSIS — Z952 Presence of prosthetic heart valve: Secondary | ICD-10-CM | POA: Diagnosis not present

## 2017-06-22 DIAGNOSIS — I4891 Unspecified atrial fibrillation: Secondary | ICD-10-CM

## 2017-06-22 LAB — POCT INR: INR: 2.6

## 2017-06-22 NOTE — Patient Instructions (Signed)
Description   Continue taking  2.5mg   everyday.   Recheck in 2 weeks. Call us if any new medications are added or changed. [phone]

## 2017-07-05 ENCOUNTER — Ambulatory Visit (INDEPENDENT_AMBULATORY_CARE_PROVIDER_SITE_OTHER): Payer: Medicare Other | Admitting: *Deleted

## 2017-07-05 DIAGNOSIS — D225 Melanocytic nevi of trunk: Secondary | ICD-10-CM | POA: Diagnosis not present

## 2017-07-05 DIAGNOSIS — Z85828 Personal history of other malignant neoplasm of skin: Secondary | ICD-10-CM | POA: Diagnosis not present

## 2017-07-05 DIAGNOSIS — I4891 Unspecified atrial fibrillation: Secondary | ICD-10-CM

## 2017-07-05 DIAGNOSIS — Z5181 Encounter for therapeutic drug level monitoring: Secondary | ICD-10-CM | POA: Diagnosis not present

## 2017-07-05 DIAGNOSIS — L820 Inflamed seborrheic keratosis: Secondary | ICD-10-CM | POA: Diagnosis not present

## 2017-07-05 DIAGNOSIS — D485 Neoplasm of uncertain behavior of skin: Secondary | ICD-10-CM | POA: Diagnosis not present

## 2017-07-05 DIAGNOSIS — L814 Other melanin hyperpigmentation: Secondary | ICD-10-CM | POA: Diagnosis not present

## 2017-07-05 DIAGNOSIS — L821 Other seborrheic keratosis: Secondary | ICD-10-CM | POA: Diagnosis not present

## 2017-07-05 DIAGNOSIS — D1801 Hemangioma of skin and subcutaneous tissue: Secondary | ICD-10-CM | POA: Diagnosis not present

## 2017-07-05 DIAGNOSIS — L570 Actinic keratosis: Secondary | ICD-10-CM | POA: Diagnosis not present

## 2017-07-05 LAB — POCT INR: INR: 2.2

## 2017-07-05 NOTE — Patient Instructions (Addendum)
Description   Continue taking  2.5mg   everyday.   Recheck in 2 weeks. Call us if any new medications are added or changed. [phone]

## 2017-07-18 ENCOUNTER — Ambulatory Visit (INDEPENDENT_AMBULATORY_CARE_PROVIDER_SITE_OTHER): Payer: Medicare Other | Admitting: *Deleted

## 2017-07-18 DIAGNOSIS — Z5181 Encounter for therapeutic drug level monitoring: Secondary | ICD-10-CM | POA: Diagnosis not present

## 2017-07-18 DIAGNOSIS — I4891 Unspecified atrial fibrillation: Secondary | ICD-10-CM | POA: Diagnosis not present

## 2017-07-18 DIAGNOSIS — Z952 Presence of prosthetic heart valve: Secondary | ICD-10-CM | POA: Diagnosis not present

## 2017-07-18 LAB — POCT INR: INR: 2.6 (ref 2.0–3.0)

## 2017-07-18 NOTE — Patient Instructions (Addendum)
Description   Continue taking  2.5mg   everyday.   Recheck in 2 weeks. Call us if any new medications are added or changed. [phone]

## 2017-08-01 ENCOUNTER — Ambulatory Visit (INDEPENDENT_AMBULATORY_CARE_PROVIDER_SITE_OTHER): Payer: Medicare Other | Admitting: *Deleted

## 2017-08-01 ENCOUNTER — Encounter (INDEPENDENT_AMBULATORY_CARE_PROVIDER_SITE_OTHER): Payer: Self-pay

## 2017-08-01 DIAGNOSIS — I4891 Unspecified atrial fibrillation: Secondary | ICD-10-CM | POA: Diagnosis not present

## 2017-08-01 DIAGNOSIS — Z5181 Encounter for therapeutic drug level monitoring: Secondary | ICD-10-CM | POA: Diagnosis not present

## 2017-08-01 LAB — POCT INR: INR: 2.6 (ref 2.0–3.0)

## 2017-08-01 NOTE — Patient Instructions (Signed)
Description   Continue taking  2.5mg  everyday.   Recheck in 6 weeks. Call us if any new medications are added or changed. [phone]      

## 2017-08-14 ENCOUNTER — Encounter: Payer: Self-pay | Admitting: Podiatry

## 2017-08-14 ENCOUNTER — Ambulatory Visit (INDEPENDENT_AMBULATORY_CARE_PROVIDER_SITE_OTHER): Payer: Medicare Other | Admitting: Podiatry

## 2017-08-14 DIAGNOSIS — D689 Coagulation defect, unspecified: Secondary | ICD-10-CM

## 2017-08-14 DIAGNOSIS — M79609 Pain in unspecified limb: Secondary | ICD-10-CM

## 2017-08-14 DIAGNOSIS — B351 Tinea unguium: Secondary | ICD-10-CM

## 2017-08-14 DIAGNOSIS — E1159 Type 2 diabetes mellitus with other circulatory complications: Secondary | ICD-10-CM

## 2017-08-14 NOTE — Progress Notes (Signed)
Complaint:  Visit Type: Patient returns to my office for continued preventative foot care services. Complaint: Patient states" my nails have grown long and thick and become painful to walk and wear shoes" Patient has been diagnosed with DM with no foot neuropathy.. The patient presents for preventative foot care services. No changes to ROS.  Patient is taking coumadin.  Podiatric Exam: Vascular: dorsalis pedis and posterior tibial pulses are not  palpable bilatera due to severe swelling both feet.. Capillary return is immediate. Temperature gradient is WNL. Skin turgor WNL  Sensorium: Diminished  Semmes Weinstein monofilament test. Normal tactile sensation bilaterally. Nail Exam: Pt has thick disfigured discolored nails with subungual debris noted bilateral entire nail hallux through fifth toenails Ulcer Exam: There is no evidence of ulcer or pre-ulcerative changes or infection. Orthopedic Exam: Muscle tone and strength are WNL. No limitations in general ROM. No crepitus or effusions noted. Foot type and digits show no abnormalities. Bony prominences are unremarkable. Swelling  B/l. Skin: No Porokeratosis. No infection or ulcers  Diagnosis:  Onychomycosis, , Pain in right toe, pain in left toes  Treatment & Plan Procedures and Treatment: Consent by patient was obtained for treatment procedures. The patient understood the discussion of treatment and procedures well. All questions were answered thoroughly reviewed. Debridement of mycotic and hypertrophic toenails, 1 through 5 bilateral and clearing of subungual debris. No ulceration, no infection noted.  Return Visit-Office Procedure: Patient instructed to return to the office for a follow up visit 3 months for continued evaluation and treatment.    Kenai Fluegel DPM 

## 2017-09-05 ENCOUNTER — Ambulatory Visit (INDEPENDENT_AMBULATORY_CARE_PROVIDER_SITE_OTHER): Payer: Medicare Other | Admitting: *Deleted

## 2017-09-05 DIAGNOSIS — I4891 Unspecified atrial fibrillation: Secondary | ICD-10-CM

## 2017-09-05 DIAGNOSIS — Z5181 Encounter for therapeutic drug level monitoring: Secondary | ICD-10-CM | POA: Diagnosis not present

## 2017-09-05 DIAGNOSIS — Z952 Presence of prosthetic heart valve: Secondary | ICD-10-CM | POA: Diagnosis not present

## 2017-09-05 LAB — POCT INR: INR: 2.5 (ref 2.0–3.0)

## 2017-09-05 NOTE — Patient Instructions (Signed)
Description   Continue taking  2.5mg   everyday.   Recheck in 6 weeks. Call us if any new medications are added or changed. [phone]

## 2017-09-15 ENCOUNTER — Encounter: Payer: Self-pay | Admitting: Cardiology

## 2017-10-17 ENCOUNTER — Ambulatory Visit (INDEPENDENT_AMBULATORY_CARE_PROVIDER_SITE_OTHER): Payer: Medicare Other

## 2017-10-17 DIAGNOSIS — I4891 Unspecified atrial fibrillation: Secondary | ICD-10-CM

## 2017-10-17 DIAGNOSIS — Z5181 Encounter for therapeutic drug level monitoring: Secondary | ICD-10-CM

## 2017-10-17 LAB — POCT INR: INR: 2.2 (ref 2.0–3.0)

## 2017-10-17 NOTE — Patient Instructions (Signed)
Continue taking  2.5mg   everyday.   Recheck in 6 weeks. Call us if any new medications are added or changed. [phone]

## 2017-11-13 ENCOUNTER — Ambulatory Visit (INDEPENDENT_AMBULATORY_CARE_PROVIDER_SITE_OTHER): Payer: Medicare Other | Admitting: Podiatry

## 2017-11-13 ENCOUNTER — Encounter: Payer: Self-pay | Admitting: Podiatry

## 2017-11-13 DIAGNOSIS — D689 Coagulation defect, unspecified: Secondary | ICD-10-CM

## 2017-11-13 DIAGNOSIS — B351 Tinea unguium: Secondary | ICD-10-CM

## 2017-11-13 DIAGNOSIS — E1159 Type 2 diabetes mellitus with other circulatory complications: Secondary | ICD-10-CM

## 2017-11-13 DIAGNOSIS — M79609 Pain in unspecified limb: Secondary | ICD-10-CM

## 2017-11-13 NOTE — Progress Notes (Signed)
Complaint:  Visit Type: Patient returns to my office for continued preventative foot care services. Complaint: Patient states" my nails have grown long and thick and become painful to walk and wear shoes" Patient has been diagnosed with DM with no foot neuropathy.. The patient presents for preventative foot care services. No changes to ROS.  Patient is taking coumadin.  Podiatric Exam: Vascular: dorsalis pedis and posterior tibial pulses are not  palpable bilatera due to severe swelling both feet.. Capillary return is immediate. Temperature gradient is WNL. Skin turgor WNL  Sensorium: Diminished  Semmes Weinstein monofilament test. Normal tactile sensation bilaterally. Nail Exam: Pt has thick disfigured discolored nails with subungual debris noted bilateral entire nail hallux through fifth toenails Ulcer Exam: There is no evidence of ulcer or pre-ulcerative changes or infection. Orthopedic Exam: Muscle tone and strength are WNL. No limitations in general ROM. No crepitus or effusions noted. Foot type and digits show no abnormalities. Bony prominences are unremarkable. Swelling  B/l. Skin: No Porokeratosis. No infection or ulcers  Diagnosis:  Onychomycosis, , Pain in right toe, pain in left toes  Treatment & Plan Procedures and Treatment: Consent by patient was obtained for treatment procedures. The patient understood the discussion of treatment and procedures well. All questions were answered thoroughly reviewed. Debridement of mycotic and hypertrophic toenails, 1 through 5 bilateral and clearing of subungual debris. No ulceration, no infection noted.  Return Visit-Office Procedure: Patient instructed to return to the office for a follow up visit 3 months for continued evaluation and treatment.    Shakeira Rhee DPM 

## 2017-11-21 ENCOUNTER — Other Ambulatory Visit: Payer: Self-pay | Admitting: Interventional Cardiology

## 2017-11-23 ENCOUNTER — Ambulatory Visit: Payer: Medicare Other | Admitting: Interventional Cardiology

## 2017-11-28 ENCOUNTER — Ambulatory Visit (INDEPENDENT_AMBULATORY_CARE_PROVIDER_SITE_OTHER): Payer: Medicare Other | Admitting: Interventional Cardiology

## 2017-11-28 ENCOUNTER — Ambulatory Visit (INDEPENDENT_AMBULATORY_CARE_PROVIDER_SITE_OTHER): Payer: Medicare Other | Admitting: *Deleted

## 2017-11-28 ENCOUNTER — Encounter: Payer: Self-pay | Admitting: Interventional Cardiology

## 2017-11-28 VITALS — BP 110/80 | HR 78 | Ht 69.0 in | Wt 190.8 lb

## 2017-11-28 DIAGNOSIS — Z952 Presence of prosthetic heart valve: Secondary | ICD-10-CM

## 2017-11-28 DIAGNOSIS — I4891 Unspecified atrial fibrillation: Secondary | ICD-10-CM | POA: Diagnosis not present

## 2017-11-28 DIAGNOSIS — Z5181 Encounter for therapeutic drug level monitoring: Secondary | ICD-10-CM | POA: Diagnosis not present

## 2017-11-28 DIAGNOSIS — I5042 Chronic combined systolic (congestive) and diastolic (congestive) heart failure: Secondary | ICD-10-CM

## 2017-11-28 DIAGNOSIS — I2510 Atherosclerotic heart disease of native coronary artery without angina pectoris: Secondary | ICD-10-CM

## 2017-11-28 LAB — POCT INR: INR: 3.2 — AB (ref 2.0–3.0)

## 2017-11-28 NOTE — Patient Instructions (Signed)
Description   Skip tonight's dose, then Continue taking  2.5mg   everyday.   Recheck in 4 weeks. Call us if any new medications are added or changed. [phone]

## 2017-11-28 NOTE — Progress Notes (Signed)
Cardiology Office Note   Date:  11/28/2017   ID:  [name], DOB 1925-05-10, MRN [id number]  PCP:  Wenda Low, MD    No chief complaint on file.  CAD  Wt Readings from Last 3 Encounters:  11/28/17 190 lb 12.8 oz (86.5 kg)  02/13/17 190 lb (86.2 kg)  12/26/16 197 lb 12.8 oz (89.7 kg)       History of Present Illness: [name] is a 84 y.o. male  with history of CAD status post CABG 1 in aVR in 2006. He has chronic atrial fibrillation CHADSVASC=5 on Coumadin and bradycardia treated with pacemaker in 2014, hypertension, DM type II.  Patient was hospitalized 08/2016 with sepsis question secondary to cellulitis.Also had acute on chronic CK D stage III creatinine on admission 2.01 was 1.48 and had slight pulmonary edema on chest x-ray. ACE inhibitor was held in the hospital   12/12/16 with significant CHF with weeping of his upper and lower extremities. 2-D echo 12/07/16 LVEF 60-65% with high ventricular filling pressures and moderate mitral stenosis and mild MR and normally functioning aortic valve. BNP was 1801. Creatinine 1.38. I stopped his Lasix and start torsemide 40 mg twice a day for 3 days and 40 mg daily. Weight was 220 pounds that date. I saw back 12/19/16 he was feeling a little better. He had lost 10 pounds in the first 3 days but then the swelling started to come back. Increased his Demadex to 40 mg twice a day. BNP that day was 1485 and creatinine was up to 1.62.    Swelling improved with diuretics.  Cr stable in November 2018.      Past Medical History:  Diagnosis Date  . Aortic stenosis   . BPH (benign prostatic hypertrophy)   . Coronary artery disease   . Dermatophytosis of nail   . Diabetes mellitus without complication ([place])    type 2  . Diverticulosis   . DVT (deep venous thrombosis) ([place])   . Gastroesophageal reflux disease   . GERD (gastroesophageal reflux disease)   . Gout   . 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 Kidney stones     . PAH (pulmonary artery hypertension) ([place])    PAS 63 on ECHO 09/2012  . Permanent atrial fibrillation   . PVD (peripheral vascular disease) ([place])   . Shortness of breath   . 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 AORTIC VALVE REPLACEMENT  2006  . BALLOON DILATION N/A 12/16/2013   Procedure: BALLOON DILATION;  Surgeon: Garlan Fair, MD;  Location: [facility];  Service: Endoscopy;  Laterality: N/A;  . CORONARY ARTERY BYPASS GRAFT    . ESOPHAGOGASTRODUODENOSCOPY N/A 12/16/2013   Procedure: ESOPHAGOGASTRODUODENOSCOPY (EGD);  Surgeon: Garlan Fair, MD;  Location: [facility];  Service: Endoscopy;  Laterality: N/A;  . HERNIA REPAIR    . PACEMAKER INSERTION  10-04-2012   Medtronic pacemaker implanted by Dr Rayann Heman for symptomatic bradycardia  . PARAESOPHAGEAL HERNIA REPAIR    . PERMANENT PACEMAKER INSERTION N/A 10/04/2012   Procedure: PERMANENT PACEMAKER INSERTION;  Surgeon: Thompson Grayer, MD;  Location: [facility] CATH LAB;  Service: Cardiovascular;  Laterality: N/A;  . TEE WITHOUT CARDIOVERSION N/A 12/25/2014   Procedure: TRANSESOPHAGEAL ECHOCARDIOGRAM (TEE)   (inpatient at [facility]);  Surgeon: Skeet Latch, MD;  Location: [place];  Service: Cardiovascular;  Laterality: N/A;  Current Outpatient Medications  Medication Sig Dispense Refill  . acetaminophen (TYLENOL) 325 MG tablet Take 325-650 mg by mouth every 6 (six) hours as needed for mild pain or headache.    . Coenzyme Q10 (CO Q 10 PO) Take 1 capsule by mouth daily.    [name] glimepiride (AMARYL) 1 MG tablet Take 1 mg by mouth 2 (two) times daily.     . nitroGLYCERIN (NITROSTAT) 0.4 MG SL tablet Place 1 tablet (0.4 mg total) under the tongue every 5 (five) minutes x 3 doses as needed for chest pain. 25 tablet 6  . tamsulosin (FLOMAX) 0.4 MG CAPS capsule Take 1 capsule (0.4 mg total) by mouth daily. 30 capsule 0  . torsemide  (DEMADEX) 20 MG tablet Take 2 tablets (40 mg total) by mouth 2 (two) times daily. 360 tablet 3  . warfarin (COUMADIN) 2.5 MG tablet Take 1 tablet (2.5 mg total) by mouth daily. 30 tablet 3  . warfarin (COUMADIN) 5 MG tablet TAKE AS DIRECTED BY COUMADIN CLINIC. 30 tablet 2   No current facility-administered medications for this visit.     Allergies:   Demerol [meperidine]    Social History:  The patient  reports that he has never smoked. He has never used smokeless tobacco. He reports that he does not drink alcohol or use drugs.   Family History:  The patient's family history includes Cancer - Other in his father and mother.    ROS:  Please see the history of present illness.   Otherwise, review of systems are positive for leg swelling.   All other systems are reviewed and negative.    PHYSICAL EXAM: VS:  BP 110/80   Pulse 78   Ht 5\' 9"  (1.753 m)   Wt 190 lb 12.8 oz (86.5 kg)   SpO2 99%   BMI 28.18 kg/m  , BMI Body mass index is 28.18 kg/m. GEN: Well nourished, well developed, in no acute distress  HEENT: normal  Neck: no JVD, carotid bruits, or masses Cardiac: RRR; no murmurs, rubs, or gallops,; bilateral pitting LE edema  Respiratory:  clear to auscultation bilaterally, normal work of breathing GI: soft, nontender, nondistended, + BS MS: no deformity or atrophy ; brace on left leg Skin: warm and dry, no rash Neuro:  Strength and sensation are intact Psych: euthymic mood, full affect   EKG:   The ekg ordered today demonstrates ventricular paced rhythm   Recent Labs: 12/26/2016: NT-Pro BNP 952 01/10/2017: BUN 30; Creatinine, Ser 1.66; Potassium 4.4; Sodium 140   Lipid Panel No results found for: CHOL, TRIG, HDL, CHOLHDL, VLDL, LDLCALC, LDLDIRECT   Other studies Reviewed: Additional studies/ records that were reviewed today with results demonstrating: Cr 1.66 in 11/18.   ASSESSMENT AND PLAN:  1. CAD: single vessel CABG in 2002.  He is not interested in statins.   No angina on medical therapy.  2. AFib: Coumadin for stroke prevention. Needs EP f/u for pacer.  WIll arrange.  3. CHronic systolic/diastolic heart failure: Legs are swollen.  Component of venous insufficiency as well.  4. S/p AVR: COntinue SBE prophylaxis.  5. Labs to be checked with PMD in 11/18.  I stressed the importance of avoiding falls.  INR 3.2 today.  Coumadin dose adjusted.   Current medicines are reviewed at length with the patient today.  The patient concerns regarding his medicines were addressed.  The following changes have been made:  No change  Labs/ tests ordered today include:  No orders of the defined  types were placed in this encounter.   Recommend 150 minutes/week of aerobic exercise Low fat, low carb, high fiber diet recommended  Disposition:   FU in 9 months   Signed, Larae Grooms, MD  11/28/2017 9:35 AM    [facility] [address], [place], [place]  [address] Phone: [phone]; Fax: [phone]

## 2017-11-28 NOTE — Patient Instructions (Signed)
Medication Instructions:  Your physician recommends that you continue on your current medications as directed. Please refer to the Current Medication list given to you today.   Labwork: None ordered  Testing/Procedures: None ordered  Follow-Up: Your physician recommends that you schedule a follow-up appointment with Chanetta Marshall, NP   Your physician wants you to follow-up in: 9 months with Dr. Irish Lack. You will receive a reminder letter in the mail two months in advance. If you don't receive a letter, please call our office to schedule the follow-up appointment.   Any Other Special Instructions Will Be Listed Below (If Applicable).     If you need a refill on your cardiac medications before your next appointment, please call your pharmacy.

## 2017-12-20 NOTE — Progress Notes (Deleted)
Electrophysiology Office Note Date: 12/20/2017  ID:  Coltrane, Tugwell August 15, 1925, MRN [id number]  PCP: Wenda Low, MD Primary Cardiologist: Irish Lack Electrophysiologist: Allred  CC: Pacemaker follow-up  [name] is a 84 y.o. male seen today for Dr Rayann Heman.  He presents today for routine electrophysiology followup.  Since last being seen in our clinic, the patient reports doing very well.  He denies chest pain, palpitations, dyspnea, PND, orthopnea, nausea, vomiting, dizziness, syncope, edema, weight gain, or early satiety.  Device History: MDT single chamber PPM implanted 2014 for symptomatic bradycardia   Past Medical History:  Diagnosis Date  . Aortic stenosis   . BPH (benign prostatic hypertrophy)   . Coronary artery disease   . Dermatophytosis of nail   . Diabetes mellitus without complication ([place])    type 2  . Diverticulosis   . DVT (deep venous thrombosis) ([place])   . Gastroesophageal reflux disease   . GERD (gastroesophageal reflux disease)   . Gout   . 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 Kidney stones   . PAH (pulmonary artery hypertension) ([place])    PAS 63 on ECHO 09/2012  . Permanent atrial fibrillation   . PVD (peripheral vascular disease) ([place])   . Shortness of breath   . Symptomatic bradycardia 09-2012   s/p Medtronic pacemaker implanted by Dr Rayann Heman    Past Surgical History:  Procedure Laterality Date  . AORTIC VALVE REPLACEMENT  2006  . BALLOON DILATION N/A 12/16/2013   Procedure: BALLOON DILATION;  Surgeon: Garlan Fair, MD;  Location: [facility];  Service: Endoscopy;  Laterality: N/A;  . CORONARY ARTERY BYPASS GRAFT    . ESOPHAGOGASTRODUODENOSCOPY N/A 12/16/2013   Procedure: ESOPHAGOGASTRODUODENOSCOPY (EGD);  Surgeon: Garlan Fair, MD;  Location: [facility];  Service: Endoscopy;  Laterality: N/A;  . HERNIA REPAIR    . PACEMAKER INSERTION  10-04-2012   Medtronic pacemaker implanted by Dr Rayann Heman for symptomatic bradycardia    . PARAESOPHAGEAL HERNIA REPAIR    . PERMANENT PACEMAKER INSERTION N/A 10/04/2012   Procedure: PERMANENT PACEMAKER INSERTION;  Surgeon: Thompson Grayer, MD;  Location: [facility] CATH LAB;  Service: Cardiovascular;  Laterality: N/A;  . TEE WITHOUT CARDIOVERSION N/A 12/25/2014   Procedure: TRANSESOPHAGEAL ECHOCARDIOGRAM (TEE)   (inpatient at [facility]);  Surgeon: Skeet Latch, MD;  Location: [facility] ENDOSCOPY;  Service: Cardiovascular;  Laterality: N/A;    Current Outpatient Medications  Medication Sig Dispense Refill  . acetaminophen (TYLENOL) 325 MG tablet Take 325-650 mg by mouth every 6 (six) hours as needed for mild pain or headache.    . Coenzyme Q10 (CO Q 10 PO) Take 1 capsule by mouth daily.    [name] glimepiride (AMARYL) 1 MG tablet Take 1 mg by mouth 2 (two) times daily.     . nitroGLYCERIN (NITROSTAT) 0.4 MG SL tablet Place 1 tablet (0.4 mg total) under the tongue every 5 (five) minutes x 3 doses as needed for chest pain. 25 tablet 6  . tamsulosin (FLOMAX) 0.4 MG CAPS capsule Take 1 capsule (0.4 mg total) by mouth daily. 30 capsule 0  . torsemide (DEMADEX) 20 MG tablet Take 2 tablets (40 mg total) by mouth 2 (two) times daily. 360 tablet 3  . warfarin (COUMADIN) 2.5 MG tablet Take 1 tablet (2.5 mg total) by mouth daily. 30 tablet 3  . warfarin (COUMADIN) 5 MG tablet TAKE AS DIRECTED BY COUMADIN CLINIC. 30 tablet 2   No current facility-administered medications for this visit.  Allergies:   Demerol [meperidine]   Social History: Social History   Socioeconomic History  . Marital status: Married    Spouse name: Not on file  . Number of children: Not on file  . Years of education: Not on file  . Highest education level: Not on file  Occupational History  . Not on file  Social Needs  . Financial resource strain: Not on file  . Food insecurity:    Worry: Not on file    Inability: Not on file  . Transportation needs:    Medical: Not on file    Non-medical: Not on file  Tobacco Use  .  Smoking status: Never Smoker  . Smokeless tobacco: Never Used  Substance and Sexual Activity  . Alcohol use: No  . Drug use: No  . Sexual activity: Not on file  Lifestyle  . Physical activity:    Days per week: Not on file    Minutes per session: Not on file  . Stress: Not on file  Relationships  . Social connections:    Talks on phone: Not on file    Gets together: Not on file    Attends religious service: Not on file    Active member of club or organization: Not on file    Attends meetings of clubs or organizations: Not on file    Relationship status: Not on file  . Intimate partner violence:    Fear of current or ex partner: Not on file    Emotionally abused: Not on file    Physically abused: Not on file    Forced sexual activity: Not on file  Other Topics Concern  . Not on file  Social History Narrative  . Not on file    Family History: Family History  Problem Relation Age of Onset  . Cancer - Other Mother   . Cancer - Other Father   . CAD Neg Hx      Review of Systems: All other systems reviewed and are otherwise negative except as noted above.   Physical Exam: VS:  There were no vitals taken for this visit. , BMI There is no height or weight on file to calculate BMI.  GEN- The patient is well appearing, alert and oriented x 3 today.   HEENT: normocephalic, atraumatic; sclera clear, conjunctiva pink; hearing intact; oropharynx clear; neck supple  Lungs- Clear to ausculation bilaterally, normal work of breathing.  No wheezes, rales, rhonchi Heart- Regular rate and rhythm, no murmurs, rubs or gallops  GI- soft, non-tender, non-distended, bowel sounds present  Extremities- no clubbing, cyanosis, or edema  MS- no significant deformity or atrophy Skin- warm and dry, no rash or lesion; PPM pocket well healed Psych- euthymic mood, full affect Neuro- strength and sensation are intact  PPM Interrogation- reviewed in detail today,  See PACEART report  EKG:  EKG  is not ordered today.  Recent Labs: 12/26/2016: NT-Pro BNP 952 01/10/2017: BUN 30; Creatinine, Ser 1.66; Potassium 4.4; Sodium 140   Wt Readings from Last 3 Encounters:  11/28/17 190 lb 12.8 oz (86.5 kg)  02/13/17 190 lb (86.2 kg)  12/26/16 197 lb 12.8 oz (89.7 kg)     Other studies Reviewed: Additional studies/ records that were reviewed today include: Dr Irish Lack and Dr Jackalyn Lombard office notes   Assessment and Plan:  1.  Symptomatic bradycardia Normal PPM function See Pace Art report No changes today  2.  Permanent atrial fibrillation Continue Warfarin for CHADS2VASC of 5  3.  HTN Stable No change required today  4.  CAD s/p CABG No recent ischemic symptoms Continue medical therapy    Current medicines are reviewed at length with the patient today.   The patient does not have concerns regarding his medicines.  The following changes were made today:  none  Labs/ tests ordered today include: none No orders of the defined types were placed in this encounter.    Disposition:   Follow up with [facility], me in 1 year, Dr Irish Lack as scheduled     Signed, Chanetta Marshall, NP 12/20/2017 10:51 AM  [place] [address] [phone] (office) [phone] (fax)

## 2017-12-21 ENCOUNTER — Encounter: Payer: Medicare Other | Admitting: Nurse Practitioner

## 2017-12-25 ENCOUNTER — Other Ambulatory Visit: Payer: Self-pay | Admitting: Physician Assistant

## 2017-12-25 ENCOUNTER — Encounter: Payer: Self-pay | Admitting: Nurse Practitioner

## 2017-12-26 ENCOUNTER — Ambulatory Visit (INDEPENDENT_AMBULATORY_CARE_PROVIDER_SITE_OTHER): Payer: Medicare Other | Admitting: *Deleted

## 2017-12-26 DIAGNOSIS — I4891 Unspecified atrial fibrillation: Secondary | ICD-10-CM | POA: Diagnosis not present

## 2017-12-26 DIAGNOSIS — Z5181 Encounter for therapeutic drug level monitoring: Secondary | ICD-10-CM

## 2017-12-26 LAB — POCT INR: INR: 2.7 (ref 2.0–3.0)

## 2017-12-26 NOTE — Patient Instructions (Signed)
Description   Continue taking  2.5mg   everyday.   Recheck in 4 weeks. Call us if any new medications are added or changed. [phone]

## 2018-01-09 DIAGNOSIS — N400 Enlarged prostate without lower urinary tract symptoms: Secondary | ICD-10-CM | POA: Diagnosis not present

## 2018-01-09 DIAGNOSIS — M48061 Spinal stenosis, lumbar region without neurogenic claudication: Secondary | ICD-10-CM | POA: Diagnosis not present

## 2018-01-09 DIAGNOSIS — I25810 Atherosclerosis of coronary artery bypass graft(s) without angina pectoris: Secondary | ICD-10-CM | POA: Diagnosis not present

## 2018-01-09 DIAGNOSIS — M1990 Unspecified osteoarthritis, unspecified site: Secondary | ICD-10-CM | POA: Diagnosis not present

## 2018-01-09 DIAGNOSIS — I129 Hypertensive chronic kidney disease with stage 1 through stage 4 chronic kidney disease, or unspecified chronic kidney disease: Secondary | ICD-10-CM | POA: Diagnosis not present

## 2018-01-09 DIAGNOSIS — E7800 Pure hypercholesterolemia, unspecified: Secondary | ICD-10-CM | POA: Diagnosis not present

## 2018-01-09 DIAGNOSIS — I482 Chronic atrial fibrillation, unspecified: Secondary | ICD-10-CM | POA: Diagnosis not present

## 2018-01-09 DIAGNOSIS — E119 Type 2 diabetes mellitus without complications: Secondary | ICD-10-CM | POA: Diagnosis not present

## 2018-01-09 DIAGNOSIS — N183 Chronic kidney disease, stage 3 (moderate): Secondary | ICD-10-CM | POA: Diagnosis not present

## 2018-01-09 DIAGNOSIS — K219 Gastro-esophageal reflux disease without esophagitis: Secondary | ICD-10-CM | POA: Diagnosis not present

## 2018-01-09 DIAGNOSIS — E1122 Type 2 diabetes mellitus with diabetic chronic kidney disease: Secondary | ICD-10-CM | POA: Diagnosis not present

## 2018-01-09 DIAGNOSIS — Z0000 Encounter for general adult medical examination without abnormal findings: Secondary | ICD-10-CM | POA: Diagnosis not present

## 2018-01-09 DIAGNOSIS — M21372 Foot drop, left foot: Secondary | ICD-10-CM | POA: Diagnosis not present

## 2018-01-09 DIAGNOSIS — Z1389 Encounter for screening for other disorder: Secondary | ICD-10-CM | POA: Diagnosis not present

## 2018-01-09 DIAGNOSIS — Z23 Encounter for immunization: Secondary | ICD-10-CM | POA: Diagnosis not present

## 2018-01-23 ENCOUNTER — Ambulatory Visit (INDEPENDENT_AMBULATORY_CARE_PROVIDER_SITE_OTHER): Payer: Medicare Other | Admitting: Pharmacist

## 2018-01-23 DIAGNOSIS — Z5181 Encounter for therapeutic drug level monitoring: Secondary | ICD-10-CM

## 2018-01-23 DIAGNOSIS — I4891 Unspecified atrial fibrillation: Secondary | ICD-10-CM

## 2018-01-23 LAB — POCT INR: INR: 2 (ref 2.0–3.0)

## 2018-01-23 NOTE — Patient Instructions (Signed)
Description   Continue taking 1/2 tablet (2.5mg ) every day.  Recheck in 6 weeks. Call us if any new medications are added or changed. [phone]

## 2018-02-12 ENCOUNTER — Encounter: Payer: Self-pay | Admitting: Podiatry

## 2018-02-12 ENCOUNTER — Ambulatory Visit (INDEPENDENT_AMBULATORY_CARE_PROVIDER_SITE_OTHER): Payer: Medicare Other | Admitting: Podiatry

## 2018-02-12 DIAGNOSIS — M79676 Pain in unspecified toe(s): Secondary | ICD-10-CM | POA: Diagnosis not present

## 2018-02-12 DIAGNOSIS — E1159 Type 2 diabetes mellitus with other circulatory complications: Secondary | ICD-10-CM

## 2018-02-12 DIAGNOSIS — B351 Tinea unguium: Secondary | ICD-10-CM

## 2018-02-12 DIAGNOSIS — M79609 Pain in unspecified limb: Principal | ICD-10-CM

## 2018-02-12 DIAGNOSIS — D689 Coagulation defect, unspecified: Secondary | ICD-10-CM

## 2018-02-12 NOTE — Progress Notes (Signed)
Complaint:  Visit Type: Patient returns to my office for continued preventative foot care services. Complaint: Patient states" my nails have grown long and thick and become painful to walk and wear shoes" Patient has been diagnosed with DM with no foot neuropathy.. The patient presents for preventative foot care services. No changes to ROS.  Patient is taking coumadin.  Podiatric Exam: Vascular: dorsalis pedis and posterior tibial pulses are not  palpable bilatera due to severe swelling both feet.. Capillary return is immediate. Temperature gradient is WNL. Skin turgor WNL  Sensorium: Diminished  Semmes Weinstein monofilament test. Normal tactile sensation bilaterally. Nail Exam: Pt has thick disfigured discolored nails with subungual debris noted bilateral entire nail hallux through fifth toenails Ulcer Exam: There is no evidence of ulcer or pre-ulcerative changes or infection. Orthopedic Exam: Muscle tone and strength are WNL. No limitations in general ROM. No crepitus or effusions noted. Foot type and digits show no abnormalities. Bony prominences are unremarkable. Swelling  B/l. Skin: No Porokeratosis. No infection or ulcers  Diagnosis:  Onychomycosis, , Pain in right toe, pain in left toes  Treatment & Plan Procedures and Treatment: Consent by patient was obtained for treatment procedures. The patient understood the discussion of treatment and procedures well. All questions were answered thoroughly reviewed. Debridement of mycotic and hypertrophic toenails, 1 through 5 bilateral and clearing of subungual debris. No ulceration, no infection noted.  Return Visit-Office Procedure: Patient instructed to return to the office for a follow up visit 3 months for continued evaluation and treatment.    Torria Fromer DPM 

## 2018-02-15 ENCOUNTER — Other Ambulatory Visit: Payer: Self-pay | Admitting: Interventional Cardiology

## 2018-03-06 ENCOUNTER — Ambulatory Visit (INDEPENDENT_AMBULATORY_CARE_PROVIDER_SITE_OTHER): Payer: Medicare Other | Admitting: *Deleted

## 2018-03-06 DIAGNOSIS — I4891 Unspecified atrial fibrillation: Secondary | ICD-10-CM

## 2018-03-06 DIAGNOSIS — Z5181 Encounter for therapeutic drug level monitoring: Secondary | ICD-10-CM | POA: Diagnosis not present

## 2018-03-06 LAB — POCT INR: INR: 2.7 (ref 2.0–3.0)

## 2018-03-06 NOTE — Patient Instructions (Signed)
Description   Continue taking 1/2 tablet (2.5mg ) every day.  Recheck in 6 weeks. Call us if any new medications are added or changed. [phone]

## 2018-04-17 ENCOUNTER — Ambulatory Visit (INDEPENDENT_AMBULATORY_CARE_PROVIDER_SITE_OTHER): Payer: Medicare Other | Admitting: Pharmacist

## 2018-04-17 DIAGNOSIS — I4891 Unspecified atrial fibrillation: Secondary | ICD-10-CM | POA: Diagnosis not present

## 2018-04-17 DIAGNOSIS — Z5181 Encounter for therapeutic drug level monitoring: Secondary | ICD-10-CM

## 2018-04-17 LAB — POCT INR: INR: 3.5 — AB (ref 2.0–3.0)

## 2018-04-17 NOTE — Patient Instructions (Signed)
Skip coumadin tonight than then continue taking 1/2 tablet (2.5mg ) every day.  Recheck in 6 weeks. Call us if any new medications are added or changed. [phone]

## 2018-05-14 ENCOUNTER — Other Ambulatory Visit: Payer: Self-pay

## 2018-05-14 ENCOUNTER — Encounter: Payer: Self-pay | Admitting: Podiatry

## 2018-05-14 ENCOUNTER — Ambulatory Visit (INDEPENDENT_AMBULATORY_CARE_PROVIDER_SITE_OTHER): Payer: Medicare Other | Admitting: Podiatry

## 2018-05-14 DIAGNOSIS — M79676 Pain in unspecified toe(s): Secondary | ICD-10-CM | POA: Diagnosis not present

## 2018-05-14 DIAGNOSIS — B351 Tinea unguium: Secondary | ICD-10-CM | POA: Diagnosis not present

## 2018-05-14 DIAGNOSIS — D689 Coagulation defect, unspecified: Secondary | ICD-10-CM

## 2018-05-14 DIAGNOSIS — M79609 Pain in unspecified limb: Principal | ICD-10-CM

## 2018-05-14 DIAGNOSIS — E1159 Type 2 diabetes mellitus with other circulatory complications: Secondary | ICD-10-CM

## 2018-05-14 NOTE — Progress Notes (Signed)
Complaint:  Visit Type: Patient returns to my office for continued preventative foot care services. Complaint: Patient states" my nails have grown long and thick and become painful to walk and wear shoes" Patient has been diagnosed with DM with no foot neuropathy.. The patient presents for preventative foot care services. No changes to ROS.  Patient is taking coumadin.  Podiatric Exam: Vascular: dorsalis pedis and posterior tibial pulses are not  palpable bilatera due to severe swelling both feet.. Capillary return is immediate. Temperature gradient is WNL. Skin turgor WNL  Sensorium: Diminished  Semmes Weinstein monofilament test. Normal tactile sensation bilaterally. Nail Exam: Pt has thick disfigured discolored nails with subungual debris noted bilateral entire nail hallux through fifth toenails Ulcer Exam: There is no evidence of ulcer or pre-ulcerative changes or infection. Orthopedic Exam: Muscle tone and strength are WNL. No limitations in general ROM. No crepitus or effusions noted. Foot type and digits show no abnormalities. Bony prominences are unremarkable. Swelling  B/l. Skin: No Porokeratosis. No infection or ulcers  Diagnosis:  Onychomycosis, , Pain in right toe, pain in left toes  Treatment & Plan Procedures and Treatment: Consent by patient was obtained for treatment procedures. The patient understood the discussion of treatment and procedures well. All questions were answered thoroughly reviewed. Debridement of mycotic and hypertrophic toenails, 1 through 5 bilateral and clearing of subungual debris. No ulceration, no infection noted.  Return Visit-Office Procedure: Patient instructed to return to the office for a follow up visit 3 months for continued evaluation and treatment.    Shakisha Abend DPM 

## 2018-05-28 ENCOUNTER — Telehealth: Payer: Self-pay

## 2018-05-28 NOTE — Telephone Encounter (Signed)
LMOM FOR PRESCREEN/DRIVE THRU 

## 2018-05-29 ENCOUNTER — Ambulatory Visit (INDEPENDENT_AMBULATORY_CARE_PROVIDER_SITE_OTHER): Payer: Medicare Other | Admitting: Pharmacist

## 2018-05-29 ENCOUNTER — Other Ambulatory Visit: Payer: Self-pay

## 2018-05-29 DIAGNOSIS — I4891 Unspecified atrial fibrillation: Secondary | ICD-10-CM

## 2018-05-29 DIAGNOSIS — Z5181 Encounter for therapeutic drug level monitoring: Secondary | ICD-10-CM

## 2018-05-29 LAB — POCT INR: INR: 3.3 — AB (ref 2.0–3.0)

## 2018-06-11 ENCOUNTER — Telehealth: Payer: Self-pay | Admitting: *Deleted

## 2018-06-11 ENCOUNTER — Telehealth (INDEPENDENT_AMBULATORY_CARE_PROVIDER_SITE_OTHER): Payer: Medicare Other | Admitting: Internal Medicine

## 2018-06-11 DIAGNOSIS — I4821 Permanent atrial fibrillation: Secondary | ICD-10-CM

## 2018-06-11 DIAGNOSIS — R001 Bradycardia, unspecified: Secondary | ICD-10-CM

## 2018-06-11 DIAGNOSIS — I441 Atrioventricular block, second degree: Secondary | ICD-10-CM | POA: Diagnosis not present

## 2018-06-11 NOTE — Telephone Encounter (Signed)
Spoke with patient's wife regarding pt's PPM home monitor. Per wife, she is unsure if pt still has his old monitor (has not used since 2016). She is agreeable to having new monitor shipped to his home address. [place] phone number for monitor setup when it arrives in 7-10 business days. Pt's wife denies additional questions or concerns at this time.

## 2018-06-11 NOTE — Telephone Encounter (Signed)
New monitor ordered via [place] services. Should arrive in 4-7 business days. Will check back next week to ensure monitor received.

## 2018-06-11 NOTE — Progress Notes (Signed)
Electrophysiology TeleHealth Note   Due to national recommendations of social distancing due to [place] 19, an audio telehealth visit is felt to be most appropriate for this patient at this time.  Verbal consent was obtained from patient today.  He does not have a smart phone or Internet and is therefore unable to perform full virtual visit today.   Date:  06/11/2018   ID:  [name], DOB January 05, 1926, MRN [id number]  Location: patient's home  Provider location: [address] [place]  Evaluation Performed: Follow-up visit  PCP:  Wenda Low, MD  Cardiologist:  Dr Irish Lack Electrophysiologist:  Dr Rayann Heman  Chief Complaint:  afib  History of Present Illness:    [name] is a 85 y.o. male who presents via audio conferencing for a telehealth visit today.  Since last being seen in our clinic, the patient reports doing very well.  Today, he denies symptoms of palpitations, chest pain, shortness of breath,  lower extremity edema, dizziness, presyncope, or syncope.  The patient is otherwise without complaint today.  The patient denies symptoms of fevers, chills, cough, or new SOB worrisome for COVID 19.  Past Medical History:  Diagnosis Date  . Aortic stenosis   . BPH (benign prostatic hypertrophy)   . Coronary artery disease   . Dermatophytosis of nail   . Diabetes mellitus without complication ([place])    type 2  . Diverticulosis   . DVT (deep venous thrombosis) ([place])   . Gastroesophageal reflux disease   . GERD (gastroesophageal reflux disease)   . Gout   . 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 Kidney stones   . PAH (pulmonary artery hypertension) ([place])    PAS 63 on ECHO 09/2012  . Permanent atrial fibrillation   . PVD (peripheral vascular disease) ([place])   . Shortness of breath   . 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 AORTIC VALVE REPLACEMENT  2006   . BALLOON DILATION N/A 12/16/2013   Procedure: BALLOON DILATION;  Surgeon: Garlan Fair, MD;  Location: [facility] ENDOSCOPY;  Service: Endoscopy;  Laterality: N/A;  . CORONARY ARTERY BYPASS GRAFT    . ESOPHAGOGASTRODUODENOSCOPY N/A 12/16/2013   Procedure: ESOPHAGOGASTRODUODENOSCOPY (EGD);  Surgeon: Garlan Fair, MD;  Location: [facility] ENDOS[facility];  Service: Endoscopy;  Laterality: N/A;  . HERNIA REPAIR    . PACEMAKER INSERTION  10-04-2012   Medtronic pacemaker implanted by Dr Rayann Heman for symptomatic bradycardia  . PARAESOPHAGEAL HERNIA REPAIR    . PERMANENT PACEMAKER INSERTION N/A 10/04/2012   Procedure: PERMANENT PACEMAKER INSERTION;  Surgeon: Thompson Grayer, MD;  Location: [facility] CATH LAB;  Service: Cardiovascular;  Laterality: N/A;  . TEE WITHOUT CARDIOVERSION N/A 12/25/2014   Procedure: TRANSESOPHAGEAL ECHOCARDIOGRAM (TEE)   (inpatient at [facility]);  Surgeon: Skeet Latch, MD;  Location: [facility] ENDOSCOPY;  Service: Cardiovascular;  Laterality: N/A;    Current Outpatient Medications  Medication Sig Dispense Refill  . acetaminophen (TYLENOL) 325 MG tablet Take 325-650 mg by mouth every 6 (six) hours as needed for mild pain or headache.    . Coenzyme Q10 (CO Q 10 PO) Take 1 capsule by mouth daily.    . famotidine (PEPCID) 20 MG tablet     . glimepiride (AMARYL) 1 MG tablet Take 1 mg by mouth 2 (two) times daily.     . nitroGLYCERIN (NITROSTAT) 0.4 MG SL tablet Place 1  tablet (0.4 mg total) under the tongue every 5 (five) minutes x 3 doses as needed for chest pain. 25 tablet 6  . tamsulosin (FLOMAX) 0.4 MG CAPS capsule Take 1 capsule (0.4 mg total) by mouth daily. 30 capsule 0  . torsemide (DEMADEX) 20 MG tablet TAKE 2 TABLETS BY MOUTH TWICE A DAY 360 tablet 3  . warfarin (COUMADIN) 2.5 MG tablet Take 1 tablet (2.5 mg total) by mouth daily. 30 tablet 3  . warfarin (COUMADIN) 5 MG tablet TAKE AS DIRECTED BY COUMADIN CLINIC 90 tablet 1   No current facility-administered medications for this visit.      Allergies:   Demerol [meperidine]   Social History:  The patient  reports that he has never smoked. He has never used smokeless tobacco. He reports that he does not drink alcohol or use drugs.   Family History:  The patient's  family history includes Cancer - Other in his father and mother.   ROS:  Please see the history of present illness.   All other systems are personally reviewed and negative.    Exam:    Vital Signs:  There were no vitals taken for this visit.  Well sounding   Labs/Other Tests and Data Reviewed:    Recent Labs: No results found for requested labs within last 8760 hours.   Wt Readings from Last 3 Encounters:  11/28/17 190 lb 12.8 oz (86.5 kg)  02/13/17 190 lb (86.2 kg)  12/26/16 197 lb 12.8 oz (89.7 kg)     Other studies personally reviewed: Additional studies/ records that were reviewed today include: my prior notes,  Last device interrogation (2016)    Last device remote is reviewed from [facility] PDF dated 2016 which reveals normal device function, VVIR pacing   ASSESSMENT & PLAN:    1.  Second degree AV block Last interrogation 2018.  Importance of compliance with remotes was stressed to the patient today.  I have informed them that his battery status requires that we follow his device according to guidelines. They expressed willingness to comply  2. HTN He has not recently checked his BP at home I have advised that they do so  3. Permanent afib Rate controlled chads2vasc score is at least 5 He has also had prior DVT On coumadin, INRs are uptodate  4. CAD S/p single vessel CABG in 2002 No ischemic symptoms  5. S/p AVR Stable No change required today  6. COVID 19 screen The patient denies symptoms of COVID 19 at this time.  The importance of social distancing was discussed today.  Follow-up:  EP NP in 6 months Next remote: will need to re-establish compliance  Current medicines are reviewed at length with the patient today.   The  patient does not have concerns regarding his medicines.  The following changes were made today:  none  Labs/ tests ordered today include:  No orders of the defined types were placed in this encounter.  Patient Risk:  after full review of this patients clinical status, I feel that they are at moderate to high risk risk at this time.  Today, I have spent 12 minutes with the patient with telehealth technology discussing device management    Signed, Thompson Grayer, MD  06/11/2018 12:50 PM     [facility] [address] [place] [address] [phone] (office) [phone] (fax)

## 2018-06-12 ENCOUNTER — Telehealth: Payer: Self-pay

## 2018-06-12 NOTE — Telephone Encounter (Signed)
lmom for prescreen  

## 2018-06-13 ENCOUNTER — Ambulatory Visit (INDEPENDENT_AMBULATORY_CARE_PROVIDER_SITE_OTHER): Payer: Medicare Other | Admitting: Pharmacist

## 2018-06-13 ENCOUNTER — Other Ambulatory Visit: Payer: Self-pay

## 2018-06-13 DIAGNOSIS — I4891 Unspecified atrial fibrillation: Secondary | ICD-10-CM

## 2018-06-13 DIAGNOSIS — Z5181 Encounter for therapeutic drug level monitoring: Secondary | ICD-10-CM | POA: Diagnosis not present

## 2018-06-13 DIAGNOSIS — I4821 Permanent atrial fibrillation: Secondary | ICD-10-CM | POA: Diagnosis not present

## 2018-06-13 LAB — POCT INR: INR: 3.9 — AB (ref 2.0–3.0)

## 2018-06-13 MED ORDER — WARFARIN SODIUM 2.5 MG PO TABS: ORAL_TABLET | ORAL | 3 refills | Status: DC

## 2018-06-21 NOTE — Telephone Encounter (Signed)
LMOM requesting call back to DC. New monitor should have arrived at this point. Will assist with setup.

## 2018-06-27 NOTE — Telephone Encounter (Signed)
LM at both numbers requesting call back to DC. Gave direct number for return call.

## 2018-07-02 ENCOUNTER — Telehealth: Payer: Self-pay

## 2018-07-02 NOTE — Telephone Encounter (Signed)

## 2018-07-03 NOTE — Telephone Encounter (Signed)
LMOM advising I will try to catch patient during Coumadin Clinic visit tomorrow. Requested that they call back if they need help transmitting with patient's new monitor.

## 2018-07-03 NOTE — Telephone Encounter (Signed)
Pt wife returning call. 

## 2018-07-03 NOTE — Telephone Encounter (Signed)
Third attempt:  LM at both numbers requesting call back to DC. Gave direct number for return call.  Patient has an INR check tomorrow. Will try to speak with patient and wife while at the office for appointment.

## 2018-07-04 ENCOUNTER — Other Ambulatory Visit: Payer: Self-pay

## 2018-07-04 ENCOUNTER — Ambulatory Visit (INDEPENDENT_AMBULATORY_CARE_PROVIDER_SITE_OTHER): Payer: Medicare Other

## 2018-07-04 DIAGNOSIS — I4821 Permanent atrial fibrillation: Secondary | ICD-10-CM | POA: Diagnosis not present

## 2018-07-04 DIAGNOSIS — Z5181 Encounter for therapeutic drug level monitoring: Secondary | ICD-10-CM

## 2018-07-04 DIAGNOSIS — I4891 Unspecified atrial fibrillation: Secondary | ICD-10-CM

## 2018-07-04 LAB — POCT INR: INR: 2.7 (ref 2.0–3.0)

## 2018-07-04 NOTE — Patient Instructions (Signed)
Description   Spoke with patient's wife, advised to have pt continue on same dosage 1 tablet (2.5mg ) every day except 1/2 tablet (1.25mg ) on Saturdays.  Recheck in 4 weeks. Call us if any new medications are added or changed. [phone]

## 2018-07-04 NOTE — Telephone Encounter (Signed)
Pt daughter called and LMOVM.  I called pt daughter back. She asked for instructions on how to set up the monitor. I instructed her how to do this and that the monitor should stay plugged in at all times. Pt daughter verbalized understanding.

## 2018-07-04 NOTE — Telephone Encounter (Signed)
[facility] requesting call back for assistance setting up new monitor.

## 2018-07-06 NOTE — Telephone Encounter (Signed)
LMOVM for pt daughter to return call.  

## 2018-07-09 NOTE — Telephone Encounter (Signed)
Left a message to see if the pt got the monitor to work. I do not see a transmission in [facility] as of today. I gave my direct business number to help the pt with the new monitor.

## 2018-07-11 ENCOUNTER — Ambulatory Visit (INDEPENDENT_AMBULATORY_CARE_PROVIDER_SITE_OTHER): Payer: Medicare Other | Admitting: *Deleted

## 2018-07-11 DIAGNOSIS — I441 Atrioventricular block, second degree: Secondary | ICD-10-CM

## 2018-07-11 DIAGNOSIS — I4821 Permanent atrial fibrillation: Secondary | ICD-10-CM | POA: Diagnosis not present

## 2018-07-12 LAB — CUP PACEART REMOTE DEVICE CHECK
Battery Impedance: 2869 Ohm
Battery Remaining Longevity: 24 mo
Battery Voltage: 2.73 V
Brady Statistic RV Percent Paced: 99 %
Date Time Interrogation Session: 20200513175530
Implantable Lead Implant Date: 20140807
Implantable Lead Location: 753860
Implantable Lead Model: 5092
Implantable Pulse Generator Implant Date: 20140807
Lead Channel Impedance Value: 566 Ohm
Lead Channel Pacing Threshold Amplitude: 0.75 V
Lead Channel Pacing Threshold Pulse Width: 0.4 ms
Lead Channel Setting Pacing Amplitude: 2.5 V
Lead Channel Setting Pacing Pulse Width: 0.4 ms
Lead Channel Setting Sensing Sensitivity: 2 mV

## 2018-07-13 NOTE — Telephone Encounter (Signed)
Transmission received 07/11/2018

## 2018-07-30 ENCOUNTER — Other Ambulatory Visit: Payer: Self-pay

## 2018-07-30 ENCOUNTER — Telehealth: Payer: Self-pay

## 2018-07-30 NOTE — Progress Notes (Signed)
Remote pacemaker transmission.   

## 2018-07-30 NOTE — Telephone Encounter (Signed)
1. COVID-19 Pre-Screening Questions:  . In the past 7 to 10 days have you had a cough,  shortness of breath, headache, congestion, fever (100 or greater) body aches, chills, sore throat, or sudden loss of taste or sense of smell?no . Have you been around anyone with known Covid 19. . Have you been around anyone who is awaiting Covid 19 test results in the past 7 to 10 days?no . Have you been around anyone who has been exposed to Covid 19, or has mentioned symptoms of Covid 19 within the past 7 to 10 days?no   2. Pt advised of visitor restrictions (no visitors allowed except if needed to conduct the visit). Also advised to arrive at appointment time and wear a mask.  yes   

## 2018-08-01 ENCOUNTER — Other Ambulatory Visit: Payer: Self-pay

## 2018-08-01 ENCOUNTER — Ambulatory Visit (INDEPENDENT_AMBULATORY_CARE_PROVIDER_SITE_OTHER): Payer: Medicare Other | Admitting: *Deleted

## 2018-08-01 DIAGNOSIS — I4891 Unspecified atrial fibrillation: Secondary | ICD-10-CM | POA: Diagnosis not present

## 2018-08-01 DIAGNOSIS — Z5181 Encounter for therapeutic drug level monitoring: Secondary | ICD-10-CM | POA: Diagnosis not present

## 2018-08-01 LAB — POCT INR: INR: 2.7 (ref 2.0–3.0)

## 2018-08-01 NOTE — Patient Instructions (Signed)
Description   Continue on same dosage 1 tablet (2.5mg ) every day except 1/2 tablet (1.25mg ) on Saturdays.  Recheck in 4 weeks. Call us if any new medications are added or changed. [phone]

## 2018-08-13 ENCOUNTER — Encounter: Payer: Self-pay | Admitting: Podiatry

## 2018-08-13 ENCOUNTER — Other Ambulatory Visit: Payer: Self-pay

## 2018-08-13 ENCOUNTER — Ambulatory Visit (INDEPENDENT_AMBULATORY_CARE_PROVIDER_SITE_OTHER): Payer: Medicare Other | Admitting: Podiatry

## 2018-08-13 DIAGNOSIS — M79674 Pain in right toe(s): Secondary | ICD-10-CM

## 2018-08-13 DIAGNOSIS — M79675 Pain in left toe(s): Secondary | ICD-10-CM

## 2018-08-13 DIAGNOSIS — B351 Tinea unguium: Secondary | ICD-10-CM | POA: Diagnosis not present

## 2018-08-13 DIAGNOSIS — D6832 Hemorrhagic disorder due to extrinsic circulating anticoagulants: Secondary | ICD-10-CM

## 2018-08-13 DIAGNOSIS — T45515A Adverse effect of anticoagulants, initial encounter: Secondary | ICD-10-CM

## 2018-08-13 NOTE — Progress Notes (Signed)
Complaint:  Visit Type: Patient returns to my office for continued preventative foot care services. Complaint: Patient states" my nails have grown long and thick and become painful to walk and wear shoes" Patient has been diagnosed with DM with no foot neuropathy.. The patient presents for preventative foot care services. No changes to ROS.  Patient is taking coumadin.  Podiatric Exam: Vascular: dorsalis pedis and posterior tibial pulses are not  palpable bilatera due to severe swelling both feet.. Capillary return is immediate. Temperature gradient is WNL. Skin turgor WNL  Sensorium: Diminished  Semmes Weinstein monofilament test. Normal tactile sensation bilaterally. Nail Exam: Pt has thick disfigured discolored nails with subungual debris noted bilateral entire nail hallux through fifth toenails Ulcer Exam: There is no evidence of ulcer or pre-ulcerative changes or infection. Orthopedic Exam: Muscle tone and strength are WNL. No limitations in general ROM. No crepitus or effusions noted. Foot type and digits show no abnormalities. Bony prominences are unremarkable. Swelling  B/l. Skin: No Porokeratosis. No infection or ulcers  Diagnosis:  Onychomycosis, , Pain in right toe, pain in left toes  Treatment & Plan Procedures and Treatment: Consent by patient was obtained for treatment procedures. The patient understood the discussion of treatment and procedures well. All questions were answered thoroughly reviewed. Debridement of mycotic and hypertrophic toenails, 1 through 5 bilateral and clearing of subungual debris. No ulceration, no infection noted.  Return Visit-Office Procedure: Patient instructed to return to the office for a follow up visit 3 months for continued evaluation and treatment.    Gardiner Barefoot DPM

## 2018-08-22 ENCOUNTER — Telehealth: Payer: Self-pay

## 2018-08-22 NOTE — Telephone Encounter (Signed)
lmom for prescreen  

## 2018-08-28 ENCOUNTER — Telehealth: Payer: Self-pay | Admitting: *Deleted

## 2018-08-28 NOTE — Telephone Encounter (Signed)
1. COVID-19 Pre-Screening Questions:  . In the past 7 to 10 days have you had a cough,  shortness of breath, headache, congestion, fever (100 or greater) body aches, chills, sore throat, or sudden loss of taste or sense of smell? No . Have you been around anyone with known Covid 19? No  . Have you been around anyone who is awaiting Covid 19 test results in the past 7 to 10 days? No . Have you been around anyone who has been exposed to Covid 19, or has mentioned symptoms of Covid 19 within the past 7 to 10 days? No    2. Pt advised of visitor restrictions (no visitors allowed except if needed to conduct the visit). Also advised to arrive at appointment time and wear a mask.   Advised wife to be on time as limited appts and others have appts so arriving on time is warranted.

## 2018-08-29 ENCOUNTER — Other Ambulatory Visit: Payer: Self-pay

## 2018-08-29 ENCOUNTER — Ambulatory Visit (INDEPENDENT_AMBULATORY_CARE_PROVIDER_SITE_OTHER): Payer: Medicare Other | Admitting: *Deleted

## 2018-08-29 DIAGNOSIS — Z5181 Encounter for therapeutic drug level monitoring: Secondary | ICD-10-CM

## 2018-08-29 DIAGNOSIS — I4891 Unspecified atrial fibrillation: Secondary | ICD-10-CM

## 2018-08-29 LAB — POCT INR: INR: 2.2 (ref 2.0–3.0)

## 2018-08-29 NOTE — Patient Instructions (Signed)
Description   Continue on same dosage 1 tablet (2.5mg ) every day except 1/2 tablet (1.25mg ) on Saturdays.  Recheck in 5 weeks. Call us if any new medications are added or changed. [phone]

## 2018-09-05 ENCOUNTER — Other Ambulatory Visit: Payer: Self-pay | Admitting: Interventional Cardiology

## 2018-10-03 ENCOUNTER — Encounter (INDEPENDENT_AMBULATORY_CARE_PROVIDER_SITE_OTHER): Payer: Self-pay

## 2018-10-03 ENCOUNTER — Ambulatory Visit (INDEPENDENT_AMBULATORY_CARE_PROVIDER_SITE_OTHER): Payer: Medicare Other | Admitting: *Deleted

## 2018-10-03 ENCOUNTER — Other Ambulatory Visit: Payer: Self-pay

## 2018-10-03 DIAGNOSIS — Z5181 Encounter for therapeutic drug level monitoring: Secondary | ICD-10-CM | POA: Diagnosis not present

## 2018-10-03 DIAGNOSIS — I4891 Unspecified atrial fibrillation: Secondary | ICD-10-CM

## 2018-10-03 LAB — POCT INR: INR: 2 (ref 2.0–3.0)

## 2018-10-03 NOTE — Patient Instructions (Signed)
Description   Continue on same dosage 1 tablet (2.5mg ) every day except 1/2 tablet (1.25mg ) on Saturdays.  Recheck in 3 weeks. Call us if any new medications are added or changed. [phone]

## 2018-10-10 ENCOUNTER — Encounter: Payer: Medicare Other | Admitting: *Deleted

## 2018-10-18 ENCOUNTER — Encounter: Payer: Self-pay | Admitting: Cardiology

## 2018-10-25 ENCOUNTER — Ambulatory Visit (INDEPENDENT_AMBULATORY_CARE_PROVIDER_SITE_OTHER): Payer: Medicare Other

## 2018-10-25 ENCOUNTER — Other Ambulatory Visit: Payer: Self-pay

## 2018-10-25 DIAGNOSIS — I4891 Unspecified atrial fibrillation: Secondary | ICD-10-CM | POA: Diagnosis not present

## 2018-10-25 DIAGNOSIS — Z5181 Encounter for therapeutic drug level monitoring: Secondary | ICD-10-CM | POA: Diagnosis not present

## 2018-10-25 LAB — POCT INR: INR: 2.5 (ref 2.0–3.0)

## 2018-10-25 NOTE — Patient Instructions (Signed)
Description   Continue on same dosage 1 tablet (2.5mg) every day except 1/2 tablet (1.25mg) on Saturdays.  Recheck in 4 weeks. Call us if any new medications are added or changed. [phone]      

## 2018-11-12 ENCOUNTER — Ambulatory Visit (INDEPENDENT_AMBULATORY_CARE_PROVIDER_SITE_OTHER): Payer: Medicare Other | Admitting: Podiatry

## 2018-11-12 ENCOUNTER — Encounter: Payer: Self-pay | Admitting: Podiatry

## 2018-11-12 ENCOUNTER — Other Ambulatory Visit: Payer: Self-pay

## 2018-11-12 DIAGNOSIS — E1159 Type 2 diabetes mellitus with other circulatory complications: Secondary | ICD-10-CM | POA: Diagnosis not present

## 2018-11-12 DIAGNOSIS — M79675 Pain in left toe(s): Secondary | ICD-10-CM | POA: Diagnosis not present

## 2018-11-12 DIAGNOSIS — M79674 Pain in right toe(s): Secondary | ICD-10-CM

## 2018-11-12 DIAGNOSIS — D689 Coagulation defect, unspecified: Secondary | ICD-10-CM | POA: Diagnosis not present

## 2018-11-12 DIAGNOSIS — B351 Tinea unguium: Secondary | ICD-10-CM

## 2018-11-12 NOTE — Progress Notes (Signed)
Complaint:  Visit Type: Patient returns to my office for continued preventative foot care services. Complaint: Patient states" my nails have grown long and thick and become painful to walk and wear shoes" Patient has been diagnosed with DM with no foot neuropathy.. The patient presents for preventative foot care services. No changes to ROS.  Patient is taking coumadin.  Podiatric Exam: Vascular: dorsalis pedis and posterior tibial pulses are not  palpable bilatera due to severe swelling both feet.. Capillary return is immediate. Temperature gradient is WNL. Skin turgor WNL  Sensorium: Diminished  Semmes Weinstein monofilament test. Normal tactile sensation bilaterally. Nail Exam: Pt has thick disfigured discolored nails with subungual debris noted bilateral entire nail hallux through fifth toenails Ulcer Exam: There is no evidence of ulcer or pre-ulcerative changes or infection. Orthopedic Exam: Muscle tone and strength are WNL. No limitations in general ROM. No crepitus or effusions noted. Foot type and digits show no abnormalities. Bony prominences are unremarkable. Swelling  B/l. Skin: No Porokeratosis. No infection or ulcers  Diagnosis:  Onychomycosis, , Pain in right toe, pain in left toes  Treatment & Plan Procedures and Treatment: Consent by patient was obtained for treatment procedures. The patient understood the discussion of treatment and procedures well. All questions were answered thoroughly reviewed. Debridement of mycotic and hypertrophic toenails, 1 through 5 bilateral and clearing of subungual debris. No ulceration, no infection noted.  Return Visit-Office Procedure: Patient instructed to return to the office for a follow up visit 3 months for continued evaluation and treatment.    Versie Fleener DPM 

## 2018-11-22 ENCOUNTER — Ambulatory Visit (INDEPENDENT_AMBULATORY_CARE_PROVIDER_SITE_OTHER): Payer: Medicare Other

## 2018-11-22 ENCOUNTER — Other Ambulatory Visit: Payer: Self-pay

## 2018-11-22 DIAGNOSIS — I4891 Unspecified atrial fibrillation: Secondary | ICD-10-CM | POA: Diagnosis not present

## 2018-11-22 DIAGNOSIS — Z5181 Encounter for therapeutic drug level monitoring: Secondary | ICD-10-CM

## 2018-11-22 LAB — POCT INR: INR: 2.5 (ref 2.0–3.0)

## 2018-11-22 NOTE — Patient Instructions (Signed)
Description   Continue on same dosage 1 tablet (2.5mg) every day except 1/2 tablet (1.25mg) on Saturdays.  Recheck in 6 weeks. Call us if any new medications are added or changed. [phone]      

## 2018-12-14 ENCOUNTER — Other Ambulatory Visit: Payer: Self-pay | Admitting: Physician Assistant

## 2019-01-03 ENCOUNTER — Ambulatory Visit (INDEPENDENT_AMBULATORY_CARE_PROVIDER_SITE_OTHER): Payer: Medicare Other | Admitting: *Deleted

## 2019-01-03 ENCOUNTER — Other Ambulatory Visit: Payer: Self-pay

## 2019-01-03 DIAGNOSIS — Z5181 Encounter for therapeutic drug level monitoring: Secondary | ICD-10-CM | POA: Diagnosis not present

## 2019-01-03 DIAGNOSIS — I4891 Unspecified atrial fibrillation: Secondary | ICD-10-CM | POA: Diagnosis not present

## 2019-01-03 LAB — POCT INR: INR: 2 (ref 2.0–3.0)

## 2019-01-03 NOTE — Patient Instructions (Signed)
Description   Continue on same dosage 1 tablet (2.5mg) every day except 1/2 tablet (1.25mg) on Saturdays.  Recheck in 6 weeks. Call us if any new medications are added or changed. [phone]      

## 2019-01-11 DIAGNOSIS — Z23 Encounter for immunization: Secondary | ICD-10-CM | POA: Diagnosis not present

## 2019-02-07 DIAGNOSIS — H25813 Combined forms of age-related cataract, bilateral: Secondary | ICD-10-CM | POA: Diagnosis not present

## 2019-02-07 DIAGNOSIS — H52221 Regular astigmatism, right eye: Secondary | ICD-10-CM | POA: Diagnosis not present

## 2019-02-07 DIAGNOSIS — H524 Presbyopia: Secondary | ICD-10-CM | POA: Diagnosis not present

## 2019-02-07 DIAGNOSIS — H5201 Hypermetropia, right eye: Secondary | ICD-10-CM | POA: Diagnosis not present

## 2019-02-07 DIAGNOSIS — H26492 Other secondary cataract, left eye: Secondary | ICD-10-CM | POA: Diagnosis not present

## 2019-02-07 DIAGNOSIS — Z7984 Long term (current) use of oral hypoglycemic drugs: Secondary | ICD-10-CM | POA: Diagnosis not present

## 2019-02-07 DIAGNOSIS — Z961 Presence of intraocular lens: Secondary | ICD-10-CM | POA: Diagnosis not present

## 2019-02-07 DIAGNOSIS — E119 Type 2 diabetes mellitus without complications: Secondary | ICD-10-CM | POA: Diagnosis not present

## 2019-02-11 ENCOUNTER — Other Ambulatory Visit: Payer: Self-pay

## 2019-02-11 ENCOUNTER — Ambulatory Visit (INDEPENDENT_AMBULATORY_CARE_PROVIDER_SITE_OTHER): Payer: Medicare Other | Admitting: Podiatry

## 2019-02-11 ENCOUNTER — Encounter: Payer: Self-pay | Admitting: Podiatry

## 2019-02-11 DIAGNOSIS — M79675 Pain in left toe(s): Secondary | ICD-10-CM

## 2019-02-11 DIAGNOSIS — E1159 Type 2 diabetes mellitus with other circulatory complications: Secondary | ICD-10-CM

## 2019-02-11 DIAGNOSIS — M79674 Pain in right toe(s): Secondary | ICD-10-CM | POA: Diagnosis not present

## 2019-02-11 DIAGNOSIS — B351 Tinea unguium: Secondary | ICD-10-CM

## 2019-02-11 DIAGNOSIS — D689 Coagulation defect, unspecified: Secondary | ICD-10-CM

## 2019-02-11 NOTE — Progress Notes (Signed)
Complaint:  Visit Type: Patient returns to my office for continued preventative foot care services. Complaint: Patient states" my nails have grown long and thick and become painful to walk and wear shoes" Patient has been diagnosed with DM with no foot neuropathy.. The patient presents for preventative foot care services. No changes to ROS.  Patient is taking coumadin.  Podiatric Exam: Vascular: dorsalis pedis and posterior tibial pulses are not  palpable bilatera due to severe swelling both feet.. Capillary return is immediate. Temperature gradient is WNL. Skin turgor WNL  Sensorium: Diminished  Semmes Weinstein monofilament test. Normal tactile sensation bilaterally. Nail Exam: Pt has thick disfigured discolored nails with subungual debris noted bilateral entire nail hallux through fifth toenails Ulcer Exam: There is no evidence of ulcer or pre-ulcerative changes or infection. Orthopedic Exam: Muscle tone and strength are WNL. No limitations in general ROM. No crepitus or effusions noted. Foot type and digits show no abnormalities. Bony prominences are unremarkable. Swelling  B/l. Skin: No Porokeratosis. No infection or ulcers  Diagnosis:  Onychomycosis, , Pain in right toe, pain in left toes  Treatment & Plan Procedures and Treatment: Consent by patient was obtained for treatment procedures. The patient understood the discussion of treatment and procedures well. All questions were answered thoroughly reviewed. Debridement of mycotic and hypertrophic toenails, 1 through 5 bilateral and clearing of subungual debris. No ulceration, no infection noted. Cauterize second toenail left foot. Return Visit-Office Procedure: Patient instructed to return to the office for a follow up visit 3 months for continued evaluation and treatment.    Gardiner Barefoot DPM

## 2019-02-14 ENCOUNTER — Ambulatory Visit (INDEPENDENT_AMBULATORY_CARE_PROVIDER_SITE_OTHER): Payer: Medicare Other | Admitting: *Deleted

## 2019-02-14 ENCOUNTER — Other Ambulatory Visit: Payer: Self-pay

## 2019-02-14 DIAGNOSIS — I4891 Unspecified atrial fibrillation: Secondary | ICD-10-CM

## 2019-02-14 DIAGNOSIS — Z5181 Encounter for therapeutic drug level monitoring: Secondary | ICD-10-CM | POA: Diagnosis not present

## 2019-02-14 LAB — POCT INR: INR: 2.6 (ref 2.0–3.0)

## 2019-02-14 NOTE — Patient Instructions (Signed)
Description   Continue on same dosage 1 tablet (2.5mg ) every day except 1/2 tablet (1.25mg ) on Saturdays.  Recheck in 6 weeks. Call us if any new medications are added or changed. [phone]

## 2019-03-06 ENCOUNTER — Telehealth: Payer: Self-pay

## 2019-03-06 NOTE — Telephone Encounter (Signed)
Unable to speak  with patient to remind of missed remote transmission 

## 2019-03-16 ENCOUNTER — Other Ambulatory Visit: Payer: Self-pay | Admitting: Interventional Cardiology

## 2019-03-28 ENCOUNTER — Other Ambulatory Visit: Payer: Self-pay

## 2019-03-28 ENCOUNTER — Encounter (INDEPENDENT_AMBULATORY_CARE_PROVIDER_SITE_OTHER): Payer: Self-pay

## 2019-03-28 ENCOUNTER — Ambulatory Visit (INDEPENDENT_AMBULATORY_CARE_PROVIDER_SITE_OTHER): Payer: Medicare Other

## 2019-03-28 DIAGNOSIS — Z5181 Encounter for therapeutic drug level monitoring: Secondary | ICD-10-CM | POA: Diagnosis not present

## 2019-03-28 DIAGNOSIS — I4891 Unspecified atrial fibrillation: Secondary | ICD-10-CM

## 2019-03-28 LAB — POCT INR: INR: 2.3 (ref 2.0–3.0)

## 2019-03-28 NOTE — Patient Instructions (Signed)
Description   Continue on same dosage 1 tablet (2.5mg ) every day except 1/2 tablet (1.25mg ) on Saturdays.  Recheck in 6 weeks. Call us if any new medications are added or changed. [phone]

## 2019-04-12 ENCOUNTER — Ambulatory Visit (INDEPENDENT_AMBULATORY_CARE_PROVIDER_SITE_OTHER): Payer: Medicare Other | Admitting: *Deleted

## 2019-04-12 DIAGNOSIS — I4821 Permanent atrial fibrillation: Secondary | ICD-10-CM

## 2019-04-13 LAB — CUP PACEART REMOTE DEVICE CHECK
Battery Impedance: 3307 Ohm
Battery Remaining Longevity: 19 mo
Battery Voltage: 2.71 V
Brady Statistic RV Percent Paced: 99 %
Date Time Interrogation Session: 20210212152654
Implantable Lead Implant Date: 20140807
Implantable Lead Location: 753860
Implantable Lead Model: 5092
Implantable Pulse Generator Implant Date: 20140807
Lead Channel Impedance Value: 0 Ohm
Lead Channel Impedance Value: 542 Ohm
Lead Channel Pacing Threshold Amplitude: 0.625 V
Lead Channel Pacing Threshold Pulse Width: 0.4 ms
Lead Channel Setting Pacing Amplitude: 2.5 V
Lead Channel Setting Pacing Pulse Width: 0.4 ms
Lead Channel Setting Sensing Sensitivity: 2 mV

## 2019-04-15 NOTE — Progress Notes (Signed)
PPM Remote  

## 2019-05-06 DIAGNOSIS — E7800 Pure hypercholesterolemia, unspecified: Secondary | ICD-10-CM | POA: Diagnosis not present

## 2019-05-06 DIAGNOSIS — I10 Essential (primary) hypertension: Secondary | ICD-10-CM | POA: Diagnosis not present

## 2019-05-06 DIAGNOSIS — Z1389 Encounter for screening for other disorder: Secondary | ICD-10-CM | POA: Diagnosis not present

## 2019-05-06 DIAGNOSIS — N1832 Chronic kidney disease, stage 3b: Secondary | ICD-10-CM | POA: Diagnosis not present

## 2019-05-06 DIAGNOSIS — K222 Esophageal obstruction: Secondary | ICD-10-CM | POA: Diagnosis not present

## 2019-05-06 DIAGNOSIS — E0842 Diabetes mellitus due to underlying condition with diabetic polyneuropathy: Secondary | ICD-10-CM | POA: Diagnosis not present

## 2019-05-06 DIAGNOSIS — Z0000 Encounter for general adult medical examination without abnormal findings: Secondary | ICD-10-CM | POA: Diagnosis not present

## 2019-05-06 DIAGNOSIS — N400 Enlarged prostate without lower urinary tract symptoms: Secondary | ICD-10-CM | POA: Diagnosis not present

## 2019-05-06 DIAGNOSIS — E1122 Type 2 diabetes mellitus with diabetic chronic kidney disease: Secondary | ICD-10-CM | POA: Diagnosis not present

## 2019-05-06 DIAGNOSIS — K219 Gastro-esophageal reflux disease without esophagitis: Secondary | ICD-10-CM | POA: Diagnosis not present

## 2019-05-06 DIAGNOSIS — M48061 Spinal stenosis, lumbar region without neurogenic claudication: Secondary | ICD-10-CM | POA: Diagnosis not present

## 2019-05-06 DIAGNOSIS — I25810 Atherosclerosis of coronary artery bypass graft(s) without angina pectoris: Secondary | ICD-10-CM | POA: Diagnosis not present

## 2019-05-06 DIAGNOSIS — M21372 Foot drop, left foot: Secondary | ICD-10-CM | POA: Diagnosis not present

## 2019-05-06 DIAGNOSIS — I482 Chronic atrial fibrillation, unspecified: Secondary | ICD-10-CM | POA: Diagnosis not present

## 2019-05-09 ENCOUNTER — Ambulatory Visit (INDEPENDENT_AMBULATORY_CARE_PROVIDER_SITE_OTHER): Payer: Medicare Other | Admitting: *Deleted

## 2019-05-09 ENCOUNTER — Other Ambulatory Visit: Payer: Self-pay

## 2019-05-09 DIAGNOSIS — Z5181 Encounter for therapeutic drug level monitoring: Secondary | ICD-10-CM | POA: Diagnosis not present

## 2019-05-09 DIAGNOSIS — I4891 Unspecified atrial fibrillation: Secondary | ICD-10-CM

## 2019-05-09 LAB — POCT INR: INR: 3.3 — AB (ref 2.0–3.0)

## 2019-05-09 NOTE — Patient Instructions (Signed)
Description   Hold today and then continue on same dosage 1 tablet (2.5mg ) every day except 1/2 tablet (1.25mg ) on Saturdays.  Recheck in 3 weeks. Call us if any new medications are added or changed. [phone]

## 2019-05-13 ENCOUNTER — Encounter: Payer: Self-pay | Admitting: Podiatry

## 2019-05-13 ENCOUNTER — Ambulatory Visit (INDEPENDENT_AMBULATORY_CARE_PROVIDER_SITE_OTHER): Payer: Medicare Other | Admitting: Podiatry

## 2019-05-13 ENCOUNTER — Other Ambulatory Visit: Payer: Self-pay

## 2019-05-13 VITALS — Temp 96.7°F

## 2019-05-13 DIAGNOSIS — M79675 Pain in left toe(s): Secondary | ICD-10-CM | POA: Diagnosis not present

## 2019-05-13 DIAGNOSIS — M79674 Pain in right toe(s): Secondary | ICD-10-CM

## 2019-05-13 DIAGNOSIS — B351 Tinea unguium: Secondary | ICD-10-CM | POA: Diagnosis not present

## 2019-05-13 DIAGNOSIS — E1159 Type 2 diabetes mellitus with other circulatory complications: Secondary | ICD-10-CM

## 2019-05-13 DIAGNOSIS — D689 Coagulation defect, unspecified: Secondary | ICD-10-CM

## 2019-05-13 NOTE — Progress Notes (Signed)
He presents today chief complaint of painfully elongated toenails.  Objective: Pulses are nonpalpable capillary fill time is immediate considerable swelling left lower extremity secondary to dropfoot and venous insufficiency.  No open lesions or wounds.  Toenails are long thick yellow dystrophic-like mycotic painful.  Assessment: Debridement of toenails 1 through 5 bilateral.  Plan: Follow-up with me as needed.

## 2019-05-29 ENCOUNTER — Ambulatory Visit (INDEPENDENT_AMBULATORY_CARE_PROVIDER_SITE_OTHER): Payer: Medicare Other | Admitting: *Deleted

## 2019-05-29 ENCOUNTER — Other Ambulatory Visit: Payer: Self-pay

## 2019-05-29 DIAGNOSIS — I4891 Unspecified atrial fibrillation: Secondary | ICD-10-CM

## 2019-05-29 DIAGNOSIS — Z5181 Encounter for therapeutic drug level monitoring: Secondary | ICD-10-CM

## 2019-05-29 LAB — POCT INR: INR: 2.4 (ref 2.0–3.0)

## 2019-05-29 NOTE — Patient Instructions (Signed)
Description   Continue on same dosage 1 tablet (2.5mg ) every day except 1/2 tablet (1.25mg ) on Saturdays.  Recheck in 4 weeks. Call us if any new medications are added or changed. [phone]

## 2019-06-19 ENCOUNTER — Other Ambulatory Visit: Payer: Self-pay | Admitting: Physician Assistant

## 2019-06-20 ENCOUNTER — Ambulatory Visit (INDEPENDENT_AMBULATORY_CARE_PROVIDER_SITE_OTHER): Payer: Medicare Other

## 2019-06-20 ENCOUNTER — Other Ambulatory Visit: Payer: Self-pay

## 2019-06-20 DIAGNOSIS — Z5181 Encounter for therapeutic drug level monitoring: Secondary | ICD-10-CM | POA: Diagnosis not present

## 2019-06-20 DIAGNOSIS — I4891 Unspecified atrial fibrillation: Secondary | ICD-10-CM

## 2019-06-20 LAB — POCT INR: INR: 2.5 (ref 2.0–3.0)

## 2019-06-20 NOTE — Patient Instructions (Signed)
Description   Continue on same dosage 1 tablet (2.5mg ) every day except 1/2 tablet (1.25mg ) on Saturdays.  Recheck in 6 weeks. Call us if any new medications are added or changed. [phone]

## 2019-06-23 NOTE — Progress Notes (Deleted)
Cardiology Office Note   Date:  06/23/2019   ID:  [name], [name] 12-03-1925, MRN [id number]  PCP:  Wenda Low, MD    No chief complaint on file.    Wt Readings from Last 3 Encounters:  11/28/17 190 lb 12.8 oz (86.5 kg)  02/13/17 190 lb (86.2 kg)  12/26/16 197 lb 12.8 oz (89.7 kg)       History of Present Illness: [name] is a 86 y.o. male   with history of CAD status post CABG 1 in aVR in 2006. He has chronic atrial fibrillation CHADSVASC=5 on Coumadin and bradycardia treated with pacemaker in 2014, hypertension, DM type II.  Patient was hospitalized 08/2016 with sepsis question secondary to cellulitis.Also had acute on chronic CK D stage III creatinine on admission 2.01 was 1.48 and had slight pulmonary edema on chest x-ray. ACE inhibitor was held in the hospital   12/12/16 with significant CHF with weeping of his upper and lower extremities. 2-D echo 12/07/16 LVEF 60-65% with high ventricular filling pressures and moderate mitral stenosis and mild MR and normally functioning aortic valve. BNP was 1801. Creatinine 1.38. I stopped his Lasix and start torsemide 40 mg twice a day for 3 days and 40 mg daily. Weight was 220 pounds that date. I saw back 12/19/16 he was feeling a little better. He had lost 10 pounds in the first 3 days but then the swelling started to come back. Increasedhis Demadex to 40 mg twice a day. BNP that day was 1485 and creatinine was up to 1.62.   Swelling improved with diuretics, but leg swelling has persisted in the past. Cr stable in November 2018.    He has declined statins in the past.  He follows up with EP regarding his pacemaker.      Past Medical History:  Diagnosis Date  . Aortic stenosis   . BPH (benign prostatic hypertrophy)   . Coronary artery disease   . Dermatophytosis of nail   . Diabetes mellitus without complication ([place])    type 2  . Diverticulosis   . DVT (deep venous thrombosis) ([place])   .  Gastroesophageal reflux disease   . GERD (gastroesophageal reflux disease)   . Gout   . 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 Kidney stones   . PAH (pulmonary artery hypertension) ([place])    PAS 63 on ECHO 09/2012  . Permanent atrial fibrillation ([place])   . PVD (peripheral vascular disease) ([place])   . Shortness of breath   . 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 AORTIC VALVE REPLACEMENT  2006  . BALLOON DILATION N/A 12/16/2013   Procedure: BALLOON DILATION;  Surgeon: Garlan Fair, MD;  Location: [facility] ENDOSCOPY;  Service: Endoscopy;  Laterality: N/A;  . CORONARY ARTERY BYPASS GRAFT    . ESOPHAGOGASTRODUODENOSCOPY N/A 12/16/2013   Procedure: ESOPHAGOGASTRODUODENOSCOPY (EGD);  Surgeon: Garlan Fair, MD;  Location: [facility];  Service: Endoscopy;  Laterality: N/A;  . HERNIA REPAIR    . PACEMAKER INSERTION  10-04-2012   Medtronic pacemaker implanted by Dr Rayann Heman for symptomatic bradycardia  . PARAESOPHAGEAL HERNIA REPAIR    . PERMANENT PACEMAKER INSERTION N/A 10/04/2012   Procedure: PERMANENT PACEMAKER INSERTION;  Surgeon: Thompson Grayer, MD;  Location: [facility] CATH LAB;  Service: Cardiovascular;  Laterality: N/A;  . TEE WITHOUT CARDIOVERSION N/A 12/25/2014   Procedure: TRANSESOPHAGEAL ECHOCARDIOGRAM (TEE)   (  inpatient at [facility]);  Surgeon: Skeet Latch, MD;  Location: [facility] ENDOSCOPY;  Service: Cardiovascular;  Laterality: N/A;     Current Outpatient Medications  Medication Sig Dispense Refill  . acetaminophen (TYLENOL) 325 MG tablet Take 325-650 mg by mouth every 6 (six) hours as needed for mild pain or headache.    . Coenzyme Q10 (CO Q 10 PO) Take 1 capsule by mouth daily.    . famotidine (PEPCID) 20 MG tablet     . glimepiride (AMARYL) 1 MG tablet Take 1 mg by mouth 2 (two) times daily.     . nitroGLYCERIN (NITROSTAT) 0.4 MG SL tablet Place 1 tablet (0.4 mg total) under the tongue  every 5 (five) minutes x 3 doses as needed for chest pain. 25 tablet 6  . tamsulosin (FLOMAX) 0.4 MG CAPS capsule Take 1 capsule (0.4 mg total) by mouth daily. 30 capsule 0  . torsemide (DEMADEX) 20 MG tablet Take 2 tablets (40 mg total) by mouth 2 (two) times daily. Pt must keep appt with provider April, 2021 for further refills 120 tablet 0  . warfarin (COUMADIN) 2.5 MG tablet TAKE ONE TABLET DAILY OR AS DIRECTED BY COUMADIN CLINIC 90 tablet 1  . warfarin (COUMADIN) 5 MG tablet      No current facility-administered medications for this visit.    Allergies:   Demerol [meperidine]    Social History:  The patient  reports that he has never smoked. He has never used smokeless tobacco. He reports that he does not drink alcohol or use drugs.   Family History:  The patient's ***family history includes Cancer - Other in his father and mother.    ROS:  Please see the history of present illness.   Otherwise, review of systems are positive for ***.   All other systems are reviewed and negative.    PHYSICAL EXAM: VS:  There were no vitals taken for this visit. , BMI There is no height or weight on file to calculate BMI. GEN: Well nourished, well developed, in no acute distress HEENT: normal Neck: no JVD, carotid bruits, or masses Cardiac: ***RRR; no murmurs, rubs, or gallops,no edema  Respiratory:  clear to auscultation bilaterally, normal work of breathing GI: soft, nontender, nondistended, + BS MS: no deformity or atrophy Skin: warm and dry, no rash Neuro:  Strength and sensation are intact Psych: euthymic mood, full affect   EKG:   The ekg ordered today demonstrates ***   Recent Labs: No results found for requested labs within last 8760 hours.   Lipid Panel No results found for: CHOL, TRIG, HDL, CHOLHDL, VLDL, LDLCALC, LDLDIRECT   Other studies Reviewed: Additional studies/ records that were reviewed today with results demonstrating: ***.   ASSESSMENT AND PLAN:  1. CAD:   2. S/p AVR: 3. Chronic diastolic heart failure: 4. AFib: 5. Pacemaker: Followed by EP.   Current medicines are reviewed at length with the patient today.  The patient concerns regarding his medicines were addressed.  The following changes have been made:  No change***  Labs/ tests ordered today include: *** No orders of the defined types were placed in this encounter.   Recommend 150 minutes/week of aerobic exercise Low fat, low carb, high fiber diet recommended  Disposition:   FU in ***   Signed, Larae Grooms, MD  06/23/2019 9:16 PM    [facility] [place] [place], [place], [place]  [address] Phone: [phone]; Fax: [phone]

## 2019-06-24 ENCOUNTER — Ambulatory Visit: Payer: Medicare Other | Attending: Internal Medicine

## 2019-06-24 ENCOUNTER — Ambulatory Visit: Payer: Medicare Other | Admitting: Interventional Cardiology

## 2019-06-24 DIAGNOSIS — Z20822 Contact with and (suspected) exposure to covid-19: Secondary | ICD-10-CM

## 2019-06-25 LAB — SARS-COV-2, NAA 2 DAY TAT

## 2019-06-25 LAB — NOVEL CORONAVIRUS, NAA: SARS-CoV-2, NAA: DETECTED — AB

## 2019-06-26 ENCOUNTER — Telehealth: Payer: Self-pay

## 2019-06-26 ENCOUNTER — Telehealth: Payer: Self-pay | Admitting: Physician Assistant

## 2019-06-26 NOTE — Telephone Encounter (Signed)
Called to discuss with patient about Covid symptoms and the use of bamlanivimab/etesevimab or casirivimab/imdevimab, a monoclonal antibody infusion for those with mild to moderate Covid symptoms and at a high risk of hospitalization.  Pt is qualified for this infusion at the [facility] infusion center due to Age > 65   Not able to leave a message and no answer.   [name] Form PA-C  [facility]

## 2019-06-26 NOTE — Telephone Encounter (Signed)
See lab result note.

## 2019-06-27 ENCOUNTER — Other Ambulatory Visit: Payer: Self-pay | Admitting: Physician Assistant

## 2019-06-27 ENCOUNTER — Ambulatory Visit (HOSPITAL_COMMUNITY)
Admission: RE | Admit: 2019-06-27 | Discharge: 2019-06-27 | Disposition: A | Discharge status: Home / Self Care | Payer: Medicare Other | Source: Ambulatory Visit | Attending: Pulmonary Disease | Admitting: Pulmonary Disease

## 2019-06-27 DIAGNOSIS — U071 COVID-19: Secondary | ICD-10-CM

## 2019-06-27 DIAGNOSIS — E1159 Type 2 diabetes mellitus with other circulatory complications: Secondary | ICD-10-CM | POA: Insufficient documentation

## 2019-06-27 DIAGNOSIS — E1122 Type 2 diabetes mellitus with diabetic chronic kidney disease: Secondary | ICD-10-CM | POA: Insufficient documentation

## 2019-06-27 DIAGNOSIS — Z23 Encounter for immunization: Secondary | ICD-10-CM | POA: Insufficient documentation

## 2019-06-27 DIAGNOSIS — Z950 Presence of cardiac pacemaker: Secondary | ICD-10-CM | POA: Insufficient documentation

## 2019-06-27 DIAGNOSIS — I4821 Permanent atrial fibrillation: Secondary | ICD-10-CM | POA: Diagnosis not present

## 2019-06-27 DIAGNOSIS — N183 Chronic kidney disease, stage 3 unspecified: Secondary | ICD-10-CM | POA: Diagnosis not present

## 2019-06-27 MED ORDER — DIPHENHYDRAMINE HCL 50 MG/ML IJ SOLN: 50.0000 mg | Freq: Once | INTRAMUSCULAR | Status: DC | PRN

## 2019-06-27 MED ORDER — SODIUM CHLORIDE 0.9 % IV SOLN: INTRAVENOUS | Status: DC | PRN

## 2019-06-27 MED ORDER — FAMOTIDINE IN NACL 20-0.9 MG/50ML-% IV SOLN: 20.0000 mg | Freq: Once | INTRAVENOUS | Status: DC | PRN

## 2019-06-27 MED ORDER — ALBUTEROL SULFATE HFA 108 (90 BASE) MCG/ACT IN AERS: 2.0000 | INHALATION_SPRAY | Freq: Once | RESPIRATORY_TRACT | Status: DC | PRN

## 2019-06-27 MED ORDER — SODIUM CHLORIDE 0.9 % IV SOLN
Freq: Once | INTRAVENOUS | Status: AC
  Filled 2019-06-27: qty 700

## 2019-06-27 MED ORDER — METHYLPREDNISOLONE SODIUM SUCC 125 MG IJ SOLR: 125.0000 mg | Freq: Once | INTRAMUSCULAR | Status: DC | PRN

## 2019-06-27 MED ORDER — EPINEPHRINE 0.3 MG/0.3ML IJ SOAJ: 0.3000 mg | Freq: Once | INTRAMUSCULAR | Status: DC | PRN

## 2019-06-27 NOTE — Discharge Instructions (Signed)

## 2019-06-27 NOTE — Progress Notes (Signed)
  I connected by phone with [name] on 06/27/2019 at 9:42 AM to discuss the potential use of an new treatment for mild to moderate COVID-19 viral infection in non-hospitalized patients.  This patient is a 86 y.o. male that meets the FDA criteria for Emergency Use Authorization of bamlanivimab/etesevimab or casirivimab/imdevimab.  Has a (+) direct SARS-CoV-2 viral test result  Has mild or moderate COVID-19   Is ? 86 years of age and weighs ? 40 kg  Is NOT hospitalized due to COVID-19  Is NOT requiring oxygen therapy or requiring an increase in baseline oxygen flow rate due to COVID-19  Is within 10 days of symptom onset  Has at least one of the high risk factor(s) for progression to severe COVID-19 and/or hospitalization as defined in EUA.  Specific high risk criteria : >/= 84 yo, HTN, CVD, DMT2   I have spoken and communicated the following to the patient or parent/caregiver:  1. FDA has authorized the emergency use of bamlanivimab/etesevimab and casirivimab\imdevimab for the treatment of mild to moderate COVID-19 in adults and pediatric patients with positive results of direct SARS-CoV-2 viral testing who are 67 years of age and older weighing at least 40 kg, and who are at high risk for progressing to severe COVID-19 and/or hospitalization.  2. The significant known and potential risks and benefits of bamlanivimab/etesevimab and casirivimab\imdevimab, and the extent to which such potential risks and benefits are unknown.  3. Information on available alternative treatments and the risks and benefits of those alternatives, including clinical trials.  4. Patients treated with bamlanivimab/etesevimab and casirivimab\imdevimab should continue to self-isolate and use infection control measures (e.g., wear mask, isolate, social distance, avoid sharing personal items, clean and disinfect "high touch" surfaces, and frequent handwashing) according to CDC guidelines.   5. The patient or  parent/caregiver has the option to accept or refuse bamlanivimab/etesevimab or casirivimab\imdevimab .   After reviewing this information with the patient, The patient agreed to proceed with receiving the bamlanivimab/etesevimab infusion and will be provided a copy of the Fact sheet prior to receiving the infusion..  Sx onset 4/26. Set up for infusion today 06/27/19. Coming in with wife.  [name] Form 06/27/2019 9:42 AM

## 2019-06-27 NOTE — Progress Notes (Signed)
  Diagnosis: COVID-19  Physician: Dr. Joya Gaskins  Procedure: Cov[facility] Infusion Clinic Med: bamlanivimab\etesevimab infusion - Provided patient with bamlanimivab\etesevimab fact sheet for patients, parents and caregivers prior to infusion.  Complications: No immediate complications noted.  Discharge: Discharged home   Janine Ores 06/27/2019

## 2019-07-14 NOTE — Progress Notes (Signed)
Cardiology Office Note   Date:  07/15/2019   ID:  [name], DOB [birth year], MRN [id number]  PCP:  Wenda Low, MD    No chief complaint on file.  AFib  Wt Readings from Last 3 Encounters:  07/15/19 180 lb (81.6 kg)  11/28/17 190 lb 12.8 oz (86.5 kg)  02/13/17 190 lb (86.2 kg)       History of Present Illness: [name] is a 86 y.o. male   with history of CAD status post CABG 1 in aVR in 2006. He has chronic atrial fibrillation CHADSVASC=5 on Coumadin and bradycardia treated with pacemaker in 2014, hypertension, DM type II.  Patient was hospitalized 08/2016 with sepsis question secondary to cellulitis.Also had acute on chronic CK D stage III creatinine on admission 2.01 was 1.48 and had slight pulmonary edema on chest x-ray. ACE inhibitor was held in the hospital   12/12/16 with significant CHF with weeping of his upper and lower extremities. 2-D echo 12/07/16 LVEF 60-65% with high ventricular filling pressures and moderate mitral stenosis and mild MR and normally functioning aortic valve. BNP was 1801. Creatinine 1.38. I stopped his Lasix and start torsemide 40 mg twice a day for 3 days and 40 mg daily. Weight was 220 pounds that date. I saw back 12/19/16 he was feeling a little better. He had lost 10 pounds in the first 3 days but then the swelling started to come back. Increasedhis Demadex to 40 mg twice a day. BNP that day was 1485 and creatinine was up to 1.62.   Swelling improved with diuretics. Cr stable in November 2018.   He again declined statin in 2019. He has tolerated Coumadin.   He had COVID in 4/21.  He was treated with antibody treatment: " The patient agreed to proceed with receiving the bamlanivimab/etesevimab infusion"  Since then, he has improved a lot as well.  He had some sort of tick fever.   No bleeding problems.  Uses tylenol for leg pain.    Past Medical History:  Diagnosis Date  . Aortic stenosis   . BPH (benign prostatic  hypertrophy)   . Coronary artery disease   . Dermatophytosis of nail   . Diabetes mellitus without complication ([place])    type 2  . Diverticulosis   . DVT (deep venous thrombosis) ([place])   . Gastroesophageal reflux disease   . GERD (gastroesophageal reflux disease)   . Gout   . 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 Kidney stones   . PAH (pulmonary artery hypertension) ([place])    PAS 63 on ECHO 09/2012  . Permanent atrial fibrillation ([place])   . PVD (peripheral vascular disease) ([place])   . Shortness of breath   . 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 AORTIC VALVE REPLACEMENT  2006  . BALLOON DILATION N/A 12/16/2013   Procedure: BALLOON DILATION;  Surgeon: Garlan Fair, MD;  Location: [facility] ENDOSCOPY;  Service: Endoscopy;  Laterality: N/A;  . CORONARY ARTERY BYPASS GRAFT    . ESOPHAGOGASTRODUODENOSCOPY N/A 12/16/2013   Procedure: ESOPHAGOGASTRODUODENOSCOPY (EGD);  Surgeon: Garlan Fair, MD;  Location: [facility];  Service: Endoscopy;  Laterality: N/A;  . HERNIA REPAIR    . PACEMAKER INSERTION  10-04-2012   Medtronic pacemaker implanted by Dr Rayann Heman for symptomatic bradycardia  . PARAESOPHAGEAL HERNIA REPAIR    . PERMANENT PACEMAKER INSERTION N/A 10/04/2012  Procedure: PERMANENT PACEMAKER INSERTION;  Surgeon: Thompson Grayer, MD;  Location: [facility] CATH LAB;  Service: Cardiovascular;  Laterality: N/A;  . TEE WITHOUT CARDIOVERSION N/A 12/25/2014   Procedure: TRANSESOPHAGEAL ECHOCARDIOGRAM (TEE)   (inpatient at [facility]);  Surgeon: Skeet Latch, MD;  Location: [facility] ENDOSCOPY;  Service: Cardiovascular;  Laterality: N/A;     Current Outpatient Medications  Medication Sig Dispense Refill  . acetaminophen (TYLENOL) 325 MG tablet Take 325-650 mg by mouth every 6 (six) hours as needed for mild pain or headache.    . Coenzyme Q10 (CO Q 10 PO) Take 1 capsule by mouth daily.    . famotidine  (PEPCID) 20 MG tablet     . glimepiride (AMARYL) 1 MG tablet Take 1 mg by mouth 2 (two) times daily.     . nitroGLYCERIN (NITROSTAT) 0.4 MG SL tablet Place 1 tablet (0.4 mg total) under the tongue every 5 (five) minutes x 3 doses as needed for chest pain. 25 tablet 6  . tamsulosin (FLOMAX) 0.4 MG CAPS capsule Take 1 capsule (0.4 mg total) by mouth daily. 30 capsule 0  . torsemide (DEMADEX) 20 MG tablet Take 2 tablets (40 mg total) by mouth 2 (two) times daily. Pt must keep appt with provider April, 2021 for further refills 120 tablet 0  . warfarin (COUMADIN) 2.5 MG tablet TAKE ONE TABLET DAILY OR AS DIRECTED BY COUMADIN CLINIC 90 tablet 1  . warfarin (COUMADIN) 5 MG tablet      No current facility-administered medications for this visit.    Allergies:   Demerol [meperidine]    Social History:  The patient  reports that he has never smoked. He has never used smokeless tobacco. He reports that he does not drink alcohol or use drugs.   Family History:  The patient's family history includes Cancer - Other in his father and mother.    ROS:  Please see the history of present illness.   Otherwise, review of systems are positive for recent COVID infection. Knee pain.   All other systems are reviewed and negative.     PHYSICAL EXAM: VS:  BP 130/88   Pulse 84   Ht 5\' 9"  (1.753 m)   Wt 180 lb (81.6 kg)   SpO2 91%   BMI 26.58 kg/m  , BMI Body mass index is 26.58 kg/m. GEN: Well nourished, well developed, in no acute distress  HEENT: normal  Neck: no JVD, carotid bruits, or masses Cardiac: RRR; 2/6 early murmur, no rubs, or gallops,; left legedema  Respiratory:  clear to auscultation bilaterally, normal work of breathing GI: soft, nontender, nondistended, + BS MS: no deformity or atrophy  Skin: warm and dry, no rash Neuro:  Slow gait; uses walker Psych: euthymic mood, full affect   EKG:   The ekg ordered today demonstrates V- pacing   Recent Labs: No results found for requested  labs within last 8760 hours.   Lipid Panel No results found for: CHOL, TRIG, HDL, CHOLHDL, VLDL, LDLCALC, LDLDIRECT   Other studies Reviewed: Additional studies/ records that were reviewed today with results demonstrating: PMD labs reviewed.   ASSESSMENT AND PLAN:  1. CAD: No angina.  COntinue aggressive secondary prevention.  2. AFib: Coumadin for stroke prevention.  3. Chronic systolic/diastolic heart failure: Appears euvolemic. 4. S/p AVR: SBE prophylaxis. He has not been going to the dentist regularly.  I encouraged him to go to the dentist.  5. Anticoagulated: Coumadin for stroke prevention.  He needs to avoid falls.  6. DM:  A1C 8.5 in 3/21.  Avoid soft drinks.   7. Pacer checked with EP.   Current medicines are reviewed at length with the patient today.  The patient concerns regarding his medicines were addressed.  The following changes have been made:  No change  Labs/ tests ordered today include:  No orders of the defined types were placed in this encounter.   Recommend 150 minutes/week of aerobic exercise Low fat, low carb, high fiber diet recommended  Disposition:   FU in 1 year   Signed, Larae Grooms, MD  07/15/2019 10:27 AM    [facility] [place], [place], [place]  [address] Phone: [phone]; Fax: [phone]

## 2019-07-15 ENCOUNTER — Encounter: Payer: Self-pay | Admitting: Interventional Cardiology

## 2019-07-15 ENCOUNTER — Other Ambulatory Visit: Payer: Self-pay

## 2019-07-15 ENCOUNTER — Ambulatory Visit (INDEPENDENT_AMBULATORY_CARE_PROVIDER_SITE_OTHER): Payer: Medicare Other | Admitting: Interventional Cardiology

## 2019-07-15 VITALS — BP 130/88 | HR 84 | Ht 69.0 in | Wt 180.0 lb

## 2019-07-15 DIAGNOSIS — I4821 Permanent atrial fibrillation: Secondary | ICD-10-CM

## 2019-07-15 DIAGNOSIS — Z950 Presence of cardiac pacemaker: Secondary | ICD-10-CM

## 2019-07-15 DIAGNOSIS — I5042 Chronic combined systolic (congestive) and diastolic (congestive) heart failure: Secondary | ICD-10-CM | POA: Diagnosis not present

## 2019-07-15 DIAGNOSIS — Z952 Presence of prosthetic heart valve: Secondary | ICD-10-CM

## 2019-07-15 DIAGNOSIS — I2510 Atherosclerotic heart disease of native coronary artery without angina pectoris: Secondary | ICD-10-CM | POA: Diagnosis not present

## 2019-07-15 DIAGNOSIS — N183 Chronic kidney disease, stage 3 unspecified: Secondary | ICD-10-CM | POA: Diagnosis not present

## 2019-07-15 NOTE — Patient Instructions (Signed)
Medication Instructions:  Your physician recommends that you continue on your current medications as directed. Please refer to the Current Medication list given to you today.  *If you need a refill on your cardiac medications before your next appointment, please call your pharmacy*   Lab Work: None ordered  If you have labs (blood work) drawn today and your tests are completely normal, you will receive your results only by: . MyChart Message (if you have MyChart) OR . A paper copy in the mail If you have any lab test that is abnormal or we need to change your treatment, we will call you to review the results.   Testing/Procedures: None ordered   Follow-Up: At [facility], you and your health needs are our priority.  As part of our continuing mission to provide you with exceptional heart care, we have created designated Provider Care Teams.  These Care Teams include your primary Cardiologist (physician) and Advanced Practice Providers (APPs -  Physician Assistants and Nurse Practitioners) who all work together to provide you with the care you need, when you need it.  We recommend signing up for the patient portal called "MyChart".  Sign up information is provided on this After Visit Summary.  MyChart is used to connect with patients for Virtual Visits (Telemedicine).  Patients are able to view lab/test results, encounter notes, upcoming appointments, etc.  Non-urgent messages can be sent to your provider as well.   To learn more about what you can do with MyChart, go to [email or web address].    Your next appointment:   12 month(s)  The format for your next appointment:   In Person  Provider:   You may see Jay Varanasi, MD or one of the following Advanced Practice Providers on your designated Care Team:    Dayna Dunn, PA-C  Michele Lenze, PA-C    Other Instructions   

## 2019-07-16 ENCOUNTER — Telehealth: Payer: Self-pay

## 2019-07-16 NOTE — Telephone Encounter (Signed)
Unable to speak  with patient to remind of missed remote transmission 

## 2019-07-17 ENCOUNTER — Other Ambulatory Visit: Payer: Self-pay | Admitting: Interventional Cardiology

## 2019-08-01 ENCOUNTER — Ambulatory Visit (INDEPENDENT_AMBULATORY_CARE_PROVIDER_SITE_OTHER): Payer: Medicare Other | Admitting: Pharmacist

## 2019-08-01 ENCOUNTER — Other Ambulatory Visit: Payer: Self-pay

## 2019-08-01 DIAGNOSIS — I4891 Unspecified atrial fibrillation: Secondary | ICD-10-CM

## 2019-08-01 DIAGNOSIS — Z5181 Encounter for therapeutic drug level monitoring: Secondary | ICD-10-CM

## 2019-08-01 LAB — POCT INR: INR: 3.4 — AB (ref 2.0–3.0)

## 2019-08-01 NOTE — Patient Instructions (Signed)
Description   Skip your Coumadin today, then continue on same dosage 1 tablet (2.5mg ) every day except 1/2 tablet (1.25mg ) on Saturdays.  Recheck in 4 weeks. Call us if any new medications are added or changed. [phone]

## 2019-08-03 IMAGING — CT CT HEAD W/O CM
4 series · 17 of 47 positions shown, 19 images · non-contrast
Comparison: 12/23/2014

CLINICAL DATA: New diagnosis of Lyme.  Fifty.  Headache.  Syncope.

EXAM:
CT HEAD WITHOUT CONTRAST
TECHNIQUE: Contiguous axial images were obtained from the base of the skull
through the vertex without intravenous contrast.

[Series 5: head bone · axial · 0.49mm/px · z∈[-49,-7]mm · 3 of 83 slices shown]
[im 9/83  bone]
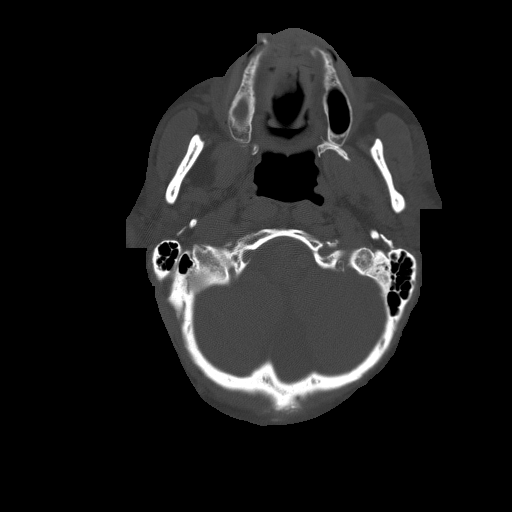
[im 18/83  bone]
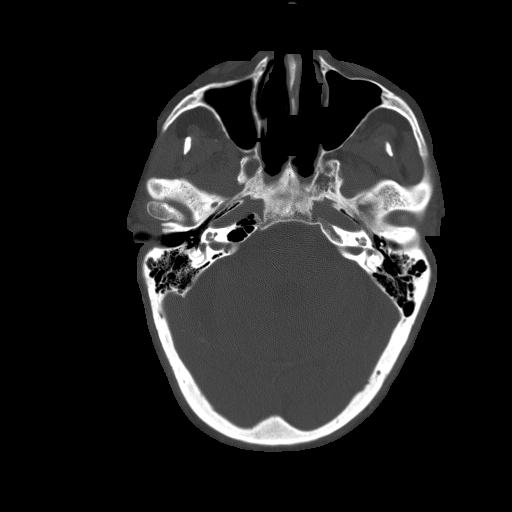
[im 26/83  bone]
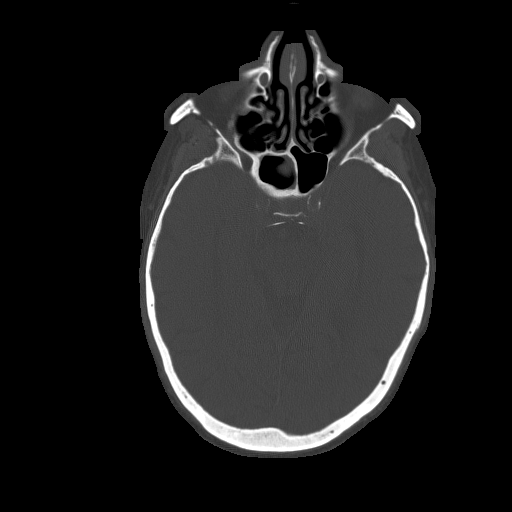

[Series 34: 3d filtered head w/o · axial · non-contrast · 0.49mm/px · z∈[-49,+111]mm · 8 of 42 slices shown, 10 images]
[im 5/42  brain]
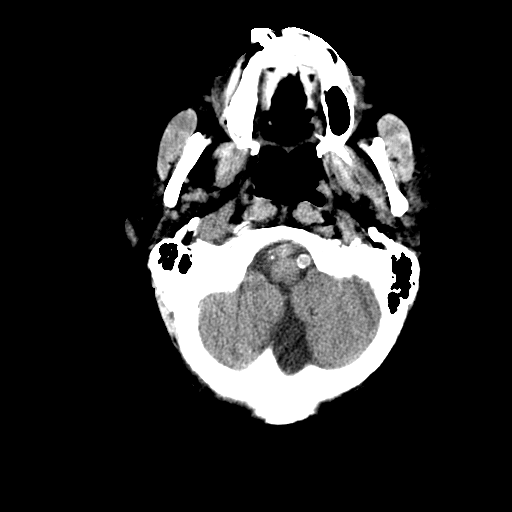
[im 5/42  bone]
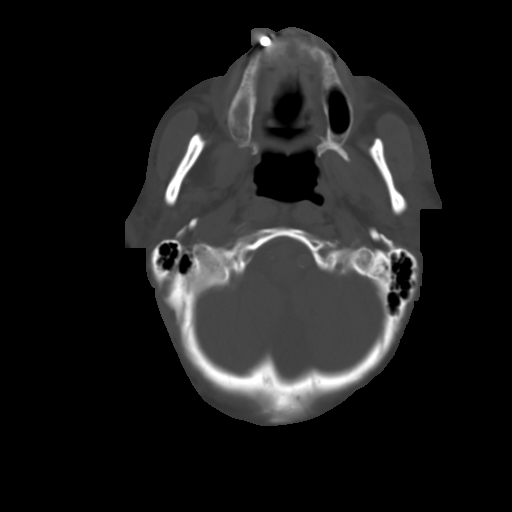
[im 10/42  brain]
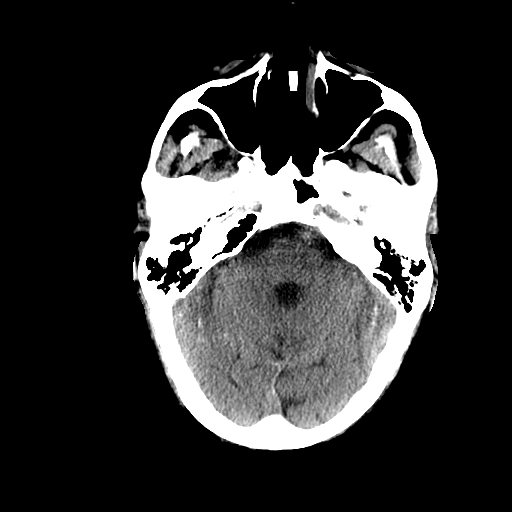
[im 14/42  brain]
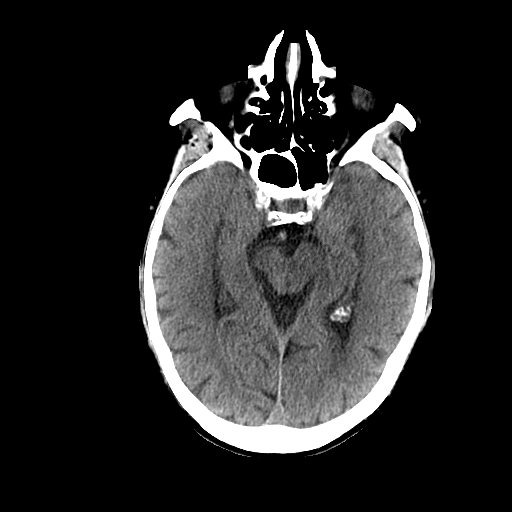
[im 19/42  brain]
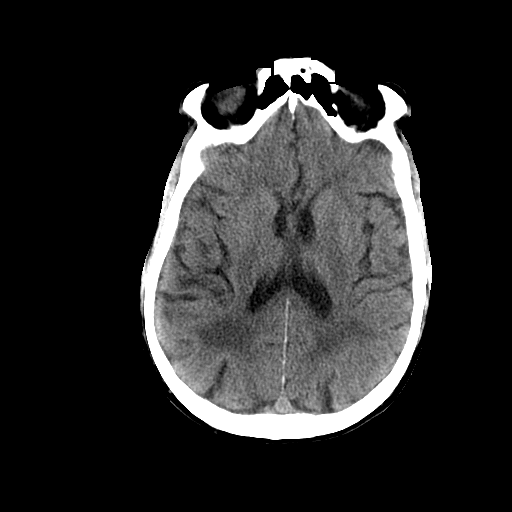
[im 23/42  brain]
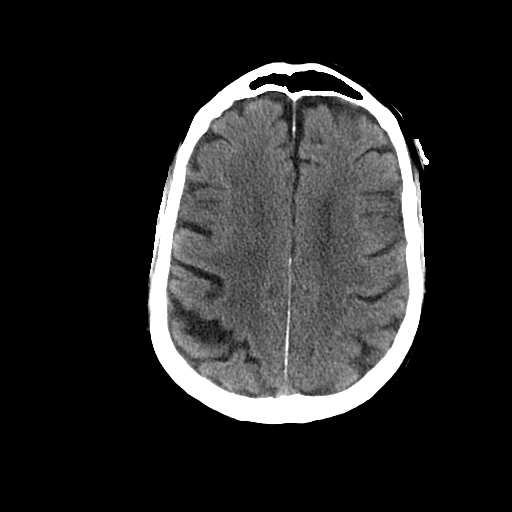
[im 23/42  bone]
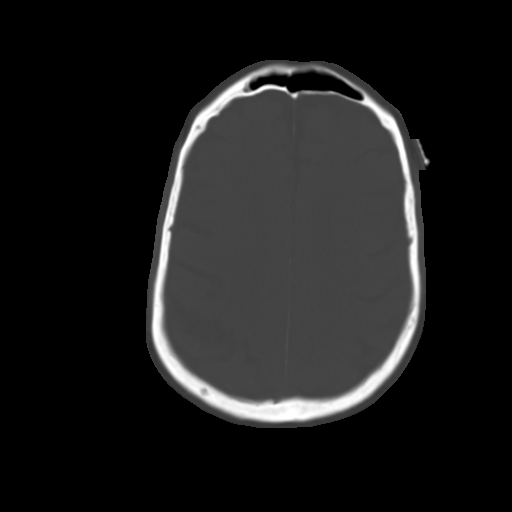
[im 28/42  brain]
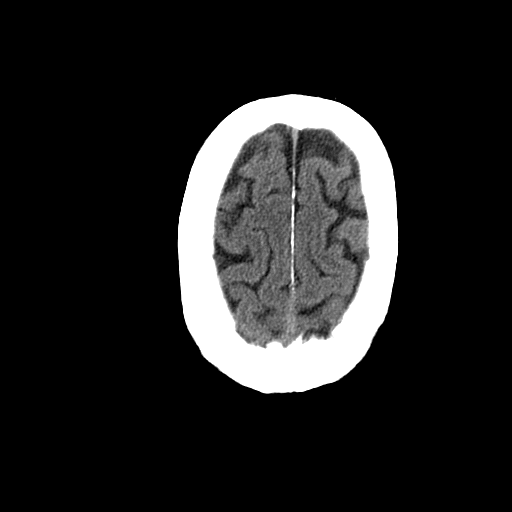
[im 32/42  brain]
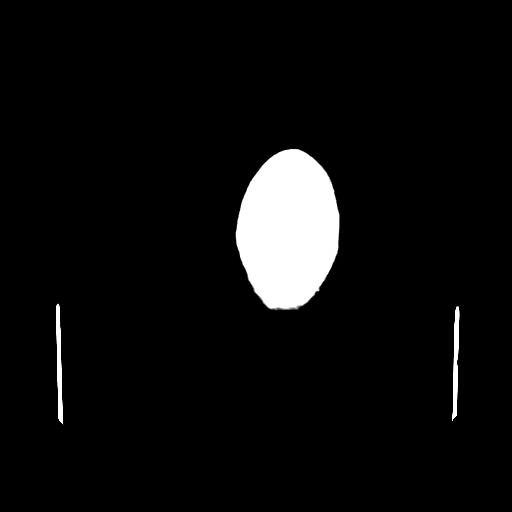
[im 37/42  brain]
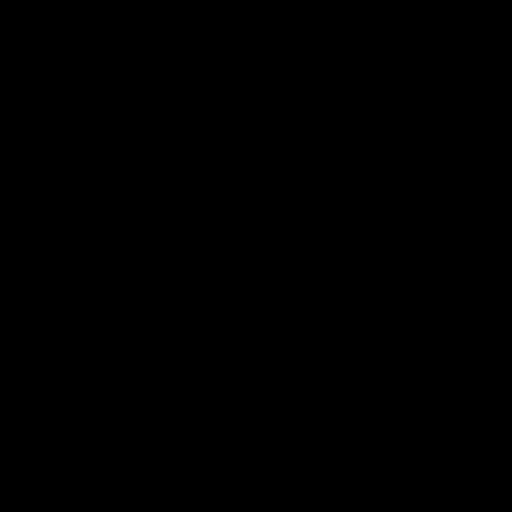

[Series 601: coronal brain · coronal · 0.49mm/px · 3 of 77 slices shown]
[im 26/77  brain]
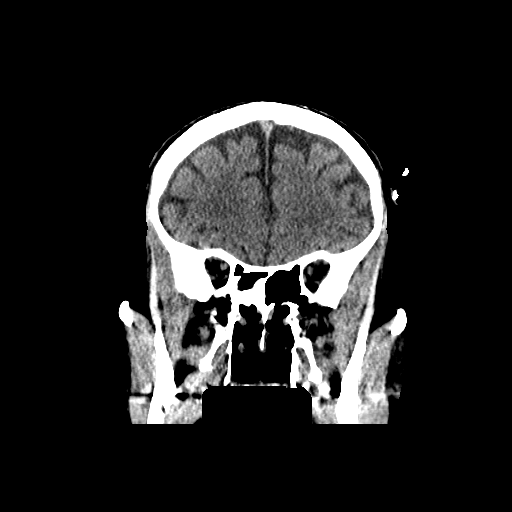
[im 34/77  brain]
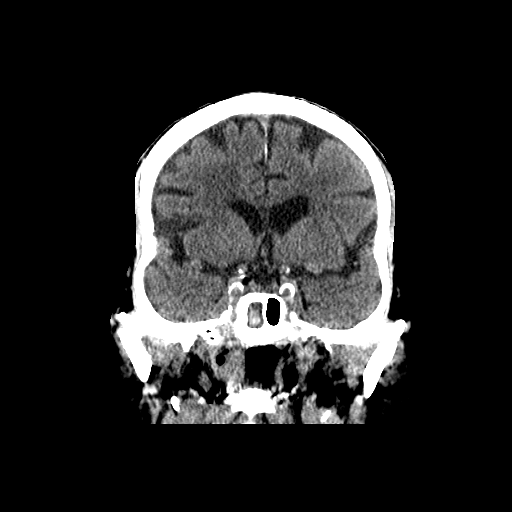
[im 43/77  brain]
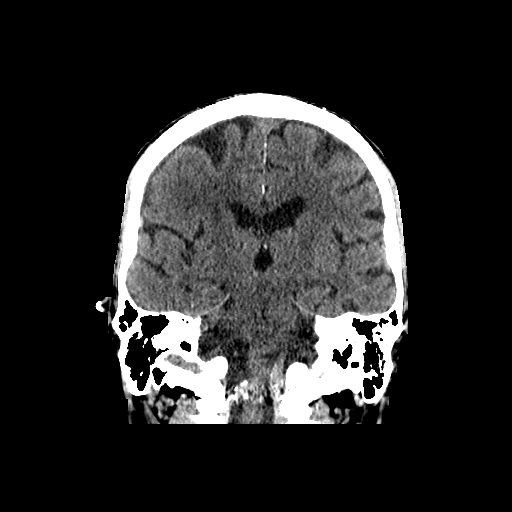

[Series 602: sagittal brain · sagittal · 0.49mm/px · 3 of 62 slices shown]
[im 21/62  brain]
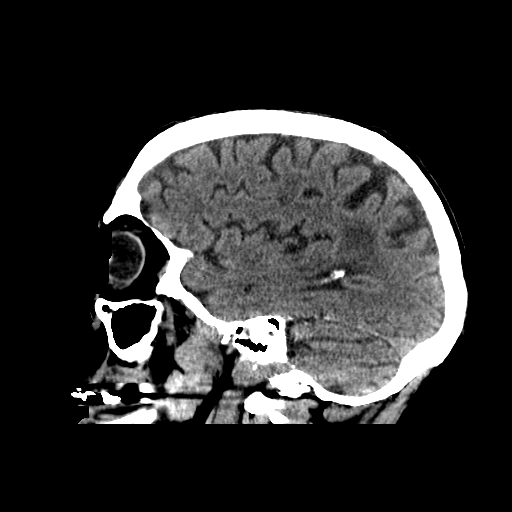
[im 31/62  brain]
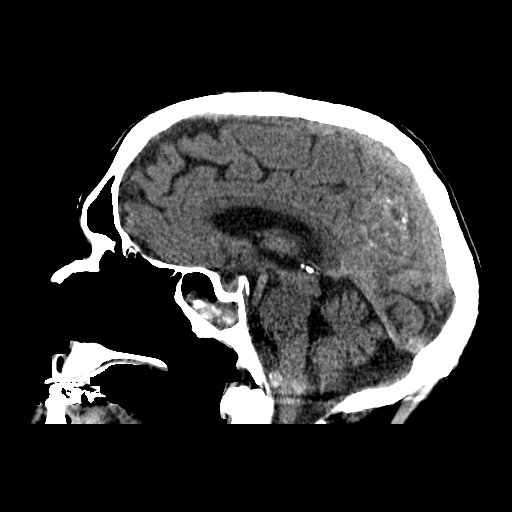
[im 41/62  brain]
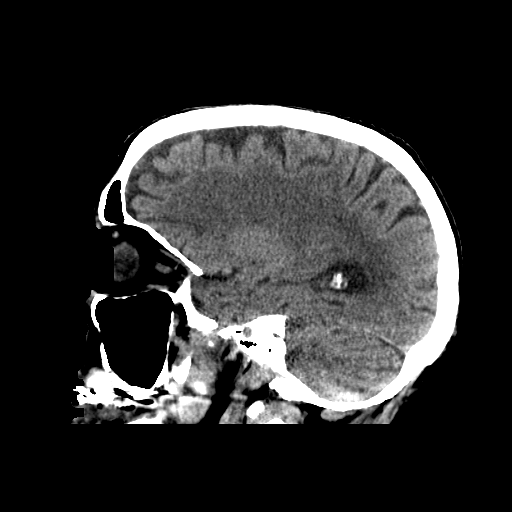

[17 of 47 positions shown; findings below may reference images not displayed]

FINDINGS: Brain: No evidence of acute infarction, hemorrhage, hydrocephalus,
extra-axial collection or mass lesion/mass effect.There are
multifocal patchy areas of low attenuation within the subcortical
and periventricular white matter compatible with chronic small
vessel ischemic disease. Prominence of the sulci and ventricles
compatible with brain atrophy.

Vascular: No hyperdense vessel or unexpected calcification.

Skull: Normal. Negative for fracture or focal lesion.

Sinuses/Orbits: Partial opacification of the sphenoid sinus with
evidence of chronic mucoperiosteal thickening noted. There is also
mild mucosal thickening involving the right maxillary sinus.

Other: None
IMPRESSION: 1. No acute intracranial abnormalities.
2. Chronic small vessel ischemic disease and brain atrophy.

## 2019-08-15 ENCOUNTER — Ambulatory Visit: Payer: Medicare Other | Admitting: Podiatry

## 2019-08-15 ENCOUNTER — Other Ambulatory Visit: Payer: Self-pay

## 2019-08-15 ENCOUNTER — Encounter: Payer: Self-pay | Admitting: Podiatry

## 2019-08-15 ENCOUNTER — Ambulatory Visit (INDEPENDENT_AMBULATORY_CARE_PROVIDER_SITE_OTHER): Payer: Medicare Other | Admitting: Podiatry

## 2019-08-15 DIAGNOSIS — D689 Coagulation defect, unspecified: Secondary | ICD-10-CM

## 2019-08-15 DIAGNOSIS — M79675 Pain in left toe(s): Secondary | ICD-10-CM | POA: Diagnosis not present

## 2019-08-15 DIAGNOSIS — M79674 Pain in right toe(s): Secondary | ICD-10-CM | POA: Diagnosis not present

## 2019-08-15 DIAGNOSIS — B351 Tinea unguium: Secondary | ICD-10-CM

## 2019-08-15 DIAGNOSIS — E1159 Type 2 diabetes mellitus with other circulatory complications: Secondary | ICD-10-CM

## 2019-08-15 NOTE — Progress Notes (Signed)
This patient returns to my office for at risk foot care.  This patient requires this care by a professional since this patient will be at risk due to having diabetes and chronic kidney disease.  Patient is taking coumadin.  This patient is unable to cut nails himself since the patient cannot reach his nails.These nails are painful walking and wearing shoes.  This patient presents for at risk foot care today.  General Appearance  Alert, conversant and in no acute stress.  Vascular  Dorsalis pedis and posterior tibial  pulses are not  palpable  bilaterally.  Capillary return is within normal limits  bilaterally. Temperature is within normal limits  bilaterally.  Neurologic  Senn-Weinstein monofilament wire test within normal limits  bilaterally. Muscle power within normal limits bilaterally.  Nails Thick disfigured discolored nails with subungual debris  from hallux to fifth toes bilaterally. No evidence of bacterial infection or drainage bilaterally.  Orthopedic  No limitations of motion  feet .  No crepitus or effusions noted.  No bony pathology or digital deformities noted.  Skin  normotropic skin with no porokeratosis noted bilaterally.  No signs of infections or ulcers noted.   Asymptomatic clavi third toe right foot.  Onychomycosis  Pain in right toes  Pain in left toes  Consent was obtained for treatment procedures.   Mechanical debridement of nails 1-5  bilaterally performed with a nail nipper.  Filed with dremel without incident.    Return office visit   3 months                  Told patient to return for periodic foot care and evaluation due to potential at risk complications.   Adali Pennings DPM  

## 2019-08-29 ENCOUNTER — Ambulatory Visit (INDEPENDENT_AMBULATORY_CARE_PROVIDER_SITE_OTHER): Payer: Medicare Other | Admitting: *Deleted

## 2019-08-29 ENCOUNTER — Other Ambulatory Visit: Payer: Self-pay

## 2019-08-29 DIAGNOSIS — Z5181 Encounter for therapeutic drug level monitoring: Secondary | ICD-10-CM

## 2019-08-29 DIAGNOSIS — I4891 Unspecified atrial fibrillation: Secondary | ICD-10-CM

## 2019-08-29 LAB — POCT INR: INR: 2.6 (ref 2.0–3.0)

## 2019-08-29 NOTE — Patient Instructions (Signed)
Description   Continue taking Warfarin 1 tablet (2.5mg) every day except 1/2 tablet (1.25mg) on Saturdays.  Recheck in 4 weeks. Call us if any new medications are added or changed. [phone]      

## 2019-09-19 ENCOUNTER — Other Ambulatory Visit: Payer: Self-pay | Admitting: Interventional Cardiology

## 2019-09-23 ENCOUNTER — Other Ambulatory Visit: Payer: Self-pay | Admitting: Interventional Cardiology

## 2019-09-26 ENCOUNTER — Other Ambulatory Visit: Payer: Self-pay

## 2019-09-26 ENCOUNTER — Ambulatory Visit: Payer: Medicare Other | Attending: Internal Medicine

## 2019-09-26 ENCOUNTER — Ambulatory Visit (INDEPENDENT_AMBULATORY_CARE_PROVIDER_SITE_OTHER): Payer: Medicare Other

## 2019-09-26 DIAGNOSIS — I4891 Unspecified atrial fibrillation: Secondary | ICD-10-CM | POA: Diagnosis not present

## 2019-09-26 DIAGNOSIS — Z23 Encounter for immunization: Secondary | ICD-10-CM

## 2019-09-26 DIAGNOSIS — Z5181 Encounter for therapeutic drug level monitoring: Secondary | ICD-10-CM

## 2019-09-26 LAB — POCT INR: INR: 2.6 (ref 2.0–3.0)

## 2019-09-26 NOTE — Patient Instructions (Signed)
Description   Continue taking Warfarin 1 tablet (2.5mg ) every day except 1/2 tablet (1.25mg ) on Saturdays.  Recheck in 6 weeks. Call us if any new medications are added or changed. [phone]

## 2019-09-26 NOTE — Progress Notes (Signed)
   Covid-19 Vaccination Clinic  Name:  [name]    MRN: [id number] DOB: Jul 18, 1925  09/26/2019  [name] was observed post Covid-19 immunization for 15 minutes without incident. He was provided with Vaccine Information Sheet and instruction to access the V-Safe system.   [name] was instructed to call 911 with any severe reactions post vaccine: [name] Difficulty breathing  . Swelling of face and throat  . A fast heartbeat  . A bad rash all over body  . Dizziness and weakness   Immunizations Administered    Name Date Dose VIS Date Route   Pfizer COVID-19 Vaccine 09/26/2019 11:04 AM 0.3 mL 04/24/2018 Intramuscular   Manufacturer: [place]   Lot: [id number]   [place]: [id number]

## 2019-10-28 ENCOUNTER — Ambulatory Visit: Payer: Medicare Other | Attending: Internal Medicine

## 2019-10-28 ENCOUNTER — Ambulatory Visit: Payer: Medicare Other

## 2019-10-28 DIAGNOSIS — Z23 Encounter for immunization: Secondary | ICD-10-CM

## 2019-10-28 NOTE — Progress Notes (Signed)
   Covid-19 Vaccination Clinic  Name:  [name]    MRN: [id number] DOB: [birth year]  10/28/2019  [name] was observed post Covid-19 immunization for 15 minutes without incident. He was provided with Vaccine Information Sheet and instruction to access the V-Safe system.   [name] was instructed to call 911 with any severe reactions post vaccine: [name] Difficulty breathing  . Swelling of face and throat  . A fast heartbeat  . A bad rash all over body  . Dizziness and weakness   Immunizations Administered    Name Date Dose VIS Date Route   Pfizer COVID-19 Vaccine 10/28/2019 11:19 AM 0.3 mL 04/24/2018 Intramuscular   Manufacturer: [employer]   Lot: [id number]   [place]: [id number]

## 2019-11-06 ENCOUNTER — Ambulatory Visit (INDEPENDENT_AMBULATORY_CARE_PROVIDER_SITE_OTHER): Payer: Medicare Other | Admitting: *Deleted

## 2019-11-06 DIAGNOSIS — I441 Atrioventricular block, second degree: Secondary | ICD-10-CM

## 2019-11-06 LAB — CUP PACEART REMOTE DEVICE CHECK
Battery Impedance: 3664 Ohm
Battery Remaining Longevity: 16 mo
Battery Voltage: 2.71 V
Brady Statistic RV Percent Paced: 99 %
Date Time Interrogation Session: 20210907174042
Implantable Lead Implant Date: 20140807
Implantable Lead Location: 753860
Implantable Lead Model: 5092
Implantable Pulse Generator Implant Date: 20140807
Lead Channel Impedance Value: 0 Ohm
Lead Channel Impedance Value: 550 Ohm
Lead Channel Pacing Threshold Amplitude: 0.75 V
Lead Channel Pacing Threshold Pulse Width: 0.4 ms
Lead Channel Setting Pacing Amplitude: 2.5 V
Lead Channel Setting Pacing Pulse Width: 0.4 ms
Lead Channel Setting Sensing Sensitivity: 2 mV

## 2019-11-07 ENCOUNTER — Other Ambulatory Visit: Payer: Self-pay

## 2019-11-07 ENCOUNTER — Ambulatory Visit (INDEPENDENT_AMBULATORY_CARE_PROVIDER_SITE_OTHER): Payer: Medicare Other | Admitting: *Deleted

## 2019-11-07 DIAGNOSIS — I4891 Unspecified atrial fibrillation: Secondary | ICD-10-CM

## 2019-11-07 DIAGNOSIS — Z5181 Encounter for therapeutic drug level monitoring: Secondary | ICD-10-CM

## 2019-11-07 LAB — POCT INR: INR: 2.8 (ref 2.0–3.0)

## 2019-11-07 NOTE — Progress Notes (Signed)
Remote pacemaker transmission.   

## 2019-11-07 NOTE — Patient Instructions (Signed)
Description   Continue taking Warfarin 1 tablet (2.5mg) every day except 1/2 tablet (1.25mg) on Saturdays.  Recheck in 6 weeks. Call us if any new medications are added or changed. [phone]      

## 2019-11-18 ENCOUNTER — Other Ambulatory Visit: Payer: Self-pay

## 2019-11-18 ENCOUNTER — Ambulatory Visit (INDEPENDENT_AMBULATORY_CARE_PROVIDER_SITE_OTHER): Payer: Medicare Other | Admitting: Podiatry

## 2019-11-18 ENCOUNTER — Encounter: Payer: Self-pay | Admitting: Podiatry

## 2019-11-18 DIAGNOSIS — B351 Tinea unguium: Secondary | ICD-10-CM

## 2019-11-18 DIAGNOSIS — M79675 Pain in left toe(s): Secondary | ICD-10-CM

## 2019-11-18 DIAGNOSIS — E1159 Type 2 diabetes mellitus with other circulatory complications: Secondary | ICD-10-CM

## 2019-11-18 DIAGNOSIS — M79674 Pain in right toe(s): Secondary | ICD-10-CM | POA: Diagnosis not present

## 2019-11-18 DIAGNOSIS — D689 Coagulation defect, unspecified: Secondary | ICD-10-CM

## 2019-11-18 NOTE — Progress Notes (Signed)
This patient returns to my office for at risk foot care.  This patient requires this care by a professional since this patient will be at risk due to having diabetes and chronic kidney disease.  Patient is taking coumadin.  This patient is unable to cut nails himself since the patient cannot reach his nails.These nails are painful walking and wearing shoes.  This patient presents for at risk foot care today.  General Appearance  Alert, conversant and in no acute stress.  Vascular  Dorsalis pedis and posterior tibial  pulses are not  palpable  bilaterally.  Capillary return is within normal limits  bilaterally. Temperature is within normal limits  bilaterally.  Neurologic  Senn-Weinstein monofilament wire test within normal limits  bilaterally. Muscle power within normal limits bilaterally.  Nails Thick disfigured discolored nails with subungual debris  from hallux to fifth toes bilaterally. No evidence of bacterial infection or drainage bilaterally.  Orthopedic  No limitations of motion  feet .  No crepitus or effusions noted.  No bony pathology or digital deformities noted.  Skin  normotropic skin with no porokeratosis noted bilaterally.  No signs of infections or ulcers noted.   Asymptomatic clavi third toe right foot.  Onychomycosis  Pain in right toes  Pain in left toes  Consent was obtained for treatment procedures.   Mechanical debridement of nails 1-5  bilaterally performed with a nail nipper.  Filed with dremel without incident.    Return office visit   3 months                  Told patient to return for periodic foot care and evaluation due to potential at risk complications.   Gardiner Barefoot DPM

## 2019-12-19 ENCOUNTER — Ambulatory Visit (INDEPENDENT_AMBULATORY_CARE_PROVIDER_SITE_OTHER): Payer: Medicare Other | Admitting: *Deleted

## 2019-12-19 ENCOUNTER — Other Ambulatory Visit: Payer: Self-pay

## 2019-12-19 DIAGNOSIS — Z5181 Encounter for therapeutic drug level monitoring: Secondary | ICD-10-CM

## 2019-12-19 DIAGNOSIS — I4891 Unspecified atrial fibrillation: Secondary | ICD-10-CM | POA: Diagnosis not present

## 2019-12-19 LAB — POCT INR: INR: 3.2 — AB (ref 2.0–3.0)

## 2019-12-19 NOTE — Patient Instructions (Signed)
Description   Hold today, then Continue taking Warfarin 1 tablet (2.5mg ) every day except 1/2 tablet (1.25mg ) on Saturdays.  Recheck in 3 weeks. Call us if any new medications are added or changed. [phone]

## 2020-01-09 ENCOUNTER — Ambulatory Visit (INDEPENDENT_AMBULATORY_CARE_PROVIDER_SITE_OTHER): Payer: Medicare Other | Admitting: *Deleted

## 2020-01-09 ENCOUNTER — Other Ambulatory Visit: Payer: Self-pay

## 2020-01-09 DIAGNOSIS — I4891 Unspecified atrial fibrillation: Secondary | ICD-10-CM | POA: Diagnosis not present

## 2020-01-09 DIAGNOSIS — Z5181 Encounter for therapeutic drug level monitoring: Secondary | ICD-10-CM

## 2020-01-09 LAB — POCT INR: INR: 3 (ref 2.0–3.0)

## 2020-01-09 NOTE — Patient Instructions (Signed)
Description   Continue taking Warfarin 1 tablet (2.5mg) every day except 1/2 tablet (1.25mg) on Saturdays.  Recheck in 4 weeks. Call us if any new medications are added or changed. [phone]      

## 2020-01-28 DIAGNOSIS — E1122 Type 2 diabetes mellitus with diabetic chronic kidney disease: Secondary | ICD-10-CM | POA: Diagnosis not present

## 2020-01-28 DIAGNOSIS — D6869 Other thrombophilia: Secondary | ICD-10-CM | POA: Diagnosis not present

## 2020-01-28 DIAGNOSIS — E7800 Pure hypercholesterolemia, unspecified: Secondary | ICD-10-CM | POA: Diagnosis not present

## 2020-01-28 DIAGNOSIS — M48061 Spinal stenosis, lumbar region without neurogenic claudication: Secondary | ICD-10-CM | POA: Diagnosis not present

## 2020-01-28 DIAGNOSIS — I25810 Atherosclerosis of coronary artery bypass graft(s) without angina pectoris: Secondary | ICD-10-CM | POA: Diagnosis not present

## 2020-01-28 DIAGNOSIS — I10 Essential (primary) hypertension: Secondary | ICD-10-CM | POA: Diagnosis not present

## 2020-01-28 DIAGNOSIS — Z23 Encounter for immunization: Secondary | ICD-10-CM | POA: Diagnosis not present

## 2020-01-28 DIAGNOSIS — N1832 Chronic kidney disease, stage 3b: Secondary | ICD-10-CM | POA: Diagnosis not present

## 2020-01-28 DIAGNOSIS — K219 Gastro-esophageal reflux disease without esophagitis: Secondary | ICD-10-CM | POA: Diagnosis not present

## 2020-01-28 DIAGNOSIS — I050 Rheumatic mitral stenosis: Secondary | ICD-10-CM | POA: Diagnosis not present

## 2020-01-28 DIAGNOSIS — E0842 Diabetes mellitus due to underlying condition with diabetic polyneuropathy: Secondary | ICD-10-CM | POA: Diagnosis not present

## 2020-01-28 DIAGNOSIS — Z7984 Long term (current) use of oral hypoglycemic drugs: Secondary | ICD-10-CM | POA: Diagnosis not present

## 2020-02-05 ENCOUNTER — Ambulatory Visit (INDEPENDENT_AMBULATORY_CARE_PROVIDER_SITE_OTHER): Payer: Medicare Other

## 2020-02-05 DIAGNOSIS — I4891 Unspecified atrial fibrillation: Secondary | ICD-10-CM | POA: Diagnosis not present

## 2020-02-06 ENCOUNTER — Ambulatory Visit (INDEPENDENT_AMBULATORY_CARE_PROVIDER_SITE_OTHER): Payer: Medicare Other | Admitting: Pharmacist

## 2020-02-06 ENCOUNTER — Other Ambulatory Visit: Payer: Self-pay

## 2020-02-06 DIAGNOSIS — Z5181 Encounter for therapeutic drug level monitoring: Secondary | ICD-10-CM | POA: Diagnosis not present

## 2020-02-06 DIAGNOSIS — I4891 Unspecified atrial fibrillation: Secondary | ICD-10-CM

## 2020-02-06 LAB — CUP PACEART REMOTE DEVICE CHECK
Battery Impedance: 3823 Ohm
Battery Remaining Longevity: 15 mo
Battery Voltage: 2.7 V
Brady Statistic RV Percent Paced: 99 %
Date Time Interrogation Session: 20211208172355
Implantable Lead Implant Date: 20140807
Implantable Lead Location: 753860
Implantable Lead Model: 5092
Implantable Pulse Generator Implant Date: 20140807
Lead Channel Impedance Value: 0 Ohm
Lead Channel Impedance Value: 540 Ohm
Lead Channel Pacing Threshold Amplitude: 0.75 V
Lead Channel Pacing Threshold Pulse Width: 0.4 ms
Lead Channel Setting Pacing Amplitude: 2.5 V
Lead Channel Setting Pacing Pulse Width: 0.4 ms
Lead Channel Setting Sensing Sensitivity: 2 mV

## 2020-02-06 LAB — POCT INR: INR: 2.5 (ref 2.0–3.0)

## 2020-02-06 NOTE — Patient Instructions (Signed)
Description   Continue taking Warfarin 1 tablet (2.5mg) every day except 1/2 tablet (1.25mg) on Saturdays.  Recheck in 4 weeks. Call us if any new medications are added or changed. [phone]      

## 2020-02-17 ENCOUNTER — Encounter: Payer: Self-pay | Admitting: Podiatry

## 2020-02-17 ENCOUNTER — Other Ambulatory Visit: Payer: Self-pay

## 2020-02-17 ENCOUNTER — Ambulatory Visit (INDEPENDENT_AMBULATORY_CARE_PROVIDER_SITE_OTHER): Payer: Medicare Other | Admitting: Podiatry

## 2020-02-17 DIAGNOSIS — M79675 Pain in left toe(s): Secondary | ICD-10-CM

## 2020-02-17 DIAGNOSIS — M79674 Pain in right toe(s): Secondary | ICD-10-CM

## 2020-02-17 DIAGNOSIS — B351 Tinea unguium: Secondary | ICD-10-CM | POA: Diagnosis not present

## 2020-02-17 DIAGNOSIS — D689 Coagulation defect, unspecified: Secondary | ICD-10-CM | POA: Diagnosis not present

## 2020-02-17 DIAGNOSIS — E1159 Type 2 diabetes mellitus with other circulatory complications: Secondary | ICD-10-CM | POA: Diagnosis not present

## 2020-02-17 NOTE — Progress Notes (Signed)
This patient returns to my office for at risk foot care.  This patient requires this care by a professional since this patient will be at risk due to having diabetes and chronic kidney disease.  Patient is taking coumadin.  This patient is unable to cut nails himself since the patient cannot reach his nails.These nails are painful walking and wearing shoes.  This patient presents for at risk foot care today.  General Appearance  Alert, conversant and in no acute stress.  Vascular  Dorsalis pedis and posterior tibial  pulses are not  palpable  bilaterally.  Capillary return is within normal limits  bilaterally. Cold feet.  Bilaterally. Absent digital hair.  Neurologic  Senn-Weinstein monofilament wire test within normal limits  bilaterally. Muscle power within normal limits bilaterally.  Nails Thick disfigured discolored nails with subungual debris  from hallux to fifth toes bilaterally. No evidence of bacterial infection or drainage bilaterally.  Orthopedic  No limitations of motion  feet .  No crepitus or effusions noted.  No bony pathology or digital deformities noted.  Skin  Thin  skin with no porokeratosis noted bilaterally.  No signs of infections or ulcers noted.   Asymptomatic clavi third toe right foot.  Onychomycosis  Pain in right toes  Pain in left toes  Consent was obtained for treatment procedures.   Mechanical debridement of nails 1-5  bilaterally performed with a nail nipper.  Filed with dremel without incident.    Return office visit   3 months                  Told patient to return for periodic foot care and evaluation due to potential at risk complications.   Gardiner Barefoot DPM

## 2020-02-18 NOTE — Progress Notes (Signed)
Remote pacemaker transmission.   

## 2020-02-26 DIAGNOSIS — Z961 Presence of intraocular lens: Secondary | ICD-10-CM | POA: Diagnosis not present

## 2020-02-26 DIAGNOSIS — H5201 Hypermetropia, right eye: Secondary | ICD-10-CM | POA: Diagnosis not present

## 2020-02-26 DIAGNOSIS — H25813 Combined forms of age-related cataract, bilateral: Secondary | ICD-10-CM | POA: Diagnosis not present

## 2020-02-26 DIAGNOSIS — Z7984 Long term (current) use of oral hypoglycemic drugs: Secondary | ICD-10-CM | POA: Diagnosis not present

## 2020-02-26 DIAGNOSIS — H26492 Other secondary cataract, left eye: Secondary | ICD-10-CM | POA: Diagnosis not present

## 2020-02-26 DIAGNOSIS — E119 Type 2 diabetes mellitus without complications: Secondary | ICD-10-CM | POA: Diagnosis not present

## 2020-02-26 DIAGNOSIS — H52221 Regular astigmatism, right eye: Secondary | ICD-10-CM | POA: Diagnosis not present

## 2020-02-26 DIAGNOSIS — H524 Presbyopia: Secondary | ICD-10-CM | POA: Diagnosis not present

## 2020-03-19 ENCOUNTER — Other Ambulatory Visit: Payer: Self-pay

## 2020-03-19 ENCOUNTER — Ambulatory Visit (INDEPENDENT_AMBULATORY_CARE_PROVIDER_SITE_OTHER): Payer: Medicare Other | Admitting: *Deleted

## 2020-03-19 DIAGNOSIS — I4891 Unspecified atrial fibrillation: Secondary | ICD-10-CM | POA: Diagnosis not present

## 2020-03-19 DIAGNOSIS — Z5181 Encounter for therapeutic drug level monitoring: Secondary | ICD-10-CM | POA: Diagnosis not present

## 2020-03-19 LAB — POCT INR: INR: 2.2 (ref 2.0–3.0)

## 2020-03-19 NOTE — Patient Instructions (Signed)
Description   Continue taking Warfarin 1 tablet (2.5mg ) every day except 1/2 tablet (1.25mg ) on Saturdays.  Recheck in 6 weeks. Call us if any new medications are added or changed. [phone]

## 2020-04-08 ENCOUNTER — Other Ambulatory Visit: Payer: Self-pay | Admitting: Interventional Cardiology

## 2020-04-30 ENCOUNTER — Other Ambulatory Visit: Payer: Self-pay

## 2020-04-30 ENCOUNTER — Ambulatory Visit (INDEPENDENT_AMBULATORY_CARE_PROVIDER_SITE_OTHER): Payer: Medicare Other

## 2020-04-30 DIAGNOSIS — I4891 Unspecified atrial fibrillation: Secondary | ICD-10-CM | POA: Diagnosis not present

## 2020-04-30 DIAGNOSIS — Z5181 Encounter for therapeutic drug level monitoring: Secondary | ICD-10-CM

## 2020-04-30 LAB — POCT INR: INR: 2.3 (ref 2.0–3.0)

## 2020-04-30 NOTE — Patient Instructions (Signed)
Description   Continue taking Warfarin 1 tablet (2.5mg ) every day except 1/2 tablet (1.25mg ) on Saturdays.  Recheck in 6 weeks. Call us if any new medications are added or changed. [phone]

## 2020-05-14 DIAGNOSIS — K219 Gastro-esophageal reflux disease without esophagitis: Secondary | ICD-10-CM | POA: Diagnosis not present

## 2020-05-14 DIAGNOSIS — E1142 Type 2 diabetes mellitus with diabetic polyneuropathy: Secondary | ICD-10-CM | POA: Diagnosis not present

## 2020-05-14 DIAGNOSIS — N1832 Chronic kidney disease, stage 3b: Secondary | ICD-10-CM | POA: Diagnosis not present

## 2020-05-14 DIAGNOSIS — N400 Enlarged prostate without lower urinary tract symptoms: Secondary | ICD-10-CM | POA: Diagnosis not present

## 2020-05-14 DIAGNOSIS — M21372 Foot drop, left foot: Secondary | ICD-10-CM | POA: Diagnosis not present

## 2020-05-14 DIAGNOSIS — I25810 Atherosclerosis of coronary artery bypass graft(s) without angina pectoris: Secondary | ICD-10-CM | POA: Diagnosis not present

## 2020-05-14 DIAGNOSIS — I482 Chronic atrial fibrillation, unspecified: Secondary | ICD-10-CM | POA: Diagnosis not present

## 2020-05-14 DIAGNOSIS — E1122 Type 2 diabetes mellitus with diabetic chronic kidney disease: Secondary | ICD-10-CM | POA: Diagnosis not present

## 2020-05-14 DIAGNOSIS — E7800 Pure hypercholesterolemia, unspecified: Secondary | ICD-10-CM | POA: Diagnosis not present

## 2020-05-14 DIAGNOSIS — I050 Rheumatic mitral stenosis: Secondary | ICD-10-CM | POA: Diagnosis not present

## 2020-05-14 DIAGNOSIS — Z952 Presence of prosthetic heart valve: Secondary | ICD-10-CM | POA: Diagnosis not present

## 2020-05-14 DIAGNOSIS — M48061 Spinal stenosis, lumbar region without neurogenic claudication: Secondary | ICD-10-CM | POA: Diagnosis not present

## 2020-05-14 DIAGNOSIS — I10 Essential (primary) hypertension: Secondary | ICD-10-CM | POA: Diagnosis not present

## 2020-05-18 ENCOUNTER — Ambulatory Visit (INDEPENDENT_AMBULATORY_CARE_PROVIDER_SITE_OTHER): Payer: Medicare Other | Admitting: Podiatry

## 2020-05-18 ENCOUNTER — Other Ambulatory Visit: Payer: Self-pay

## 2020-05-18 ENCOUNTER — Encounter: Payer: Self-pay | Admitting: Podiatry

## 2020-05-18 DIAGNOSIS — E1159 Type 2 diabetes mellitus with other circulatory complications: Secondary | ICD-10-CM

## 2020-05-18 DIAGNOSIS — M79675 Pain in left toe(s): Secondary | ICD-10-CM | POA: Diagnosis not present

## 2020-05-18 DIAGNOSIS — M79674 Pain in right toe(s): Secondary | ICD-10-CM | POA: Diagnosis not present

## 2020-05-18 DIAGNOSIS — B351 Tinea unguium: Secondary | ICD-10-CM | POA: Diagnosis not present

## 2020-05-18 DIAGNOSIS — D689 Coagulation defect, unspecified: Secondary | ICD-10-CM

## 2020-05-18 NOTE — Progress Notes (Signed)
This patient returns to my office for at risk foot care.  This patient requires this care by a professional since this patient will be at risk due to having diabetes and chronic kidney disease.  Patient is taking coumadin.  This patient is unable to cut nails himself since the patient cannot reach his nails.These nails are painful walking and wearing shoes.  This patient presents for at risk foot care today.  General Appearance  Alert, conversant and in no acute stress.  Vascular  Dorsalis pedis and posterior tibial  pulses are not  palpable  bilaterally.  Capillary return is within normal limits  bilaterally. Cold feet.  Bilaterally. Absent digital hair. Swelling 2-3+  Neurologic  Senn-Weinstein monofilament wire test within normal limits  bilaterally. Muscle power within normal limits bilaterally.  Nails Thick disfigured discolored nails with subungual debris  from hallux to fifth toes bilaterally. No evidence of bacterial infection or drainage bilaterally.  Orthopedic  No limitations of motion  feet .  No crepitus or effusions noted.  No bony pathology or digital deformities noted.  Skin  Thin  skin with no porokeratosis noted bilaterally.  No signs of infections or ulcers noted.   Asymptomatic clavi third toe right foot.  Onychomycosis  Pain in right toes  Pain in left toes  Consent was obtained for treatment procedures.   Mechanical debridement of nails 1-5  bilaterally performed with a nail nipper.  Filed with dremel without incident.    Return office visit   3 months                  Told patient to return for periodic foot care and evaluation due to potential at risk complications.   Gardiner Barefoot DPM

## 2020-05-29 ENCOUNTER — Ambulatory Visit (INDEPENDENT_AMBULATORY_CARE_PROVIDER_SITE_OTHER): Payer: Medicare Other

## 2020-05-29 DIAGNOSIS — I441 Atrioventricular block, second degree: Secondary | ICD-10-CM | POA: Diagnosis not present

## 2020-06-03 LAB — CUP PACEART REMOTE DEVICE CHECK
Battery Impedance: 4221 Ohm
Battery Remaining Longevity: 13 mo
Battery Voltage: 2.69 V
Brady Statistic RV Percent Paced: 99 %
Date Time Interrogation Session: 20220401124204
Implantable Lead Implant Date: 20140807
Implantable Lead Location: 753860
Implantable Lead Model: 5092
Implantable Pulse Generator Implant Date: 20140807
Lead Channel Impedance Value: 0 Ohm
Lead Channel Impedance Value: 530 Ohm
Lead Channel Pacing Threshold Amplitude: 0.875 V
Lead Channel Pacing Threshold Pulse Width: 0.4 ms
Lead Channel Setting Pacing Amplitude: 2.5 V
Lead Channel Setting Pacing Pulse Width: 0.4 ms
Lead Channel Setting Sensing Sensitivity: 2 mV

## 2020-06-10 NOTE — Progress Notes (Signed)
Remote pacemaker transmission.   

## 2020-06-11 ENCOUNTER — Other Ambulatory Visit: Payer: Self-pay

## 2020-06-11 ENCOUNTER — Ambulatory Visit (INDEPENDENT_AMBULATORY_CARE_PROVIDER_SITE_OTHER): Payer: Medicare Other | Admitting: Pharmacist

## 2020-06-11 DIAGNOSIS — Z5181 Encounter for therapeutic drug level monitoring: Secondary | ICD-10-CM | POA: Diagnosis not present

## 2020-06-11 DIAGNOSIS — I4891 Unspecified atrial fibrillation: Secondary | ICD-10-CM | POA: Diagnosis not present

## 2020-06-11 LAB — POCT INR: INR: 2.9 (ref 2.0–3.0)

## 2020-06-11 NOTE — Patient Instructions (Signed)
Description   Continue taking Warfarin 1 tablet (2.5mg ) every day except 1/2 tablet (1.25mg ) on Saturdays.  Recheck in 6 weeks. Call us if any new medications are added or changed. [phone]

## 2020-06-26 ENCOUNTER — Telehealth: Payer: Self-pay | Admitting: Interventional Cardiology

## 2020-06-26 MED ORDER — WARFARIN SODIUM 2.5 MG PO TABS: ORAL_TABLET | ORAL | 1 refills | Status: DC

## 2020-06-26 NOTE — Telephone Encounter (Signed)
Refer to Warfarin refill for details.

## 2020-06-26 NOTE — Telephone Encounter (Signed)
  *  STAT* If patient is at the pharmacy, call can be transferred to refill team.   1. Which medications need to be refilled? (please list name of each medication and dose if known)   warfarin (COUMADIN) 2.5 MG tablet    2. Which pharmacy/location (including street and city if local pharmacy) is medication to be sent to? [id number] - [place], [place]  3. Do they need a 30 day or 90 day supply? 90 days

## 2020-07-06 DIAGNOSIS — R946 Abnormal results of thyroid function studies: Secondary | ICD-10-CM | POA: Diagnosis not present

## 2020-07-23 ENCOUNTER — Other Ambulatory Visit: Payer: Self-pay

## 2020-07-23 ENCOUNTER — Ambulatory Visit (INDEPENDENT_AMBULATORY_CARE_PROVIDER_SITE_OTHER): Payer: Medicare Other

## 2020-07-23 DIAGNOSIS — I4891 Unspecified atrial fibrillation: Secondary | ICD-10-CM | POA: Diagnosis not present

## 2020-07-23 DIAGNOSIS — Z5181 Encounter for therapeutic drug level monitoring: Secondary | ICD-10-CM

## 2020-07-23 LAB — POCT INR: INR: 2.8 (ref 2.0–3.0)

## 2020-07-23 NOTE — Patient Instructions (Signed)
Description   Continue taking Warfarin 1 tablet (2.5mg ) every day except 1/2 tablet (1.25mg ) on Saturdays.  Recheck in 6 weeks. Call us if any new medications are added or changed. [phone]

## 2020-08-20 ENCOUNTER — Ambulatory Visit (INDEPENDENT_AMBULATORY_CARE_PROVIDER_SITE_OTHER): Payer: Medicare Other | Admitting: Podiatry

## 2020-08-20 ENCOUNTER — Encounter: Payer: Self-pay | Admitting: Podiatry

## 2020-08-20 ENCOUNTER — Other Ambulatory Visit: Payer: Self-pay

## 2020-08-20 DIAGNOSIS — B351 Tinea unguium: Secondary | ICD-10-CM

## 2020-08-20 DIAGNOSIS — M79675 Pain in left toe(s): Secondary | ICD-10-CM | POA: Diagnosis not present

## 2020-08-20 DIAGNOSIS — E1159 Type 2 diabetes mellitus with other circulatory complications: Secondary | ICD-10-CM

## 2020-08-20 DIAGNOSIS — M79674 Pain in right toe(s): Secondary | ICD-10-CM | POA: Diagnosis not present

## 2020-08-20 DIAGNOSIS — D689 Coagulation defect, unspecified: Secondary | ICD-10-CM

## 2020-08-20 NOTE — Progress Notes (Signed)
This patient returns to my office for at risk foot care.  This patient requires this care by a professional since this patient will be at risk due to having diabetes and chronic kidney disease.  Patient is taking coumadin.  This patient is unable to cut nails himself since the patient cannot reach his nails.These nails are painful walking and wearing shoes.  This patient presents for at risk foot care today.  General Appearance  Alert, conversant and in no acute stress.  Vascular  Dorsalis pedis and posterior tibial  pulses are not  palpable  bilaterally.  Capillary return is within normal limits  bilaterally. Cold feet.  Bilaterally. Absent digital hair. Swelling 2-3+  Neurologic  Senn-Weinstein monofilament wire test within normal limits  bilaterally. Muscle power within normal limits bilaterally.  Nails Thick disfigured discolored nails with subungual debris  from hallux to fifth toes bilaterally. No evidence of bacterial infection or drainage bilaterally.  Orthopedic  No limitations of motion  feet .  No crepitus or effusions noted.  No bony pathology or digital deformities noted.  Skin  Thin  skin with no porokeratosis noted bilaterally.  No signs of infections or ulcers noted.   Asymptomatic clavi third toe right foot.  Onychomycosis  Pain in right toes  Pain in left toes  Consent was obtained for treatment procedures.   Mechanical debridement of nails 1-5  bilaterally performed with a nail nipper.  Filed with dremel without incident.    Return office visit   3 months                  Told patient to return for periodic foot care and evaluation due to potential at risk complications.   Gardiner Barefoot DPM

## 2020-08-28 ENCOUNTER — Ambulatory Visit (INDEPENDENT_AMBULATORY_CARE_PROVIDER_SITE_OTHER): Payer: Medicare Other

## 2020-08-28 DIAGNOSIS — I441 Atrioventricular block, second degree: Secondary | ICD-10-CM

## 2020-08-31 LAB — CUP PACEART REMOTE DEVICE CHECK
Battery Impedance: 4687 Ohm
Battery Remaining Longevity: 10 mo
Battery Voltage: 2.68 V
Brady Statistic RV Percent Paced: 99 %
Date Time Interrogation Session: 20220701133544
Implantable Lead Implant Date: 20140807
Implantable Lead Location: 753860
Implantable Lead Model: 5092
Implantable Pulse Generator Implant Date: 20140807
Lead Channel Impedance Value: 0 Ohm
Lead Channel Impedance Value: 529 Ohm
Lead Channel Pacing Threshold Amplitude: 0.75 V
Lead Channel Pacing Threshold Pulse Width: 0.4 ms
Lead Channel Setting Pacing Amplitude: 2.5 V
Lead Channel Setting Pacing Pulse Width: 0.4 ms
Lead Channel Setting Sensing Sensitivity: 2 mV

## 2020-09-01 NOTE — Progress Notes (Signed)
Cardiology Office Note   Date:  09/02/2020   ID:  [name], DOB 1926/02/25, MRN [id number]  PCP:  Wenda Low, MD    No chief complaint on file.  CAD  Wt Readings from Last 3 Encounters:  09/02/20 169 lb 9.6 oz (76.9 kg)  07/15/19 180 lb (81.6 kg)  11/28/17 190 lb 12.8 oz (86.5 kg)       History of Present Illness: [name] is a 87 y.o. male   with history of CAD status post CABG 1 in aVR in 2006. He has chronic atrial fibrillation CHADSVASC=5 on Coumadin and bradycardia treated with pacemaker in 2014, hypertension, DM type II.   Patient was hospitalized 08/2016 with sepsis question secondary to cellulitis. Also had acute on chronic CK D stage III creatinine on admission 2.01 was 1.48 and had slight pulmonary edema on chest x-ray. ACE inhibitor was held in the hospital   12/12/16 with significant CHF with weeping of his upper and lower extremities. 2-D echo 12/07/16 LVEF 60-65% with high ventricular filling pressures and moderate mitral stenosis and mild MR and normally functioning aortic valve. BNP was 1801. Creatinine 1.38. I stopped his Lasix and start torsemide 40 mg twice a day for 3 days and 40 mg daily. Weight was 220 pounds that date. I saw back 12/19/16 he was feeling a little better. He had lost 10 pounds in the first 3 days but then the swelling started to come back. Increased his Demadex to 40 mg twice a day. BNP that day was 1485 and creatinine was up to 1.62.     Swelling improved with diuretics.  Cr stable in November 2018.    He again declined statin in 2019. He has tolerated Coumadin.   He had COVID in 4/21.  He was treated with antibody treatment: " The patient agreed to proceed with receiving the bamlanivimab/etesevimab infusion"   In 5/21, he had some sort of tick fever.  He has done well.    Denies : Chest pain. Dizziness. Leg edema. Nitroglycerin use. Orthopnea. Palpitations. Paroxysmal nocturnal dyspnea. Shortness of breath. Syncope.     No bleeding issues.  Warfarin has been well controlled.    Past Medical History:  Diagnosis Date   Aortic stenosis    BPH (benign prostatic hypertrophy)    Coronary artery disease    Dermatophytosis of nail    Diabetes mellitus without complication (HCC)    type 2   Diverticulosis    DVT (deep venous thrombosis) (HCC)    Gastroesophageal reflux disease    GERD (gastroesophageal reflux disease)    Gout    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    Kidney stones    PAH (pulmonary artery hypertension) ([place])    PAS 63 on ECHO 09/2012   Permanent atrial fibrillation (HCC)    PVD (peripheral vascular disease) (HCC)    Shortness of breath    Symptomatic bradycardia 09-2012   s/p Medtronic pacemaker implanted by Dr Rayann Heman     Past Surgical History:  Procedure Laterality Date   AORTIC VALVE REPLACEMENT  2006   BALLOON DILATION N/A 12/16/2013   Procedure: BALLOON DILATION;  Surgeon: Garlan Fair, MD;  Location: [facility] ENDOSCOPY;  Service: Endoscopy;  Laterality: N/A;   CORONARY ARTERY BYPASS GRAFT     ESOPHAGOGASTRODUODENOSCOPY N/A 12/16/2013   Procedure: ESOPHAGOGASTRODUODENOSCOPY (EGD);  Surgeon: Garlan Fair, MD;  Location: [facility];  Service: Endoscopy;  Laterality: N/A;   HERNIA REPAIR  PACEMAKER INSERTION  10-04-2012   Medtronic pacemaker implanted by Dr Rayann Heman for symptomatic bradycardia   PARAESOPHAGEAL HERNIA REPAIR     PERMANENT PACEMAKER INSERTION N/A 10/04/2012   Procedure: PERMANENT PACEMAKER INSERTION;  Surgeon: Thompson Grayer, MD;  Location: [facility] CATH LAB;  Service: Cardiovascular;  Laterality: N/A;   TEE WITHOUT CARDIOVERSION N/A 12/25/2014   Procedure: TRANSESOPHAGEAL ECHOCARDIOGRAM (TEE)   (inpatient at [facility]);  Surgeon: Skeet Latch, MD;  Location: [facility] ENDOSCOPY;  Service: Cardiovascular;  Laterality: N/A;     Current Outpatient Medications  Medication Sig Dispense Refill   acetaminophen (TYLENOL) 325 MG tablet Take 325-650 mg by mouth every 6 (six)  hours as needed for mild pain or headache.     Coenzyme Q10 (CO Q-10) 100 MG CAPS 1 capsule with a meal     Elastic Bandages & Supports (V-2 HIGH COMPRESSION HOSE) MISC daily.     famotidine (PEPCID) 20 MG tablet 1 tablet     glimepiride (AMARYL) 1 MG tablet Take 1 mg by mouth 2 (two) times daily.      ketoconazole (NIZORAL) 2 % cream 1 application to affected area     nitroGLYCERIN (NITROSTAT) 0.4 MG SL tablet Place 1 tablet (0.4 mg total) under the tongue every 5 (five) minutes x 3 doses as needed for chest pain. 25 tablet 6   tamsulosin (FLOMAX) 0.4 MG CAPS capsule Take 1 capsule (0.4 mg total) by mouth daily. 30 capsule 0   torsemide (DEMADEX) 20 MG tablet 1 tablet     warfarin (COUMADIN) 2.5 MG tablet TAKE 1 TABLET BY MOUTH EVERY DAY AS DIRECTED PER COUMADIN CLINIC 90 tablet 1   No current facility-administered medications for this visit.    Allergies:   Demerol [meperidine], Meperidine hcl, Nasal spray, and Simvastatin    Social History:  The patient  reports that he has never smoked. He has never used smokeless tobacco. He reports that he does not drink alcohol and does not use drugs.   Family History:  The patient's family history includes Cancer - Other in his father and mother.    ROS:  Please see the history of present illness.   Otherwise, review of systems are positive for balance problems.   All other systems are reviewed and negative.    PHYSICAL EXAM: VS:  BP (!) 110/58   Pulse 80   Ht 5\' 9"  (1.753 m)   Wt 169 lb 9.6 oz (76.9 kg)   SpO2 94%   BMI 25.05 kg/m  , BMI Body mass index is 25.05 kg/m. GEN: Well nourished, well developed, in no acute distress HEENT: normal Neck: no JVD, carotid bruits, or masses Cardiac: RRR; no murmurs, rubs, or gallops,no edema  Respiratory:  clear to auscultation bilaterally, normal work of breathing GI: soft, nontender, nondistended, + BS MS: no deformity or atrophy Skin: warm and dry, no rash Neuro:  Strength and sensation are  intact Psych: euthymic mood, full affect   EKG:   The ekg ordered today demonstrates A sensed, V paced rhythm   Recent Labs: No results found for requested labs within last 8760 hours.   Lipid Panel No results found for: CHOL, TRIG, HDL, CHOLHDL, VLDL, LDLCALC, LDLDIRECT   Other studies Reviewed: Additional studies/ records that were reviewed today with results demonstrating: Labs reviewed.  LDL 143.   ASSESSMENT AND PLAN:  CAD: No angina. Continue aggressive secondary prevention. LDL above target. He has declined statins.  Was intolerant of simvastatin in the past. AFib: Coumadin or stroke  prevention.  No bleeding problems. Chronic systolic/diastolic heart failure: Appears euvolemic.  He has chronic lower extremity edema but no apparent extra fluid in chest cavity.  Elevate legs when possible. S/p AVR: SBE prophylaxis.   DM: A1C 8.0 in 2022.  Limited on greens but trying to eat other sources of fiber.  Followed with primary care doctor to help regulate blood sugars. Pacer: Follows with EP.  Needs follow-up appointment with Dr. Rayann Heman. Anticoagulated: INR to be checked today.   Current medicines are reviewed at length with the patient today.  The patient concerns regarding his medicines were addressed.  The following changes have been made:  No change  Labs/ tests ordered today include:  No orders of the defined types were placed in this encounter.   Recommend 150 minutes/week of aerobic exercise Low fat, low carb, high fiber diet recommended  Disposition:   FU in 1 year   Signed, Larae Grooms, MD  09/02/2020 9:50 AM    [facility] [place], [place], [place]  [address] Phone: [phone]; Fax: [phone]

## 2020-09-02 ENCOUNTER — Encounter: Payer: Self-pay | Admitting: Interventional Cardiology

## 2020-09-02 ENCOUNTER — Ambulatory Visit (INDEPENDENT_AMBULATORY_CARE_PROVIDER_SITE_OTHER): Payer: Medicare Other | Admitting: *Deleted

## 2020-09-02 ENCOUNTER — Other Ambulatory Visit: Payer: Self-pay

## 2020-09-02 ENCOUNTER — Ambulatory Visit (INDEPENDENT_AMBULATORY_CARE_PROVIDER_SITE_OTHER): Payer: Medicare Other | Admitting: Interventional Cardiology

## 2020-09-02 VITALS — BP 110/58 | HR 80 | Ht 69.0 in | Wt 169.6 lb

## 2020-09-02 DIAGNOSIS — Z952 Presence of prosthetic heart valve: Secondary | ICD-10-CM | POA: Diagnosis not present

## 2020-09-02 DIAGNOSIS — I4891 Unspecified atrial fibrillation: Secondary | ICD-10-CM | POA: Diagnosis not present

## 2020-09-02 DIAGNOSIS — N183 Chronic kidney disease, stage 3 unspecified: Secondary | ICD-10-CM

## 2020-09-02 DIAGNOSIS — Z5181 Encounter for therapeutic drug level monitoring: Secondary | ICD-10-CM

## 2020-09-02 DIAGNOSIS — Z7901 Long term (current) use of anticoagulants: Secondary | ICD-10-CM | POA: Diagnosis not present

## 2020-09-02 DIAGNOSIS — Z950 Presence of cardiac pacemaker: Secondary | ICD-10-CM | POA: Diagnosis not present

## 2020-09-02 DIAGNOSIS — I2510 Atherosclerotic heart disease of native coronary artery without angina pectoris: Secondary | ICD-10-CM

## 2020-09-02 DIAGNOSIS — I5042 Chronic combined systolic (congestive) and diastolic (congestive) heart failure: Secondary | ICD-10-CM | POA: Diagnosis not present

## 2020-09-02 DIAGNOSIS — I4821 Permanent atrial fibrillation: Secondary | ICD-10-CM

## 2020-09-02 LAB — POCT INR: INR: 2.7 (ref 2.0–3.0)

## 2020-09-02 NOTE — Patient Instructions (Signed)
Description   Continue taking Warfarin 1 tablet (2.5mg ) every day except 1/2 tablet (1.25mg ) on Saturdays.  Recheck in 4 weeks. Call us if any new medications are added or changed. [phone]

## 2020-09-02 NOTE — Patient Instructions (Signed)
Medication Instructions:  Your physician recommends that you continue on your current medications as directed. Please refer to the Current Medication list given to you today.  *If you need a refill on your cardiac medications before your next appointment, please call your pharmacy*   Lab Work: None today If you have labs (blood work) drawn today and your tests are completely normal, you will receive your results only by: [place] (if you have MyChart) OR A paper copy in the mail If you have any lab test that is abnormal or we need to change your treatment, we will call you to review the results.   Testing/Procedures: None today   Follow-Up: At [facility], you and your health needs are our priority.  As part of our continuing mission to provide you with exceptional heart care, we have created designated Provider Care Teams.  These Care Teams include your primary Cardiologist (physician) and Advanced Practice Providers (APPs -  Physician Assistants and Nurse Practitioners) who all work together to provide you with the care you need, when you need it.  We recommend signing up for the patient portal called "MyChart".  Sign up information is provided on this After Visit Summary.  MyChart is used to connect with patients for Virtual Visits (Telemedicine).  Patients are able to view lab/test results, encounter notes, upcoming appointments, etc.  Non-urgent messages can be sent to your provider as well.   To learn more about what you can do with MyChart, go to [email or web address].    Your next appointment:   1 year(s)  The format for your next appointment:   In Person  Provider:      Other Instructions Please take TORSEMIDE in the mornings, and you will be sent to  check out to establish appointment to see Dr. Rayann Heman to check on your pacemaker. Thank you.

## 2020-09-16 ENCOUNTER — Other Ambulatory Visit: Payer: Self-pay | Admitting: Interventional Cardiology

## 2020-09-17 NOTE — Progress Notes (Signed)
Remote pacemaker transmission.   

## 2020-09-30 ENCOUNTER — Ambulatory Visit (INDEPENDENT_AMBULATORY_CARE_PROVIDER_SITE_OTHER): Payer: Medicare Other

## 2020-09-30 ENCOUNTER — Other Ambulatory Visit: Payer: Self-pay

## 2020-09-30 DIAGNOSIS — Z5181 Encounter for therapeutic drug level monitoring: Secondary | ICD-10-CM

## 2020-09-30 DIAGNOSIS — I4891 Unspecified atrial fibrillation: Secondary | ICD-10-CM

## 2020-09-30 LAB — POCT INR: INR: 3.1 — AB (ref 2.0–3.0)

## 2020-09-30 NOTE — Patient Instructions (Addendum)
Description   Take half tablet tonight and then continue taking Warfarin 1 tablet every day except 1/2 tablet on Saturdays.  Recheck in 2 weeks. Call us if any new medications are added or changed. [phone]

## 2020-10-14 ENCOUNTER — Other Ambulatory Visit: Payer: Self-pay

## 2020-10-14 ENCOUNTER — Ambulatory Visit (INDEPENDENT_AMBULATORY_CARE_PROVIDER_SITE_OTHER): Payer: Medicare Other

## 2020-10-14 ENCOUNTER — Ambulatory Visit (INDEPENDENT_AMBULATORY_CARE_PROVIDER_SITE_OTHER): Payer: Medicare Other | Admitting: Internal Medicine

## 2020-10-14 VITALS — BP 132/70 | HR 78 | Ht 71.0 in | Wt 171.0 lb

## 2020-10-14 DIAGNOSIS — I441 Atrioventricular block, second degree: Secondary | ICD-10-CM | POA: Diagnosis not present

## 2020-10-14 DIAGNOSIS — I10 Essential (primary) hypertension: Secondary | ICD-10-CM | POA: Diagnosis not present

## 2020-10-14 DIAGNOSIS — I4821 Permanent atrial fibrillation: Secondary | ICD-10-CM

## 2020-10-14 DIAGNOSIS — I2510 Atherosclerotic heart disease of native coronary artery without angina pectoris: Secondary | ICD-10-CM | POA: Diagnosis not present

## 2020-10-14 DIAGNOSIS — Z5181 Encounter for therapeutic drug level monitoring: Secondary | ICD-10-CM | POA: Diagnosis not present

## 2020-10-14 DIAGNOSIS — Z952 Presence of prosthetic heart valve: Secondary | ICD-10-CM | POA: Diagnosis not present

## 2020-10-14 LAB — POCT INR: INR: 2.1 (ref 2.0–3.0)

## 2020-10-14 NOTE — Patient Instructions (Signed)
Description   Continue taking Warfarin 1 tablet every day except 1/2 tablet on Saturdays.  Recheck in 3 weeks. Call us if any new medications are added or changed. [phone]

## 2020-10-14 NOTE — Patient Instructions (Addendum)
Medication Instructions:  Your physician recommends that you continue on your current medications as directed. Please refer to the Current Medication list given to you today.  Labwork: None ordered.  Testing/Procedures: None ordered.  Follow-Up: Your physician wants you to follow-up in: 07/19/21 at 9:30 am with Thompson Grayer, MD   Remote monitoring is used to monitor your Pacemaker from home. This monitoring reduces the number of office visits required to check your device to one time per year. It allows [place] to keep an eye on the functioning of your device to ensure it is working properly. You are scheduled for a device check from home on 10/27/20. You may send your transmission at any time that day. If you have a wireless device, the transmission will be sent automatically. After your physician reviews your transmission, you will receive a postcard with your next transmission date.  Any Other Special Instructions Will Be Listed Below (If Applicable).  If you need a refill on your cardiac medications before your next appointment, please call your pharmacy.

## 2020-10-14 NOTE — Progress Notes (Signed)
PCP: Wenda Low, MD Primary Cardiologist: Dr Irish Lack Primary EP:  Dr Jasmine December is a 87 y.o. male who presents today for routine electrophysiology followup.  Since last being seen in our clinic, the patient reports doing very well.  Today, he denies symptoms of palpitations, chest pain, shortness of breath,  lower extremity edema, dizziness, presyncope, or syncope.  The patient is otherwise without complaint today.   Past Medical History:  Diagnosis Date   Aortic stenosis    BPH (benign prostatic hypertrophy)    Coronary artery disease    Dermatophytosis of nail    Diabetes mellitus without complication (HCC)    type 2   Diverticulosis    DVT (deep venous thrombosis) (HCC)    Gastroesophageal reflux disease    GERD (gastroesophageal reflux disease)    Gout    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    Kidney stones    PAH (pulmonary artery hypertension) ([place])    PAS 63 on ECHO 09/2012   Permanent atrial fibrillation (HCC)    PVD (peripheral vascular disease) (HCC)    Shortness of breath    Symptomatic bradycardia 09-2012   s/p Medtronic pacemaker implanted by Dr Rayann Heman    Past Surgical History:  Procedure Laterality Date   AORTIC VALVE REPLACEMENT  2006   BALLOON DILATION N/A 12/16/2013   Procedure: BALLOON DILATION;  Surgeon: Garlan Fair, MD;  Location: [facility] ENDOSCOPY;  Service: Endoscopy;  Laterality: N/A;   CORONARY ARTERY BYPASS GRAFT     ESOPHAGOGASTRODUODENOSCOPY N/A 12/16/2013   Procedure: ESOPHAGOGASTRODUODENOSCOPY (EGD);  Surgeon: Garlan Fair, MD;  Location: [facility];  Service: Endoscopy;  Laterality: N/A;   HERNIA REPAIR     PACEMAKER INSERTION  10-04-2012   Medtronic pacemaker implanted by Dr Rayann Heman for symptomatic bradycardia   PARAESOPHAGEAL HERNIA REPAIR     PERMANENT PACEMAKER INSERTION N/A 10/04/2012   Procedure: PERMANENT PACEMAKER INSERTION;  Surgeon: Thompson Grayer, MD;  Location: [facility] CATH LAB;  Service: Cardiovascular;  Laterality:  N/A;   TEE WITHOUT CARDIOVERSION N/A 12/25/2014   Procedure: TRANSESOPHAGEAL ECHOCARDIOGRAM (TEE)   (inpatient at [facility]);  Surgeon: Skeet Latch, MD;  Location: [facility] ENDOSCOPY;  Service: Cardiovascular;  Laterality: N/A;    ROS- all systems are reviewed and negative except as per HPI above  Current Outpatient Medications  Medication Sig Dispense Refill   acetaminophen (TYLENOL) 325 MG tablet Take 325-650 mg by mouth every 6 (six) hours as needed for mild pain or headache.     Coenzyme Q10 (CO Q-10) 100 MG CAPS 1 capsule with a meal     Elastic Bandages & Supports (V-2 HIGH COMPRESSION HOSE) MISC daily.     famotidine (PEPCID) 20 MG tablet 1 tablet     glimepiride (AMARYL) 1 MG tablet Take 1 mg by mouth 2 (two) times daily.      ketoconazole (NIZORAL) 2 % cream 1 application to affected area     nitroGLYCERIN (NITROSTAT) 0.4 MG SL tablet Place 1 tablet (0.4 mg total) under the tongue every 5 (five) minutes x 3 doses as needed for chest pain. 25 tablet 6   tamsulosin (FLOMAX) 0.4 MG CAPS capsule Take 1 capsule (0.4 mg total) by mouth daily. 30 capsule 0   torsemide (DEMADEX) 20 MG tablet TAKE 2 TABLETS (40 MG TOTAL) BY MOUTH 2 (TWO) TIMES DAILY. 360 tablet 2   warfarin (COUMADIN) 2.5 MG tablet TAKE 1 TABLET BY MOUTH EVERY DAY AS DIRECTED PER COUMADIN CLINIC 90 tablet  1   No current facility-administered medications for this visit.    Physical Exam: Vitals:   10/14/20 1037  BP: 132/70  Pulse: 78  SpO2: 96%  Weight: 171 lb (77.6 kg)  Height: '5\' 11"'$  (1.803 m)    GEN- The patient is well appearing, alert and oriented x 3 today.   Head- normocephalic, atraumatic Eyes-  Sclera clear, conjunctiva pink Ears- hearing intact Oropharynx- clear Lungs- Clear to ausculation bilaterally, normal work of breathing Chest- pacemaker pocket is well healed Heart- Regular rate and rhythm, no murmurs, rubs or gallops, PMI not laterally displaced GI- soft, NT, ND, + BS Extremities- no clubbing,  cyanosis, or edema  Pacemaker interrogation- reviewed in detail today,  See PACEART report  Echo 12/07/16- EF 60%  ekg tracing ordered today is personally reviewed and shows afib, V paced  Assessment and Plan:  1. Symptomatic second degree heart block Normal pacemaker function See Pace Art report No changes today he is not device dependant today Approaching ERI.  We will follow battery monthly.  Once ERI, we will schedule generator change without an additional office visit required. Risks, benefits, and alternatives to PPM pulse generator replacement were discussed in detail today.  The patient understands that risks include but are not limited to bleeding, infection, pneumothorax, perforation, tamponade, vascular damage, renal failure, MI, stroke, death, damage to his existing leads, and lead dislodgement and wishes to proceed once ERI.    We would hold coumadin 48 hours prior to generator change.   2. Permanent afib Rate controlled Chads2vasc score is at least 5.  He also has had prior DVT He is on coumadin  3. HTN Currently controlled without medicine  4. CAD s/p CABG No ischemic symptoms No changes He has declined statins  5. S/p AVR Stable No change required today  Return to see me in 9 months unless he reaches ERI in the interim.  Thompson Grayer MD, [facility] 10/14/2020 10:44 AM

## 2020-10-27 ENCOUNTER — Ambulatory Visit (INDEPENDENT_AMBULATORY_CARE_PROVIDER_SITE_OTHER): Payer: Medicare Other

## 2020-10-27 DIAGNOSIS — I441 Atrioventricular block, second degree: Secondary | ICD-10-CM

## 2020-11-03 LAB — CUP PACEART REMOTE DEVICE CHECK
Battery Impedance: 4974 Ohm
Battery Remaining Longevity: 9 mo
Battery Voltage: 2.68 V
Brady Statistic RV Percent Paced: 99 %
Date Time Interrogation Session: 20220901202140
Implantable Lead Implant Date: 20140807
Implantable Lead Location: 753860
Implantable Lead Model: 5092
Implantable Pulse Generator Implant Date: 20140807
Lead Channel Impedance Value: 0 Ohm
Lead Channel Impedance Value: 544 Ohm
Lead Channel Pacing Threshold Amplitude: 0.625 V
Lead Channel Pacing Threshold Pulse Width: 0.4 ms
Lead Channel Setting Pacing Amplitude: 2.5 V
Lead Channel Setting Pacing Pulse Width: 0.4 ms
Lead Channel Setting Sensing Sensitivity: 2 mV

## 2020-11-04 ENCOUNTER — Ambulatory Visit (INDEPENDENT_AMBULATORY_CARE_PROVIDER_SITE_OTHER): Payer: Medicare Other | Admitting: *Deleted

## 2020-11-04 ENCOUNTER — Other Ambulatory Visit: Payer: Self-pay

## 2020-11-04 DIAGNOSIS — Z5181 Encounter for therapeutic drug level monitoring: Secondary | ICD-10-CM

## 2020-11-04 DIAGNOSIS — I4821 Permanent atrial fibrillation: Secondary | ICD-10-CM

## 2020-11-04 LAB — POCT INR: INR: 3.7 — AB (ref 2.0–3.0)

## 2020-11-04 NOTE — Patient Instructions (Addendum)
Description   Do not take any Warfarin tonight then continue taking Warfarin 1 tablet every day except 1/2 tablet on Saturdays.  Recheck in 3 weeks. Call us if any new medications are added or changed. [phone]

## 2020-11-09 NOTE — Addendum Note (Signed)
Addended by: Cheri Kearns A on: 11/09/2020 03:23 PM   Modules accepted: Level of Service

## 2020-11-09 NOTE — Progress Notes (Signed)
Remote pacemaker transmission.   

## 2020-11-25 ENCOUNTER — Other Ambulatory Visit: Payer: Self-pay

## 2020-11-25 ENCOUNTER — Ambulatory Visit (INDEPENDENT_AMBULATORY_CARE_PROVIDER_SITE_OTHER): Payer: Medicare Other | Admitting: *Deleted

## 2020-11-25 DIAGNOSIS — Z5181 Encounter for therapeutic drug level monitoring: Secondary | ICD-10-CM | POA: Diagnosis not present

## 2020-11-25 DIAGNOSIS — I4821 Permanent atrial fibrillation: Secondary | ICD-10-CM

## 2020-11-25 LAB — POCT INR: INR: 3.7 — AB (ref 2.0–3.0)

## 2020-11-25 NOTE — Patient Instructions (Signed)
Description   Do not take any Warfarin tonight then start taking Warfarin 1 tablet every day except 1/2 tablet on Tuesdays and Saturdays.  Recheck in 3 weeks. Call us if any new medications are added or changed. [phone]

## 2020-11-26 ENCOUNTER — Other Ambulatory Visit: Payer: Self-pay

## 2020-11-26 ENCOUNTER — Encounter: Payer: Self-pay | Admitting: Podiatry

## 2020-11-26 ENCOUNTER — Ambulatory Visit (INDEPENDENT_AMBULATORY_CARE_PROVIDER_SITE_OTHER): Payer: Medicare Other | Admitting: Podiatry

## 2020-11-26 DIAGNOSIS — E1159 Type 2 diabetes mellitus with other circulatory complications: Secondary | ICD-10-CM | POA: Diagnosis not present

## 2020-11-26 DIAGNOSIS — N183 Chronic kidney disease, stage 3 unspecified: Secondary | ICD-10-CM | POA: Diagnosis not present

## 2020-11-26 DIAGNOSIS — M79674 Pain in right toe(s): Secondary | ICD-10-CM

## 2020-11-26 DIAGNOSIS — B351 Tinea unguium: Secondary | ICD-10-CM | POA: Diagnosis not present

## 2020-11-26 DIAGNOSIS — D689 Coagulation defect, unspecified: Secondary | ICD-10-CM | POA: Diagnosis not present

## 2020-11-26 DIAGNOSIS — M79675 Pain in left toe(s): Secondary | ICD-10-CM | POA: Diagnosis not present

## 2020-11-26 NOTE — Progress Notes (Signed)
This patient returns to my office for at risk foot care.  This patient requires this care by a professional since this patient will be at risk due to having diabetes and chronic kidney disease.  Patient is taking coumadin.  This patient is unable to cut nails himself since the patient cannot reach his nails.These nails are painful walking and wearing shoes.  This patient presents for at risk foot care today.  General Appearance  Alert, conversant and in no acute stress.  Vascular  Dorsalis pedis and posterior tibial  pulses are not  palpable  bilaterally.  Capillary return is within normal limits  bilaterally. Cold feet.  Bilaterally. Absent digital hair. Swelling 2-3+  Neurologic  Senn-Weinstein monofilament wire test within normal limits  bilaterally. Muscle power within normal limits bilaterally.  Nails Thick disfigured discolored nails with subungual debris  from hallux to fifth toes bilaterally. No evidence of bacterial infection or drainage bilaterally.  Orthopedic  No limitations of motion  feet .  No crepitus or effusions noted.  No bony pathology or digital deformities noted.  Skin  Thin  skin with no porokeratosis noted bilaterally.  No signs of infections or ulcers noted.     Onychomycosis  Pain in right toes  Pain in left toes  Consent was obtained for treatment procedures.   Mechanical debridement of nails 1-5  bilaterally performed with a nail nipper.  Filed with dremel without incident.    Return office visit   3 months                  Told patient to return for periodic foot care and evaluation due to potential at risk complications.   Gardiner Barefoot DPM

## 2020-11-27 ENCOUNTER — Ambulatory Visit (INDEPENDENT_AMBULATORY_CARE_PROVIDER_SITE_OTHER): Payer: Medicare Other

## 2020-11-27 DIAGNOSIS — I4821 Permanent atrial fibrillation: Secondary | ICD-10-CM

## 2020-12-03 DIAGNOSIS — I25810 Atherosclerosis of coronary artery bypass graft(s) without angina pectoris: Secondary | ICD-10-CM | POA: Diagnosis not present

## 2020-12-03 DIAGNOSIS — Z23 Encounter for immunization: Secondary | ICD-10-CM | POA: Diagnosis not present

## 2020-12-03 DIAGNOSIS — E1122 Type 2 diabetes mellitus with diabetic chronic kidney disease: Secondary | ICD-10-CM | POA: Diagnosis not present

## 2020-12-03 DIAGNOSIS — I050 Rheumatic mitral stenosis: Secondary | ICD-10-CM | POA: Diagnosis not present

## 2020-12-03 DIAGNOSIS — R7989 Other specified abnormal findings of blood chemistry: Secondary | ICD-10-CM | POA: Diagnosis not present

## 2020-12-03 DIAGNOSIS — G5792 Unspecified mononeuropathy of left lower limb: Secondary | ICD-10-CM | POA: Diagnosis not present

## 2020-12-03 DIAGNOSIS — I10 Essential (primary) hypertension: Secondary | ICD-10-CM | POA: Diagnosis not present

## 2020-12-03 DIAGNOSIS — R946 Abnormal results of thyroid function studies: Secondary | ICD-10-CM | POA: Diagnosis not present

## 2020-12-03 DIAGNOSIS — N1832 Chronic kidney disease, stage 3b: Secondary | ICD-10-CM | POA: Diagnosis not present

## 2020-12-03 DIAGNOSIS — I482 Chronic atrial fibrillation, unspecified: Secondary | ICD-10-CM | POA: Diagnosis not present

## 2020-12-03 DIAGNOSIS — I5032 Chronic diastolic (congestive) heart failure: Secondary | ICD-10-CM | POA: Diagnosis not present

## 2020-12-03 DIAGNOSIS — D6869 Other thrombophilia: Secondary | ICD-10-CM | POA: Diagnosis not present

## 2020-12-03 DIAGNOSIS — Z7901 Long term (current) use of anticoagulants: Secondary | ICD-10-CM | POA: Diagnosis not present

## 2020-12-03 LAB — CUP PACEART REMOTE DEVICE CHECK
Battery Impedance: 4918 Ohm
Battery Remaining Longevity: 9 mo
Battery Voltage: 2.68 V
Brady Statistic RV Percent Paced: 99 %
Date Time Interrogation Session: 20221005172218
Implantable Lead Implant Date: 20140807
Implantable Lead Location: 753860
Implantable Lead Model: 5092
Implantable Pulse Generator Implant Date: 20140807
Lead Channel Impedance Value: 0 Ohm
Lead Channel Impedance Value: 550 Ohm
Lead Channel Pacing Threshold Amplitude: 0.75 V
Lead Channel Pacing Threshold Pulse Width: 0.4 ms
Lead Channel Setting Pacing Amplitude: 2.5 V
Lead Channel Setting Pacing Pulse Width: 0.4 ms
Lead Channel Setting Sensing Sensitivity: 2 mV

## 2020-12-03 NOTE — Progress Notes (Signed)
Remote pacemaker transmission.   

## 2020-12-16 ENCOUNTER — Ambulatory Visit (INDEPENDENT_AMBULATORY_CARE_PROVIDER_SITE_OTHER): Payer: Medicare Other

## 2020-12-16 ENCOUNTER — Other Ambulatory Visit: Payer: Self-pay

## 2020-12-16 DIAGNOSIS — Z5181 Encounter for therapeutic drug level monitoring: Secondary | ICD-10-CM | POA: Diagnosis not present

## 2020-12-16 DIAGNOSIS — I4821 Permanent atrial fibrillation: Secondary | ICD-10-CM

## 2020-12-16 LAB — POCT INR: INR: 3.1 — AB (ref 2.0–3.0)

## 2020-12-16 NOTE — Patient Instructions (Signed)
Description   Continue taking Warfarin 1 tablet every day except 1/2 tablet on Tuesdays and Saturdays.  Recheck in 3 weeks. Call us if any new medications are added or changed. [phone]

## 2020-12-28 ENCOUNTER — Ambulatory Visit (INDEPENDENT_AMBULATORY_CARE_PROVIDER_SITE_OTHER): Payer: Medicare Other

## 2020-12-28 DIAGNOSIS — I441 Atrioventricular block, second degree: Secondary | ICD-10-CM

## 2020-12-31 LAB — CUP PACEART REMOTE DEVICE CHECK
Battery Impedance: 5211 Ohm
Battery Remaining Longevity: 8 mo
Battery Voltage: 2.69 V
Brady Statistic RV Percent Paced: 99 %
Date Time Interrogation Session: 20221102181150
Implantable Lead Implant Date: 20140807
Implantable Lead Location: 753860
Implantable Lead Model: 5092
Implantable Pulse Generator Implant Date: 20140807
Lead Channel Impedance Value: 0 Ohm
Lead Channel Impedance Value: 535 Ohm
Lead Channel Pacing Threshold Amplitude: 0.75 V
Lead Channel Pacing Threshold Pulse Width: 0.4 ms
Lead Channel Setting Pacing Amplitude: 2.5 V
Lead Channel Setting Pacing Pulse Width: 0.4 ms
Lead Channel Setting Sensing Sensitivity: 2 mV

## 2021-01-05 NOTE — Progress Notes (Signed)
Remote pacemaker transmission.   

## 2021-01-05 NOTE — Addendum Note (Signed)
Addended by: Cheri Kearns A on: 01/05/2021 09:50 AM   Modules accepted: Level of Service

## 2021-01-06 ENCOUNTER — Other Ambulatory Visit: Payer: Self-pay

## 2021-01-06 ENCOUNTER — Ambulatory Visit (INDEPENDENT_AMBULATORY_CARE_PROVIDER_SITE_OTHER): Payer: Medicare Other

## 2021-01-06 DIAGNOSIS — Z5181 Encounter for therapeutic drug level monitoring: Secondary | ICD-10-CM | POA: Diagnosis not present

## 2021-01-06 DIAGNOSIS — I4821 Permanent atrial fibrillation: Secondary | ICD-10-CM | POA: Diagnosis not present

## 2021-01-06 LAB — POCT INR: INR: 2.6 (ref 2.0–3.0)

## 2021-01-06 NOTE — Patient Instructions (Signed)
Description   Continue taking Warfarin 1 tablet every day except 1/2 tablet on Tuesdays and Saturdays.  Recheck in 4 weeks. Call us if any new medications are added or changed. [phone]

## 2021-01-23 ENCOUNTER — Other Ambulatory Visit: Payer: Self-pay | Admitting: Interventional Cardiology

## 2021-01-25 NOTE — Telephone Encounter (Signed)
Prescription refill request received for warfarin Lov: allred, 10/14/2020 Next INR check: 12/7 Warfarin tablet strength: 2.5mg    Refill sent.

## 2021-01-28 ENCOUNTER — Ambulatory Visit (INDEPENDENT_AMBULATORY_CARE_PROVIDER_SITE_OTHER): Payer: Medicare Other

## 2021-01-28 DIAGNOSIS — I441 Atrioventricular block, second degree: Secondary | ICD-10-CM

## 2021-02-02 LAB — CUP PACEART REMOTE DEVICE CHECK
Battery Impedance: 5140 Ohm
Battery Remaining Longevity: 8 mo
Battery Voltage: 2.69 V
Brady Statistic RV Percent Paced: 99 %
Date Time Interrogation Session: 20221206133549
Implantable Lead Implant Date: 20140807
Implantable Lead Location: 753860
Implantable Lead Model: 5092
Implantable Pulse Generator Implant Date: 20140807
Lead Channel Impedance Value: 0 Ohm
Lead Channel Impedance Value: 518 Ohm
Lead Channel Pacing Threshold Amplitude: 0.75 V
Lead Channel Pacing Threshold Pulse Width: 0.4 ms
Lead Channel Setting Pacing Amplitude: 2.5 V
Lead Channel Setting Pacing Pulse Width: 0.4 ms
Lead Channel Setting Sensing Sensitivity: 2 mV

## 2021-02-09 NOTE — Addendum Note (Signed)
Addended by: Cheri Kearns A on: 02/09/2021 08:25 AM   Modules accepted: Level of Service

## 2021-02-09 NOTE — Progress Notes (Signed)
Remote pacemaker transmission.   

## 2021-02-10 ENCOUNTER — Other Ambulatory Visit: Payer: Self-pay

## 2021-02-10 ENCOUNTER — Ambulatory Visit (INDEPENDENT_AMBULATORY_CARE_PROVIDER_SITE_OTHER): Payer: Medicare Other | Admitting: *Deleted

## 2021-02-10 DIAGNOSIS — Z5181 Encounter for therapeutic drug level monitoring: Secondary | ICD-10-CM | POA: Diagnosis not present

## 2021-02-10 DIAGNOSIS — I4821 Permanent atrial fibrillation: Secondary | ICD-10-CM

## 2021-02-10 LAB — POCT INR: INR: 3.6 — AB (ref 2.0–3.0)

## 2021-02-10 NOTE — Patient Instructions (Signed)
Description   Do not take any Warfarin today then continue taking Warfarin 1 tablet every day except 1/2 tablet on Tuesdays and Saturdays. Recheck in 3 weeks. Call us if any new medications are added or changed. [phone]

## 2021-02-25 ENCOUNTER — Ambulatory Visit (INDEPENDENT_AMBULATORY_CARE_PROVIDER_SITE_OTHER): Payer: Medicare Other | Admitting: Podiatry

## 2021-02-25 ENCOUNTER — Other Ambulatory Visit: Payer: Self-pay

## 2021-02-25 ENCOUNTER — Encounter: Payer: Self-pay | Admitting: Podiatry

## 2021-02-25 DIAGNOSIS — N183 Chronic kidney disease, stage 3 unspecified: Secondary | ICD-10-CM

## 2021-02-25 DIAGNOSIS — M79675 Pain in left toe(s): Secondary | ICD-10-CM

## 2021-02-25 DIAGNOSIS — M79674 Pain in right toe(s): Secondary | ICD-10-CM

## 2021-02-25 DIAGNOSIS — B351 Tinea unguium: Secondary | ICD-10-CM | POA: Diagnosis not present

## 2021-02-25 DIAGNOSIS — D689 Coagulation defect, unspecified: Secondary | ICD-10-CM

## 2021-02-25 DIAGNOSIS — E1159 Type 2 diabetes mellitus with other circulatory complications: Secondary | ICD-10-CM

## 2021-02-25 NOTE — Progress Notes (Signed)
This patient returns to my office for at risk foot care.  This patient requires this care by a professional since this patient will be at risk due to having diabetes and chronic kidney disease.  Patient is taking coumadin.  This patient is unable to cut nails himself since the patient cannot reach his nails.These nails are painful walking and wearing shoes.  This patient presents for at risk foot care today.  General Appearance  Alert, conversant and in no acute stress.  Vascular  Dorsalis pedis and posterior tibial  pulses are not  palpable  bilaterally.  Capillary return is within normal limits  bilaterally. Cold feet.  Bilaterally. Absent digital hair. Swelling 2-3+  Neurologic  Senn-Weinstein monofilament wire test within normal limits  bilaterally. Muscle power within normal limits bilaterally.  Nails Thick disfigured discolored nails with subungual debris  from hallux to fifth toes bilaterally. No evidence of bacterial infection or drainage bilaterally.  Orthopedic  No limitations of motion  feet .  No crepitus or effusions noted.  No bony pathology .  Hammer toes  2-5  B/L.  Skin  Thin  skin with no porokeratosis noted bilaterally.  No signs of infections or ulcers noted.     Onychomycosis  Pain in right toes  Pain in left toes  Consent was obtained for treatment procedures.   Mechanical debridement of nails 1-5  bilaterally performed with a nail nipper.  Filed with dremel without incident.    Return office visit   3 months                  Told patient to return for periodic foot care and evaluation due to potential at risk complications.   Gardiner Barefoot DPM

## 2021-03-03 ENCOUNTER — Other Ambulatory Visit: Payer: Self-pay

## 2021-03-03 ENCOUNTER — Ambulatory Visit (INDEPENDENT_AMBULATORY_CARE_PROVIDER_SITE_OTHER): Payer: Medicare Other

## 2021-03-03 DIAGNOSIS — I5032 Chronic diastolic (congestive) heart failure: Secondary | ICD-10-CM | POA: Diagnosis not present

## 2021-03-03 DIAGNOSIS — Z5181 Encounter for therapeutic drug level monitoring: Secondary | ICD-10-CM

## 2021-03-03 DIAGNOSIS — Z950 Presence of cardiac pacemaker: Secondary | ICD-10-CM | POA: Diagnosis not present

## 2021-03-03 DIAGNOSIS — I25810 Atherosclerosis of coronary artery bypass graft(s) without angina pectoris: Secondary | ICD-10-CM | POA: Diagnosis not present

## 2021-03-03 DIAGNOSIS — M21372 Foot drop, left foot: Secondary | ICD-10-CM | POA: Diagnosis not present

## 2021-03-03 DIAGNOSIS — I482 Chronic atrial fibrillation, unspecified: Secondary | ICD-10-CM | POA: Diagnosis not present

## 2021-03-03 DIAGNOSIS — E1122 Type 2 diabetes mellitus with diabetic chronic kidney disease: Secondary | ICD-10-CM | POA: Diagnosis not present

## 2021-03-03 DIAGNOSIS — I10 Essential (primary) hypertension: Secondary | ICD-10-CM | POA: Diagnosis not present

## 2021-03-03 DIAGNOSIS — I050 Rheumatic mitral stenosis: Secondary | ICD-10-CM | POA: Diagnosis not present

## 2021-03-03 DIAGNOSIS — N1832 Chronic kidney disease, stage 3b: Secondary | ICD-10-CM | POA: Diagnosis not present

## 2021-03-03 DIAGNOSIS — E7800 Pure hypercholesterolemia, unspecified: Secondary | ICD-10-CM | POA: Diagnosis not present

## 2021-03-03 DIAGNOSIS — I4821 Permanent atrial fibrillation: Secondary | ICD-10-CM

## 2021-03-03 DIAGNOSIS — D6869 Other thrombophilia: Secondary | ICD-10-CM | POA: Diagnosis not present

## 2021-03-03 DIAGNOSIS — Z952 Presence of prosthetic heart valve: Secondary | ICD-10-CM | POA: Diagnosis not present

## 2021-03-03 LAB — POCT INR: INR: 3.4 — AB (ref 2.0–3.0)

## 2021-03-03 NOTE — Patient Instructions (Signed)
Description   Only take 0.5 tablet today and then START taking Warfarin 1 tablet every day except 1/2 tablet on Tuesdays, Thursdays and Saturdays. Recheck in 3 weeks. Call us if any new medications are added or changed. [phone]

## 2021-03-24 ENCOUNTER — Other Ambulatory Visit: Payer: Self-pay

## 2021-03-24 ENCOUNTER — Ambulatory Visit (INDEPENDENT_AMBULATORY_CARE_PROVIDER_SITE_OTHER): Payer: Medicare Other

## 2021-03-24 DIAGNOSIS — I4821 Permanent atrial fibrillation: Secondary | ICD-10-CM

## 2021-03-24 DIAGNOSIS — Z5181 Encounter for therapeutic drug level monitoring: Secondary | ICD-10-CM

## 2021-03-24 LAB — POCT INR: INR: 2.5 (ref 2.0–3.0)

## 2021-03-24 NOTE — Patient Instructions (Signed)
Description   Continue on same dosage of Warfarin 1 tablet every day except 1/2 tablet on Tuesdays, Thursdays and Saturdays. Recheck in 4 weeks. Call us if any new medications are added or changed. [phone]

## 2021-04-21 ENCOUNTER — Other Ambulatory Visit: Payer: Self-pay

## 2021-04-21 ENCOUNTER — Ambulatory Visit (INDEPENDENT_AMBULATORY_CARE_PROVIDER_SITE_OTHER): Payer: Medicare Other

## 2021-04-21 DIAGNOSIS — Z5181 Encounter for therapeutic drug level monitoring: Secondary | ICD-10-CM

## 2021-04-21 DIAGNOSIS — I4821 Permanent atrial fibrillation: Secondary | ICD-10-CM | POA: Diagnosis not present

## 2021-04-21 LAB — POCT INR: INR: 2.8 (ref 2.0–3.0)

## 2021-04-21 NOTE — Patient Instructions (Signed)
Description   Continue on same dosage of Warfarin 1 tablet every day except 1/2 tablet on Tuesdays, Thursdays and Saturdays. Recheck in 6 weeks. Call us if any new medications are added or changed. [phone]

## 2021-04-26 ENCOUNTER — Other Ambulatory Visit: Payer: Self-pay | Admitting: Interventional Cardiology

## 2021-05-17 DIAGNOSIS — I10 Essential (primary) hypertension: Secondary | ICD-10-CM | POA: Diagnosis not present

## 2021-05-17 DIAGNOSIS — I482 Chronic atrial fibrillation, unspecified: Secondary | ICD-10-CM | POA: Diagnosis not present

## 2021-05-17 DIAGNOSIS — Z1389 Encounter for screening for other disorder: Secondary | ICD-10-CM | POA: Diagnosis not present

## 2021-05-17 DIAGNOSIS — Z952 Presence of prosthetic heart valve: Secondary | ICD-10-CM | POA: Diagnosis not present

## 2021-05-17 DIAGNOSIS — I25810 Atherosclerosis of coronary artery bypass graft(s) without angina pectoris: Secondary | ICD-10-CM | POA: Diagnosis not present

## 2021-05-17 DIAGNOSIS — I050 Rheumatic mitral stenosis: Secondary | ICD-10-CM | POA: Diagnosis not present

## 2021-05-17 DIAGNOSIS — D6869 Other thrombophilia: Secondary | ICD-10-CM | POA: Diagnosis not present

## 2021-05-17 DIAGNOSIS — I5032 Chronic diastolic (congestive) heart failure: Secondary | ICD-10-CM | POA: Diagnosis not present

## 2021-05-17 DIAGNOSIS — M48061 Spinal stenosis, lumbar region without neurogenic claudication: Secondary | ICD-10-CM | POA: Diagnosis not present

## 2021-05-17 DIAGNOSIS — E1142 Type 2 diabetes mellitus with diabetic polyneuropathy: Secondary | ICD-10-CM | POA: Diagnosis not present

## 2021-05-17 DIAGNOSIS — E0842 Diabetes mellitus due to underlying condition with diabetic polyneuropathy: Secondary | ICD-10-CM | POA: Diagnosis not present

## 2021-05-17 DIAGNOSIS — Z0000 Encounter for general adult medical examination without abnormal findings: Secondary | ICD-10-CM | POA: Diagnosis not present

## 2021-05-17 DIAGNOSIS — E7800 Pure hypercholesterolemia, unspecified: Secondary | ICD-10-CM | POA: Diagnosis not present

## 2021-05-17 DIAGNOSIS — N1832 Chronic kidney disease, stage 3b: Secondary | ICD-10-CM | POA: Diagnosis not present

## 2021-05-17 DIAGNOSIS — E46 Unspecified protein-calorie malnutrition: Secondary | ICD-10-CM | POA: Diagnosis not present

## 2021-05-27 ENCOUNTER — Encounter: Payer: Self-pay | Admitting: Podiatry

## 2021-05-27 ENCOUNTER — Ambulatory Visit (INDEPENDENT_AMBULATORY_CARE_PROVIDER_SITE_OTHER): Payer: Medicare Other | Admitting: Podiatry

## 2021-05-27 DIAGNOSIS — M79674 Pain in right toe(s): Secondary | ICD-10-CM

## 2021-05-27 DIAGNOSIS — M79675 Pain in left toe(s): Secondary | ICD-10-CM

## 2021-05-27 DIAGNOSIS — D689 Coagulation defect, unspecified: Secondary | ICD-10-CM

## 2021-05-27 DIAGNOSIS — E1159 Type 2 diabetes mellitus with other circulatory complications: Secondary | ICD-10-CM

## 2021-05-27 DIAGNOSIS — B351 Tinea unguium: Secondary | ICD-10-CM

## 2021-05-27 DIAGNOSIS — N183 Chronic kidney disease, stage 3 unspecified: Secondary | ICD-10-CM

## 2021-05-27 NOTE — Progress Notes (Addendum)
This patient returns to my office for at risk foot care.  This patient requires this care by a professional since this patient will be at risk due to having diabetes and chronic kidney disease.  Patient is taking coumadin.  This patient is unable to cut nails himself since the patient cannot reach his nails.These nails are painful walking and wearing shoes.  This patient presents for at risk foot care today.  General Appearance  Alert, conversant and in no acute stress.  Vascular  Dorsalis pedis and posterior tibial  pulses are not  palpable  bilaterally.  Capillary return is within normal limits  bilaterally. Cold feet.  Bilaterally. Absent digital hair. Swelling 2-3+  Neurologic  Senn-Weinstein monofilament wire test within normal limits  bilaterally. Muscle power within normal limits bilaterally.  Nails Thick disfigured discolored nails with subungual debris  from hallux to fifth toes bilaterally. No evidence of bacterial infection or drainage bilaterally. Hematoma subungually left hallux.  Orthopedic  No limitations of motion  feet .  No crepitus or effusions noted.  No bony pathology .  Hammer toes  2-5  B/L.  Skin  Thin  skin with no porokeratosis noted bilaterally.  No signs of infections or ulcers noted.     Onychomycosis  Pain in right toes  Pain in left toes  Consent was obtained for treatment procedures.   Mechanical debridement of nails 1-5  bilaterally performed with a nail nipper.  Filed with dremel without incident. Neosporin/DSD left hallux.   Return office visit   3 months                  Told patient to return for periodic foot care and evaluation due to potential at risk complications.   Braydn Carneiro DPM  

## 2021-06-02 ENCOUNTER — Ambulatory Visit (INDEPENDENT_AMBULATORY_CARE_PROVIDER_SITE_OTHER): Payer: Medicare Other | Admitting: *Deleted

## 2021-06-02 DIAGNOSIS — I4821 Permanent atrial fibrillation: Secondary | ICD-10-CM

## 2021-06-02 DIAGNOSIS — Z5181 Encounter for therapeutic drug level monitoring: Secondary | ICD-10-CM | POA: Diagnosis not present

## 2021-06-02 LAB — POCT INR: INR: 1.9 — AB (ref 2.0–3.0)

## 2021-06-02 NOTE — Patient Instructions (Signed)
Description   ?Today take 1.5 tablets then continue on same dosage of Warfarin 1 tablet every day except 1/2 tablet on Tuesdays, Thursdays and Saturdays. Recheck in 4 weeks. Call us if any new medications are added or changed. [phone] ? ?  ?  ?

## 2021-06-14 ENCOUNTER — Other Ambulatory Visit: Payer: Self-pay | Admitting: Interventional Cardiology

## 2021-06-30 ENCOUNTER — Ambulatory Visit (INDEPENDENT_AMBULATORY_CARE_PROVIDER_SITE_OTHER): Payer: Medicare Other

## 2021-06-30 DIAGNOSIS — I4821 Permanent atrial fibrillation: Secondary | ICD-10-CM | POA: Diagnosis not present

## 2021-06-30 DIAGNOSIS — Z5181 Encounter for therapeutic drug level monitoring: Secondary | ICD-10-CM | POA: Diagnosis not present

## 2021-06-30 LAB — POCT INR: INR: 1.9 — AB (ref 2.0–3.0)

## 2021-06-30 NOTE — Patient Instructions (Addendum)
Description   ?Take 1.5 tablets today and then START taking Warfarin 1 tablet every day except 1/2 tablet on Tuesdays and Saturdays.  ?Recheck in 2 weeks.  ?Call us if any new medications are added or changed. [phone] ? ?  ?   ?

## 2021-07-14 ENCOUNTER — Ambulatory Visit (INDEPENDENT_AMBULATORY_CARE_PROVIDER_SITE_OTHER): Payer: Medicare Other | Admitting: *Deleted

## 2021-07-14 DIAGNOSIS — I4821 Permanent atrial fibrillation: Secondary | ICD-10-CM

## 2021-07-14 DIAGNOSIS — Z5181 Encounter for therapeutic drug level monitoring: Secondary | ICD-10-CM | POA: Diagnosis not present

## 2021-07-14 LAB — POCT INR: INR: 3.9 — AB (ref 2.0–3.0)

## 2021-07-14 NOTE — Patient Instructions (Signed)
Description   ?Do not take any Warfarin today then continue taking Warfarin 1 tablet every day except 1/2 tablet on Tuesdays and Saturdays. Recheck in 2 weeks. Call us if any new medications are added or changed. [phone] ? ?  ?  ?

## 2021-07-19 ENCOUNTER — Encounter: Payer: Medicare Other | Admitting: Internal Medicine

## 2021-07-21 ENCOUNTER — Encounter: Payer: Medicare Other | Admitting: Internal Medicine

## 2021-07-28 ENCOUNTER — Ambulatory Visit (INDEPENDENT_AMBULATORY_CARE_PROVIDER_SITE_OTHER): Payer: Medicare Other

## 2021-07-28 DIAGNOSIS — I4821 Permanent atrial fibrillation: Secondary | ICD-10-CM | POA: Diagnosis not present

## 2021-07-28 DIAGNOSIS — Z5181 Encounter for therapeutic drug level monitoring: Secondary | ICD-10-CM | POA: Diagnosis not present

## 2021-07-28 LAB — POCT INR: INR: 2.9 (ref 2.0–3.0)

## 2021-07-28 NOTE — Patient Instructions (Signed)
Description   Continue taking Warfarin 1 tablet every day except 1/2 tablet on Tuesdays and Saturdays.  Recheck in 3 weeks. Call us if any new medications are added or changed. [phone]

## 2021-08-18 ENCOUNTER — Ambulatory Visit (INDEPENDENT_AMBULATORY_CARE_PROVIDER_SITE_OTHER): Payer: Medicare Other

## 2021-08-18 DIAGNOSIS — Z5181 Encounter for therapeutic drug level monitoring: Secondary | ICD-10-CM | POA: Diagnosis not present

## 2021-08-18 DIAGNOSIS — I4821 Permanent atrial fibrillation: Secondary | ICD-10-CM

## 2021-08-18 LAB — POCT INR: INR: 2.4 (ref 2.0–3.0)

## 2021-08-18 NOTE — Patient Instructions (Signed)
Description   Continue taking Warfarin 1 tablet every day except 1/2 tablet on Tuesdays and Saturdays.  Recheck in 5 weeks. Call us if any new medications are added or changed. [phone]

## 2021-08-23 ENCOUNTER — Ambulatory Visit (INDEPENDENT_AMBULATORY_CARE_PROVIDER_SITE_OTHER): Payer: Medicare Other | Admitting: Internal Medicine

## 2021-08-23 ENCOUNTER — Encounter (HOSPITAL_BASED_OUTPATIENT_CLINIC_OR_DEPARTMENT_OTHER): Payer: Self-pay | Admitting: Internal Medicine

## 2021-08-23 VITALS — BP 130/70 | HR 70 | Ht 70.0 in | Wt 169.2 lb

## 2021-08-23 DIAGNOSIS — Z952 Presence of prosthetic heart valve: Secondary | ICD-10-CM

## 2021-08-23 DIAGNOSIS — Z951 Presence of aortocoronary bypass graft: Secondary | ICD-10-CM

## 2021-08-23 DIAGNOSIS — I10 Essential (primary) hypertension: Secondary | ICD-10-CM | POA: Diagnosis not present

## 2021-08-23 DIAGNOSIS — I2510 Atherosclerotic heart disease of native coronary artery without angina pectoris: Secondary | ICD-10-CM

## 2021-08-23 DIAGNOSIS — I4821 Permanent atrial fibrillation: Secondary | ICD-10-CM

## 2021-08-23 DIAGNOSIS — I441 Atrioventricular block, second degree: Secondary | ICD-10-CM

## 2021-08-24 ENCOUNTER — Telehealth: Payer: Self-pay

## 2021-08-24 NOTE — Telephone Encounter (Signed)
I called the patient so I can get him a new handheld ordered. No answer/no voicemail.   The patient needs monthly battery checks. He has 3 months on his battery. Dr. Johney Frame wants him to follow up with the device clinic in 2 months.

## 2021-08-26 ENCOUNTER — Ambulatory Visit (INDEPENDENT_AMBULATORY_CARE_PROVIDER_SITE_OTHER): Payer: Medicare Other | Admitting: Podiatry

## 2021-08-26 ENCOUNTER — Encounter: Payer: Self-pay | Admitting: Podiatry

## 2021-08-26 DIAGNOSIS — M79674 Pain in right toe(s): Secondary | ICD-10-CM

## 2021-08-26 DIAGNOSIS — B351 Tinea unguium: Secondary | ICD-10-CM

## 2021-08-26 DIAGNOSIS — N183 Chronic kidney disease, stage 3 unspecified: Secondary | ICD-10-CM | POA: Diagnosis not present

## 2021-08-26 DIAGNOSIS — M79675 Pain in left toe(s): Secondary | ICD-10-CM | POA: Diagnosis not present

## 2021-08-26 DIAGNOSIS — D689 Coagulation defect, unspecified: Secondary | ICD-10-CM

## 2021-08-26 DIAGNOSIS — E1159 Type 2 diabetes mellitus with other circulatory complications: Secondary | ICD-10-CM | POA: Diagnosis not present

## 2021-08-26 NOTE — Progress Notes (Signed)
This patient returns to my office for at risk foot care.  This patient requires this care by a professional since this patient will be at risk due to having diabetes and chronic kidney disease.  Patient is taking coumadin.  This patient is unable to cut nails himself since the patient cannot reach his nails.These nails are painful walking and wearing shoes.  This patient presents for at risk foot care today.  General Appearance  Alert, conversant and in no acute stress.  Vascular  Dorsalis pedis and posterior tibial  pulses are not  palpable  bilaterally.  Capillary return is within normal limits  bilaterally. Cold feet.  Bilaterally. Absent digital hair. Swelling 2-3+  Neurologic  Senn-Weinstein monofilament wire test within normal limits  bilaterally. Muscle power within normal limits bilaterally.  Nails Thick disfigured discolored nails with subungual debris  from hallux to fifth toes bilaterally. No evidence of bacterial infection or drainage bilaterally. Hematoma subungually left hallux.  Orthopedic  No limitations of motion  feet .  No crepitus or effusions noted.  No bony pathology .  Hammer toes  2-5  B/L.  Skin  Thin  skin with no porokeratosis noted bilaterally.  No signs of infections or ulcers noted.     Onychomycosis  Pain in right toes  Pain in left toes  Consent was obtained for treatment procedures.   Mechanical debridement of nails 1-5  bilaterally performed with a nail nipper.  Filed with dremel without incident. Neosporin/DSD left hallux.   Return office visit   3 months                  Told patient to return for periodic foot care and evaluation due to potential at risk complications.   Gardiner Barefoot DPM

## 2021-08-26 NOTE — Telephone Encounter (Signed)
I spoke with the patient daughter [name] and ordered the patient a new handheld.

## 2021-08-26 NOTE — Telephone Encounter (Signed)
LMOVM for patient to return device clinic call. 

## 2021-09-03 NOTE — Telephone Encounter (Signed)
LMOVM for patient to give [place] a call back to let us know if her received his new handheld.

## 2021-09-06 DIAGNOSIS — E46 Unspecified protein-calorie malnutrition: Secondary | ICD-10-CM | POA: Diagnosis not present

## 2021-09-06 DIAGNOSIS — I25810 Atherosclerosis of coronary artery bypass graft(s) without angina pectoris: Secondary | ICD-10-CM | POA: Diagnosis not present

## 2021-09-06 DIAGNOSIS — E7800 Pure hypercholesterolemia, unspecified: Secondary | ICD-10-CM | POA: Diagnosis not present

## 2021-09-06 DIAGNOSIS — N1832 Chronic kidney disease, stage 3b: Secondary | ICD-10-CM | POA: Diagnosis not present

## 2021-09-06 DIAGNOSIS — I10 Essential (primary) hypertension: Secondary | ICD-10-CM | POA: Diagnosis not present

## 2021-09-06 DIAGNOSIS — M48061 Spinal stenosis, lumbar region without neurogenic claudication: Secondary | ICD-10-CM | POA: Diagnosis not present

## 2021-09-06 DIAGNOSIS — E1122 Type 2 diabetes mellitus with diabetic chronic kidney disease: Secondary | ICD-10-CM | POA: Diagnosis not present

## 2021-09-06 DIAGNOSIS — I482 Chronic atrial fibrillation, unspecified: Secondary | ICD-10-CM | POA: Diagnosis not present

## 2021-09-06 DIAGNOSIS — I5032 Chronic diastolic (congestive) heart failure: Secondary | ICD-10-CM | POA: Diagnosis not present

## 2021-09-06 DIAGNOSIS — D6869 Other thrombophilia: Secondary | ICD-10-CM | POA: Diagnosis not present

## 2021-09-06 DIAGNOSIS — Z952 Presence of prosthetic heart valve: Secondary | ICD-10-CM | POA: Diagnosis not present

## 2021-09-06 DIAGNOSIS — I050 Rheumatic mitral stenosis: Secondary | ICD-10-CM | POA: Diagnosis not present

## 2021-09-07 ENCOUNTER — Ambulatory Visit (INDEPENDENT_AMBULATORY_CARE_PROVIDER_SITE_OTHER): Payer: Medicare Other

## 2021-09-07 DIAGNOSIS — I441 Atrioventricular block, second degree: Secondary | ICD-10-CM | POA: Diagnosis not present

## 2021-09-07 LAB — CUP PACEART REMOTE DEVICE CHECK
Battery Impedance: 6058 Ohm
Battery Remaining Longevity: 4 mo
Battery Voltage: 2.65 V
Brady Statistic RV Percent Paced: 99 %
Date Time Interrogation Session: 20230711110010
Implantable Lead Implant Date: 20140807
Implantable Lead Location: 753860
Implantable Lead Model: 5092
Implantable Pulse Generator Implant Date: 20140807
Lead Channel Impedance Value: 0 Ohm
Lead Channel Impedance Value: 520 Ohm
Lead Channel Pacing Threshold Amplitude: 0.75 V
Lead Channel Pacing Threshold Pulse Width: 0.4 ms
Lead Channel Setting Pacing Amplitude: 2.5 V
Lead Channel Setting Pacing Pulse Width: 0.4 ms
Lead Channel Setting Sensing Sensitivity: 2 mV

## 2021-09-07 NOTE — Telephone Encounter (Signed)
Letter sent 09/07/2021

## 2021-09-13 ENCOUNTER — Telehealth: Payer: Self-pay | Admitting: Interventional Cardiology

## 2021-09-13 NOTE — Telephone Encounter (Signed)
This dose was on med list when patient saw Dr Rayann Heman on 08/23/21.  I spoke with [facility] and confirmed this is the only dose they have been filling for patient.  Refill sent to pharmacy.  Patient due for follow up with Dr Irish Lack.  I placed call to patient to schedule appointment.  Left message to call office

## 2021-09-22 ENCOUNTER — Ambulatory Visit (INDEPENDENT_AMBULATORY_CARE_PROVIDER_SITE_OTHER): Payer: Medicare Other | Admitting: *Deleted

## 2021-09-22 DIAGNOSIS — I4821 Permanent atrial fibrillation: Secondary | ICD-10-CM

## 2021-09-22 DIAGNOSIS — Z5181 Encounter for therapeutic drug level monitoring: Secondary | ICD-10-CM

## 2021-09-22 LAB — POCT INR: INR: 2.3 (ref 2.0–3.0)

## 2021-09-22 NOTE — Patient Instructions (Signed)
Description   Continue taking Warfarin 1 tablet every day except 1/2 tablet on Tuesdays and Saturdays.  Recheck in 6 weeks. Call us if any new medications are added or changed. [phone]

## 2021-09-24 NOTE — Telephone Encounter (Signed)
Patient has been scheduled to see Dr Irish Lack on 11/12/21

## 2021-09-29 NOTE — Progress Notes (Signed)
Remote pacemaker transmission.   

## 2021-10-18 ENCOUNTER — Emergency Department (HOSPITAL_COMMUNITY): Payer: Medicare Other

## 2021-10-18 ENCOUNTER — Encounter (HOSPITAL_COMMUNITY): Payer: Self-pay | Admitting: Emergency Medicine

## 2021-10-18 ENCOUNTER — Other Ambulatory Visit: Payer: Self-pay

## 2021-10-18 ENCOUNTER — Emergency Department (HOSPITAL_COMMUNITY)
Admission: EM | Admit: 2021-10-18 | Discharge: 2021-10-19 | Disposition: A | Discharge status: Home / Self Care | Payer: Medicare Other | Attending: Emergency Medicine | Admitting: Emergency Medicine

## 2021-10-18 DIAGNOSIS — S50312A Abrasion of left elbow, initial encounter: Secondary | ICD-10-CM | POA: Diagnosis not present

## 2021-10-18 DIAGNOSIS — W010XXA Fall on same level from slipping, tripping and stumbling without subsequent striking against object, initial encounter: Secondary | ICD-10-CM | POA: Diagnosis not present

## 2021-10-18 DIAGNOSIS — S40211A Abrasion of right shoulder, initial encounter: Secondary | ICD-10-CM | POA: Insufficient documentation

## 2021-10-18 DIAGNOSIS — S51011A Laceration without foreign body of right elbow, initial encounter: Secondary | ICD-10-CM | POA: Diagnosis not present

## 2021-10-18 DIAGNOSIS — S50311A Abrasion of right elbow, initial encounter: Secondary | ICD-10-CM | POA: Insufficient documentation

## 2021-10-18 DIAGNOSIS — Z23 Encounter for immunization: Secondary | ICD-10-CM | POA: Diagnosis not present

## 2021-10-18 DIAGNOSIS — Y92009 Unspecified place in unspecified non-institutional (private) residence as the place of occurrence of the external cause: Secondary | ICD-10-CM | POA: Insufficient documentation

## 2021-10-18 DIAGNOSIS — Z7901 Long term (current) use of anticoagulants: Secondary | ICD-10-CM | POA: Diagnosis not present

## 2021-10-18 DIAGNOSIS — S59902A Unspecified injury of left elbow, initial encounter: Secondary | ICD-10-CM | POA: Diagnosis present

## 2021-10-18 NOTE — ED Provider Triage Note (Signed)
Emergency Medicine Provider Triage Evaluation Note  [name] , a 88 y.o. male  was evaluated in triage.  Pt complains of fall, patient takes warfarin.  Patient was walking in the driveway, and slid across the front windshield of the car, and fell onto elbows, right greater than left.  He reports that he does not think that anything is broken, he did not hit his head, did not lose consciousness.  Physical exam reveals skin tear of right elbow, small excoriation of left elbow.  He denies any other pain, he has been able to walk since the incident, has not had any pain in hips or legs.  He is unsure when his last tetanus was.  He reports last INR was similar in the 1.9-2.5 range.  Review of Systems  Positive: Fall on thinners Negative: Loss of consciousness, head injury  Physical Exam  BP (!) 102/53 (BP Location: Right Arm)   Pulse 60   Temp 98 F (36.7 C) (Oral)   Resp 16   SpO2 100%  Gen:   Awake, no distress   Resp:  Normal effort  MSK:   Moves extremities without difficulty  Other:  Laceration versus skin tear noted right elbow, no tenderness palpation of shoulders, hips, midline spine, or head  Medical Decision Making  Medically screening exam initiated at 9:05 PM.  Appropriate orders placed.  [name] was informed that the remainder of the evaluation will be completed by another provider, this initial triage assessment does not replace that evaluation, and the importance of remaining in the ED until their evaluation is complete.  Work-up initiated   Anselmo Pickler, [place] 10/18/21 2107

## 2021-10-18 NOTE — ED Triage Notes (Signed)
Patient lost his balance and fell at home this evening , denies LOC , presents with right elbow skin tear /left elbow skin abrasion , takes Coumadin /no head injury.

## 2021-10-19 DIAGNOSIS — S50312A Abrasion of left elbow, initial encounter: Secondary | ICD-10-CM | POA: Diagnosis not present

## 2021-10-19 MED ORDER — TETANUS-DIPHTH-ACELL PERTUSSIS 5-2.5-18.5 LF-MCG/0.5 IM SUSY
0.5000 mL | PREFILLED_SYRINGE | Freq: Once | INTRAMUSCULAR | Status: AC
  Administered 2021-10-19: 0.5 mL via INTRAMUSCULAR
  Filled 2021-10-19: qty 0.5

## 2021-10-19 NOTE — ED Notes (Signed)
Patient verbalizes understanding of discharge instructions. Opportunity for questioning and answers were provided. Armband removed by staff, pt discharged from ED via wheelchair.  

## 2021-10-19 NOTE — ED Notes (Signed)
Pt wound cleansed, xeroform placed, and arm wrapped

## 2021-10-19 NOTE — ED Provider Notes (Signed)
[facility] EMERGENCY DEPARTMENT Provider Note   CSN: [id number] Arrival date & time: 10/18/21  2015     History  Chief Complaint  Patient presents with   Fall / Elbow Injury    [name] is a 88 y.o. male.  The history is provided by the patient and a relative.   Patient presents after accidental fall.  Patient reports he stepped into gravel and lost a step and fell.  Family witnessed the entire event.  No head injury, no LOC.  No headache or neck pain.  He reports he was injured in his right and left elbow.  He also has a small abrasion around his right shoulder. No other acute complaints.  Patient is currently on Coumadin    Home Medications Prior to Admission medications   Medication Sig Start Date End Date Taking? Authorizing Provider  acetaminophen (TYLENOL) 325 MG tablet Take 325-650 mg by mouth every 6 (six) hours as needed for mild pain or headache.    [provider]  Coenzyme Q10 (CO Q-10) 100 MG CAPS 1 capsule with a meal    [provider]  Elastic Bandages & Supports (V-2 HIGH COMPRESSION HOSE) MISC daily. 03/21/13   [provider]  famotidine (PEPCID) 20 MG tablet 1 tablet    [provider]  glimepiride (AMARYL) 1 MG tablet Take 1 mg by mouth 2 (two) times daily.     [provider]  ketoconazole (NIZORAL) 2 % cream 1 application to affected area 04/21/10   [provider]  nitroGLYCERIN (NITROSTAT) 0.4 MG SL tablet Place 1 tablet (0.4 mg total) under the tongue every 5 (five) minutes x 3 doses as needed for chest pain. 10/05/12   Jettie Booze, MD  tamsulosin (FLOMAX) 0.4 MG CAPS capsule Take 1 capsule (0.4 mg total) by mouth daily. 12/30/14   Florencia Reasons, MD  torsemide (DEMADEX) 20 MG tablet Take 2 tablets (40 mg total) by mouth 2 (two) times daily. 09/13/21   Jettie Booze, MD  warfarin (COUMADIN) 2.5 MG tablet TAKE 1/2 A TABLET TO 1 TABLET BY MOUTH DAILY AS DIRECTED BY THE  COUMADIN CLINIC. 04/26/21   Jettie Booze, MD      Allergies    Demerol [meperidine], Meperidine hcl, Nasal spray, and Simvastatin    Review of Systems   Review of Systems  Skin:  Positive for wound.  Neurological:  Negative for headaches.    Physical Exam Updated Vital Signs BP 129/82   Pulse 72   Temp 98.5 F (36.9 C)   Resp 16   SpO2 98%  Physical Exam CONSTITUTIONAL: Elderly, no acute distress HEAD: Normocephalic/atraumatic, no visible trauma EYES: EOMI ENMT: Mucous membranes moist Spine - no CTL tenderness LUNGS:  no apparent distress Chest-no bruising or crepitus ABDOMEN: soft, nontender NEURO: Pt is awake/alert/appropriate, moves all extremitiesx4.  No facial droop.  GCS 15 EXTREMITIES: pulses normal/equal, full ROM Abrasion noted to the right proximal forearm Small abrasion to the left elbow Mild tenderness noted to elbow but he has full range of motion of each elbow All other extremities/joints palpated/ranged and nontender SKIN: warm, see photo Small abrasion noted to the right scapula PSYCH: no abnormalities of mood noted, alert and oriented to situation     ED Results / Procedures / Treatments   Labs (all labs ordered are listed, but only abnormal results are displayed) Labs Reviewed - No data to display  EKG None  Radiology DG Elbow Complete Right  Result Date: 10/18/2021 CLINICAL DATA:  Lost balance leading to fall. Right elbow skin tear. EXAM: RIGHT ELBOW - COMPLETE 3+ VIEW COMPARISON:  None Available. FINDINGS: There is no evidence of fracture, dislocation, or joint effusion. Chronic appearing calcification is seen involving the lateral joint line. No soft tissue gas or radiopaque foreign body. IMPRESSION: 1. No acute fracture or dislocation. 2. Chronic appearing calcification involving the lateral joint line, possible chondrocalcinosis. Electronically Signed   By: Keith Rake M.D.   On: 10/18/2021 21:53   DG Elbow Complete  Left  Result Date: 10/18/2021 CLINICAL DATA:  Lost balance leading to fall.  Left elbow abrasion. EXAM: LEFT ELBOW - COMPLETE 3+ VIEW COMPARISON:  None Available. FINDINGS: There is no evidence of fracture, dislocation, or joint effusion. There is no evidence of arthropathy or other focal bone abnormality. There are vascular calcifications. No radiopaque foreign body. Soft tissues are unremarkable. IMPRESSION: No fracture or dislocation of the left elbow. Electronically Signed   By: Keith Rake M.D.   On: 10/18/2021 21:52    Procedures Procedures    Medications Ordered in ED Medications  Tdap (BOOSTRIX) injection 0.5 mL (has no administration in time range)    ED Course/ Medical Decision Making/ A&P                           Medical Decision Making Risk Prescription drug management.   Overall well-appearing.  He is on Coumadin, but has no signs of any head trauma.  He has no signs any trauma to his torso He has been in the emergency room for several hours without any deterioration.  Patient overall feels at his baseline. We will have wound cleaned and rewrapped, and will be discharged home.  Not amenable to repair at this time        Final Clinical Impression(s) / ED Diagnoses Final diagnoses:  Abrasion of left elbow, initial encounter  Abrasion of right elbow, initial encounter    Rx / DC Orders ED Discharge Orders     None         Ripley Fraise, MD 10/19/21 0502

## 2021-10-19 NOTE — Progress Notes (Signed)
Remote reviewed. Battery status noted.  Leads function stable

## 2021-11-03 ENCOUNTER — Ambulatory Visit: Payer: Medicare Other | Attending: Cardiology

## 2021-11-03 DIAGNOSIS — R001 Bradycardia, unspecified: Secondary | ICD-10-CM | POA: Diagnosis not present

## 2021-11-03 LAB — CUP PACEART INCLINIC DEVICE CHECK
Battery Impedance: 6734 Ohm
Battery Remaining Longevity: 2 mo
Battery Voltage: 2.63 V
Brady Statistic RV Percent Paced: 99 %
Date Time Interrogation Session: 20230906085100
Implantable Lead Implant Date: 20140807
Implantable Lead Location: 753860
Implantable Lead Model: 5092
Implantable Pulse Generator Implant Date: 20140807
Lead Channel Impedance Value: 0 Ohm
Lead Channel Impedance Value: 532 Ohm
Lead Channel Pacing Threshold Amplitude: 0.75 V
Lead Channel Pacing Threshold Amplitude: 0.75 V
Lead Channel Pacing Threshold Pulse Width: 0.4 ms
Lead Channel Pacing Threshold Pulse Width: 0.4 ms
Lead Channel Sensing Intrinsic Amplitude: 2.8 mV
Lead Channel Setting Pacing Amplitude: 2.5 V
Lead Channel Setting Pacing Pulse Width: 0.4 ms
Lead Channel Setting Sensing Sensitivity: 2 mV

## 2021-11-03 NOTE — Patient Instructions (Signed)
Follow up scheduled with Dr. Lovena Le in 2 months to discuss gen change.

## 2021-11-03 NOTE — Progress Notes (Signed)
Pacemaker check in clinic. Normal device function. Thresholds, sensing, impedances consistent with previous measurements. Device programmed to maximize longevity. No mode switch or high ventricular rates noted. Device programmed at appropriate safety margins. Histogram distribution appropriate for patient activity level. Device programmed to optimize intrinsic conduction. Estimated longevity 2 months. Patient enrolled in monthly remote follow-up. Follow up scheduled with Dr. Lovena Le in 2 months to discuss gen change.

## 2021-11-04 ENCOUNTER — Ambulatory Visit: Payer: Medicare Other | Attending: Interventional Cardiology

## 2021-11-04 DIAGNOSIS — I4821 Permanent atrial fibrillation: Secondary | ICD-10-CM | POA: Insufficient documentation

## 2021-11-04 DIAGNOSIS — Z5181 Encounter for therapeutic drug level monitoring: Secondary | ICD-10-CM | POA: Insufficient documentation

## 2021-11-04 LAB — POCT INR: INR: 2.5 (ref 2.0–3.0)

## 2021-11-04 NOTE — Patient Instructions (Signed)
Continue taking Warfarin 1 tablet every day except 1/2 tablet on Tuesdays and Saturdays.  Recheck in 6 weeks. Call us if any new medications are added or changed. [phone]

## 2021-11-12 ENCOUNTER — Ambulatory Visit: Payer: Medicare Other | Attending: Interventional Cardiology | Admitting: Interventional Cardiology

## 2021-11-12 ENCOUNTER — Encounter: Payer: Self-pay | Admitting: Interventional Cardiology

## 2021-11-12 VITALS — BP 118/62 | HR 43 | Ht 70.0 in | Wt 165.0 lb

## 2021-11-12 DIAGNOSIS — Z789 Other specified health status: Secondary | ICD-10-CM | POA: Diagnosis not present

## 2021-11-12 DIAGNOSIS — I2510 Atherosclerotic heart disease of native coronary artery without angina pectoris: Secondary | ICD-10-CM

## 2021-11-12 DIAGNOSIS — N183 Chronic kidney disease, stage 3 unspecified: Secondary | ICD-10-CM | POA: Diagnosis not present

## 2021-11-12 DIAGNOSIS — Z952 Presence of prosthetic heart valve: Secondary | ICD-10-CM | POA: Insufficient documentation

## 2021-11-12 DIAGNOSIS — Z950 Presence of cardiac pacemaker: Secondary | ICD-10-CM | POA: Diagnosis not present

## 2021-11-12 DIAGNOSIS — Z951 Presence of aortocoronary bypass graft: Secondary | ICD-10-CM

## 2021-11-12 DIAGNOSIS — I4821 Permanent atrial fibrillation: Secondary | ICD-10-CM | POA: Diagnosis not present

## 2021-11-12 DIAGNOSIS — R001 Bradycardia, unspecified: Secondary | ICD-10-CM

## 2021-11-12 MED ORDER — AMOXICILLIN 500 MG PO TABS: ORAL_TABLET | ORAL | 3 refills | Status: DC

## 2021-11-12 NOTE — Patient Instructions (Signed)
Medication Instructions:  Your physician recommends that you continue on your current medications as directed. Please refer to the Current Medication list given to you today.  *If you need a refill on your cardiac medications before your next appointment, please call your pharmacy*   Lab Work: none If you have labs (blood work) drawn today and your tests are completely normal, you will receive your results only by: [place] (if you have MyChart) OR A paper copy in the mail If you have any lab test that is abnormal or we need to change your treatment, we will call you to review the results.   Testing/Procedures: none   Follow-Up: At [facility], you and your health needs are our priority.  As part of our continuing mission to provide you with exceptional heart care, we have created designated Provider Care Teams.  These Care Teams include your primary Cardiologist (physician) and Advanced Practice Providers (APPs -  Physician Assistants and Nurse Practitioners) who all work together to provide you with the care you need, when you need it.  We recommend signing up for the patient portal called "MyChart".  Sign up information is provided on this After Visit Summary.  MyChart is used to connect with patients for Virtual Visits (Telemedicine).  Patients are able to view lab/test results, encounter notes, upcoming appointments, etc.  Non-urgent messages can be sent to your provider as well.   To learn more about what you can do with MyChart, go to [email or web address].    Your next appointment:   12 month(s)  The format for your next appointment:   In Person  Dr Irish Lack    Other Instructions Your physician discussed the importance of taking an antibiotic prior to any dental, gastrointestinal, genitourinary procedures to prevent damage to the heart valves from infection. You were given a prescription for an antibiotic based on current SBE prophylaxis  guidelines.    Important Information About Sugar

## 2021-11-12 NOTE — Progress Notes (Signed)
Cardiology Office Note   Date:  11/12/2021   ID:  [name], DOB Aug 22, 1925, MRN [id number]  PCP:  Wenda Low, MD    No chief complaint on file.  CAD/AFib  Wt Readings from Last 3 Encounters:  11/12/21 165 lb (74.8 kg)  08/23/21 169 lb 3.2 oz (76.7 kg)  02/10/21 163 lb 12.8 oz (74.3 kg)       History of Present Illness: [name] is a 88 y.o. male   with history of CAD status post CABG 1 in aVR in 2006. He has chronic atrial fibrillation CHADSVASC=5 on Coumadin and bradycardia treated with pacemaker in 2014, hypertension, DM type II.   Patient was hospitalized 08/2016 with sepsis question secondary to cellulitis. Also had acute on chronic CK D stage III creatinine on admission 2.01 was 1.48 and had slight pulmonary edema on chest x-ray. ACE inhibitor was held in the hospital   12/12/16 with significant CHF with weeping of his upper and lower extremities. 2-D echo 12/07/16 LVEF 60-65% with high ventricular filling pressures and moderate mitral stenosis and mild MR and normally functioning aortic valve. BNP was 1801. Creatinine 1.38. I stopped his Lasix and start torsemide 40 mg twice a day for 3 days and 40 mg daily. Weight was 220 pounds that date. I saw back 12/19/16 he was feeling a little better. He had lost 10 pounds in the first 3 days but then the swelling started to come back. Increased his Demadex to 40 mg twice a day. BNP that day was 1485 and creatinine was up to 1.62.     Swelling improved with diuretics.  Cr stable in November 2018.    He again declined statin in 2019. He has tolerated Coumadin.   He had COVID in 4/21.  He was treated with antibody treatment: " The patient agreed to proceed with receiving the bamlanivimab/etesevimab infusion"   In 5/21, he had some sort of tick fever.  Careful to try to avoid falling. He had 1 fall 1 month ago. Got feet tangled when trying to get in to the car.  No other bleeding with warfarin.   Denies : Chest  pain. Dizziness. Leg edema. Nitroglycerin use. Orthopnea. Palpitations. Paroxysmal nocturnal dyspnea. Shortness of breath. Syncope.   Uses walker all of the time.   Wife also had a fall in 2023. Broken vertebrae.  She did not come to the visit today.     Past Medical History:  Diagnosis Date   Aortic stenosis    BPH (benign prostatic hypertrophy)    Coronary artery disease    Dermatophytosis of nail    Diabetes mellitus without complication (HCC)    type 2   Diverticulosis    DVT (deep venous thrombosis) (HCC)    Gastroesophageal reflux disease    GERD (gastroesophageal reflux disease)    Gout    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    Kidney stones    PAH (pulmonary artery hypertension) ([place])    PAS 63 on ECHO 09/2012   Permanent atrial fibrillation (HCC)    PVD (peripheral vascular disease) (HCC)    Shortness of breath    Symptomatic bradycardia 09-2012   s/p Medtronic pacemaker implanted by Dr Rayann Heman     Past Surgical History:  Procedure Laterality Date   AORTIC VALVE REPLACEMENT  2006   BALLOON DILATION N/A 12/16/2013   Procedure: BALLOON DILATION;  Surgeon: Garlan Fair, MD;  Location: [facility] ENDOSCOPY;  Service: Endoscopy;  Laterality: N/A;  CORONARY ARTERY BYPASS GRAFT     ESOPHAGOGASTRODUODENOSCOPY N/A 12/16/2013   Procedure: ESOPHAGOGASTRODUODENOSCOPY (EGD);  Surgeon: Garlan Fair, MD;  Location: [facility];  Service: Endoscopy;  Laterality: N/A;   HERNIA REPAIR     PACEMAKER INSERTION  10-04-2012   Medtronic pacemaker implanted by Dr Rayann Heman for symptomatic bradycardia   PARAESOPHAGEAL HERNIA REPAIR     PERMANENT PACEMAKER INSERTION N/A 10/04/2012   Procedure: PERMANENT PACEMAKER INSERTION;  Surgeon: Thompson Grayer, MD;  Location: [facility] CATH LAB;  Service: Cardiovascular;  Laterality: N/A;   TEE WITHOUT CARDIOVERSION N/A 12/25/2014   Procedure: TRANSESOPHAGEAL ECHOCARDIOGRAM (TEE)   (inpatient at [facility]);  Surgeon: Skeet Latch, MD;  Location: [facility] ENDOSCOPY;   Service: Cardiovascular;  Laterality: N/A;     Current Outpatient Medications  Medication Sig Dispense Refill   acetaminophen (TYLENOL) 325 MG tablet Take 325-650 mg by mouth every 6 (six) hours as needed for mild pain or headache.     Coenzyme Q10 (CO Q-10) 100 MG CAPS 1 capsule with a meal     Elastic Bandages & Supports (V-2 HIGH COMPRESSION HOSE) MISC daily.     famotidine (PEPCID) 20 MG tablet 1 tablet     glimepiride (AMARYL) 1 MG tablet Take 1 mg by mouth 2 (two) times daily.      ketoconazole (NIZORAL) 2 % cream 1 application to affected area     nitroGLYCERIN (NITROSTAT) 0.4 MG SL tablet Place 1 tablet (0.4 mg total) under the tongue every 5 (five) minutes x 3 doses as needed for chest pain. 25 tablet 6   tamsulosin (FLOMAX) 0.4 MG CAPS capsule Take 1 capsule (0.4 mg total) by mouth daily. 30 capsule 0   torsemide (DEMADEX) 20 MG tablet Take 2 tablets (40 mg total) by mouth 2 (two) times daily. 360 tablet 1   warfarin (COUMADIN) 2.5 MG tablet TAKE 1/2 A TABLET TO 1 TABLET BY MOUTH DAILY AS DIRECTED BY THE COUMADIN CLINIC. 90 tablet 1   No current facility-administered medications for this visit.    Allergies:   Demerol [meperidine], Meperidine hcl, Nasal spray, and Simvastatin    Social History:  The patient  reports that he has never smoked. He has never used smokeless tobacco. He reports that he does not drink alcohol and does not use drugs.   Family History:  The patient's family history includes Cancer - Other in his father and mother.    ROS:  Please see the history of present illness.   Otherwise, review of systems are positive for one recent fall.   All other systems are reviewed and negative.    PHYSICAL EXAM: VS:  BP 118/62   Pulse (!) 43   Ht '5\' 10"'$  (1.778 m)   Wt 165 lb (74.8 kg)   SpO2 100%   BMI 23.68 kg/m  , BMI Body mass index is 23.68 kg/m. GEN: Well nourished, well developed, in no acute distress HEENT: normal Neck: no JVD, carotid bruits, or  masses Cardiac: RRR; no murmurs, rubs, or gallops,no edema  Respiratory:  clear to auscultation bilaterally, normal work of breathing GI: soft, nontender, nondistended, + BS MS: no deformity or atrophy Skin: warm and dry, no rash Neuro:  Strength and sensation are intact Psych: euthymic mood, full affect   EKG:   The ekg ordered June 2023 demonstrates A sensed V paced    Recent Labs: No results found for requested labs within last 365 days.   Lipid Panel No results found for: "CHOL", "TRIG", "HDL", "CHOLHDL", "  VLDL", "[place]", "LDLDIRECT"   Other studies Reviewed: Additional studies/ records that were reviewed today with results demonstrating: labs reviewed.   ASSESSMENT AND PLAN:  CAD: No angina. LDL 108.  Intolerant of statins.  AFib: Rate controlled.  Paced rhythm today. Chronic systolic/diastolic heart failure: He appears euvolemic.  He does have some chronic lower extremity swelling which is likely more related to venous insufficiency. S/p AVR: SBE for dental work.  He does feel like he needs some teeth pulled.  Antibiotics called in. DM: A1C 7.8.  Pacer: Battery change planned for November 2023 Anticoagulated: Warfarin dose has been stable.  Managed in the Coumadin clinic.   Current medicines are reviewed at length with the patient today.  The patient concerns regarding his medicines were addressed.  The following changes have been made:  No change  Labs/ tests ordered today include:  No orders of the defined types were placed in this encounter.   Recommend 150 minutes/week of aerobic exercise Low fat, low carb, high fiber diet recommended  Disposition:   FU in  1 year   Signed, Larae Grooms, MD  11/12/2021 3:36 PM    [facility] [place], [place], [place]  [address] Phone: [phone]; Fax: [phone]

## 2021-12-01 DIAGNOSIS — H2513 Age-related nuclear cataract, bilateral: Secondary | ICD-10-CM | POA: Diagnosis not present

## 2021-12-01 DIAGNOSIS — Z135 Encounter for screening for eye and ear disorders: Secondary | ICD-10-CM | POA: Diagnosis not present

## 2021-12-01 DIAGNOSIS — H52223 Regular astigmatism, bilateral: Secondary | ICD-10-CM | POA: Diagnosis not present

## 2021-12-01 DIAGNOSIS — H524 Presbyopia: Secondary | ICD-10-CM | POA: Diagnosis not present

## 2021-12-01 DIAGNOSIS — H5203 Hypermetropia, bilateral: Secondary | ICD-10-CM | POA: Diagnosis not present

## 2021-12-01 DIAGNOSIS — Z961 Presence of intraocular lens: Secondary | ICD-10-CM | POA: Diagnosis not present

## 2021-12-02 ENCOUNTER — Encounter: Payer: Self-pay | Admitting: Podiatry

## 2021-12-02 ENCOUNTER — Ambulatory Visit (INDEPENDENT_AMBULATORY_CARE_PROVIDER_SITE_OTHER): Payer: Medicare Other | Admitting: Podiatry

## 2021-12-02 DIAGNOSIS — D689 Coagulation defect, unspecified: Secondary | ICD-10-CM

## 2021-12-02 DIAGNOSIS — N183 Chronic kidney disease, stage 3 unspecified: Secondary | ICD-10-CM

## 2021-12-02 DIAGNOSIS — B351 Tinea unguium: Secondary | ICD-10-CM

## 2021-12-02 DIAGNOSIS — M79675 Pain in left toe(s): Secondary | ICD-10-CM

## 2021-12-02 DIAGNOSIS — M79674 Pain in right toe(s): Secondary | ICD-10-CM

## 2021-12-02 DIAGNOSIS — E1159 Type 2 diabetes mellitus with other circulatory complications: Secondary | ICD-10-CM

## 2021-12-02 NOTE — Progress Notes (Signed)
This patient returns to my office for at risk foot care.  This patient requires this care by a professional since this patient will be at risk due to having diabetes and chronic kidney disease.  Patient is taking coumadin.  This patient is unable to cut nails himself since the patient cannot reach his nails.These nails are painful walking and wearing shoes.  This patient presents for at risk foot care today.  General Appearance  Alert, conversant and in no acute stress.  Vascular  Dorsalis pedis and posterior tibial  pulses are not  palpable  bilaterally.  Capillary return is within normal limits  bilaterally. Cold feet.  Bilaterally. Absent digital hair. Swelling 2-3+  Neurologic  Senn-Weinstein monofilament wire test within normal limits  bilaterally. Muscle power within normal limits bilaterally.  Nails Thick disfigured discolored nails with subungual debris  from hallux to fifth toes bilaterally. No evidence of bacterial infection or drainage bilaterally. Hematoma subungually left hallux.  Orthopedic  No limitations of motion  feet .  No crepitus or effusions noted.  No bony pathology .  Hammer toes  2-5  B/L.  Skin  Thin  skin with no porokeratosis noted bilaterally.  No signs of infections or ulcers noted.     Onychomycosis  Pain in right toes  Pain in left toes  Consent was obtained for treatment procedures.   Mechanical debridement of nails 1-5  bilaterally performed with a nail nipper.  Filed with dremel without incident.   Return office visit   3 months                  Told patient to return for periodic foot care and evaluation due to potential at risk complications.   Gardiner Barefoot DPM

## 2021-12-05 ENCOUNTER — Other Ambulatory Visit: Payer: Self-pay | Admitting: Interventional Cardiology

## 2021-12-07 ENCOUNTER — Ambulatory Visit (INDEPENDENT_AMBULATORY_CARE_PROVIDER_SITE_OTHER): Payer: Medicare Other

## 2021-12-07 DIAGNOSIS — I4821 Permanent atrial fibrillation: Secondary | ICD-10-CM | POA: Diagnosis not present

## 2021-12-08 LAB — CUP PACEART REMOTE DEVICE CHECK
Battery Impedance: 7192 Ohm
Battery Remaining Longevity: 1 mo — CL
Battery Voltage: 2.63 V
Brady Statistic RV Percent Paced: 99 %
Date Time Interrogation Session: 20231011121926
Implantable Lead Implant Date: 20140807
Implantable Lead Location: 753860
Implantable Lead Model: 5092
Implantable Pulse Generator Implant Date: 20140807
Lead Channel Impedance Value: 0 Ohm
Lead Channel Impedance Value: 535 Ohm
Lead Channel Pacing Threshold Amplitude: 0.75 V
Lead Channel Pacing Threshold Pulse Width: 0.4 ms
Lead Channel Setting Pacing Amplitude: 2.5 V
Lead Channel Setting Pacing Pulse Width: 0.4 ms
Lead Channel Setting Sensing Sensitivity: 2 mV

## 2021-12-15 ENCOUNTER — Ambulatory Visit: Payer: Medicare Other | Attending: Interventional Cardiology | Admitting: *Deleted

## 2021-12-15 DIAGNOSIS — I4821 Permanent atrial fibrillation: Secondary | ICD-10-CM

## 2021-12-15 DIAGNOSIS — Z5181 Encounter for therapeutic drug level monitoring: Secondary | ICD-10-CM | POA: Diagnosis not present

## 2021-12-15 LAB — POCT INR: INR: 2.8 (ref 2.0–3.0)

## 2021-12-15 NOTE — Patient Instructions (Signed)
Description   Continue taking Warfarin 1 tablet every day except 1/2 tablet on Tuesdays and Saturdays.  Recheck in 6 weeks. Call us if any new medications are added or changed. [phone]

## 2021-12-21 NOTE — Progress Notes (Signed)
Remote pacemaker transmission.   

## 2022-01-04 ENCOUNTER — Encounter: Payer: Self-pay | Admitting: Internal Medicine

## 2022-01-04 ENCOUNTER — Ambulatory Visit: Payer: Medicare Other | Attending: Internal Medicine | Admitting: Internal Medicine

## 2022-01-04 VITALS — BP 124/72 | HR 79 | Ht 70.0 in | Wt 163.2 lb

## 2022-01-04 DIAGNOSIS — I2510 Atherosclerotic heart disease of native coronary artery without angina pectoris: Secondary | ICD-10-CM

## 2022-01-04 DIAGNOSIS — Z950 Presence of cardiac pacemaker: Secondary | ICD-10-CM | POA: Insufficient documentation

## 2022-01-04 DIAGNOSIS — I4821 Permanent atrial fibrillation: Secondary | ICD-10-CM | POA: Diagnosis not present

## 2022-01-04 DIAGNOSIS — I442 Atrioventricular block, complete: Secondary | ICD-10-CM | POA: Insufficient documentation

## 2022-01-04 LAB — CUP PACEART INCLINIC DEVICE CHECK
Battery Impedance: 7401 Ohm
Battery Remaining Longevity: 1 mo — CL
Battery Voltage: 2.63 V
Brady Statistic RV Percent Paced: 99 %
Date Time Interrogation Session: 20231107094855
Implantable Lead Connection Status: 753985
Implantable Lead Implant Date: 20140807
Implantable Lead Location: 753860
Implantable Lead Model: 5092
Implantable Pulse Generator Implant Date: 20140807
Lead Channel Impedance Value: 0 Ohm
Lead Channel Impedance Value: 537 Ohm
Lead Channel Pacing Threshold Amplitude: 0.75 V
Lead Channel Pacing Threshold Pulse Width: 0.4 ms
Lead Channel Setting Pacing Amplitude: 2.5 V
Lead Channel Setting Pacing Pulse Width: 0.4 ms
Lead Channel Setting Sensing Sensitivity: 2 mV
Zone Setting Status: 755011

## 2022-01-04 NOTE — Patient Instructions (Addendum)
Medication Instructions:  Your physician recommends that you continue on your current medications as directed. Please refer to the Current Medication list given to you today.  *If you need a refill on your cardiac medications before your next appointment, please call your pharmacy*  Lab Work: None ordered.  If you have labs (blood work) drawn today and your tests are completely normal, you will receive your results only by: [place] (if you have MyChart) OR A paper copy in the mail If you have any lab test that is abnormal or we need to change your treatment, we will call you to review the results.  Testing/Procedures: See Instruction letter.    Follow-Up: At [facility], you and your health needs are our priority.  As part of our continuing mission to provide you with exceptional heart care, we have created designated Provider Care Teams.  These Care Teams include your primary Cardiologist (physician) and Advanced Practice Providers (APPs -  Physician Assistants and Nurse Practitioners) who all work together to provide you with the care you need, when you need it.  We recommend signing up for the patient portal called "MyChart".  Sign up information is provided on this After Visit Summary.  MyChart is used to connect with patients for Virtual Visits (Telemedicine).  Patients are able to view lab/test results, encounter notes, upcoming appointments, etc.  Non-urgent messages can be sent to your provider as well.   To learn more about what you can do with MyChart, go to [email or web address].    Your next appointment:   1 year  The format for your next appointment:   In Person  Provider:   Cristopher Peru, MD{or one of the following Advanced Practice Providers on your designated Care Team:   Tommye Standard, [place] Legrand Como "[facility]" [place], [place]

## 2022-01-04 NOTE — Progress Notes (Signed)
HPI [name] returns today for followup. He is a pleasant 88 yo man with a h/o CHB and chronic atrial fib who was a patient of Dr. Bonita [name]' who returns today for followup. He also has pulmonary HTN, and CAD. He is s/p remote AVR. His PPM was inserted in 2014. Since he last saw Dr. Greggory [name], he has done well. He is just about at [facility].   Allergies  Allergen Reactions   Demerol [Meperidine] Other (See Comments)    Causes hallucinations   Meperidine Hcl Other (See Comments)   Nasal Spray Other (See Comments)   Simvastatin Other (See Comments)     Current Outpatient Medications  Medication Sig Dispense Refill   acetaminophen (TYLENOL) 325 MG tablet Take 325-650 mg by mouth every 6 (six) hours as needed for mild pain or headache.     amoxicillin (AMOXIL) 500 MG tablet Take 4 tablets by mouth 30-60 minutes prior to dental appointments 4 tablet 3   Coenzyme Q10 (CO Q-10) 100 MG CAPS 1 capsule with a meal     Elastic Bandages & Supports (V-2 HIGH COMPRESSION HOSE) MISC daily.     famotidine (PEPCID) 20 MG tablet 1 tablet     glimepiride (AMARYL) 1 MG tablet Take 1 mg by mouth 2 (two) times daily.      ketoconazole (NIZORAL) 2 % cream 1 application to affected area     nitroGLYCERIN (NITROSTAT) 0.4 MG SL tablet Place 1 tablet (0.4 mg total) under the tongue every 5 (five) minutes x 3 doses as needed for chest pain. 25 tablet 6   tamsulosin (FLOMAX) 0.4 MG CAPS capsule Take 1 capsule (0.4 mg total) by mouth daily. 30 capsule 0   torsemide (DEMADEX) 20 MG tablet Take 2 tablets (40 mg total) by mouth 2 (two) times daily. 360 tablet 1   warfarin (COUMADIN) 2.5 MG tablet TAKE 1/2 A TABLET TO 1 TABLET BY MOUTH DAILY AS DIRECTED BY THE COUMADIN CLINIC. 90 tablet 1   No current facility-administered medications for this visit.     Past Medical History:  Diagnosis Date   Aortic stenosis    BPH (benign prostatic hypertrophy)    Coronary artery disease    Dermatophytosis of nail    Diabetes  mellitus without complication (HCC)    type 2   Diverticulosis    DVT (deep venous thrombosis) (HCC)    Gastroesophageal reflux disease    GERD (gastroesophageal reflux disease)    Gout    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    Kidney stones    PAH (pulmonary artery hypertension) ([place])    PAS 63 on ECHO 09/2012   Permanent atrial fibrillation (HCC)    PVD (peripheral vascular disease) (HCC)    Shortness of breath    Symptomatic bradycardia 09-2012   s/p Medtronic pacemaker implanted by Dr [name]     ROS:   All systems reviewed and negative except as noted in the HPI.   Past Surgical History:  Procedure Laterality Date   AORTIC VALVE REPLACEMENT  2006   BALLOON DILATION N/A 12/16/2013   Procedure: BALLOON DILATION;  Surgeon: Fred Fair, MD;  Location: [facility] ENDOSCOPY;  Service: Endoscopy;  Laterality: N/A;   CORONARY ARTERY BYPASS GRAFT     ESOPHAGOGASTRODUODENOSCOPY N/A 12/16/2013   Procedure: ESOPHAGOGASTRODUODENOSCOPY (EGD);  Surgeon: Fred Fair, MD;  Location: [facility];  Service: Endoscopy;  Laterality: N/A;   HERNIA REPAIR     PACEMAKER INSERTION  10-04-2012  Medtronic pacemaker implanted by Dr [name] for symptomatic bradycardia   PARAESOPHAGEAL HERNIA REPAIR     PERMANENT PACEMAKER INSERTION N/A 10/04/2012   Procedure: PERMANENT PACEMAKER INSERTION;  Surgeon: Fred Grayer, MD;  Location: [facility] CATH LAB;  Service: Cardiovascular;  Laterality: N/A;   TEE WITHOUT CARDIOVERSION N/A 12/25/2014   Procedure: TRANSESOPHAGEAL ECHOCARDIOGRAM (TEE)   (inpatient at [facility]);  Surgeon: Fred Latch, MD;  Location: [facility] ENDOSCOPY;  Service: Cardiovascular;  Laterality: N/A;     Family History  Problem Relation Age of Onset   Cancer - Other Mother    Cancer - Other Father    CAD Neg Hx      Social History   Socioeconomic History   Marital status: Married    Spouse name: Not on file   Number of children: Not on file   Years of education: Not on file   Highest  education level: Not on file  Occupational History   Not on file  Tobacco Use   Smoking status: Never   Smokeless tobacco: Never  Vaping Use   Vaping Use: Never used  Substance and Sexual Activity   Alcohol use: No   Drug use: No   Sexual activity: Not on file  Other Topics Concern   Not on file  Social History Narrative   Not on file   Social Determinants of Health   Financial Resource Strain: Not on file  Food Insecurity: Not on file  Transportation Needs: Not on file  Physical Activity: Not on file  Stress: Not on file  Social Connections: Not on file  Intimate Partner Violence: Not on file     BP 124/72   Pulse 79   Ht '5\' 10"'$  (1.778 m)   Wt 163 lb 3.2 oz (74 kg)   SpO2 99%   BMI 23.42 kg/m   Physical Exam:  Well appearing NAD HEENT: Unremarkable Neck:  No JVD, no thyromegally Lymphatics:  No adenopathy Back:  No CVA tenderness Lungs:  Clear with no wheezes HEART:  Regular rate rhythm, soft systolic murmur, no rubs, no clicks;. Abd:  soft, positive bowel sounds, no organomegally, no rebound, no guarding Ext:  2 plus pulses, no edema, no cyanosis, no clubbing Skin:  No rashes no nodules Neuro:  CN II through XII intact, motor grossly intact  EKG  DEVICE  Normal device function.  See PaceArt for details.   Assess/Plan: CHB - he has no escape. He is asymptomatic s/p PPM insertion. He is approaching ERI. Perm atrial fib - he is doing well with no symptoms.  HTN - his bp is controlled. No change in meds. CAD - he denies anginal symptoms.   Fred Overlie Altha Sweitzer,MD

## 2022-01-26 ENCOUNTER — Ambulatory Visit: Payer: Medicare Other | Attending: Interventional Cardiology

## 2022-01-26 DIAGNOSIS — Z5181 Encounter for therapeutic drug level monitoring: Secondary | ICD-10-CM | POA: Diagnosis not present

## 2022-01-26 DIAGNOSIS — I4821 Permanent atrial fibrillation: Secondary | ICD-10-CM

## 2022-01-26 LAB — POCT INR: INR: 3.1 — AB (ref 2.0–3.0)

## 2022-01-26 NOTE — Patient Instructions (Addendum)
Description   Eat a serving of greens today and continue taking Warfarin 1 tablet every day except 1/2 tablet on Tuesdays and Saturdays. Follow pre- procedure instructions for PPM GENERATOR CHANGEOUT  Recheck 1 week post procedure.  Call us if any new medications are added or changed. [phone]

## 2022-02-01 DIAGNOSIS — M519 Unspecified thoracic, thoracolumbar and lumbosacral intervertebral disc disorder: Secondary | ICD-10-CM | POA: Diagnosis not present

## 2022-02-01 DIAGNOSIS — I10 Essential (primary) hypertension: Secondary | ICD-10-CM | POA: Diagnosis not present

## 2022-02-01 DIAGNOSIS — E46 Unspecified protein-calorie malnutrition: Secondary | ICD-10-CM | POA: Diagnosis not present

## 2022-02-01 DIAGNOSIS — I5032 Chronic diastolic (congestive) heart failure: Secondary | ICD-10-CM | POA: Diagnosis not present

## 2022-02-01 DIAGNOSIS — Z23 Encounter for immunization: Secondary | ICD-10-CM | POA: Diagnosis not present

## 2022-02-01 DIAGNOSIS — I25810 Atherosclerosis of coronary artery bypass graft(s) without angina pectoris: Secondary | ICD-10-CM | POA: Diagnosis not present

## 2022-02-01 DIAGNOSIS — E7800 Pure hypercholesterolemia, unspecified: Secondary | ICD-10-CM | POA: Diagnosis not present

## 2022-02-01 DIAGNOSIS — E1122 Type 2 diabetes mellitus with diabetic chronic kidney disease: Secondary | ICD-10-CM | POA: Diagnosis not present

## 2022-02-01 DIAGNOSIS — D6869 Other thrombophilia: Secondary | ICD-10-CM | POA: Diagnosis not present

## 2022-02-01 DIAGNOSIS — Z952 Presence of prosthetic heart valve: Secondary | ICD-10-CM | POA: Diagnosis not present

## 2022-02-01 DIAGNOSIS — N1832 Chronic kidney disease, stage 3b: Secondary | ICD-10-CM | POA: Diagnosis not present

## 2022-02-01 DIAGNOSIS — I482 Chronic atrial fibrillation, unspecified: Secondary | ICD-10-CM | POA: Diagnosis not present

## 2022-02-03 ENCOUNTER — Ambulatory Visit (INDEPENDENT_AMBULATORY_CARE_PROVIDER_SITE_OTHER): Payer: Medicare Other

## 2022-02-03 DIAGNOSIS — I441 Atrioventricular block, second degree: Secondary | ICD-10-CM | POA: Diagnosis not present

## 2022-02-04 LAB — CUP PACEART REMOTE DEVICE CHECK
Battery Impedance: 7684 Ohm
Battery Remaining Longevity: 1 mo — CL
Battery Voltage: 2.61 V
Brady Statistic RV Percent Paced: 100 %
Date Time Interrogation Session: 20231208120948
Implantable Lead Connection Status: 753985
Implantable Lead Implant Date: 20140807
Implantable Lead Location: 753860
Implantable Lead Model: 5092
Implantable Pulse Generator Implant Date: 20140807
Lead Channel Impedance Value: 0 Ohm
Lead Channel Impedance Value: 542 Ohm
Lead Channel Pacing Threshold Amplitude: 0.75 V
Lead Channel Pacing Threshold Pulse Width: 0.4 ms
Lead Channel Setting Pacing Amplitude: 2.5 V
Lead Channel Setting Pacing Pulse Width: 0.4 ms
Lead Channel Setting Sensing Sensitivity: 2 mV
Zone Setting Status: 755011

## 2022-02-14 ENCOUNTER — Other Ambulatory Visit: Payer: Medicare Other

## 2022-02-16 ENCOUNTER — Ambulatory Visit: Payer: Medicare Other | Attending: Internal Medicine

## 2022-02-16 DIAGNOSIS — Z950 Presence of cardiac pacemaker: Secondary | ICD-10-CM | POA: Diagnosis not present

## 2022-02-16 DIAGNOSIS — I442 Atrioventricular block, complete: Secondary | ICD-10-CM

## 2022-02-16 DIAGNOSIS — I4821 Permanent atrial fibrillation: Secondary | ICD-10-CM

## 2022-02-16 LAB — PROTIME-INR
INR: 2.1 — ABNORMAL HIGH (ref 0.9–1.2)
Prothrombin Time: 20.7 s — ABNORMAL HIGH (ref 9.1–12.0)

## 2022-02-16 LAB — CBC WITH DIFFERENTIAL/PLATELET

## 2022-02-17 LAB — CBC WITH DIFFERENTIAL/PLATELET
Basophils Absolute: 0.1 10*3/uL (ref 0.0–0.2)
Basos: 1 %
EOS (ABSOLUTE): 0.1 10*3/uL (ref 0.0–0.4)
Eos: 1 %
Hematocrit: 34 % — ABNORMAL LOW (ref 37.5–51.0)
Hemoglobin: 10.9 g/dL — ABNORMAL LOW (ref 13.0–17.7)
Immature Grans (Abs): 0 10*3/uL (ref 0.0–0.1)
Immature Granulocytes: 0 %
Lymphocytes Absolute: 1 10*3/uL (ref 0.7–3.1)
Lymphs: 15 %
MCH: 26.8 pg (ref 26.6–33.0)
MCHC: 32.1 g/dL (ref 31.5–35.7)
MCV: 84 fL (ref 79–97)
Monocytes Absolute: 0.6 10*3/uL (ref 0.1–0.9)
Monocytes: 9 %
Neutrophils Absolute: 5.2 10*3/uL (ref 1.4–7.0)
Neutrophils: 74 %
Platelets: 207 10*3/uL (ref 150–450)
RBC: 4.07 x10E6/uL — ABNORMAL LOW (ref 4.14–5.80)
RDW: 14.6 % (ref 11.6–15.4)
WBC: 7 10*3/uL (ref 3.4–10.8)

## 2022-02-17 LAB — BASIC METABOLIC PANEL
BUN/Creatinine Ratio: 18 (ref 10–24)
BUN: 38 mg/dL — ABNORMAL HIGH (ref 10–36)
CO2: 28 mmol/L (ref 20–29)
Calcium: 9.1 mg/dL (ref 8.6–10.2)
Chloride: 98 mmol/L (ref 96–106)
Creatinine, Ser: 2.16 mg/dL — ABNORMAL HIGH (ref 0.76–1.27)
Glucose: 180 mg/dL — ABNORMAL HIGH (ref 70–99)
Potassium: 4.4 mmol/L (ref 3.5–5.2)
Sodium: 139 mmol/L (ref 134–144)
eGFR: 28 mL/min/{1.73_m2} — ABNORMAL LOW (ref >59)

## 2022-02-25 NOTE — Progress Notes (Signed)
Remote pacemaker transmission.   

## 2022-03-01 ENCOUNTER — Inpatient Hospital Stay (HOSPITAL_COMMUNITY)
Admission: EM | Admit: 2022-03-01 | Discharge: 2022-03-06 | DRG: 193 | Disposition: A | Discharge status: Skilled Nursing Facility | Payer: Medicare Other | Attending: Internal Medicine | Admitting: Internal Medicine

## 2022-03-01 ENCOUNTER — Emergency Department (HOSPITAL_COMMUNITY): Payer: Medicare Other

## 2022-03-01 ENCOUNTER — Telehealth: Payer: Self-pay | Admitting: Internal Medicine

## 2022-03-01 ENCOUNTER — Other Ambulatory Visit: Payer: Self-pay

## 2022-03-01 ENCOUNTER — Encounter (HOSPITAL_COMMUNITY): Payer: Self-pay

## 2022-03-01 DIAGNOSIS — G9341 Metabolic encephalopathy: Secondary | ICD-10-CM | POA: Diagnosis present

## 2022-03-01 DIAGNOSIS — I4891 Unspecified atrial fibrillation: Secondary | ICD-10-CM | POA: Diagnosis present

## 2022-03-01 DIAGNOSIS — E876 Hypokalemia: Secondary | ICD-10-CM | POA: Diagnosis present

## 2022-03-01 DIAGNOSIS — R531 Weakness: Secondary | ICD-10-CM

## 2022-03-01 DIAGNOSIS — L538 Other specified erythematous conditions: Secondary | ICD-10-CM | POA: Diagnosis not present

## 2022-03-01 DIAGNOSIS — R079 Chest pain, unspecified: Secondary | ICD-10-CM | POA: Diagnosis not present

## 2022-03-01 DIAGNOSIS — B348 Other viral infections of unspecified site: Secondary | ICD-10-CM | POA: Diagnosis present

## 2022-03-01 DIAGNOSIS — Z885 Allergy status to narcotic agent status: Secondary | ICD-10-CM | POA: Diagnosis not present

## 2022-03-01 DIAGNOSIS — E1151 Type 2 diabetes mellitus with diabetic peripheral angiopathy without gangrene: Secondary | ICD-10-CM | POA: Diagnosis present

## 2022-03-01 DIAGNOSIS — I130 Hypertensive heart and chronic kidney disease with heart failure and stage 1 through stage 4 chronic kidney disease, or unspecified chronic kidney disease: Secondary | ICD-10-CM | POA: Diagnosis present

## 2022-03-01 DIAGNOSIS — I442 Atrioventricular block, complete: Secondary | ICD-10-CM | POA: Diagnosis not present

## 2022-03-01 DIAGNOSIS — Z7901 Long term (current) use of anticoagulants: Secondary | ICD-10-CM | POA: Diagnosis not present

## 2022-03-01 DIAGNOSIS — R5383 Other fatigue: Secondary | ICD-10-CM | POA: Diagnosis not present

## 2022-03-01 DIAGNOSIS — N183 Chronic kidney disease, stage 3 unspecified: Secondary | ICD-10-CM | POA: Diagnosis not present

## 2022-03-01 DIAGNOSIS — R609 Edema, unspecified: Secondary | ICD-10-CM | POA: Diagnosis not present

## 2022-03-01 DIAGNOSIS — I2510 Atherosclerotic heart disease of native coronary artery without angina pectoris: Secondary | ICD-10-CM | POA: Diagnosis not present

## 2022-03-01 DIAGNOSIS — Z1152 Encounter for screening for COVID-19: Secondary | ICD-10-CM | POA: Diagnosis not present

## 2022-03-01 DIAGNOSIS — D509 Iron deficiency anemia, unspecified: Secondary | ICD-10-CM | POA: Diagnosis present

## 2022-03-01 DIAGNOSIS — Z950 Presence of cardiac pacemaker: Secondary | ICD-10-CM | POA: Diagnosis present

## 2022-03-01 DIAGNOSIS — I4821 Permanent atrial fibrillation: Secondary | ICD-10-CM | POA: Diagnosis present

## 2022-03-01 DIAGNOSIS — N400 Enlarged prostate without lower urinary tract symptoms: Secondary | ICD-10-CM | POA: Diagnosis present

## 2022-03-01 DIAGNOSIS — I10 Essential (primary) hypertension: Secondary | ICD-10-CM | POA: Diagnosis not present

## 2022-03-01 DIAGNOSIS — K219 Gastro-esophageal reflux disease without esophagitis: Secondary | ICD-10-CM | POA: Diagnosis present

## 2022-03-01 DIAGNOSIS — E1122 Type 2 diabetes mellitus with diabetic chronic kidney disease: Secondary | ICD-10-CM | POA: Diagnosis present

## 2022-03-01 DIAGNOSIS — R0602 Shortness of breath: Secondary | ICD-10-CM | POA: Diagnosis not present

## 2022-03-01 DIAGNOSIS — R230 Cyanosis: Secondary | ICD-10-CM | POA: Diagnosis not present

## 2022-03-01 DIAGNOSIS — I5043 Acute on chronic combined systolic (congestive) and diastolic (congestive) heart failure: Secondary | ICD-10-CM | POA: Diagnosis not present

## 2022-03-01 DIAGNOSIS — Z951 Presence of aortocoronary bypass graft: Secondary | ICD-10-CM

## 2022-03-01 DIAGNOSIS — N184 Chronic kidney disease, stage 4 (severe): Secondary | ICD-10-CM | POA: Diagnosis present

## 2022-03-01 DIAGNOSIS — Z952 Presence of prosthetic heart valve: Secondary | ICD-10-CM

## 2022-03-01 DIAGNOSIS — R062 Wheezing: Secondary | ICD-10-CM

## 2022-03-01 DIAGNOSIS — I2721 Secondary pulmonary arterial hypertension: Secondary | ICD-10-CM | POA: Diagnosis present

## 2022-03-01 DIAGNOSIS — E1159 Type 2 diabetes mellitus with other circulatory complications: Secondary | ICD-10-CM | POA: Diagnosis present

## 2022-03-01 DIAGNOSIS — Z7401 Bed confinement status: Secondary | ICD-10-CM | POA: Diagnosis not present

## 2022-03-01 DIAGNOSIS — J122 Parainfluenza virus pneumonia: Secondary | ICD-10-CM | POA: Diagnosis present

## 2022-03-01 DIAGNOSIS — N179 Acute kidney failure, unspecified: Secondary | ICD-10-CM | POA: Diagnosis present

## 2022-03-01 DIAGNOSIS — J189 Pneumonia, unspecified organism: Principal | ICD-10-CM | POA: Diagnosis present

## 2022-03-01 DIAGNOSIS — R0902 Hypoxemia: Secondary | ICD-10-CM | POA: Diagnosis not present

## 2022-03-01 DIAGNOSIS — J9601 Acute respiratory failure with hypoxia: Secondary | ICD-10-CM | POA: Diagnosis present

## 2022-03-01 DIAGNOSIS — R791 Abnormal coagulation profile: Secondary | ICD-10-CM | POA: Diagnosis present

## 2022-03-01 DIAGNOSIS — N1832 Chronic kidney disease, stage 3b: Secondary | ICD-10-CM | POA: Diagnosis not present

## 2022-03-01 DIAGNOSIS — Z87442 Personal history of urinary calculi: Secondary | ICD-10-CM

## 2022-03-01 DIAGNOSIS — I959 Hypotension, unspecified: Secondary | ICD-10-CM | POA: Diagnosis not present

## 2022-03-01 DIAGNOSIS — Z888 Allergy status to other drugs, medicaments and biological substances status: Secondary | ICD-10-CM | POA: Diagnosis not present

## 2022-03-01 DIAGNOSIS — R7989 Other specified abnormal findings of blood chemistry: Secondary | ICD-10-CM

## 2022-03-01 DIAGNOSIS — Z86718 Personal history of other venous thrombosis and embolism: Secondary | ICD-10-CM

## 2022-03-01 LAB — BASIC METABOLIC PANEL
Anion gap: 15 (ref 5–15)
BUN: 43 mg/dL — ABNORMAL HIGH (ref 8–23)
CO2: 27 mmol/L (ref 22–32)
Calcium: 8.6 mg/dL — ABNORMAL LOW (ref 8.9–10.3)
Chloride: 96 mmol/L — ABNORMAL LOW (ref 98–111)
Creatinine, Ser: 2.24 mg/dL — ABNORMAL HIGH (ref 0.61–1.24)
GFR, Estimated: 26 mL/min — ABNORMAL LOW (ref >60)
Glucose, Bld: 165 mg/dL — ABNORMAL HIGH (ref 70–99)
Potassium: 3.2 mmol/L — ABNORMAL LOW (ref 3.5–5.1)
Sodium: 138 mmol/L (ref 135–145)

## 2022-03-01 LAB — I-STAT VENOUS BLOOD GAS, ED
Acid-Base Excess: 6 mmol/L — ABNORMAL HIGH (ref 0.0–2.0)
Bicarbonate: 30.2 mmol/L — ABNORMAL HIGH (ref 20.0–28.0)
Calcium, Ion: 1 mmol/L — ABNORMAL LOW (ref 1.15–1.40)
HCT: 37 % — ABNORMAL LOW (ref 39.0–52.0)
Hemoglobin: 12.6 g/dL — ABNORMAL LOW (ref 13.0–17.0)
O2 Saturation: 47 %
Potassium: 3.3 mmol/L — ABNORMAL LOW (ref 3.5–5.1)
Sodium: 139 mmol/L (ref 135–145)
TCO2: 32 mmol/L (ref 22–32)
pCO2, Ven: 42.6 mmHg — ABNORMAL LOW (ref 44–60)
pH, Ven: 7.459 — ABNORMAL HIGH (ref 7.25–7.43)
pO2, Ven: 24 mmHg — CL (ref 32–45)

## 2022-03-01 LAB — CBC WITH DIFFERENTIAL/PLATELET
Abs Immature Granulocytes: 0.02 10*3/uL (ref 0.00–0.07)
Basophils Absolute: 0 10*3/uL (ref 0.0–0.1)
Basophils Relative: 0 %
Eosinophils Absolute: 0 10*3/uL (ref 0.0–0.5)
Eosinophils Relative: 0 %
HCT: 37.6 % — ABNORMAL LOW (ref 39.0–52.0)
Hemoglobin: 11.3 g/dL — ABNORMAL LOW (ref 13.0–17.0)
Immature Granulocytes: 0 %
Lymphocytes Relative: 12 %
Lymphs Abs: 1 10*3/uL (ref 0.7–4.0)
MCH: 26.7 pg (ref 26.0–34.0)
MCHC: 30.1 g/dL (ref 30.0–36.0)
MCV: 88.7 fL (ref 80.0–100.0)
Monocytes Absolute: 0.5 10*3/uL (ref 0.1–1.0)
Monocytes Relative: 5 %
Neutro Abs: 7.4 10*3/uL (ref 1.7–7.7)
Neutrophils Relative %: 83 %
Platelets: 241 10*3/uL (ref 150–400)
RBC: 4.24 MIL/uL (ref 4.22–5.81)
RDW: 16.3 % — ABNORMAL HIGH (ref 11.5–15.5)
WBC: 9 10*3/uL (ref 4.0–10.5)
nRBC: 0 % (ref 0.0–0.2)

## 2022-03-01 LAB — RESP PANEL BY RT-PCR (RSV, FLU A&B, COVID)  RVPGX2
Influenza A by PCR: NEGATIVE
Influenza B by PCR: NEGATIVE
Resp Syncytial Virus by PCR: NEGATIVE
SARS Coronavirus 2 by RT PCR: NEGATIVE

## 2022-03-01 LAB — TROPONIN I (HIGH SENSITIVITY): Troponin I (High Sensitivity): 38 ng/L — ABNORMAL HIGH (ref <18)

## 2022-03-01 LAB — BRAIN NATRIURETIC PEPTIDE: B Natriuretic Peptide: 308 pg/mL — ABNORMAL HIGH (ref 0.0–100.0)

## 2022-03-01 NOTE — ED Provider Triage Note (Signed)
Emergency Medicine Provider Triage Evaluation Note  [name] , a 89 y.o. male  was evaluated in triage.  Pt complains of shortness of breath, fever, cough, wheezing.  History of similar.  History of A-fib, diabetes, hypertension, CKD, heart failure.  Brought in by EMS satting 88%.  Treated with DuoNebs x 2 with mild symptomatic improvement.  Does not wear oxygen at baseline he does not think. Patient is intermittently confused.  Does not know how long he has been sick for. Fever/cough/congestion  Review of Systems  Positive: SOB/Fever/Cough Negative:   Physical Exam  BP (!) 93/40   Pulse (!) 59   Temp (!) 97.4 F (36.3 C) (Oral)   Resp 16   SpO2 (!) 87%  Gen:   Awake, no distress   Resp:  Normal effort  MSK:   Moves extremities without difficulty  Other:    Medical Decision Making  Medically screening exam initiated at 8:29 PM.  Appropriate orders placed.  Sol Passer Jakubek was informed that the remainder of the evaluation will be completed by another provider, this initial triage assessment does not replace that evaluation, and the importance of remaining in the ED until their evaluation is complete.  Patient is notably ill per his vitals.[name]  He is going to require a bed soon as possible.  Unfortunately no bed outside of triage area available at this moment.  Hospital operating at critical capacity.    [name], MD 03/01/22 2032

## 2022-03-01 NOTE — Telephone Encounter (Signed)
Patient's daughter is requesting to reschedule 1/03 gen change with Dr. Lovena Le. She states she patient is sick.

## 2022-03-01 NOTE — ED Triage Notes (Signed)
Arrives EMS from home with c/o coughing, weakness, fatigue and shob . 1 week. Was 88% room air on arrival no home oxygen.   Received 2 duonebs en route and o2 improved to 94% after.   Supposed to get pacemaker batteries changed next week.

## 2022-03-01 NOTE — Telephone Encounter (Signed)
I had to call pt's Daughter back. Someone had already scheduled on that time slot. She would like to move it to 04/04/22 @ 11:30.

## 2022-03-01 NOTE — Telephone Encounter (Signed)
Spoke with pt's Daughter. He is rescheduled for 03/28/22 @ 3:30. She is aware of his updated information. Arrival time is 1:30 and he is to hold Coumadin for 2 days.  Scheduled lab appointment for 03/15/22.

## 2022-03-02 ENCOUNTER — Emergency Department (HOSPITAL_BASED_OUTPATIENT_CLINIC_OR_DEPARTMENT_OTHER): Payer: Medicare Other

## 2022-03-02 ENCOUNTER — Observation Stay (HOSPITAL_COMMUNITY): Payer: Medicare Other

## 2022-03-02 ENCOUNTER — Emergency Department (HOSPITAL_COMMUNITY): Payer: Medicare Other

## 2022-03-02 ENCOUNTER — Encounter (HOSPITAL_COMMUNITY): Payer: Self-pay | Admitting: Internal Medicine

## 2022-03-02 DIAGNOSIS — I10 Essential (primary) hypertension: Secondary | ICD-10-CM | POA: Diagnosis not present

## 2022-03-02 DIAGNOSIS — R609 Edema, unspecified: Secondary | ICD-10-CM | POA: Diagnosis not present

## 2022-03-02 DIAGNOSIS — I442 Atrioventricular block, complete: Secondary | ICD-10-CM | POA: Diagnosis not present

## 2022-03-02 DIAGNOSIS — N1832 Chronic kidney disease, stage 3b: Secondary | ICD-10-CM

## 2022-03-02 DIAGNOSIS — Z950 Presence of cardiac pacemaker: Secondary | ICD-10-CM

## 2022-03-02 DIAGNOSIS — R531 Weakness: Secondary | ICD-10-CM

## 2022-03-02 DIAGNOSIS — Z952 Presence of prosthetic heart valve: Secondary | ICD-10-CM

## 2022-03-02 DIAGNOSIS — K219 Gastro-esophageal reflux disease without esophagitis: Secondary | ICD-10-CM

## 2022-03-02 DIAGNOSIS — J9601 Acute respiratory failure with hypoxia: Secondary | ICD-10-CM | POA: Diagnosis not present

## 2022-03-02 DIAGNOSIS — E1159 Type 2 diabetes mellitus with other circulatory complications: Secondary | ICD-10-CM | POA: Diagnosis not present

## 2022-03-02 DIAGNOSIS — I4821 Permanent atrial fibrillation: Secondary | ICD-10-CM

## 2022-03-02 DIAGNOSIS — R079 Chest pain, unspecified: Secondary | ICD-10-CM | POA: Diagnosis not present

## 2022-03-02 DIAGNOSIS — I2510 Atherosclerotic heart disease of native coronary artery without angina pectoris: Secondary | ICD-10-CM | POA: Diagnosis not present

## 2022-03-02 DIAGNOSIS — R0602 Shortness of breath: Secondary | ICD-10-CM | POA: Diagnosis not present

## 2022-03-02 DIAGNOSIS — L538 Other specified erythematous conditions: Secondary | ICD-10-CM | POA: Diagnosis not present

## 2022-03-02 LAB — TROPONIN I (HIGH SENSITIVITY): Troponin I (High Sensitivity): 32 ng/L — ABNORMAL HIGH (ref <18)

## 2022-03-02 LAB — CBG MONITORING, ED: Glucose-Capillary: 350 mg/dL — ABNORMAL HIGH (ref 70–99)

## 2022-03-02 LAB — BASIC METABOLIC PANEL
Anion gap: 22 — ABNORMAL HIGH (ref 5–15)
BUN: 55 mg/dL — ABNORMAL HIGH (ref 8–23)
CO2: 21 mmol/L — ABNORMAL LOW (ref 22–32)
Calcium: 8.6 mg/dL — ABNORMAL LOW (ref 8.9–10.3)
Chloride: 94 mmol/L — ABNORMAL LOW (ref 98–111)
Creatinine, Ser: 2.99 mg/dL — ABNORMAL HIGH (ref 0.61–1.24)
GFR, Estimated: 19 mL/min — ABNORMAL LOW (ref >60)
Glucose, Bld: 383 mg/dL — ABNORMAL HIGH (ref 70–99)
Potassium: 4 mmol/L (ref 3.5–5.1)
Sodium: 137 mmol/L (ref 135–145)

## 2022-03-02 LAB — D-DIMER, QUANTITATIVE: D-Dimer, Quant: 1.86 ug/mL-FEU — ABNORMAL HIGH (ref 0.00–0.50)

## 2022-03-02 LAB — PROTIME-INR
INR: 3.1 — ABNORMAL HIGH (ref 0.8–1.2)
Prothrombin Time: 31.5 seconds — ABNORMAL HIGH (ref 11.4–15.2)

## 2022-03-02 LAB — PROCALCITONIN: Procalcitonin: 0.36 ng/mL

## 2022-03-02 LAB — MAGNESIUM: Magnesium: 2.3 mg/dL (ref 1.7–2.4)

## 2022-03-02 MED ORDER — SODIUM CHLORIDE 0.9 % IV BOLUS
500.0000 mL | Freq: Once | INTRAVENOUS | Status: AC
  Administered 2022-03-02: 500 mL via INTRAVENOUS

## 2022-03-02 MED ORDER — ALBUTEROL SULFATE (2.5 MG/3ML) 0.083% IN NEBU: 2.5000 mg | INHALATION_SOLUTION | RESPIRATORY_TRACT | Status: DC | PRN

## 2022-03-02 MED ORDER — POLYETHYLENE GLYCOL 3350 17 G PO PACK: 17.0000 g | PACK | Freq: Every day | ORAL | Status: DC | PRN

## 2022-03-02 MED ORDER — SODIUM CHLORIDE 0.9 % IV SOLN
1.0000 g | Freq: Once | INTRAVENOUS | Status: AC
  Administered 2022-03-02: 1 g via INTRAVENOUS
  Filled 2022-03-02: qty 10

## 2022-03-02 MED ORDER — ACETAMINOPHEN 325 MG PO TABS
650.0000 mg | ORAL_TABLET | Freq: Four times a day (QID) | ORAL | Status: DC | PRN
  Administered 2022-03-06: 650 mg via ORAL
  Filled 2022-03-02: qty 2

## 2022-03-02 MED ORDER — SODIUM CHLORIDE 0.9% FLUSH
3.0000 mL | Freq: Two times a day (BID) | INTRAVENOUS | Status: DC
  Administered 2022-03-02 – 2022-03-06 (×6): 3 mL via INTRAVENOUS

## 2022-03-02 MED ORDER — TAMSULOSIN HCL 0.4 MG PO CAPS
0.4000 mg | ORAL_CAPSULE | Freq: Every day | ORAL | Status: DC
  Administered 2022-03-03 – 2022-03-06 (×4): 0.4 mg via ORAL
  Filled 2022-03-02 (×4): qty 1

## 2022-03-02 MED ORDER — INSULIN ASPART 100 UNIT/ML IJ SOLN
0.0000 [IU] | Freq: Three times a day (TID) | INTRAMUSCULAR | Status: DC
  Administered 2022-03-02: 7 [IU] via SUBCUTANEOUS
  Administered 2022-03-03 (×2): 3 [IU] via SUBCUTANEOUS
  Administered 2022-03-04: 7 [IU] via SUBCUTANEOUS
  Administered 2022-03-04 – 2022-03-05 (×2): 2 [IU] via SUBCUTANEOUS
  Administered 2022-03-05 (×2): 1 [IU] via SUBCUTANEOUS
  Administered 2022-03-06: 3 [IU] via SUBCUTANEOUS

## 2022-03-02 MED ORDER — FAMOTIDINE 20 MG PO TABS
20.0000 mg | ORAL_TABLET | Freq: Every day | ORAL | Status: DC
  Administered 2022-03-03 – 2022-03-06 (×4): 20 mg via ORAL
  Filled 2022-03-02 (×4): qty 1

## 2022-03-02 MED ORDER — SODIUM CHLORIDE 0.9 % IV SOLN
500.0000 mg | Freq: Once | INTRAVENOUS | Status: AC
  Administered 2022-03-02: 500 mg via INTRAVENOUS
  Filled 2022-03-02: qty 5

## 2022-03-02 MED ORDER — ACETAMINOPHEN 650 MG RE SUPP: 650.0000 mg | Freq: Four times a day (QID) | RECTAL | Status: DC | PRN

## 2022-03-02 MED ORDER — TECHNETIUM TO 99M ALBUMIN AGGREGATED
3.7000 | Freq: Once | INTRAVENOUS | Status: AC | PRN
  Administered 2022-03-02: 3.7 via INTRAVENOUS

## 2022-03-02 MED ORDER — WARFARIN - PHARMACIST DOSING INPATIENT: Freq: Every day | Status: DC

## 2022-03-02 MED ORDER — POTASSIUM CHLORIDE 20 MEQ PO PACK
40.0000 meq | PACK | Freq: Once | ORAL | Status: AC
  Administered 2022-03-02: 40 meq via ORAL
  Filled 2022-03-02: qty 2

## 2022-03-02 NOTE — Progress Notes (Addendum)
ANTICOAGULATION CONSULT NOTE - Initial Consult  Pharmacy Consult for warfarin  Indication: atrial fibrillation  Allergies  Allergen Reactions   Demerol [Meperidine] Other (See Comments)    Causes hallucinations   Meperidine Hcl Other (See Comments)   Nasal Spray Other (See Comments)   Simvastatin Other (See Comments)    Patient Measurements:    Vital Signs: Temp: 97.8 F (36.6 C) (01/03 1753) Temp Source: Oral (01/03 1753) BP: 108/81 (01/03 1753) Pulse Rate: 70 (01/03 1753)  Labs: Recent Labs    03/01/22 2055 03/01/22 2102 03/02/22 0825 03/02/22 0829  HGB 11.3* 12.6*  --   --   HCT 37.6* 37.0*  --   --   PLT 241  --   --   --   LABPROT  --   --   --  31.5*  INR  --   --   --  3.1*  CREATININE 2.24*  --  2.99*  --   TROPONINIHS 38*  --  32*  --     CrCl cannot be calculated (Unknown ideal weight.).  Assessment: Patient admitted with SOB. Hx of afib on warfarin PTA. Regimen confirmed with daughter as 1.'25mg'$  Tuesday and Saturday, 2.'5mg'$  all other days. Daughter also reports that patient has not had his weekly leafy greens for the past 2 weeks. INR today is 3.1. Last dose was on 1/2 PM.   Pharmacy consulted to dose warfarin.   Goal of Therapy:  INR 2-3 Monitor platelets by anticoagulation protocol: Yes   Plan:  INR is supratherapeutic, will hold dose today to allow downward trend.  Daily INR checks.   Esmeralda Arthur, PharmD, BCCCP  03/02/2022,6:04 PM

## 2022-03-02 NOTE — ED Provider Notes (Signed)
[facility] EMERGENCY DEPARTMENT Provider Note   CSN: [id number] Arrival date & time: 03/01/22  2021    History  Chief Complaint  Patient presents with   Shortness of Breath    [name] is a 89 y.o. male with hx of afib, CAD, sx brady s/p pacemaker, DM, INR here for evaluation of shortness of breath.  For evaluation of fever, cough, wheezing as well as weakness.  Patient lives by himself.  Found to be hypoxic with EMS.  Was given 2 DuoNebs, no history of COPD, asthma.  Does not wear oxygen at baseline.  Family states patient is very weak.  He is unable to tell me how long he has been feeling unwell.  Has had some swelling to extremities.  Left greater than right.  Has some chronic redness to his left lower extremity.  No recent falls or injuries.  Family concern about dehydration due to decreased p.o. intake.  Some chest tightness with deep breathing.  No history of PE or DVT.  No anticoagulation.  HPI     Home Medications Prior to Admission medications   Medication Sig Start Date End Date Taking? Authorizing Provider  acetaminophen (TYLENOL) 325 MG tablet Take 325-650 mg by mouth every 6 (six) hours as needed for mild pain or headache.    [provider]  amoxicillin (AMOXIL) 500 MG tablet Take 4 tablets by mouth 30-60 minutes prior to dental appointments 11/12/21   Jettie Booze, MD  Coenzyme Q10 (CO Q-10) 100 MG CAPS 1 capsule with a meal    [provider]  Elastic Bandages & Supports (V-2 HIGH COMPRESSION HOSE) MISC daily. 03/21/13   [provider]  famotidine (PEPCID) 20 MG tablet 1 tablet    [provider]  glimepiride (AMARYL) 1 MG tablet Take 1 mg by mouth 2 (two) times daily.     [provider]  ketoconazole (NIZORAL) 2 % cream 1 application to affected area 04/21/10   [provider]  nitroGLYCERIN (NITROSTAT) 0.4 MG SL tablet Place 1 tablet (0.4 mg total) under the tongue every 5 (five)  minutes x 3 doses as needed for chest pain. 10/05/12   Jettie Booze, MD  tamsulosin (FLOMAX) 0.4 MG CAPS capsule Take 1 capsule (0.4 mg total) by mouth daily. 12/30/14   Florencia Reasons, MD  torsemide (DEMADEX) 20 MG tablet Take 2 tablets (40 mg total) by mouth 2 (two) times daily. 09/13/21   Jettie Booze, MD  warfarin (COUMADIN) 2.5 MG tablet TAKE 1/2 A TABLET TO 1 TABLET BY MOUTH DAILY AS DIRECTED BY THE COUMADIN CLINIC. 12/06/21   Jettie Booze, MD      Allergies    Demerol [meperidine], Meperidine hcl, Nasal spray, and Simvastatin    Review of Systems   Review of Systems  Constitutional:  Positive for appetite change, fatigue and fever.  HENT: Negative.    Respiratory:  Positive for cough, chest tightness and shortness of breath.   Cardiovascular:  Positive for leg swelling. Negative for chest pain and palpitations.  Gastrointestinal: Negative.   Genitourinary: Negative.   Musculoskeletal: Negative.   Skin: Negative.   Neurological:  Positive for weakness. Negative for dizziness, tremors, seizures, syncope, facial asymmetry, speech difficulty, light-headedness, numbness and headaches.  All other systems reviewed and are negative.   Physical Exam Updated Vital Signs BP (!) 151/81 (BP Location: Left Arm)   Pulse 60   Temp 98.3 F (36.8 C)   Resp 20  SpO2 100%  Physical Exam Vitals and nursing note reviewed.  Constitutional:      General: He is not in acute distress.    Appearance: He is well-developed. He is ill-appearing. He is not toxic-appearing or diaphoretic.  HENT:     Head: Atraumatic.  Eyes:     Pupils: Pupils are equal, round, and reactive to light.  Cardiovascular:     Rate and Rhythm: Normal rate and regular rhythm.     Pulses: Normal pulses.          Radial pulses are 2+ on the right side and 2+ on the left side.       Dorsalis pedis pulses are 2+ on the right side and 2+ on the left side.     Heart sounds: Normal heart sounds.  Pulmonary:      Effort: Pulmonary effort is normal. No respiratory distress.     Breath sounds: Wheezing present.     Comments: Intermittent wheeze BIL, speaks without difficulty Chest:     Chest wall: No mass, deformity, tenderness, crepitus or edema. There is no dullness to percussion.  Abdominal:     General: Bowel sounds are normal. There is no distension.     Palpations: Abdomen is soft.     Comments: Soft, nontender  Musculoskeletal:        General: Normal range of motion.     Cervical back: Normal range of motion and neck supple.     Right lower leg: Edema present.     Left lower leg: Edema present.     Comments: Compartments soft, trace edema right lower extremity, 1+ pitting edema left lower extremity overlying erythema  Skin:    General: Skin is warm and dry.     Findings: Erythema present.  Neurological:     General: No focal deficit present.     Mental Status: He is alert.     Comments: Follows commands, equal grip strength, global weakness throughout     ED Results / Procedures / Treatments   Labs (all labs ordered are listed, but only abnormal results are displayed) Labs Reviewed  CBC WITH DIFFERENTIAL/PLATELET - Abnormal; Notable for the following components:      Result Value   Hemoglobin 11.3 (*)    HCT 37.6 (*)    RDW 16.3 (*)    All other components within normal limits  BASIC METABOLIC PANEL - Abnormal; Notable for the following components:   Potassium 3.2 (*)    Chloride 96 (*)    Glucose, Bld 165 (*)    BUN 43 (*)    Creatinine, Ser 2.24 (*)    Calcium 8.6 (*)    GFR, Estimated 26 (*)    All other components within normal limits  BRAIN NATRIURETIC PEPTIDE - Abnormal; Notable for the following components:   B Natriuretic Peptide 308.0 (*)    All other components within normal limits  PROTIME-INR - Abnormal; Notable for the following components:   Prothrombin Time 31.5 (*)    INR 3.1 (*)    All other components within normal limits  D-DIMER, QUANTITATIVE -  Abnormal; Notable for the following components:   D-Dimer, Quant 1.86 (*)    All other components within normal limits  I-STAT VENOUS BLOOD GAS, ED - Abnormal; Notable for the following components:   pH, Ven 7.459 (*)    pCO2, Ven 42.6 (*)    pO2, Ven 24 (*)    Bicarbonate 30.2 (*)    Acid-Base Excess 6.0 (*)  Potassium 3.3 (*)    Calcium, Ion 1.00 (*)    HCT 37.0 (*)    Hemoglobin 12.6 (*)    All other components within normal limits  TROPONIN I (HIGH SENSITIVITY) - Abnormal; Notable for the following components:   Troponin I (High Sensitivity) 38 (*)    All other components within normal limits  TROPONIN I (HIGH SENSITIVITY) - Abnormal; Notable for the following components:   Troponin I (High Sensitivity) 32 (*)    All other components within normal limits  RESP PANEL BY RT-PCR (RSV, FLU A&B, COVID)  RVPGX2  RESPIRATORY PANEL BY PCR  BLOOD GAS, VENOUS  MAGNESIUM  PROCALCITONIN  BASIC METABOLIC PANEL    EKG EKG Interpretation  Date/Time:  Tuesday March 01 2022 20:09:47 EST Ventricular Rate:  65 PR Interval:    QRS Duration: 86 QT Interval:  452 QTC Calculation: 470 R Axis:   90 Text Interpretation: Undetermined rhythm Rightward axis ST & T wave abnormality, consider inferior ischemia ST & T wave abnormality, consider anterolateral ischemia Prolonged QT Abnormal ECG Artifact When compared with ECG of 03-Sep-2016 07:40, VENTRICULAR PACED RHYTHM is no longer present Confirmed by Delora Fuel ([id number]) on 03/02/2022 12:06:07 AM  Radiology VAS [place] LOWER EXTREMITY VENOUS (DVT) (ONLY [facility] & [facility])  Result Date: 03/02/2022  Lower Venous DVT Study Patient Name:  JETTIE LAZARE Heishman  Date of Exam:   03/02/2022 Medical Rec #: [id number]            Accession #:    [id number] Date of Birth: 1925-10-18           Patient Gender: M Patient Age:   66 years Exam Location:  [facility] Procedure:      VAS [place] LOWER EXTREMITY VENOUS (DVT) Referring Phys: Nasiir Monts  --------------------------------------------------------------------------------  Indications: Left leg edema, erythema.  Risk Factors: CHF. Comparison Study: No prior studies. Performing Technologist: Darlin Coco RDMS, RVT  Examination Guidelines: A complete evaluation includes B-mode imaging, spectral Doppler, color Doppler, and power Doppler as needed of all accessible portions of each vessel. Bilateral testing is considered an integral part of a complete examination. Limited examinations for reoccurring indications may be performed as noted. The reflux portion of the exam is performed with the patient in reverse Trendelenburg.  +-----+---------------+---------+-----------+----------+--------------+ RIGHTCompressibilityPhasicitySpontaneityPropertiesThrombus Aging +-----+---------------+---------+-----------+----------+--------------+ CFV  Full           No       Yes                                 +-----+---------------+---------+-----------+----------+--------------+   +---------+---------------+---------+-----------+----------+--------------+ LEFT     CompressibilityPhasicitySpontaneityPropertiesThrombus Aging +---------+---------------+---------+-----------+----------+--------------+ CFV      Full           No       Yes                                 +---------+---------------+---------+-----------+----------+--------------+ SFJ      Full                                                        +---------+---------------+---------+-----------+----------+--------------+ FV Prox  Full                                                        +---------+---------------+---------+-----------+----------+--------------+  FV Mid   Full                                                        +---------+---------------+---------+-----------+----------+--------------+ FV DistalFull                                                         +---------+---------------+---------+-----------+----------+--------------+ PFV      Full                                                        +---------+---------------+---------+-----------+----------+--------------+ POP      Full           No       Yes                                 +---------+---------------+---------+-----------+----------+--------------+ PTV      Full                                                        +---------+---------------+---------+-----------+----------+--------------+ PERO     Full                                                        +---------+---------------+---------+-----------+----------+--------------+     Summary: RIGHT: - No evidence of common femoral vein obstruction.  LEFT: - There is no evidence of deep vein thrombosis in the lower extremity.  - No cystic structure found in the popliteal fossa.  -Pulsatile venous waveforms noted, consistent with elevated right heart pressures. *See table(s) above for measurements and observations.    Preliminary    CT Chest Wo Contrast  Result Date: 03/02/2022 CLINICAL DATA:  Shortness of breath, fever and wheezing. EXAM: CT CHEST WITHOUT CONTRAST TECHNIQUE: Multidetector CT imaging of the chest was performed following the standard protocol without IV contrast. RADIATION DOSE REDUCTION: This exam was performed according to the departmental dose-optimization program which includes automated exposure control, adjustment of the mA and/or kV according to patient size and/or use of iterative reconstruction technique. COMPARISON:  X-ray 03/01/2022.  V/Q scan 03/02/2022 FINDINGS: Cardiovascular: Heart is nonenlarged. Prominent coronary artery calcifications there is also prominent calcifications along the course of the normal caliber thoracic aorta. The aorta has some tortuous course. No significant pericardial effusion. Left upper chest battery pack with leads extending towards the right side of the heart.  Pacemaker. Status post median sternotomy. Mediastinum/Nodes: No specific abnormal lymph node enlargement identified in the axillary region or hila on this noncontrast exam. He small less than 1 cm in size in short axis nodes in the mediastinum, not pathologic by  size criteria. Severely elevated left hemidiaphragm herniation in the mediastinum of several structures including portions of stomach, pancreas, small bowel and vasculature. Surgical changes near this area. Please correlate with clinical history. Lungs/Pleura: Associated bandlike opacity left lung base. Atelectasis is favored. Mild right basilar atelectasis or scar as well. No pneumothorax. There is opacification noted of the central airways of the left lower lobe with parenchymal opacities. Also some areas dependently in lingula. Luminal debris along the bronchi. LS versus infiltrate. Aggressive process is not excluded. Please correlate with any history Upper Abdomen: Over left hemidiaphragm hernia as above. The adrenal glands are preserved. Nonobstructive left-sided renal stone. Musculoskeletal: S shaped curvature of the spine multifocal degenerative changes. IMPRESSION: Severely elevated left hemidiaphragm with department of hernia as well. Adjacent parenchymal opacity is identified in the left lower lobe and dependent portion of the lingula with some opacification along bronchi in this location. Possible debris versus lesion. Please correlate with any known history. Additional workup as clinically directed. Pacemaker.  Postop chest. Electronically Signed   By: Jill Side M.D.   On: 03/02/2022 13:13   NM Pulmonary Perfusion  Result Date: 03/02/2022 CLINICAL DATA:  Chest pain.  Shortness of breath. EXAM: NUCLEAR MEDICINE PERFUSION LUNG SCAN TECHNIQUE: Perfusion images were obtained in multiple projections after intravenous injection of radiopharmaceutical. Ventilation scans intentionally deferred if perfusion scan and chest x-ray adequate for  interpretation during COVID 19 epidemic. RADIOPHARMACEUTICALS:  3.6 mCi Tc-22mMAA IV COMPARISON:  X-ray 03/01/2022.  And older FINDINGS: There is preserved perfusion of the right lung diffusely. No significant defects. X-ray demonstrates surgical changes of the left hemithorax with volume loss and elevated left hemidiaphragm. The residual left lung has relatively poor perfusion compared to the right. The distribution pattern is low suspicion for pulmonary embolism overall. However with the appearance if there is no known specific prior imaging or history dedicated anatomic imaging of the chest with CT may be of some benefit IMPRESSION: Volume loss left hemithorax with relatively poor asymmetric perfusion of the residual left lung compared to right. Pattern is low suspicion for pulmonary embolism. However with the appearance CT imaging of the chest may be of benefit to further delineate Electronically Signed   By: AJill SideM.D.   On: 03/02/2022 12:20   DG Chest Portable 1 View  Result Date: 03/01/2022 CLINICAL DATA:  Shortness of breath. EXAM: PORTABLE CHEST 1 VIEW COMPARISON:  09/05/2016. FINDINGS: The heart is enlarged and the mediastinal contour is stable. Atherosclerotic calcification of the aorta is noted. There is chronic elevation of the left diaphragm with atelectasis or infiltrate at the left lung base. No definite effusion or pneumothorax. No pneumothorax. Sternotomy wires are present over the midline. A single lead pacemaker device is present over the left chest. IMPRESSION: 1. Chronic elevation of the left diaphragm with atelectasis or infiltrate at the left lung base, unchanged from prior exams. 2. Cardiomegaly. Electronically Signed   By: LBrett FairyM.D.   On: 03/01/2022 21:23    Procedures Procedures    Medications Ordered in ED Medications  azithromycin (ZITHROMAX) 500 mg in sodium chloride 0.9 % 250 mL IVPB (500 mg Intravenous New Bag/Given 03/02/22 1353)  famotidine (PEPCID) tablet  20 mg (has no administration in time range)  sodium chloride flush (NS) 0.9 % injection 3 mL (has no administration in time range)  acetaminophen (TYLENOL) tablet 650 mg (has no administration in time range)    Or  acetaminophen (TYLENOL) suppository 650 mg (has no administration in time range)  polyethylene glycol (MIRALAX / GLYCOLAX) packet 17 g (has no administration in time range)  albuterol (PROVENTIL) (2.5 MG/3ML) 0.083% nebulizer solution 2.5 mg (has no administration in time range)  potassium chloride (KLOR-CON) packet 40 mEq (has no administration in time range)  insulin aspart (novoLOG) injection 0-9 Units (has no administration in time range)  technetium albumin aggregated (MAA) injection solution 3.7 millicurie (3.7 millicuries Intravenous Contrast Given 03/02/22 1200)  sodium chloride 0.9 % bolus 500 mL (0 mLs Intravenous Stopped 03/02/22 1411)  cefTRIAXone (ROCEPHIN) 1 g in sodium chloride 0.9 % 100 mL IVPB (0 g Intravenous Stopped 03/02/22 1353)   ED Course/ Medical Decision Making/ A&P     89 year old here for evaluation of fever, shortness of breath.  Noted to be hypoxic with EMS and on arrival here.  He got 2 DuoNeb's.  Sounds like that he has had cough, increasing generalized weakness which is new from his baseline.  History of COPD, asthma.  He is on Coumadin for A-fib.  No recent injuries or trauma.  Does note he has had some swelling to his legs, left greater than right, unclear if this is exactly new or not as family and patient have different histories.  Does have very minimal wheeze on exam.  DOE with movement and exertion.  Does not appear to have any significant deficits on exam aside from some global weakness.  Will plan on labs and imaging  Labs and imaging personally viewed and interpreted:  INR 3.1, no active bleeding Troponin 38--32, downtrending, suspect likely demand from infection D-dimer 1.83 BNP 308 COVID, flu, RSV negative VBG pCO2 42, not retaining CBC  without leukocytosis, hemoglobin 11.3 similar to baseline Metabolic panel creatinine 2.24, similar to prior, potassium 3.2 [place] negative for DVT VQ scan negative for PE Chest x-ray with hemidiaphragm elevation EKG without ischemic changes CT chest with elevated hemidiaphragm, likely due to hernia, does have debris, possible infiltrate   Patient initially noted to be hypoxic, got DuoNebs with improvement hypoxia however still has some shortness of breath, diffuse weakness, dyspnea on exertion.  CT chest shows possible pneumonia.  Does have some left lower extremity erythema and swelling however per daughter patient is had this previously, ultrasound negative for VTE, his pulses bilaterally.  Does not appear obviously cellulitic  Discussed results with daughter, patient.  Given his weakness, dyspnea on exertion, possible pneumonia will start on IV antibiotics, admit for further management  CONSULT with Dr. Trilby Drummer with [facility] who is agreeable to evaluate patient for admission  The patient appears reasonably stabilized for admission considering the current resources, flow, and capabilities available in the ED at this time, and I doubt any other [facility] requiring further screening and/or treatment in the ED prior to admission.                           Medical Decision Making Amount and/or Complexity of Data Reviewed Independent Historian:     Details: Daughter at bedside External Data Reviewed: labs, radiology, ECG and notes. Labs: ordered. Decision-making details documented in ED Course. Radiology: ordered and independent interpretation performed. Decision-making details documented in ED Course. ECG/medicine tests: ordered and independent interpretation performed. Decision-making details documented in ED Course.  Risk OTC drugs. Prescription drug management. Parenteral controlled substances. Decision regarding hospitalization. Diagnosis or treatment significantly limited by social determinants of  health.          Final Clinical Impression(s) / ED Diagnoses Final diagnoses:  Pneumonia of left  lung due to infectious organism, unspecified part of lung  Weakness  Wheeze  Elevated troponin    Rx / DC Orders ED Discharge Orders     None         Brenetta Penny A, PA-C 03/02/22 1430    Cristie Hem, MD 03/02/22 1627

## 2022-03-02 NOTE — Progress Notes (Signed)
Lower extremity venous left study completed.  Preliminary results relayed to Truett Mainland, MD.  See CV Proc for preliminary results report.   Darlin Coco, RDMS, RVT

## 2022-03-02 NOTE — ED Notes (Signed)
Received verbal report from [place] RN at this time

## 2022-03-02 NOTE — ED Notes (Signed)
Patient back from vascular.

## 2022-03-02 NOTE — ED Notes (Signed)
When entering room pt had Gir[place] resting on side of face. Pt O2 99% RA  RN removed nasal canula and pt is 98% RA

## 2022-03-02 NOTE — ED Notes (Signed)
Pt had a large bowel movement on himself. Pt cleaned and changed at this time

## 2022-03-02 NOTE — H&P (Addendum)
History and Physical   Tayler Lassen EHU:[id number] DOB: 1925-07-14 DOA: 03/01/2022  PCP: Wenda Low, MD   Patient coming from: Home  Chief Complaint: Weakness, shortness of breath  HPI: [name] is a 89 y.o. male with medical history significant of diabetes, bradycardia, anemia, GERD, status post AVR, status post pacemaker, complete heart block, hypertension, atrial fibrillation, CAD, CKD 3, CHF presenting with shortness of breath and weakness with associated cough and fatigue.  History obtained with assistance of family.  Patient reportedly has had a week of feeling unwell.  He has been experiencing fatigue, cough, weakness and shortness of breath.  He has some chronic lower extremity edema left greater than right with some chronic left-sided erythema.  Family reporting decreased p.o. intake.  Patient also reporting some pain with deep inspiration.  Initially hypoxic and did receive 2 DuoNeb's and route with EMS.  Reportedly is due for pacemaker battery change in the next week or 2.  Does report L ankle/foot pain. He denies fevers, chills, abdominal pain, constipation, diarrhea, nausea, vomiting.  ED Course: Vital signs in the ED significant for blood pressure initially in the 90s but improved to the 150s since.  Hypoxic into the 80s on room air requiring 1 to 2 L to maintain saturations, weaning off oxygen now.  Heart rate in the 50s to 60s.  Lab workup showed BMP with potassium 3.2, chloride 96, BUN 43, creatinine 2.24 from unclear baseline but appears stable from some labs that we have, glucose 165, calcium 8.6.  CBC with hemoglobin stable at 11.3.  PT and INR elevated at 31.5 and 3.1 respectively.  D-dimer elevated 1.86, troponin downtrending at 38 and then 32 on repeat.  BNP borderline at 308.  Respiratory panel for flu COVID and RSV negative.  VBG with pH 7.45 and pCO2 42.6.  Imaging studies include chest x-ray with chronic left elevated hemidiaphragm and left base  atelectasis versus infiltrate.  VQ scan showing low probability for PE.  CT of the chest without contrast showing the elevated left hemidiaphragm and left lower lobe as well as lingular opacities suspicious for debris versus questionable lesion.  Patient received ceftriaxone, azithromycin, half a liter of fluids in the ED.  Review of Systems: As per HPI otherwise all other systems reviewed and are negative.  Past Medical History:  Diagnosis Date   Aortic stenosis    BPH (benign prostatic hypertrophy)    Coronary artery disease    Dermatophytosis of nail    Diabetes mellitus without complication (HCC)    type 2   Diverticulosis    DVT (deep venous thrombosis) (HCC)    Gastroesophageal reflux disease    GERD (gastroesophageal reflux disease)    Gout    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    Kidney stones    PAH (pulmonary artery hypertension) ([place])    PAS 63 on ECHO 09/2012   Permanent atrial fibrillation (HCC)    PVD (peripheral vascular disease) (HCC)    Severe sepsis ([place]) 09/02/2016   Shortness of breath    Symptomatic bradycardia 09/28/2012   s/p Medtronic pacemaker implanted by Dr Rayann Heman     Past Surgical History:  Procedure Laterality Date   AORTIC VALVE REPLACEMENT  2006   BALLOON DILATION N/A 12/16/2013   Procedure: BALLOON DILATION;  Surgeon: Garlan Fair, MD;  Location: [facility] ENDOSCOPY;  Service: Endoscopy;  Laterality: N/A;   CORONARY ARTERY BYPASS GRAFT     ESOPHAGOGASTRODUODENOSCOPY N/A 12/16/2013   Procedure: ESOPHAGOGASTRODUODENOSCOPY (EGD);  Surgeon: Garlan Fair, MD;  Location: [facility];  Service: Endoscopy;  Laterality: N/A;   HERNIA REPAIR     PACEMAKER INSERTION  10-04-2012   Medtronic pacemaker implanted by Dr Rayann Heman for symptomatic bradycardia   PARAESOPHAGEAL HERNIA REPAIR     PERMANENT PACEMAKER INSERTION N/A 10/04/2012   Procedure: PERMANENT PACEMAKER INSERTION;  Surgeon: Thompson Grayer, MD;  Location: [facility] CATH LAB;  Service: Cardiovascular;   Laterality: N/A;   TEE WITHOUT CARDIOVERSION N/A 12/25/2014   Procedure: TRANSESOPHAGEAL ECHOCARDIOGRAM (TEE)   (inpatient at [facility]);  Surgeon: Skeet Latch, MD;  Location: [facility] ENDOSCOPY;  Service: Cardiovascular;  Laterality: N/A;    Social History  reports that he has never smoked. He has never used smokeless tobacco. He reports that he does not drink alcohol and does not use drugs.  Allergies  Allergen Reactions   Demerol [Meperidine] Other (See Comments)    Causes hallucinations   Meperidine Hcl Other (See Comments)   Nasal Spray Other (See Comments)   Simvastatin Other (See Comments)    Family History  Problem Relation Age of Onset   Cancer - Other Mother    Cancer - Other Father    CAD Neg Hx   Reviewed on admission  Prior to Admission medications   Medication Sig Start Date End Date Taking? Authorizing Provider  acetaminophen (TYLENOL) 325 MG tablet Take 325-650 mg by mouth every 6 (six) hours as needed for mild pain or headache.    [provider]  amoxicillin (AMOXIL) 500 MG tablet Take 4 tablets by mouth 30-60 minutes prior to dental appointments 11/12/21   Jettie Booze, MD  Coenzyme Q10 (CO Q-10) 100 MG CAPS 1 capsule with a meal    [provider]  Elastic Bandages & Supports (V-2 HIGH COMPRESSION HOSE) MISC daily. 03/21/13   [provider]  famotidine (PEPCID) 20 MG tablet 1 tablet    [provider]  glimepiride (AMARYL) 1 MG tablet Take 1 mg by mouth 2 (two) times daily.     [provider]  ketoconazole (NIZORAL) 2 % cream 1 application to affected area 04/21/10   [provider]  nitroGLYCERIN (NITROSTAT) 0.4 MG SL tablet Place 1 tablet (0.4 mg total) under the tongue every 5 (five) minutes x 3 doses as needed for chest pain. 10/05/12   Jettie Booze, MD  tamsulosin (FLOMAX) 0.4 MG CAPS capsule Take 1 capsule (0.4 mg total) by mouth daily. 12/30/14   Florencia Reasons, MD  torsemide (DEMADEX) 20 MG tablet  Take 2 tablets (40 mg total) by mouth 2 (two) times daily. 09/13/21   Jettie Booze, MD  warfarin (COUMADIN) 2.5 MG tablet TAKE 1/2 A TABLET TO 1 TABLET BY MOUTH DAILY AS DIRECTED BY THE COUMADIN CLINIC. 12/06/21   Jettie Booze, MD    Physical Exam: Vitals:   03/02/22 1030 03/02/22 1045 03/02/22 1217 03/02/22 1415  BP: (!) 156/84 (!) 154/80 (!) 133/97 (!) 151/81  Pulse: 65 (!) 59 65 60  Resp: (!) 21 (!) '22 19 20  '$ Temp:   98.1 F (36.7 C) 98.3 F (36.8 C)  TempSrc:   Oral   SpO2: 100% 99% 100% 100%    Physical Exam Constitutional:      General: He is not in acute distress.    Appearance: Normal appearance.  HENT:     Head: Normocephalic and atraumatic.     Mouth/Throat:     Mouth: Mucous membranes are moist.     Pharynx: Oropharynx  is clear.  Eyes:     Extraocular Movements: Extraocular movements intact.     Pupils: Pupils are equal, round, and reactive to light.  Cardiovascular:     Rate and Rhythm: Normal rate and regular rhythm.     Pulses: Normal pulses.     Heart sounds: Normal heart sounds.  Pulmonary:     Effort: Pulmonary effort is normal. No respiratory distress.     Breath sounds: Examination of the left-lower field reveals decreased breath sounds. Decreased breath sounds present.  Abdominal:     General: Bowel sounds are normal. There is no distension.     Palpations: Abdomen is soft.     Tenderness: There is no abdominal tenderness.  Musculoskeletal:        General: No swelling or deformity.     Right lower leg: Tenderness (Left foot) present. Edema present.     Left lower leg: Edema present.  Skin:    General: Skin is warm and dry.  Neurological:     General: No focal deficit present.     Mental Status: Mental status is at baseline.    Labs on Admission: I have personally reviewed following labs and imaging studies  CBC: Recent Labs  Lab 03/01/22 2055 03/01/22 2102  WBC 9.0  --   NEUTROABS 7.4  --   HGB 11.3* 12.6*  HCT 37.6* 37.0*   MCV 88.7  --   PLT 241  --     Basic Metabolic Panel: Recent Labs  Lab 03/01/22 2055 03/01/22 2102  NA 138 139  K 3.2* 3.3*  CL 96*  --   CO2 27  --   GLUCOSE 165*  --   BUN 43*  --   CREATININE 2.24*  --   CALCIUM 8.6*  --     GFR: CrCl cannot be calculated (Unknown ideal weight.).  Liver Function Tests: No results for input(s): "AST", "ALT", "ALKPHOS", "BILITOT", "PROT", "ALBUMIN" in the last 168 hours.  Urine analysis:    Component Value Date/Time   COLORURINE YELLOW 09/05/2016 2058   APPEARANCEUR HAZY (A) 09/05/2016 2058   LABSPEC 1.026 09/05/2016 2058   PHURINE 5.0 09/05/2016 2058   GLUCOSEU >=500 (A) 09/05/2016 2058   HGBUR SMALL (A) 09/05/2016 2058   BILIRUBINUR NEGATIVE 09/05/2016 2058   KETONESUR 20 (A) 09/05/2016 2058   PROTEINUR 30 (A) 09/05/2016 2058   UROBILINOGEN 0.2 12/23/2014 0708   NITRITE NEGATIVE 09/05/2016 2058   LEUKOCYTESUR NEGATIVE 09/05/2016 2058    Radiological Exams on Admission: VAS [place] LOWER EXTREMITY VENOUS (DVT) (ONLY [facility] & [facility])  Result Date: 03/02/2022  Lower Venous DVT Study Patient Name:  DEMONTREZ RINDFLEISCH Buffone  Date of Exam:   03/02/2022 Medical Rec #: [id number]            Accession #:    [id number] Date of Birth: 1925/09/28           Patient Gender: M Patient Age:   64 years Exam Location:  [facility] Procedure:      VAS [place] LOWER EXTREMITY VENOUS (DVT) Referring Phys: BRITNI HENDERLY --------------------------------------------------------------------------------  Indications: Left leg edema, erythema.  Risk Factors: CHF. Comparison Study: No prior studies. Performing Technologist: Darlin Coco RDMS, RVT  Examination Guidelines: A complete evaluation includes B-mode imaging, spectral Doppler, color Doppler, and power Doppler as needed of all accessible portions of each vessel. Bilateral testing is considered an integral part of a complete examination. Limited examinations for reoccurring indications may be performed as noted. The  reflux portion  of the exam is performed with the patient in reverse Trendelenburg.  +-----+---------------+---------+-----------+----------+--------------+ RIGHTCompressibilityPhasicitySpontaneityPropertiesThrombus Aging +-----+---------------+---------+-----------+----------+--------------+ CFV  Full           No       Yes                                 +-----+---------------+---------+-----------+----------+--------------+   +---------+---------------+---------+-----------+----------+--------------+ LEFT     CompressibilityPhasicitySpontaneityPropertiesThrombus Aging +---------+---------------+---------+-----------+----------+--------------+ CFV      Full           No       Yes                                 +---------+---------------+---------+-----------+----------+--------------+ SFJ      Full                                                        +---------+---------------+---------+-----------+----------+--------------+ FV Prox  Full                                                        +---------+---------------+---------+-----------+----------+--------------+ FV Mid   Full                                                        +---------+---------------+---------+-----------+----------+--------------+ FV DistalFull                                                        +---------+---------------+---------+-----------+----------+--------------+ PFV      Full                                                        +---------+---------------+---------+-----------+----------+--------------+ POP      Full           No       Yes                                 +---------+---------------+---------+-----------+----------+--------------+ PTV      Full                                                        +---------+---------------+---------+-----------+----------+--------------+ PERO     Full                                                         +---------+---------------+---------+-----------+----------+--------------+  Summary: RIGHT: - No evidence of common femoral vein obstruction.  LEFT: - There is no evidence of deep vein thrombosis in the lower extremity.  - No cystic structure found in the popliteal fossa.  -Pulsatile venous waveforms noted, consistent with elevated right heart pressures. *See table(s) above for measurements and observations.    Preliminary    CT Chest Wo Contrast  Result Date: 03/02/2022 CLINICAL DATA:  Shortness of breath, fever and wheezing. EXAM: CT CHEST WITHOUT CONTRAST TECHNIQUE: Multidetector CT imaging of the chest was performed following the standard protocol without IV contrast. RADIATION DOSE REDUCTION: This exam was performed according to the departmental dose-optimization program which includes automated exposure control, adjustment of the mA and/or kV according to patient size and/or use of iterative reconstruction technique. COMPARISON:  X-ray 03/01/2022.  V/Q scan 03/02/2022 FINDINGS: Cardiovascular: Heart is nonenlarged. Prominent coronary artery calcifications there is also prominent calcifications along the course of the normal caliber thoracic aorta. The aorta has some tortuous course. No significant pericardial effusion. Left upper chest battery pack with leads extending towards the right side of the heart. Pacemaker. Status post median sternotomy. Mediastinum/Nodes: No specific abnormal lymph node enlargement identified in the axillary region or hila on this noncontrast exam. He small less than 1 cm in size in short axis nodes in the mediastinum, not pathologic by size criteria. Severely elevated left hemidiaphragm herniation in the mediastinum of several structures including portions of stomach, pancreas, small bowel and vasculature. Surgical changes near this area. Please correlate with clinical history. Lungs/Pleura: Associated bandlike opacity left lung base. Atelectasis is favored.  Mild right basilar atelectasis or scar as well. No pneumothorax. There is opacification noted of the central airways of the left lower lobe with parenchymal opacities. Also some areas dependently in lingula. Luminal debris along the bronchi. LS versus infiltrate. Aggressive process is not excluded. Please correlate with any history Upper Abdomen: Over left hemidiaphragm hernia as above. The adrenal glands are preserved. Nonobstructive left-sided renal stone. Musculoskeletal: S shaped curvature of the spine multifocal degenerative changes. IMPRESSION: Severely elevated left hemidiaphragm with department of hernia as well. Adjacent parenchymal opacity is identified in the left lower lobe and dependent portion of the lingula with some opacification along bronchi in this location. Possible debris versus lesion. Please correlate with any known history. Additional workup as clinically directed. Pacemaker.  Postop chest. Electronically Signed   By: Jill Side M.D.   On: 03/02/2022 13:13   NM Pulmonary Perfusion  Result Date: 03/02/2022 CLINICAL DATA:  Chest pain.  Shortness of breath. EXAM: NUCLEAR MEDICINE PERFUSION LUNG SCAN TECHNIQUE: Perfusion images were obtained in multiple projections after intravenous injection of radiopharmaceutical. Ventilation scans intentionally deferred if perfusion scan and chest x-ray adequate for interpretation during COVID 19 epidemic. RADIOPHARMACEUTICALS:  3.6 mCi Tc-52mMAA IV COMPARISON:  X-ray 03/01/2022.  And older FINDINGS: There is preserved perfusion of the right lung diffusely. No significant defects. X-ray demonstrates surgical changes of the left hemithorax with volume loss and elevated left hemidiaphragm. The residual left lung has relatively poor perfusion compared to the right. The distribution pattern is low suspicion for pulmonary embolism overall. However with the appearance if there is no known specific prior imaging or history dedicated anatomic imaging of the  chest with CT may be of some benefit IMPRESSION: Volume loss left hemithorax with relatively poor asymmetric perfusion of the residual left lung compared to right. Pattern is low suspicion for pulmonary embolism. However with the appearance CT imaging of the chest may be of  benefit to further delineate Electronically Signed   By: Jill Side M.D.   On: 03/02/2022 12:20   DG Chest Portable 1 View  Result Date: 03/01/2022 CLINICAL DATA:  Shortness of breath. EXAM: PORTABLE CHEST 1 VIEW COMPARISON:  09/05/2016. FINDINGS: The heart is enlarged and the mediastinal contour is stable. Atherosclerotic calcification of the aorta is noted. There is chronic elevation of the left diaphragm with atelectasis or infiltrate at the left lung base. No definite effusion or pneumothorax. No pneumothorax. Sternotomy wires are present over the midline. A single lead pacemaker device is present over the left chest. IMPRESSION: 1. Chronic elevation of the left diaphragm with atelectasis or infiltrate at the left lung base, unchanged from prior exams. 2. Cardiomegaly. Electronically Signed   By: Brett Fairy M.D.   On: 03/01/2022 21:23    EKG: Independently reviewed.  Paced rhythm at 65 bpm.  Significant baseline artifact.  Nonspecific ST changes.  QTc borderline at 470.  Assessment/Plan Active Problems:   Atrial fibrillation (HCC)   Coronary artery disease   S/P AVR   Gastroesophageal reflux disease   Essential hypertension, benign   Pacemaker   Iron deficiency anemia   Type 2 diabetes mellitus with vascular disease (HCC)   CKD (chronic kidney disease) stage 3, GFR 30-59 ml/min (HCC)   Heart block AV complete (HCC)   Acute respiratory failure with hypoxia (HCC)   Acute respiratory failure with hypoxia ?Pneumonia Weakness > Patient presenting with a week of cough, weakness, fatigue and shortness of breath.  > Initially hypoxic into the upper 80s on room air and placed on supplemental oxygen of 1 to 2 L with  improvement.  Also received 2 DuoNebs by EMS.  Has been weaning off oxygen at rest thus far.  Unclear if he continues to need more with ambulation. > Chest x-ray and CT findings show possible left lower lobe pneumonia versus other. > No leukocytosis making bacterial infection less likely however is 50 M may not be mounting a response.  Respiratory panel for flu COVID RSV negative.  Will check full respiratory viral panel and procalcitonin to further delineate for possible viral respiratory infection. > BNP is also borderline elevated has not had an echo in several years this may need to be rechecked. > Received ceftriaxone and azithromycin in the ED - Monitor on telemetry - Supplemental oxygen, continue to wean as tolerated - Hold off on further antibiotics pending procalcitonin and respiratory viral panel - Trend fever curve and WBC - Follow-up RVP - Follow-up procalcitonin - PT/OT  ?AKI on CKD 3B? Hypokalemia > Creatinine elevated to 2.24, similar to 2 weeks ago but increased from other most recent labs 5 years ago. Possible just progression of CKD. > Potassium of 3.2 in the ED. > Received 500 cc of IV fluids in the ED.  Has borderline BNP so hold off on further fluids. - 40 mill equivalents p.o. potassium - Check magnesium - Trend renal function and electrolytes  Chronic edema Elevated BNP > Noted to have chronic edema and shortness of breath as above.  With BNP borderline at 300 and history of edema on torsemide will recheck echo.  Last echo was in 2018 with EF 60-65%, indeterminate diastolic, normal RV. - Echocardiogram - Holding home Lasix pending response to IV fluids  Complete heart block > Status post pacemaker, paced rhythm in ED. - Noted  Atrial fibrillation - Continue home warfarin per pharmacy  CAD, status post CABG Status post AVR - Continue home warfarin as  above  Diabetes - SSI  DVT prophylaxis: Warfarin Code Status:   Full, discussed with patient and  family Family Communication:  Updated at bedside Disposition Plan:   Patient is from:  Home  Anticipated DC to:  Home  Anticipated DC date:  1 to 2 days  Anticipated DC barriers: None  Consults called:  None Admission status:  Observation, telemetry  Severity of Illness: The appropriate patient status for this patient is OBSERVATION. Observation status is judged to be reasonable and necessary in order to provide the required intensity of service to ensure the patient's safety. The patient's presenting symptoms, physical exam findings, and initial radiographic and laboratory data in the context of their medical condition is felt to place them at decreased risk for further clinical deterioration. Furthermore, it is anticipated that the patient will be medically stable for discharge from the hospital within 2 midnights of admission.    Marcelyn Bruins MD [facility]  How to contact the [facility] Attending or Consulting provider [place] or covering provider during after hours [place], for this patient?   Check the care team in [facility] and look for a) attending/consulting [facility] provider listed and b) the [facility] team listed Log into [email or web address] and use [place] universal password to access. If you do not have the password, please contact the hospital operator. Locate the [facility] provider you are looking for under [facility] and page to a number that you can be directly reached. If you still have difficulty reaching the provider, please page the [facility] (Director on Call) for the Hospitalists listed on amion for assistance.  03/02/2022, 2:21 PM

## 2022-03-02 NOTE — ED Notes (Signed)
Patient transferred to Vascular

## 2022-03-02 NOTE — ED Notes (Signed)
Pt transported to vascular.  °

## 2022-03-02 NOTE — ED Notes (Signed)
RN and NT assisted pt on [facility]

## 2022-03-03 DIAGNOSIS — N183 Chronic kidney disease, stage 3 unspecified: Secondary | ICD-10-CM | POA: Diagnosis not present

## 2022-03-03 DIAGNOSIS — G9341 Metabolic encephalopathy: Secondary | ICD-10-CM | POA: Diagnosis present

## 2022-03-03 DIAGNOSIS — I442 Atrioventricular block, complete: Secondary | ICD-10-CM | POA: Diagnosis present

## 2022-03-03 DIAGNOSIS — J189 Pneumonia, unspecified organism: Secondary | ICD-10-CM | POA: Diagnosis present

## 2022-03-03 DIAGNOSIS — Z1152 Encounter for screening for COVID-19: Secondary | ICD-10-CM | POA: Diagnosis not present

## 2022-03-03 DIAGNOSIS — Z888 Allergy status to other drugs, medicaments and biological substances status: Secondary | ICD-10-CM | POA: Diagnosis not present

## 2022-03-03 DIAGNOSIS — B348 Other viral infections of unspecified site: Secondary | ICD-10-CM | POA: Diagnosis present

## 2022-03-03 DIAGNOSIS — I5043 Acute on chronic combined systolic (congestive) and diastolic (congestive) heart failure: Secondary | ICD-10-CM | POA: Diagnosis not present

## 2022-03-03 DIAGNOSIS — Z952 Presence of prosthetic heart valve: Secondary | ICD-10-CM | POA: Diagnosis not present

## 2022-03-03 DIAGNOSIS — I4821 Permanent atrial fibrillation: Secondary | ICD-10-CM | POA: Diagnosis present

## 2022-03-03 DIAGNOSIS — Z885 Allergy status to narcotic agent status: Secondary | ICD-10-CM | POA: Diagnosis not present

## 2022-03-03 DIAGNOSIS — E1151 Type 2 diabetes mellitus with diabetic peripheral angiopathy without gangrene: Secondary | ICD-10-CM | POA: Diagnosis present

## 2022-03-03 DIAGNOSIS — I2510 Atherosclerotic heart disease of native coronary artery without angina pectoris: Secondary | ICD-10-CM | POA: Diagnosis present

## 2022-03-03 DIAGNOSIS — N184 Chronic kidney disease, stage 4 (severe): Secondary | ICD-10-CM | POA: Diagnosis present

## 2022-03-03 DIAGNOSIS — K219 Gastro-esophageal reflux disease without esophagitis: Secondary | ICD-10-CM | POA: Diagnosis present

## 2022-03-03 DIAGNOSIS — I130 Hypertensive heart and chronic kidney disease with heart failure and stage 1 through stage 4 chronic kidney disease, or unspecified chronic kidney disease: Secondary | ICD-10-CM | POA: Diagnosis present

## 2022-03-03 DIAGNOSIS — E1122 Type 2 diabetes mellitus with diabetic chronic kidney disease: Secondary | ICD-10-CM | POA: Diagnosis present

## 2022-03-03 DIAGNOSIS — J122 Parainfluenza virus pneumonia: Secondary | ICD-10-CM | POA: Diagnosis present

## 2022-03-03 DIAGNOSIS — I10 Essential (primary) hypertension: Secondary | ICD-10-CM | POA: Diagnosis not present

## 2022-03-03 DIAGNOSIS — Z7401 Bed confinement status: Secondary | ICD-10-CM | POA: Diagnosis not present

## 2022-03-03 DIAGNOSIS — E876 Hypokalemia: Secondary | ICD-10-CM | POA: Diagnosis present

## 2022-03-03 DIAGNOSIS — D509 Iron deficiency anemia, unspecified: Secondary | ICD-10-CM | POA: Diagnosis present

## 2022-03-03 DIAGNOSIS — E1159 Type 2 diabetes mellitus with other circulatory complications: Secondary | ICD-10-CM | POA: Diagnosis not present

## 2022-03-03 DIAGNOSIS — R531 Weakness: Secondary | ICD-10-CM | POA: Diagnosis present

## 2022-03-03 DIAGNOSIS — Z7901 Long term (current) use of anticoagulants: Secondary | ICD-10-CM | POA: Diagnosis not present

## 2022-03-03 DIAGNOSIS — N400 Enlarged prostate without lower urinary tract symptoms: Secondary | ICD-10-CM | POA: Diagnosis present

## 2022-03-03 DIAGNOSIS — I4891 Unspecified atrial fibrillation: Secondary | ICD-10-CM | POA: Diagnosis not present

## 2022-03-03 DIAGNOSIS — Z950 Presence of cardiac pacemaker: Secondary | ICD-10-CM | POA: Diagnosis not present

## 2022-03-03 DIAGNOSIS — N179 Acute kidney failure, unspecified: Secondary | ICD-10-CM | POA: Diagnosis present

## 2022-03-03 DIAGNOSIS — I2721 Secondary pulmonary arterial hypertension: Secondary | ICD-10-CM | POA: Diagnosis present

## 2022-03-03 DIAGNOSIS — Z951 Presence of aortocoronary bypass graft: Secondary | ICD-10-CM | POA: Diagnosis not present

## 2022-03-03 DIAGNOSIS — J9601 Acute respiratory failure with hypoxia: Secondary | ICD-10-CM | POA: Diagnosis present

## 2022-03-03 LAB — COMPREHENSIVE METABOLIC PANEL
ALT: 16 U/L (ref 0–44)
AST: 31 U/L (ref 15–41)
Albumin: 2.9 g/dL — ABNORMAL LOW (ref 3.5–5.0)
Alkaline Phosphatase: 50 U/L (ref 38–126)
Anion gap: 12 (ref 5–15)
BUN: 65 mg/dL — ABNORMAL HIGH (ref 8–23)
CO2: 26 mmol/L (ref 22–32)
Calcium: 8.8 mg/dL — ABNORMAL LOW (ref 8.9–10.3)
Chloride: 101 mmol/L (ref 98–111)
Creatinine, Ser: 2.39 mg/dL — ABNORMAL HIGH (ref 0.61–1.24)
GFR, Estimated: 25 mL/min — ABNORMAL LOW (ref >60)
Glucose, Bld: 243 mg/dL — ABNORMAL HIGH (ref 70–99)
Potassium: 4.5 mmol/L (ref 3.5–5.1)
Sodium: 139 mmol/L (ref 135–145)
Total Bilirubin: 0.5 mg/dL (ref 0.3–1.2)
Total Protein: 6.4 g/dL — ABNORMAL LOW (ref 6.5–8.1)

## 2022-03-03 LAB — PROCALCITONIN: Procalcitonin: 0.34 ng/mL

## 2022-03-03 LAB — CBC
HCT: 36.1 % — ABNORMAL LOW (ref 39.0–52.0)
Hemoglobin: 11.3 g/dL — ABNORMAL LOW (ref 13.0–17.0)
MCH: 27.2 pg (ref 26.0–34.0)
MCHC: 31.3 g/dL (ref 30.0–36.0)
MCV: 87 fL (ref 80.0–100.0)
Platelets: 229 10*3/uL (ref 150–400)
RBC: 4.15 MIL/uL — ABNORMAL LOW (ref 4.22–5.81)
RDW: 16.3 % — ABNORMAL HIGH (ref 11.5–15.5)
WBC: 8.7 10*3/uL (ref 4.0–10.5)
nRBC: 0 % (ref 0.0–0.2)

## 2022-03-03 LAB — GLUCOSE, CAPILLARY
Glucose-Capillary: 208 mg/dL — ABNORMAL HIGH (ref 70–99)
Glucose-Capillary: 212 mg/dL — ABNORMAL HIGH (ref 70–99)
Glucose-Capillary: 28 mg/dL — CL (ref 70–99)
Glucose-Capillary: 57 mg/dL — ABNORMAL LOW (ref 70–99)
Glucose-Capillary: 78 mg/dL (ref 70–99)
Glucose-Capillary: 108 mg/dL — ABNORMAL HIGH (ref 70–99)

## 2022-03-03 LAB — RESPIRATORY PANEL BY PCR

## 2022-03-03 LAB — PROTIME-INR
INR: 4.9 (ref 0.8–1.2)
Prothrombin Time: 45.2 seconds — ABNORMAL HIGH (ref 11.4–15.2)

## 2022-03-03 MED ORDER — SODIUM CHLORIDE 0.9 % IV SOLN
1.0000 g | INTRAVENOUS | Status: DC
  Administered 2022-03-03 – 2022-03-05 (×3): 1 g via INTRAVENOUS
  Filled 2022-03-03 (×3): qty 10

## 2022-03-03 MED ORDER — SODIUM CHLORIDE 0.9 % IV SOLN
500.0000 mg | INTRAVENOUS | Status: DC
  Administered 2022-03-03 – 2022-03-05 (×3): 500 mg via INTRAVENOUS
  Filled 2022-03-03 (×5): qty 5

## 2022-03-03 NOTE — Progress Notes (Signed)
ANTICOAGULATION CONSULT NOTE  Pharmacy Consult for warfarin  Indication: atrial fibrillation  Allergies  Allergen Reactions   Demerol [Meperidine] Other (See Comments)    Causes hallucinations   Meperidine Hcl Other (See Comments)   Nasal Spray Other (See Comments)   Simvastatin Other (See Comments)    Patient Measurements: Height: '5\' 10"'$  (177.8 cm) Weight: 77.1 kg (170 lb) IBW/kg (Calculated) : 73  Vital Signs: Temp: 98 F (36.7 C) (01/04 0235) Temp Source: Oral (01/04 0235) BP: 145/73 (01/04 0235) Pulse Rate: 62 (01/04 0235)  Labs: Recent Labs    03/01/22 2055 03/01/22 2102 03/02/22 0825 03/02/22 0829 03/03/22 0229  HGB 11.3* 12.6*  --   --  11.3*  HCT 37.6* 37.0*  --   --  36.1*  PLT 241  --   --   --  229  LABPROT  --   --   --  31.5* 45.2*  INR  --   --   --  3.1* 4.9*  CREATININE 2.24*  --  2.99*  --  2.39*  TROPONINIHS 38*  --  32*  --   --      Estimated Creatinine Clearance: 19.5 mL/min (A) (by C-G formula based on SCr of 2.39 mg/dL (H)).  Assessment: Patient admitted with SOB. Hx of afib on warfarin PTA. Regimen confirmed with daughter as 1.'25mg'$  Tuesday and Saturday, 2.'5mg'$  all other days. Daughter also reports that patient has not had his weekly leafy greens for the past 2 weeks. Last dose was on 1/2 PM.   INR significantly increased to 4.9 (supratherapeutic) without a recent dose. CBC stable with no signs of bleeding.  Goal of Therapy:  INR 2-3 Monitor platelets by anticoagulation protocol: Yes   Plan:  INR is supratherapeutic, will hold dose today to allow downward trend.  Daily INR checks.   Erskine Speed, PharmD Clinical Pharmacist 03/03/2022,7:23 AM

## 2022-03-03 NOTE — Progress Notes (Signed)
PROGRESS NOTE  [name] TIW:[id number] DOB: Dec 22, 1925 DOA: 03/01/2022 PCP: Wenda Low, MD   LOS: 0 days   Brief Narrative / Interim history: 89 year old male with DM2, history of AVR on Coumadin, CHB status post PPM, A-fib, CAD, CKD 3, CHF comes into the hospital with shortness of breath, weakness associated with cough and fatigue.  This has been going on for the past week, along with decreased p.o. intake as well as pleuritic type chest pain with deep inspiration.  VQ scan in the ED was low probability for PE, and a CT scan of the chest was concerning for lingular infiltrates/debris.  He was placed on antibiotics and admitted to the hospital.  Subjective / 24h Interval events: Appears mildly confused this morning, thinks he is at home.  Denies any complaints for me  Assesement and Plan: Active Problems:   Atrial fibrillation (HCC)   Coronary artery disease   S/P AVR   Gastroesophageal reflux disease   Essential hypertension, benign   Pacemaker   Iron deficiency anemia   Type 2 diabetes mellitus with vascular disease (HCC)   CKD (chronic kidney disease) stage 3, GFR 30-59 ml/min (HCC)   Heart block AV complete (HCC)   Acute respiratory failure with hypoxia (HCC)   Weakness   Principal problem Acute hypoxic respiratory failure due to community-acquired pneumonia -he has been placed on antibiotics, continue.  COVID and flu are negative but RVP is pending.  Wean off to room air as tolerated.  Procalcitonin borderline elevated 0.34 -PT eval pending given associated weakness  Active problems AKI on CKD 4 - creatinine 2.16 in December 2023 which I suspect is his baseline.  Creatinine was up to 2.99 on admission, improved to 2.4 this morning.  Continue to monitor, avoid nephrotoxins  Acute metabolic encephalopathy-no reported history of dementia, likely in the setting of #1.  Confused this morning.  I am unable to reach family to assess his baseline, tried calling his son  and daughter  Hypokalemia - repleted, continue to monitor, now normalized this morning  Chronic edema, elevated BNP - Noted to have chronic edema and shortness of breath as above.  With BNP borderline at 300 and history of edema on torsemide will recheck echo.  Last echo was in 2018 with EF 60-65%, indeterminate diastolic, normal RV. - Holding home Lasix for now   Complete heart block - Status post pacemaker, paced rhythm in ED. scheduled to have battery replaced in a month  Atrial fibrillation - Continue home warfarin per pharmacy   CAD, status post CABG, Status post AVR - Continue home warfarin as above   Diabetes - SSI  CBG (last 3)  Recent Labs    03/02/22 1653 03/03/22 0954  GLUCAP 350* 208*    Scheduled Meds:  famotidine  20 mg Oral Daily   insulin aspart  0-9 Units Subcutaneous TID WC   sodium chloride flush  3 mL Intravenous Q12H   tamsulosin  0.4 mg Oral Daily   Warfarin - Pharmacist Dosing Inpatient   Does not apply q1600   Continuous Infusions:  azithromycin     cefTRIAXone (ROCEPHIN)  IV     PRN Meds:.acetaminophen **OR** acetaminophen, albuterol, polyethylene glycol  Current Outpatient Medications  Medication Instructions   acetaminophen (TYLENOL) 325-650 mg, Oral, Every 6 hours PRN   amoxicillin (AMOXIL) 500 MG tablet Take 4 tablets by mouth 30-60 minutes prior to dental appointments   Coenzyme Q10 (CO Q-10) 100 MG CAPS 1 capsule, Oral, Daily   Elastic  Bandages & Supports (V-2 HIGH COMPRESSION HOSE) MISC Daily   famotidine (PEPCID) 20 mg, Oral, Daily   glimepiride (AMARYL) 1 mg, Oral, 2 times daily   ketoconazole (NIZORAL) 2 % cream 1 application to affected area   nitroGLYCERIN (NITROSTAT) 0.4 mg, Sublingual, Every 5 min x3 PRN   tamsulosin (FLOMAX) 0.4 mg, Oral, Daily   torsemide (DEMADEX) 40 mg, Oral, 2 times daily   warfarin (COUMADIN) 2.5 MG tablet TAKE 1/2 A TABLET TO 1 TABLET BY MOUTH DAILY AS DIRECTED BY THE COUMADIN CLINIC.    Diet Orders  (From admission, onward)     Start     Ordered   03/02/22 1403  Diet heart healthy/carb modified Room service appropriate? Yes; Fluid consistency: Thin; Fluid restriction: 2000 mL Fluid  Diet effective now       Question Answer Comment  Diet-HS Snack? Nothing   Room service appropriate? Yes   Fluid consistency: Thin   Fluid restriction: 2000 mL Fluid      03/02/22 1421            DVT prophylaxis:    Lab Results  Component Value Date   PLT 229 03/03/2022      Code Status: Full Code  Family Communication: no family at bedside   Status is: Observation The patient will require care spanning > 2 midnights and should be moved to inpatient because: Confusion, persistent shortness of breath   Level of care: Telemetry Medical  Consultants:  none  Objective: Vitals:   03/02/22 1959 03/02/22 2000 03/02/22 2241 03/03/22 0235  BP:  (!) 100/49 124/72 (!) 145/73  Pulse:  64 61 62  Resp:  (!) '27 17 18  '$ Temp:   98.2 F (36.8 C) 98 F (36.7 C)  TempSrc:   Oral Oral  SpO2:  97% 99% 95%  Weight: 77.1 kg     Height: '5\' 10"'$  (1.778 m)      No intake or output data in the 24 hours ending 03/03/22 1040 Wt Readings from Last 3 Encounters:  03/02/22 77.1 kg  01/04/22 74 kg  11/12/21 74.8 kg    Examination:  Constitutional: NAD Eyes: no scleral icterus ENMT: Mucous membranes are moist.  Neck: normal, supple Respiratory: Tachypneic, objectively appears with increased respiratory effort, no wheezing, no crackles Cardiovascular: Regular rate and rhythm, no murmurs / rubs / gallops.  Trace LE edema.  Abdomen: non distended, no tenderness. Bowel sounds positive.  Musculoskeletal: no clubbing / cyanosis.    Data Reviewed: I have independently reviewed following labs and imaging studies   CBC Recent Labs  Lab 03/01/22 2055 03/01/22 2102 03/03/22 0229  WBC 9.0  --  8.7  HGB 11.3* 12.6* 11.3*  HCT 37.6* 37.0* 36.1*  PLT 241  --  229  MCV 88.7  --  87.0  MCH 26.7  --   27.2  MCHC 30.1  --  31.3  RDW 16.3*  --  16.3*  LYMPHSABS 1.0  --   --   MONOABS 0.5  --   --   EOSABS 0.0  --   --   BASOSABS 0.0  --   --     Recent Labs  Lab 03/01/22 2055 03/01/22 2102 03/02/22 0825 03/02/22 0829 03/03/22 0229  NA 138 139 137  --  139  K 3.2* 3.3* 4.0  --  4.5  CL 96*  --  94*  --  101  CO2 27  --  21*  --  26  GLUCOSE 165*  --  383*  --  243*  BUN 43*  --  55*  --  65*  CREATININE 2.24*  --  2.99*  --  2.39*  CALCIUM 8.6*  --  8.6*  --  8.8*  AST  --   --   --   --  31  ALT  --   --   --   --  16  ALKPHOS  --   --   --   --  50  BILITOT  --   --   --   --  0.5  ALBUMIN  --   --   --   --  2.9*  MG  --   --  2.3  --   --   DDIMER  --   --   --  1.86*  --   PROCALCITON  --   --  0.36  --  0.34  INR  --   --   --  3.1* 4.9*  BNP 308.0*  --   --   --   --     ------------------------------------------------------------------------------------------------------------------ No results for input(s): "CHOL", "HDL", "LDLCALC", "TRIG", "CHOLHDL", "LDLDIRECT" in the last 72 hours.  Lab Results  Component Value Date   HGBA1C 7.7 (H) 09/03/2016   ------------------------------------------------------------------------------------------------------------------ No results for input(s): "TSH", "T4TOTAL", "T3FREE", "THYROIDAB" in the last 72 hours.  Invalid input(s): "FREET3"  Cardiac Enzymes No results for input(s): "CKMB", "TROPONINI", "MYOGLOBIN" in the last 168 hours.  Invalid input(s): "CK" ------------------------------------------------------------------------------------------------------------------    Component Value Date/Time   BNP 308.0 (H) 03/01/2022 2055    CBG: Recent Labs  Lab 03/02/22 1653 03/03/22 0954  GLUCAP 350* 208*    Recent Results (from the past 240 hour(s))  Resp panel by RT-PCR (RSV, Flu A&B, Covid) Anterior Nasal Swab     Status: None   Collection Time: 03/01/22  8:55 PM   Specimen: Anterior Nasal Swab  Result  Value Ref Range Status   SARS Coronavirus 2 by RT PCR NEGATIVE NEGATIVE Final    Comment: (NOTE) SARS-CoV-2 target nucleic acids are NOT DETECTED.  The SARS-CoV-2 RNA is generally detectable in upper respiratory specimens during the acute phase of infection. The lowest concentration of SARS-CoV-2 viral copies this assay can detect is 138 copies/mL. A negative result does not preclude SARS-Cov-2 infection and should not be used as the sole basis for treatment or other patient management decisions. A negative result may occur with  improper specimen collection/handling, submission of specimen other than nasopharyngeal swab, presence of viral mutation(s) within the areas targeted by this assay, and inadequate number of viral copies(<138 copies/mL). A negative result must be combined with clinical observations, patient history, and epidemiological information. The expected result is Negative.  Fact Sheet for Patients:  EntrepreneurPulse.com.au  Fact Sheet for Healthcare Providers:  IncredibleEmployment.be  This test is no t yet approved or cleared by the [place] FDA and  has been authorized for detection and/or diagnosis of SARS-CoV-2 by FDA under an Emergency Use Authorization (EUA). This EUA will remain  in effect (meaning this test can be used) for the duration of the COVID-19 declaration under Section 564(b)(1) of the Act, 21 U.S.C.section 360bbb-3(b)(1), unless the authorization is terminated  or revoked sooner.       Influenza A by PCR NEGATIVE NEGATIVE Final   Influenza B by PCR NEGATIVE NEGATIVE Final    Comment: (NOTE) The Xpert Xpress SARS-CoV-2/FLU/RSV plus assay is intended as an aid in the diagnosis of influenza from Nasopharyngeal swab specimens  and should not be used as a sole basis for treatment. Nasal washings and aspirates are unacceptable for Xpert Xpress SARS-CoV-2/FLU/RSV testing.  Fact Sheet for  Patients: EntrepreneurPulse.com.au  Fact Sheet for Healthcare Providers: IncredibleEmployment.be  This test is not yet approved or cleared by the [place] FDA and has been authorized for detection and/or diagnosis of SARS-CoV-2 by FDA under an Emergency Use Authorization (EUA). This EUA will remain in effect (meaning this test can be used) for the duration of the COVID-19 declaration under Section 564(b)(1) of the Act, 21 U.S.C. section 360bbb-3(b)(1), unless the authorization is terminated or revoked.     Resp Syncytial Virus by PCR NEGATIVE NEGATIVE Final    Comment: (NOTE) Fact Sheet for Patients: EntrepreneurPulse.com.au  Fact Sheet for Healthcare Providers: IncredibleEmployment.be  This test is not yet approved or cleared by the [place] FDA and has been authorized for detection and/or diagnosis of SARS-CoV-2 by FDA under an Emergency Use Authorization (EUA). This EUA will remain in effect (meaning this test can be used) for the duration of the COVID-19 declaration under Section 564(b)(1) of the Act, 21 U.S.C. section 360bbb-3(b)(1), unless the authorization is terminated or revoked.  Performed at [facility], [place] [address] [place], [place] [address]      Radiology Studies: VAS [place] LOWER EXTREMITY VENOUS (DVT) (ONLY [facility] & [facility])  Result Date: 03/02/2022  Lower Venous DVT Study Patient Name:  INA POUPARD Teschner  Date of Exam:   03/02/2022 Medical Rec #: [id number]            Accession #:    [id number] Date of Birth: 13-Nov-1925           Patient Gender: M Patient Age:   8 years Exam Location:  [facility] Procedure:      VAS [place] LOWER EXTREMITY VENOUS (DVT) Referring Phys: BRITNI HENDERLY --------------------------------------------------------------------------------  Indications: Left leg edema, erythema.  Risk Factors: CHF. Comparison Study: No prior studies. Performing Technologist:  Darlin Coco RDMS, RVT  Examination Guidelines: A complete evaluation includes B-mode imaging, spectral Doppler, color Doppler, and power Doppler as needed of all accessible portions of each vessel. Bilateral testing is considered an integral part of a complete examination. Limited examinations for reoccurring indications may be performed as noted. The reflux portion of the exam is performed with the patient in reverse Trendelenburg.  +-----+---------------+---------+-----------+----------+--------------+ RIGHTCompressibilityPhasicitySpontaneityPropertiesThrombus Aging +-----+---------------+---------+-----------+----------+--------------+ CFV  Full           No       Yes                                 +-----+---------------+---------+-----------+----------+--------------+   +---------+---------------+---------+-----------+----------+--------------+ LEFT     CompressibilityPhasicitySpontaneityPropertiesThrombus Aging +---------+---------------+---------+-----------+----------+--------------+ CFV      Full           No       Yes                                 +---------+---------------+---------+-----------+----------+--------------+ SFJ      Full                                                        +---------+---------------+---------+-----------+----------+--------------+ FV Prox  Full                                                        +---------+---------------+---------+-----------+----------+--------------+  FV Mid   Full                                                        +---------+---------------+---------+-----------+----------+--------------+ FV DistalFull                                                        +---------+---------------+---------+-----------+----------+--------------+ PFV      Full                                                        +---------+---------------+---------+-----------+----------+--------------+ POP       Full           No       Yes                                 +---------+---------------+---------+-----------+----------+--------------+ PTV      Full                                                        +---------+---------------+---------+-----------+----------+--------------+ PERO     Full                                                        +---------+---------------+---------+-----------+----------+--------------+    Summary: RIGHT: - No evidence of common femoral vein obstruction.  LEFT: - There is no evidence of deep vein thrombosis in the lower extremity.  - No cystic structure found in the popliteal fossa.  -Pulsatile venous waveforms noted, consistent with elevated right heart pressures. *See table(s) above for measurements and observations. Electronically signed by Orlie Pollen on 03/02/2022 at 8:19:11 PM.    Final    DG Foot 2 Views Left  Result Date: 03/02/2022 CLINICAL DATA:  Left foot pain. EXAM: LEFT FOOT - 2 VIEW COMPARISON:  None Available. FINDINGS: No acute fracture or dislocation is identified. An accessory navicular is noted. Prominent soft tissue swelling is present diffusely along the dorsum of the foot extending across the ankle and into the lower leg. There are widespread atherosclerotic vascular calcifications. IMPRESSION: Soft tissue swelling without acute osseous abnormality identified. Electronically Signed   By: Logan Bores M.D.   On: 03/02/2022 16:18   CT Chest Wo Contrast  Result Date: 03/02/2022 CLINICAL DATA:  Shortness of breath, fever and wheezing. EXAM: CT CHEST WITHOUT CONTRAST TECHNIQUE: Multidetector CT imaging of the chest was performed following the standard protocol without IV contrast. RADIATION DOSE REDUCTION: This exam was performed according to the departmental dose-optimization program which includes automated exposure control, adjustment of the mA and/or kV according to patient size and/or use of iterative  reconstruction technique.  COMPARISON:  X-ray 03/01/2022.  V/Q scan 03/02/2022 FINDINGS: Cardiovascular: Heart is nonenlarged. Prominent coronary artery calcifications there is also prominent calcifications along the course of the normal caliber thoracic aorta. The aorta has some tortuous course. No significant pericardial effusion. Left upper chest battery pack with leads extending towards the right side of the heart. Pacemaker. Status post median sternotomy. Mediastinum/Nodes: No specific abnormal lymph node enlargement identified in the axillary region or hila on this noncontrast exam. He small less than 1 cm in size in short axis nodes in the mediastinum, not pathologic by size criteria. Severely elevated left hemidiaphragm herniation in the mediastinum of several structures including portions of stomach, pancreas, small bowel and vasculature. Surgical changes near this area. Please correlate with clinical history. Lungs/Pleura: Associated bandlike opacity left lung base. Atelectasis is favored. Mild right basilar atelectasis or scar as well. No pneumothorax. There is opacification noted of the central airways of the left lower lobe with parenchymal opacities. Also some areas dependently in lingula. Luminal debris along the bronchi. LS versus infiltrate. Aggressive process is not excluded. Please correlate with any history Upper Abdomen: Over left hemidiaphragm hernia as above. The adrenal glands are preserved. Nonobstructive left-sided renal stone. Musculoskeletal: S shaped curvature of the spine multifocal degenerative changes. IMPRESSION: Severely elevated left hemidiaphragm with department of hernia as well. Adjacent parenchymal opacity is identified in the left lower lobe and dependent portion of the lingula with some opacification along bronchi in this location. Possible debris versus lesion. Please correlate with any known history. Additional workup as clinically directed. Pacemaker.  Postop chest. Electronically Signed   By: Jill Side M.D.   On: 03/02/2022 13:13   NM Pulmonary Perfusion  Result Date: 03/02/2022 CLINICAL DATA:  Chest pain.  Shortness of breath. EXAM: NUCLEAR MEDICINE PERFUSION LUNG SCAN TECHNIQUE: Perfusion images were obtained in multiple projections after intravenous injection of radiopharmaceutical. Ventilation scans intentionally deferred if perfusion scan and chest x-ray adequate for interpretation during COVID 19 epidemic. RADIOPHARMACEUTICALS:  3.6 mCi Tc-32mMAA IV COMPARISON:  X-ray 03/01/2022.  And older FINDINGS: There is preserved perfusion of the right lung diffusely. No significant defects. X-ray demonstrates surgical changes of the left hemithorax with volume loss and elevated left hemidiaphragm. The residual left lung has relatively poor perfusion compared to the right. The distribution pattern is low suspicion for pulmonary embolism overall. However with the appearance if there is no known specific prior imaging or history dedicated anatomic imaging of the chest with CT may be of some benefit IMPRESSION: Volume loss left hemithorax with relatively poor asymmetric perfusion of the residual left lung compared to right. Pattern is low suspicion for pulmonary embolism. However with the appearance CT imaging of the chest may be of benefit to further delineate Electronically Signed   By: AJill SideM.D.   On: 03/02/2022 12:20     CMarzetta Board MD, PhD [facility]  Between 7 am - 7 pm I am available, please contact me via Amion (for emergencies) or Securechat (non urgent messages)  Between 7 pm - 7 am I am not available, please contact night coverage MD/APP via Amion

## 2022-03-03 NOTE — NC FL2 (Signed)
[place] LEVEL OF CARE FORM     IDENTIFICATION  Patient Name: [name] Birthdate: 06/10/1925 Sex: male Admission Date (Current Location): 03/01/2022  [facility] and [place] Number:  Herbalist and Address:  [facility] [place], [place] [address] [place], [place] [address]      Provider Number: [id number]  Attending Physician Name and Address:  Caren Griffins, MD  Relative Name and Phone Number:  Pruitt Taboada [phone]    Current Level of Care: Hospital Recommended Level of Care: [place] Prior Approval Number:    Date Approved/Denied:   PASRR Number: [id number]  Discharge Plan: SNF    Current Diagnoses: Patient Active Problem List   Diagnosis Date Noted   CAP (community acquired pneumonia) 03/03/2022   Acute respiratory failure with hypoxia ([place]) 03/02/2022   Weakness 03/02/2022   Heart block AV complete ([place]) 01/04/2022   Pain due to onychomycosis of toenails of both feet 08/13/2018   Acute on chronic combined systolic and diastolic CHF (congestive heart failure) ([place]) 12/19/2016   Tick bite 09/02/2016   Cellulitis 09/02/2016   Staphylococcus aureus bacteremia 01/26/2015   FTT (failure to thrive) in adult    Hip pain    Groin pain    Fever, unspecified 12/23/2014   Type 2 diabetes mellitus with vascular disease ([place]) 12/23/2014   CKD (chronic kidney disease) stage 3, GFR 30-59 ml/min ([place]) 12/23/2014   Bilateral hip pain    Pulmonary edema    Hypotension, unspecified 10/02/2013   Iron deficiency anemia 10/02/2013   Warfarin-induced coagulopathy ([place]) 10/02/2013   Essential hypertension, benign 08/06/2013   Pacemaker 08/06/2013   Edema 08/06/2013   Encounter for therapeutic drug monitoring 03/26/2013   Dermatophytosis of nail    Gastroesophageal reflux disease    Symptomatic bradycardia 10/04/2012   Atrial fibrillation (HCC)    Coronary artery disease    S/P AVR     Orientation  RESPIRATION BLADDER Height & Weight     Self  O2 (3L [place]) Continent Weight: 77.1 kg Height:  '5\' 10"'$  (177.8 cm)  BEHAVIORAL SYMPTOMS/MOOD NEUROLOGICAL BOWEL NUTRITION STATUS      Continent Diet  AMBULATORY STATUS COMMUNICATION OF NEEDS Skin   Limited Assist Verbally Normal                       Personal Care Assistance Level of Assistance  Bathing, Feeding, Dressing Bathing Assistance: Limited assistance Feeding assistance: Limited assistance Dressing Assistance: Limited assistance     Functional Limitations Info  Sight, Hearing Sight Info: Adequate Hearing Info: Adequate      SPECIAL CARE FACTORS FREQUENCY  PT (By licensed PT), OT (By licensed OT)     PT Frequency: five times a week OT Frequency: five times a week            Contractures Contractures Info: Not present    Additional Factors Info  Code Status (full code) Code Status Info: full code             Current Medications (03/03/2022):  This is the current hospital active medication list Current Facility-Administered Medications  Medication Dose Route Frequency Provider Last Rate Last Admin   acetaminophen (TYLENOL) tablet 650 mg  650 mg Oral Q6H PRN Marcelyn Bruins, MD       Or   acetaminophen (TYLENOL) suppository 650 mg  650 mg Rectal Q6H PRN Marcelyn Bruins, MD       albuterol (  PROVENTIL) (2.5 MG/3ML) 0.083% nebulizer solution 2.5 mg  2.5 mg Nebulization Q2H PRN Marcelyn Bruins, MD       azithromycin [facility]) 500 mg in sodium chloride 0.9 % 250 mL IVPB  500 mg Intravenous Q24H Caren Griffins, MD 250 mL/hr at 03/03/22 1235 500 mg at 03/03/22 1235   cefTRIAXone (ROCEPHIN) 1 g in sodium chloride 0.9 % 100 mL IVPB  1 g Intravenous Q24H Caren Griffins, MD 200 mL/hr at 03/03/22 1149 1 g at 03/03/22 1149   famotidine (PEPCID) tablet 20 mg  20 mg Oral Daily Marcelyn Bruins, MD   20 mg at 03/03/22 0946   insulin aspart (novoLOG) injection 0-9 Units  0-9 Units Subcutaneous TID WC  Marcelyn Bruins, MD   3 Units at 03/03/22 1232   polyethylene glycol (MIRALAX / GLYCOLAX) packet 17 g  17 g Oral Daily PRN Marcelyn Bruins, MD       sodium chloride flush (NS) 0.9 % injection 3 mL  3 mL Intravenous Q12H Marcelyn Bruins, MD   3 mL at 03/03/22 0946   tamsulosin (FLOMAX) capsule 0.4 mg  0.4 mg Oral Daily Marcelyn Bruins, MD   0.4 mg at 03/03/22 6144   Warfarin - Pharmacist Dosing Inpatient   Does not apply q1600 [place] Sellers, [facility]         Discharge Medications: Please see discharge summary for a list of discharge medications.  Relevant Imaging Results:  Relevant Lab Results:   Additional Information DM2 , sliding scale Novolog insulin TID with meals, [phone]  [name], Fred Snowball, RN

## 2022-03-03 NOTE — Progress Notes (Signed)
RVP swab sent to lab.

## 2022-03-03 NOTE — Evaluation (Signed)
Occupational Therapy Evaluation Patient Details Name: [name] MRN: [id number] DOB: Jan 01, 1926 Today's Date: 03/03/2022   History of Present Illness [name] is a 89 y.o. male with medical history significant of diabetes, bradycardia, anemia, GERD, status post AVR, status post pacemaker, complete heart block, hypertension, atrial fibrillation, CAD, CKD 3, CHF presenting with shortness of breath and weakness with associated cough and fatigue.   Clinical Impression   Pt in bed upon therapy arrival appearing as if he was attempting to get out of bed. Pt verbalized that he needed to urinate and when re-orientated that he had an external catheter on he was able to urinate. Remained slightly restless throughout session and impulsive. Unsure if pt was providing accurate background information as no family were present to confirm. Pt states that he lives alone and is alone at night. Pt reports that he was Mod I with all BADL using a RW for mobility and a shower chair when bathing. Currently, pt requires mod a to power up to stand and min a for functional transfer with RW. Pt presents with posterior bias which causes him to be at a high risk for falls. Pt not oriented to place and was unable to retain information when therapist tried to re-orientate pt throughout session. At this time, due to high risk of falls, pt is not safe to return home alone. If he does not have adequate assistance at home will recommend SNF prior to discharge to focus on decreased balance and strength.      Recommendations for follow up therapy are one component of a multi-disciplinary discharge planning process, led by the attending physician.  Recommendations may be updated based on patient status, additional functional criteria and insurance authorization.   Follow Up Recommendations  Skilled nursing-short term rehab (<3 hours/day)     Assistance Recommended at Discharge Frequent or constant  Supervision/Assistance  Patient can return home with the following A little help with walking and/or transfers;A little help with bathing/dressing/bathroom;Help with stairs or ramp for entrance;Assist for transportation;Assistance with cooking/housework;Direct supervision/assist for financial management;Direct supervision/assist for medications management    Functional Status Assessment  Patient has had a recent decline in their functional status and demonstrates the ability to make significant improvements in function in a reasonable and predictable amount of time.  Equipment Recommendations  Other (comment) (TBD)       Precautions / Restrictions Precautions Precautions: Fall Precaution Comments: posterior bias, decreased orientation Restrictions Weight Bearing Restrictions: No      Mobility Bed Mobility Overal bed mobility: Needs Assistance Bed Mobility: Supine to Sit, Sit to Supine     Supine to sit: Mod assist, HOB elevated Sit to supine: Min assist   General bed mobility comments: assist with BLE to put back into bed Patient Response: Restless, Impulsive, Cooperative  Transfers Overall transfer level: Needs assistance Equipment used: Rolling walker (2 wheels) Transfers: Sit to/from Stand, Bed to chair/wheelchair/BSC Sit to Stand: Mod assist     Step pivot transfers: Min assist     General transfer comment: VC for hand placement on bed and RW prior to sit<>stand. Additional tactile cues were provided for carry over.      Balance Overall balance assessment: History of Falls, Needs assistance Sitting-balance support: Feet supported, Bilateral upper extremity supported Sitting balance-Leahy Scale: Fair Sitting balance - Comments: sitting on EOB   Standing balance support: Bilateral upper extremity supported, During functional activity, Reliant on assistive device for balance Standing balance-Leahy Scale: Poor Standing balance comment: posterior bias  requiring  increased physical assist to maintain standing balance while transitioning from bed to recliner.       ADL either performed or assessed with clinical judgement   ADL Overall ADL's : Needs assistance/impaired Eating/Feeding: Set up;Bed level   Grooming: Wash/dry hands;Wash/dry face;Sitting;Minimal assistance;Cueing for sequencing   Upper Body Bathing: Set up;Sitting;Cueing for sequencing   Lower Body Bathing: Maximal assistance;Cueing for sequencing;Sit to/from stand   Upper Body Dressing : Minimal assistance;Sitting;Cueing for sequencing   Lower Body Dressing: Moderate assistance;Cueing for sequencing;Cueing for safety;Sit to/from stand   Toilet Transfer: Minimal assistance;Rolling walker (2 wheels);Stand-pivot   Toileting- Water quality scientist and Hygiene: Total assistance;Sit to/from stand         Vision Baseline Vision/History: 0 No visual deficits Ability to See in Adequate Light: 1 Impaired Patient Visual Report: No change from baseline Additional Comments: Not tested during evaluation formally. Pt was able to verbalize that he could see the TV and calendar on opposite wall from bed.            Pertinent Vitals/Pain Pain Assessment Pain Assessment: Faces Faces Pain Scale: Hurts little more Pain Location: left ankle when touched Pain Descriptors / Indicators: Grimacing, Guarding Pain Intervention(s): Limited activity within patient's tolerance, Monitored during session     Hand Dominance Right   Extremity/Trunk Assessment Upper Extremity Assessment Upper Extremity Assessment: Generalized weakness   Lower Extremity Assessment Lower Extremity Assessment: Defer to PT evaluation   Cervical / Trunk Assessment Cervical / Trunk Assessment: Kyphotic   Communication Communication Communication: HOH   Cognition Arousal/Alertness: Awake/alert Behavior During Therapy: Restless, Impulsive Overall Cognitive Status: No family/caregiver present to determine baseline  cognitive functioning       General Comments: Pt oriented to self. Not oriented to place. Provided several cues to re-orient pt throughout session with limited carry over     General Comments  SpO2 monitored during session. Oxygen off upon therapy arrival. On room air pt remained in low 90's. After transferring to/from bed to chair, pt was then reading at 86% and supplemental oxygen was placed back on. Nursing made aware.            Home Living Family/patient expects to be discharged to:: Private residence Living Arrangements: Alone Available Help at Discharge: Family;Available 24 hours/day (Unsure if this information is accurate. Pt not clear about available assistance) Type of Home: House Home Access: Stairs to enter [employer] of Steps: back door: 5; front door: 6. Pt reports using the back door Entrance Stairs-Rails: Right Home Layout: One level     Bathroom Shower/Tub: Occupational psychologist: Standard     Home Equipment: BSC/3in1;Shower seat;Rolling Environmental consultant (2 wheels)   Additional Comments: Unsure on accuracy of background information provided by patient. No family in room to confirm.      Prior Functioning/Environment Prior Level of Function : Patient poor historian/Family not available          OT Problem List: Decreased strength;Decreased knowledge of use of DME or AE;Decreased coordination;Decreased activity tolerance;Decreased cognition;Decreased safety awareness;Impaired balance (sitting and/or standing)      OT Treatment/Interventions: Self-care/ADL training;Balance training;Therapeutic exercise;Neuromuscular education;Therapeutic activities;DME and/or AE instruction;Manual therapy;Patient/family education;Energy conservation    OT Goals(Current goals can be found in the care plan section) Acute Rehab OT Goals Patient Stated Goal: to go home OT Goal Formulation: Patient unable to participate in goal setting Time For Goal Achievement:  03/17/22 Potential to Achieve Goals: Good  OT Frequency: Min 2X/week       AM-PAC  OT "6 Clicks" Daily Activity     Outcome Measure Help from another person eating meals?: None Help from another person taking care of personal grooming?: A Little Help from another person toileting, which includes using toliet, bedpan, or urinal?: Total Help from another person bathing (including washing, rinsing, drying)?: A Little Help from another person to put on and taking off regular upper body clothing?: A Little Help from another person to put on and taking off regular lower body clothing?: A Little 6 Click Score: 17   End of Session Equipment Utilized During Treatment: Gait belt;Rolling walker (2 wheels);Oxygen Nurse Communication: Mobility status  Activity Tolerance: Patient tolerated treatment well Patient left: in bed;with call bell/phone within reach;with bed alarm set;with nursing/sitter in room  OT Visit Diagnosis: Unsteadiness on feet (R26.81);Repeated falls (R29.6);Muscle weakness (generalized) (M62.81)                Time: 9983-3825 OT Time Calculation (min): 36 min Charges:  OT General Charges $OT Visit: 1 Visit OT Evaluation $OT Eval Moderate Complexity: 1 Mod  [employer], OTR/L,CBIS  Supplemental OT - [facility] and [facility] Secure Chat Preferred    Marquez Ceesay, Clarene Duke 03/03/2022, 10:26 AM

## 2022-03-03 NOTE — Evaluation (Signed)
Physical Therapy Evaluation  Patient Details Name: [name] MRN: [id number] DOB: March 14, 1925 Today's Date: 03/03/2022  History of Present Illness  Pt is a 89 y.o. male who presents 03/01/22 with shortness of breath and weakness with associated cough and fatigue. PMH significant for diabetes, bradycardia, anemia, status post AVR, status post pacemaker, complete heart block, hypertension, atrial fibrillation, CAD, CKD 3, CHF   Clinical Impression  Pt admitted with above diagnosis. At the time of PT eval, pt confused, thinking he was at home or needed to leave to go home in that moment.  Pt on RA throughout session and O2 sats 91% after OOB mobility. Pt is a poor historian and unable to provide a clear picture of what assistance he has/could have at home. Recommending SNF level rehab at d/c based on performance today and assumption that pt will be alone during the day as he stated. If family can provide supervision and minimal physical assistance at d/c, anticipate he will be able to manage at home with continued therapy services while admitted. Pt currently with functional limitations due to the deficits listed below (see PT Problem List). Pt will benefit from skilled PT to increase their independence and safety with mobility to allow discharge to the venue listed below.          Recommendations for follow up therapy are one component of a multi-disciplinary discharge planning process, led by the attending physician.  Recommendations may be updated based on patient status, additional functional criteria and insurance authorization.  Follow Up Recommendations Skilled nursing-short term rehab (<3 hours/day) Can patient physically be transported by private vehicle: Yes    Assistance Recommended at Discharge Frequent or constant Supervision/Assistance  Patient can return home with the following  A little help with walking and/or transfers;A little help with bathing/dressing/bathroom;Assistance  with cooking/housework;Help with stairs or ramp for entrance;Assist for transportation    Equipment Recommendations None recommended by PT (If above information is correct)  Recommendations for Other Services       Functional Status Assessment Patient has had a recent decline in their functional status and demonstrates the ability to make significant improvements in function in a reasonable and predictable amount of time.     Precautions / Restrictions Precautions Precautions: Fall Precaution Comments: posterior bias, decreased orientation Restrictions Weight Bearing Restrictions: No      Mobility  Bed Mobility Overal bed mobility: Needs Assistance Bed Mobility: Supine to Sit, Sit to Supine     Supine to sit: Min assist Sit to supine: Min assist   General bed mobility comments: assist with BLE to put back into bed    Transfers Overall transfer level: Needs assistance Equipment used: Rolling walker (2 wheels) Transfers: Sit to/from Stand, Bed to chair/wheelchair/BSC Sit to Stand: Mod assist           General transfer comment: VC's for hand placement on seated surface for safety. Pt required mod assist for power up to full stand. Increased time to correct posterior lean and gain standing balance.    Ambulation/Gait Ambulation/Gait assistance: Min guard Gait Distance (Feet): 10 Feet Assistive device: Rolling walker (2 wheels) Gait Pattern/deviations: Step-through pattern, Decreased stride length, Decreased dorsiflexion - left Gait velocity: Decreased Gait velocity interpretation: <1.31 ft/sec, indicative of household ambulator   General Gait Details: Significant foot drop on the L but pt correcting for it fairly well with hip hike to compensate.  Stairs            Emergency planning/management officer  Modified Rankin (Stroke Patients Only)       Balance Overall balance assessment: History of Falls, Needs assistance Sitting-balance support: Feet supported, Bilateral  upper extremity supported Sitting balance-Leahy Scale: Fair Sitting balance - Comments: sitting on EOB   Standing balance support: Bilateral upper extremity supported, During functional activity, Reliant on assistive device for balance Standing balance-Leahy Scale: Poor                               Pertinent Vitals/Pain Pain Assessment Pain Assessment: Faces Faces Pain Scale: No hurt Pain Intervention(s): Limited activity within patient's tolerance, Monitored during session, Repositioned    Home Living Family/patient expects to be discharged to:: Private residence Living Arrangements: Alone Available Help at Discharge: Family;Available PRN/intermittently Type of Home: House Home Access: Stairs to enter Entrance Stairs-Rails: Right Entrance Stairs-Number of Steps: back door: 5; front door: 6. Pt reports using the back door   Home Layout: One level Home Equipment: BSC/3in1;Shower seat;Rolling Walker (2 wheels) Additional Comments: Unsure on accuracy of background information provided by patient. No family in room to confirm.    Prior Function Prior Level of Function : Patient poor historian/Family not available                     Hand Dominance   Dominant Hand: Right    Extremity/Trunk Assessment   Upper Extremity Assessment Upper Extremity Assessment: Defer to OT evaluation    Lower Extremity Assessment Lower Extremity Assessment: Generalized weakness;LLE deficits/detail LLE Deficits / Details: Foot drop. Pt reports having an AFO at home but not present at the hospital.    Cervical / Trunk Assessment Cervical / Trunk Assessment: Kyphotic  Communication   Communication: HOH  Cognition Arousal/Alertness: Awake/alert Behavior During Therapy: Restless, Impulsive Overall Cognitive Status: No family/caregiver present to determine baseline cognitive functioning                                 General Comments: Pt oriented to self. Not  oriented to place. Provided several cues to re-orient pt throughout session with limited carry over        General Comments General comments (skin integrity, edema, etc.): Pt on RA throughout session with O2 sats decreasing to 91% after transition to the recliner. Supplemental O2 again placed at end of session.    Exercises     Assessment/Plan    PT Assessment Patient needs continued PT services  PT Problem List Decreased strength;Decreased range of motion;Decreased activity tolerance;Decreased balance;Decreased mobility;Decreased knowledge of use of DME;Decreased safety awareness;Decreased knowledge of precautions;Pain       PT Treatment Interventions DME instruction;Gait training;Stair training;Functional mobility training;Therapeutic activities;Therapeutic exercise;Balance training;Patient/family education    PT Goals (Current goals can be found in the Care Plan section)  Acute Rehab PT Goals Patient Stated Goal: None stated PT Goal Formulation: With patient Time For Goal Achievement: 03/10/22 Potential to Achieve Goals: Good    Frequency Min 3X/week     Co-evaluation               AM-PAC PT "6 Clicks" Mobility  Outcome Measure Help needed turning from your back to your side while in a flat bed without using bedrails?: A Little Help needed moving from lying on your back to sitting on the side of a flat bed without using bedrails?: A Little Help needed moving to and from a bed  to a chair (including a wheelchair)?: A Lot Help needed standing up from a chair using your arms (e.g., wheelchair or bedside chair)?: A Lot Help needed to walk in hospital room?: A Little Help needed climbing 3-5 steps with a railing? : A Lot 6 Click Score: 15    End of Session Equipment Utilized During Treatment: Gait belt Activity Tolerance: Patient tolerated treatment well Patient left: in chair;with call bell/phone within reach;with chair alarm set Nurse Communication: Mobility  status PT Visit Diagnosis: Unsteadiness on feet (R26.81);Difficulty in walking, not elsewhere classified (R26.2)    Time: 0634-9494 PT Time Calculation (min) (ACUTE ONLY): 21 min   Charges:   PT Evaluation $PT Eval Moderate Complexity: 1 Mod          Rolinda Roan, PT, DPT Acute Rehabilitation Services Secure Chat Preferred Office: [phone]   Thelma Comp 03/03/2022, 3:05 PM

## 2022-03-03 NOTE — Progress Notes (Signed)
Pt confused and keeps removing leads from chest.

## 2022-03-03 NOTE — Progress Notes (Addendum)
CRITICAL VALUE STICKER  CRITICAL VALUE: INR 4.9  RECEIVER (on-site recipient of call): Lenox Ponds LPN  DATE & TIME 04/08/77 450  NOTIFIED:   MESSENGER B taylor Pierz  Lab @ 450(representative from lab):  MD NOTIFIED: Raenette Rover NP   [place]:  RESPONSE:  APP informed no action taken at this time.

## 2022-03-03 NOTE — TOC CM/SW Note (Addendum)
Left son Aveon Colquhoun [phone] and daughter Rebecca Eaton voicemail's to discuss disposition. Await call back   [facility] called back. Discussed PT/OT recommendations. Per [name] patient does not have supervision or assistance at home. Her mother needs assistance also , they live in two different places. Patient has been to [facility] in past, that would be Lisa's first choice if they are available.   Lurline Del returned call. Above discussed he is in agreement

## 2022-03-04 ENCOUNTER — Inpatient Hospital Stay (HOSPITAL_COMMUNITY): Payer: Medicare Other

## 2022-03-04 DIAGNOSIS — J189 Pneumonia, unspecified organism: Secondary | ICD-10-CM | POA: Diagnosis not present

## 2022-03-04 LAB — BASIC METABOLIC PANEL
Anion gap: 8 (ref 5–15)
BUN: 52 mg/dL — ABNORMAL HIGH (ref 8–23)
CO2: 31 mmol/L (ref 22–32)
Calcium: 9 mg/dL (ref 8.9–10.3)
Chloride: 103 mmol/L (ref 98–111)
Creatinine, Ser: 1.71 mg/dL — ABNORMAL HIGH (ref 0.61–1.24)
GFR, Estimated: 37 mL/min — ABNORMAL LOW (ref >60)
Glucose, Bld: 249 mg/dL — ABNORMAL HIGH (ref 70–99)
Potassium: 3.7 mmol/L (ref 3.5–5.1)
Sodium: 142 mmol/L (ref 135–145)

## 2022-03-04 LAB — CBC
HCT: 33.7 % — ABNORMAL LOW (ref 39.0–52.0)
Hemoglobin: 10.2 g/dL — ABNORMAL LOW (ref 13.0–17.0)
MCH: 26.4 pg (ref 26.0–34.0)
MCHC: 30.3 g/dL (ref 30.0–36.0)
MCV: 87.3 fL (ref 80.0–100.0)
Platelets: 218 10*3/uL (ref 150–400)
RBC: 3.86 MIL/uL — ABNORMAL LOW (ref 4.22–5.81)
RDW: 16.5 % — ABNORMAL HIGH (ref 11.5–15.5)
WBC: 5.5 10*3/uL (ref 4.0–10.5)
nRBC: 0 % (ref 0.0–0.2)

## 2022-03-04 LAB — PROCALCITONIN: Procalcitonin: 0.24 ng/mL

## 2022-03-04 LAB — PROTIME-INR
INR: 4.4 (ref 0.8–1.2)
Prothrombin Time: 41.5 seconds — ABNORMAL HIGH (ref 11.4–15.2)

## 2022-03-04 LAB — MAGNESIUM: Magnesium: 2.5 mg/dL — ABNORMAL HIGH (ref 1.7–2.4)

## 2022-03-04 LAB — GLUCOSE, CAPILLARY
Glucose-Capillary: 318 mg/dL — ABNORMAL HIGH (ref 70–99)
Glucose-Capillary: 277 mg/dL — ABNORMAL HIGH (ref 70–99)
Glucose-Capillary: 123 mg/dL — ABNORMAL HIGH (ref 70–99)
Glucose-Capillary: 184 mg/dL — ABNORMAL HIGH (ref 70–99)
Glucose-Capillary: 191 mg/dL — ABNORMAL HIGH (ref 70–99)

## 2022-03-04 NOTE — Progress Notes (Signed)
Presented current SNF bed offers to pt's son and dtr, they have accepted [facility]. Per MD, plan for dc Sunday. Confirmed with Olivia Mackie at [facility] they are able to accept pt Sunday. SW will follow.   Wandra Feinstein, MSW, LCSW [phone] (coverage)

## 2022-03-04 NOTE — Progress Notes (Signed)
Modified Barium Swallow Progress Note  Patient Details  Name: [name] MRN: [id number] Date of Birth: 25-Nov-1925  Today's Date: 03/04/2022  Modified Barium Swallow completed.  Full report located under Chart Review in the Imaging Section.  Brief recommendations include the following:  Clinical Impression  Pt presents with an oropharyngeal dysphagia, although note that images from radiology were not all saved for review. He has generally slow oral prep and posterior propulsion, as well as intermittent oral holding. He did have episodes of premature spillage and reduced epiglottic inversion that resulted in aspiration of thin and nectar thick liquids before the swallow, even though administered in small amounts. He has some strong coughing during the study but it does not necessarily correlate with aspiration. There are times that he silently aspirates, and there are times that he coughs without any airway invasion. It does not seem to be a reliable marker. Pt did have improved timing for airway protection with honey thick liquids and solids. His reduced base of tongue retraction contributes to vallecular residue, which increases, and cannot be readily cleared, as boluses become thicker. Recommend Dys 2 diet and honey thick liquids for now.   Swallow Evaluation Recommendations       SLP Diet Recommendations: Dysphagia 2 (Fine chop) solids;Honey thick liquids   Liquid Administration via: Cup;Straw   Medication Administration: Crushed with puree   Supervision: Staff to assist with self feeding;Full supervision/cueing for compensatory strategies   Compensations: Slow rate;Small sips/bites   Postural Changes: Seated upright at 90 degrees;Remain semi-upright after after feeds/meals (Comment)   Oral Care Recommendations: Oral care BID   Other Recommendations: Order thickener from pharmacy;Prohibited food (jello, ice cream, thin soups);Remove water pitcher    Osie Bond., M.A.  [place] Office [phone]  Secure chat preferred  03/04/2022,3:25 PM

## 2022-03-04 NOTE — Progress Notes (Addendum)
PROGRESS NOTE  [name] JQB:[id number] DOB: [birth year] DOA: 03/01/2022 PCP: Wenda Low, MD   LOS: 1 day   Brief Narrative / Interim history: 89 year old male with DM2, history of AVR on Coumadin, CHB status post PPM, A-fib, CAD, CKD 3, CHF comes into the hospital with shortness of breath, weakness associated with cough and fatigue.  This has been going on for the past week, along with decreased p.o. intake as well as pleuritic type chest pain with deep inspiration.  VQ scan in the ED was low probability for PE, and a CT scan of the chest was concerning for lingular infiltrates/debris.  He was placed on antibiotics and admitted to the hospital.  Subjective / 24h Interval events: No complaints for me this morning.  Remains confused  Assesement and Plan: Principal Problem:   CAP (community acquired pneumonia) Active Problems:   Atrial fibrillation ([place])   Coronary artery disease   S/P AVR   Gastroesophageal reflux disease   Essential hypertension, benign   Pacemaker   Iron deficiency anemia   Type 2 diabetes mellitus with vascular disease (HCC)   CKD (chronic kidney disease) stage 3, GFR 30-59 ml/min (HCC)   Heart block AV complete (HCC)   Acute respiratory failure with hypoxia (HCC)   Weakness   Principal problem Acute hypoxic respiratory failure due to community-acquired pneumonia, parainfluenza infection-he has been placed on antibiotics, continue for a total of 5 days.  COVID and flu are negative, RVP shows parainfluenza.  Continue to to wean off to room air as tolerated.  Procalcitonin borderline elevated 0.34 -PT eval recommending SNF, placement pending  Active problems AKI on CKD 4 - creatinine 2.16 in December 2023 which I suspect is his baseline.  Creatinine was up to 2.99 on admission, continues to improve, 1.7 this morning.  Continue to monitor  Acute metabolic encephalopathy-no reported history of dementia, likely in the setting of #1.  Remains mildly  confused, discussed with the patient's son and daughter over the phone, he normally is alert and oriented x 4, lives independently and able to manage all aspects of his life.  Continue to monitor  Hypokalemia - repleted, continue to monitor, potassium better, 3.7 today  Chronic edema, elevated BNP - Noted to have chronic edema and shortness of breath as above.  With BNP borderline at 300 and history of edema on torsemide will recheck echo.  Last echo was in 2018 with EF 60-65%, indeterminate diastolic, normal RV. - Holding home Lasix for now   Complete heart block - Status post pacemaker, paced rhythm in ED. scheduled to have battery replaced in a month  Atrial fibrillation - Continue home warfarin per pharmacy   CAD, status post CABG, Status post AVR - Continue home warfarin as above   Diabetes - SSI  CBG (last 3)  Recent Labs    03/03/22 2205 03/04/22 0728 03/04/22 0906  GLUCAP 108* 318* 277*     Scheduled Meds:  famotidine  20 mg Oral Daily   insulin aspart  0-9 Units Subcutaneous TID WC   sodium chloride flush  3 mL Intravenous Q12H   tamsulosin  0.4 mg Oral Daily   Warfarin - Pharmacist Dosing Inpatient   Does not apply q1600   Continuous Infusions:  azithromycin 500 mg (03/03/22 1235)   cefTRIAXone (ROCEPHIN)  IV 1 g (03/03/22 1149)   PRN Meds:.acetaminophen **OR** acetaminophen, albuterol, polyethylene glycol  Current Outpatient Medications  Medication Instructions   acetaminophen (TYLENOL) 325-650 mg, Oral, Every 6 hours PRN  amoxicillin (AMOXIL) 500 MG tablet Take 4 tablets by mouth 30-60 minutes prior to dental appointments   Coenzyme Q10 (CO Q-10) 100 MG CAPS 1 capsule, Oral, Daily   Elastic Bandages & Supports (V-2 HIGH COMPRESSION HOSE) MISC Daily   famotidine (PEPCID) 20 mg, Oral, Daily   glimepiride (AMARYL) 1 mg, Oral, 2 times daily   ketoconazole (NIZORAL) 2 % cream 1 application to affected area   nitroGLYCERIN (NITROSTAT) 0.4 mg, Sublingual, Every 5  min x3 PRN   tamsulosin (FLOMAX) 0.4 mg, Oral, Daily   torsemide (DEMADEX) 40 mg, Oral, 2 times daily   warfarin (COUMADIN) 2.5 MG tablet TAKE 1/2 A TABLET TO 1 TABLET BY MOUTH DAILY AS DIRECTED BY THE COUMADIN CLINIC.    Diet Orders (From admission, onward)     Start     Ordered   03/02/22 1403  Diet heart healthy/carb modified Room service appropriate? Yes; Fluid consistency: Thin; Fluid restriction: 2000 mL Fluid  Diet effective now       Question Answer Comment  Diet-HS Snack? Nothing   Room service appropriate? Yes   Fluid consistency: Thin   Fluid restriction: 2000 mL Fluid      03/02/22 1421            DVT prophylaxis:    Lab Results  Component Value Date   PLT 218 03/04/2022      Code Status: Full Code  Family Communication: no family at bedside, discussed with daughter and son over the phone  Status is: Inpatient   Level of care: Telemetry Medical  Consultants:  none  Objective: Vitals:   03/03/22 2204 03/04/22 0500 03/04/22 0743 03/04/22 0903  BP: 129/75 130/65 113/61 135/73  Pulse: 62 63 (!) 59 (!) 59  Resp:  '18 16 17  '$ Temp: 98.6 F (37 C) 98.8 F (37.1 C) 98.2 F (36.8 C) (!) 97.5 F (36.4 C)  TempSrc:  Oral Oral Oral  SpO2: 100%  100% 100%  Weight:  65.7 kg    Height:        Intake/Output Summary (Last 24 hours) at 03/04/2022 0936 Last data filed at 03/04/2022 0500 Gross per 24 hour  Intake 1070 ml  Output 800 ml  Net 270 ml   Wt Readings from Last 3 Encounters:  03/04/22 65.7 kg  01/04/22 74 kg  11/12/21 74.8 kg    Examination:  Constitutional: NAD Eyes: lids and conjunctivae normal, no scleral icterus ENMT: mmm Neck: normal, supple Respiratory: clear to auscultation bilaterally, no wheezing, no crackles. Normal respiratory effort.  Cardiovascular: Regular rate and rhythm, no murmurs / rubs / gallops. No LE edema. Abdomen: soft, no distention, no tenderness. Bowel sounds positive.  Skin: no rashes Neurologic: no focal  deficits, equal strength   Data Reviewed: I have independently reviewed following labs and imaging studies   CBC Recent Labs  Lab 03/01/22 2055 03/01/22 2102 03/03/22 0229 03/04/22 0411  WBC 9.0  --  8.7 5.5  HGB 11.3* 12.6* 11.3* 10.2*  HCT 37.6* 37.0* 36.1* 33.7*  PLT 241  --  229 218  MCV 88.7  --  87.0 87.3  MCH 26.7  --  27.2 26.4  MCHC 30.1  --  31.3 30.3  RDW 16.3*  --  16.3* 16.5*  LYMPHSABS 1.0  --   --   --   MONOABS 0.5  --   --   --   EOSABS 0.0  --   --   --   BASOSABS 0.0  --   --   --  Recent Labs  Lab 03/01/22 2055 03/01/22 2102 03/02/22 0825 03/02/22 0829 03/03/22 0229 03/04/22 0411  NA 138 139 137  --  139 142  K 3.2* 3.3* 4.0  --  4.5 3.7  CL 96*  --  94*  --  101 103  CO2 27  --  21*  --  26 31  GLUCOSE 165*  --  383*  --  243* 249*  BUN 43*  --  55*  --  65* 52*  CREATININE 2.24*  --  2.99*  --  2.39* 1.71*  CALCIUM 8.6*  --  8.6*  --  8.8* 9.0  AST  --   --   --   --  31  --   ALT  --   --   --   --  16  --   ALKPHOS  --   --   --   --  50  --   BILITOT  --   --   --   --  0.5  --   ALBUMIN  --   --   --   --  2.9*  --   MG  --   --  2.3  --   --  2.5*  DDIMER  --   --   --  1.86*  --   --   PROCALCITON  --   --  0.36  --  0.34 0.24  INR  --   --   --  3.1* 4.9* 4.4*  BNP 308.0*  --   --   --   --   --      ------------------------------------------------------------------------------------------------------------------ No results for input(s): "CHOL", "HDL", "LDLCALC", "TRIG", "CHOLHDL", "LDLDIRECT" in the last 72 hours.  Lab Results  Component Value Date   HGBA1C 7.7 (H) 09/03/2016   ------------------------------------------------------------------------------------------------------------------ No results for input(s): "TSH", "T4TOTAL", "T3FREE", "THYROIDAB" in the last 72 hours.  Invalid input(s): "FREET3"  Cardiac Enzymes No results for input(s): "CKMB", "TROPONINI", "MYOGLOBIN" in the last 168 hours.  Invalid  input(s): "CK" ------------------------------------------------------------------------------------------------------------------    Component Value Date/Time   BNP 308.0 (H) 03/01/2022 2055    CBG: Recent Labs  Lab 03/03/22 1723 03/03/22 1812 03/03/22 2205 03/04/22 0728 03/04/22 0906  GLUCAP 57* 78 108* 318* 277*     Recent Results (from the past 240 hour(s))  Resp panel by RT-PCR (RSV, Flu A&B, Covid) Anterior Nasal Swab     Status: None   Collection Time: 03/01/22  8:55 PM   Specimen: Anterior Nasal Swab  Result Value Ref Range Status   SARS Coronavirus 2 by RT PCR NEGATIVE NEGATIVE Final    Comment: (NOTE) SARS-CoV-2 target nucleic acids are NOT DETECTED.  The SARS-CoV-2 RNA is generally detectable in upper respiratory specimens during the acute phase of infection. The lowest concentration of SARS-CoV-2 viral copies this assay can detect is 138 copies/mL. A negative result does not preclude SARS-Cov-2 infection and should not be used as the sole basis for treatment or other patient management decisions. A negative result may occur with  improper specimen collection/handling, submission of specimen other than nasopharyngeal swab, presence of viral mutation(s) within the areas targeted by this assay, and inadequate number of viral copies(<138 copies/mL). A negative result must be combined with clinical observations, patient history, and epidemiological information. The expected result is Negative.  Fact Sheet for Patients:  EntrepreneurPulse.com.au  Fact Sheet for Healthcare Providers:  IncredibleEmployment.be  This test is no t yet approved or cleared by the [place]  FDA and  has been authorized for detection and/or diagnosis of SARS-CoV-2 by FDA under an Emergency Use Authorization (EUA). This EUA will remain  in effect (meaning this test can be used) for the duration of the COVID-19 declaration under Section 564(b)(1) of  the Act, 21 U.S.C.section 360bbb-3(b)(1), unless the authorization is terminated  or revoked sooner.       Influenza A by PCR NEGATIVE NEGATIVE Final   Influenza B by PCR NEGATIVE NEGATIVE Final    Comment: (NOTE) The Xpert Xpress SARS-CoV-2/FLU/RSV plus assay is intended as an aid in the diagnosis of influenza from Nasopharyngeal swab specimens and should not be used as a sole basis for treatment. Nasal washings and aspirates are unacceptable for Xpert Xpress SARS-CoV-2/FLU/RSV testing.  Fact Sheet for Patients: EntrepreneurPulse.com.au  Fact Sheet for Healthcare Providers: IncredibleEmployment.be  This test is not yet approved or cleared by the [place] FDA and has been authorized for detection and/or diagnosis of SARS-CoV-2 by FDA under an Emergency Use Authorization (EUA). This EUA will remain in effect (meaning this test can be used) for the duration of the COVID-19 declaration under Section 564(b)(1) of the Act, 21 U.S.C. section 360bbb-3(b)(1), unless the authorization is terminated or revoked.     Resp Syncytial Virus by PCR NEGATIVE NEGATIVE Final    Comment: (NOTE) Fact Sheet for Patients: EntrepreneurPulse.com.au  Fact Sheet for Healthcare Providers: IncredibleEmployment.be  This test is not yet approved or cleared by the [place] FDA and has been authorized for detection and/or diagnosis of SARS-CoV-2 by FDA under an Emergency Use Authorization (EUA). This EUA will remain in effect (meaning this test can be used) for the duration of the COVID-19 declaration under Section 564(b)(1) of the Act, 21 U.S.C. section 360bbb-3(b)(1), unless the authorization is terminated or revoked.  Performed at [facility], [place] [address], [place], [place] [address]   Respiratory (~20 pathogens) panel by PCR     Status: Abnormal   Collection Time: 03/02/22  2:03 PM   Specimen:  Nasopharyngeal Swab; Respiratory  Result Value Ref Range Status   Adenovirus NOT DETECTED NOT DETECTED Final   Coronavirus 229E NOT DETECTED NOT DETECTED Final    Comment: (NOTE) The Coronavirus on the Respiratory Panel, DOES NOT test for the novel  Coronavirus (2019 nCoV)    Coronavirus HKU1 NOT DETECTED NOT DETECTED Final   Coronavirus NL63 NOT DETECTED NOT DETECTED Final   Coronavirus OC43 NOT DETECTED NOT DETECTED Final   Metapneumovirus NOT DETECTED NOT DETECTED Final   Rhinovirus / Enterovirus NOT DETECTED NOT DETECTED Final   Influenza A NOT DETECTED NOT DETECTED Final   Influenza B NOT DETECTED NOT DETECTED Final   Parainfluenza Virus 1 DETECTED (A) NOT DETECTED Final   Parainfluenza Virus 2 NOT DETECTED NOT DETECTED Final   Parainfluenza Virus 3 NOT DETECTED NOT DETECTED Final   Parainfluenza Virus 4 NOT DETECTED NOT DETECTED Final   Respiratory Syncytial Virus NOT DETECTED NOT DETECTED Final   Bordetella pertussis NOT DETECTED NOT DETECTED Final   Bordetella Parapertussis NOT DETECTED NOT DETECTED Final   Chlamydophila pneumoniae NOT DETECTED NOT DETECTED Final   Mycoplasma pneumoniae NOT DETECTED NOT DETECTED Final    Comment: Performed at [facility], [place]. [address] [place], [place] [address]     Radiology Studies: No results found.   Marzetta Board, MD, PhD [facility]  Between 7 am - 7 pm I am available, please contact me via Amion (for emergencies) or Securechat (non urgent messages)  Between 7 pm - 7 am I am not available, please contact night coverage MD/APP via Amion

## 2022-03-04 NOTE — Progress Notes (Signed)
A. [phone]) notified of critical INR 4.4

## 2022-03-04 NOTE — Inpatient Diabetes Management (Signed)
Inpatient Diabetes Program Recommendations  AACE/ADA: New Consensus Statement on Inpatient Glycemic Control (2015)  Target Ranges:  Prepandial:   less than 140 mg/dL      Peak postprandial:   less than 180 mg/dL (1-2 hours)      Critically ill patients:  140 - 180 mg/dL   Lab Results  Component Value Date   GLUCAP 277 (H) 03/04/2022   HGBA1C 7.7 (H) 09/03/2016    Review of Glycemic Control  Latest Reference Range & Units 03/03/22 12:19 03/03/22 16:48 03/03/22 17:23 03/03/22 18:12 03/03/22 22:05 03/04/22 07:28 03/04/22 09:06  Glucose-Capillary 70 - 99 mg/dL 212 (H) 28 (LL) 57 (L) 78 108 (H) 318 (H) 277 (H)  (LL): Data is critically low (H): Data is abnormally high (L): Data is abnormally low Diabetes history: Type 2 DM Outpatient Diabetes medications: Amaryl 1 mg BID Current orders for Inpatient glycemic control: Novolog 0-9 units TID  Inpatient Diabetes Program Recommendations:    Noted severe hypoglycemia of 28 mg/dL following two doses of correction. Of note, AM correction was given late. This may have been a contributor.  Consider decreasing correction to Novolog 0-6 units TID.   Thanks, Bronson Curb, MSN, RNC-OB Diabetes Coordinator [phone] (8a-5p)

## 2022-03-04 NOTE — Plan of Care (Signed)
  Problem: Skin Integrity: Goal: Risk for impaired skin integrity will decrease Outcome: Progressing   Problem: Tissue Perfusion: Goal: Adequacy of tissue perfusion will improve Outcome: Progressing   Problem: Coping: Goal: Level of anxiety will decrease Outcome: Progressing

## 2022-03-04 NOTE — Progress Notes (Signed)
ANTICOAGULATION CONSULT NOTE  Pharmacy Consult for warfarin  Indication: atrial fibrillation  Allergies  Allergen Reactions   Demerol [Meperidine] Other (See Comments)    Causes hallucinations   Meperidine Hcl Other (See Comments)   Nasal Spray Other (See Comments)   Simvastatin Other (See Comments)    Patient Measurements: Height: '5\' 10"'$  (177.8 cm) Weight: 65.7 kg (144 lb 13.5 oz) IBW/kg (Calculated) : 73  Vital Signs: Temp: 98.8 F (37.1 C) (01/05 0500) Temp Source: Oral (01/05 0500) BP: 130/65 (01/05 0500) Pulse Rate: 63 (01/05 0500)  Labs: Recent Labs    03/01/22 2055 03/01/22 2102 03/02/22 0825 03/02/22 0829 03/03/22 0229 03/04/22 0411  HGB 11.3* 12.6*  --   --  11.3* 10.2*  HCT 37.6* 37.0*  --   --  36.1* 33.7*  PLT 241  --   --   --  229 218  LABPROT  --   --   --  31.5* 45.2* 41.5*  INR  --   --   --  3.1* 4.9* 4.4*  CREATININE 2.24*  --  2.99*  --  2.39* 1.71*  TROPONINIHS 38*  --  32*  --   --   --      Estimated Creatinine Clearance: 24.5 mL/min (A) (by C-G formula based on SCr of 1.71 mg/dL (H)).  Assessment: Patient admitted with SOB. Hx of afib on warfarin PTA. Regimen confirmed with daughter as 1.'25mg'$  Tuesday and Saturday, 2.'5mg'$  all other days. Daughter also reports that patient has not had his weekly leafy greens for the past 2 weeks. Last dose was on 1/2 PM.   INR remains supratherapeutic  at 4.4 but trending down. Last dose of warfarin 1/2. CBC stable with no signs of bleeding.  Goal of Therapy:  INR 2-3 Monitor platelets by anticoagulation protocol: Yes   Plan:  INR is supratherapeutic, will hold dose today to allow downward trend.  Daily INR checks.   Erskine Speed, PharmD Clinical Pharmacist 03/04/2022,7:07 AM

## 2022-03-04 NOTE — Evaluation (Signed)
Clinical/Bedside Swallow Evaluation Patient Details  Name: [name] MRN: [id number] Date of Birth: [birth year]  Today's Date: 03/04/2022 Time: SLP Start Time (ACUTE ONLY): 34 SLP Stop Time (ACUTE ONLY): 1120 SLP Time Calculation (min) (ACUTE ONLY): 20 min  Past Medical History:  Past Medical History:  Diagnosis Date   Aortic stenosis    BPH (benign prostatic hypertrophy)    Coronary artery disease    Dermatophytosis of nail    Diabetes mellitus without complication (HCC)    type 2   Diverticulosis    DVT (deep venous thrombosis) (HCC)    Gastroesophageal reflux disease    GERD (gastroesophageal reflux disease)    Gout    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    Kidney stones    PAH (pulmonary artery hypertension) ([place])    PAS 63 on ECHO 09/2012   Permanent atrial fibrillation (HCC)    PVD (peripheral vascular disease) (HCC)    Severe sepsis ([place]) 09/02/2016   Shortness of breath    Symptomatic bradycardia 09/28/2012   s/p Medtronic pacemaker implanted by Dr Rayann Heman    Past Surgical History:  Past Surgical History:  Procedure Laterality Date   AORTIC VALVE REPLACEMENT  2006   BALLOON DILATION N/A 12/16/2013   Procedure: BALLOON DILATION;  Surgeon: Garlan Fair, MD;  Location: [facility] ENDOSCOPY;  Service: Endoscopy;  Laterality: N/A;   CORONARY ARTERY BYPASS GRAFT     ESOPHAGOGASTRODUODENOSCOPY N/A 12/16/2013   Procedure: ESOPHAGOGASTRODUODENOSCOPY (EGD);  Surgeon: Garlan Fair, MD;  Location: [facility] ENDOSCOPY;  Service: Endoscopy;  Laterality: N/A;   HERNIA REPAIR     PACEMAKER INSERTION  10-04-2012   Medtronic pacemaker implanted by Dr Rayann Heman for symptomatic bradycardia   PARAESOPHAGEAL HERNIA REPAIR     PERMANENT PACEMAKER INSERTION N/A 10/04/2012   Procedure: PERMANENT PACEMAKER INSERTION;  Surgeon: Thompson Grayer, MD;  Location: [facility] CATH LAB;  Service: Cardiovascular;  Laterality: N/A;   TEE WITHOUT CARDIOVERSION N/A 12/25/2014   Procedure: TRANSESOPHAGEAL  ECHOCARDIOGRAM (TEE)   (inpatient at [facility]);  Surgeon: Skeet Latch, MD;  Location: [facility] ENDOSCOPY;  Service: Cardiovascular;  Laterality: N/A;   HPI:  89 year old male with DM2, history of AVR on Coumadin, CHB status post PPM, A-fib, CAD, CKD 3, CHF comes into the hospital with shortness of breath, weakness associated with cough and fatigue.  This has been going on for the past week, along with decreased p.o. intake as well as pleuritic type chest pain with deep inspiration.  VQ scan in the ED was low probability for PE, and a CT scan of the chest was concerning for lingular infiltrates/debris. DX wtih Acute hypoxic respiratory failure due to community-acquired pneumonia, parainfluenza infection.    Assessment / Plan / Recommendation  Clinical Impression  Patient presents with indication of an pharyngeal phase dysphagia characterized by cough post swallow evident of aspiration with liquids thinner than honey thick. Solids consumed without overt evidence of aspiration. Recommend diet downgrade and MBS ASAP to evaluate swallowing physiology and determine least restrictive diet and plan for rehab. Will attempt for today however unsure if radiology able to accomodate SLP Visit Diagnosis: Dysphagia, pharyngeal phase (R13.13)    Aspiration Risk       Diet Recommendation Dysphagia 3 (Mech soft);Honey-thick liquid   Liquid Administration via: Cup Medication Administration: Whole meds with puree Supervision: Patient able to self feed;Full supervision/cueing for compensatory strategies Compensations: Slow rate;Small sips/bites Postural Changes: Seated upright at 90 degrees    Other  Recommendations Oral Care Recommendations: Oral care  BID Other Recommendations: Order thickener from pharmacy;Prohibited food (jello, ice cream, thin soups);Remove water pitcher    Recommendations for follow up therapy are one component of a multi-disciplinary discharge planning process, led by the attending physician.   Recommendations may be updated based on patient status, additional functional criteria and insurance authorization.  Follow up Recommendations  (TBD)      Assistance Recommended at Discharge    Functional Status Assessment    Frequency and Duration min 2x/week  2 weeks       Prognosis        Swallow Study   General HPI: 89 year old male with DM2, history of AVR on Coumadin, CHB status post PPM, A-fib, CAD, CKD 3, CHF comes into the hospital with shortness of breath, weakness associated with cough and fatigue.  This has been going on for the past week, along with decreased p.o. intake as well as pleuritic type chest pain with deep inspiration.  VQ scan in the ED was low probability for PE, and a CT scan of the chest was concerning for lingular infiltrates/debris. DX wtih Acute hypoxic respiratory failure due to community-acquired pneumonia, parainfluenza infection. Type of Study: Bedside Swallow Evaluation Previous Swallow Assessment: none noted Diet Prior to this Study: Regular;Thin liquids Temperature Spikes Noted: No Respiratory Status: Nasal cannula History of Recent Intubation: No Behavior/Cognition: Alert;Cooperative;Pleasant mood Oral Cavity Assessment: Dry Oral Care Completed by SLP: Recent completion by staff Oral Cavity - Dentition: Missing dentition;Dentures, not available Vision: Functional for self-feeding Self-Feeding Abilities: Able to feed self Patient Positioning: Upright in bed Baseline Vocal Quality: Normal Volitional Cough: Strong;Congested Volitional Swallow: Able to elicit    Oral/Motor/Sensory Function Overall Oral Motor/Sensory Function: Within functional limits   Ice Chips Ice chips: Not tested   Thin Liquid Thin Liquid: Impaired Presentation: Cup;Straw;Self Fed Pharyngeal  Phase Impairments: Cough - Immediate    Nectar Thick Nectar Thick Liquid: Impaired Presentation: Cup;Self Fed Pharyngeal Phase Impairments: Cough - Immediate   Honey Thick  Honey Thick Liquid: Within functional limits Presentation: Cup;Self fed   Puree Puree: Within functional limits Presentation: Self Fed   Solid     Solid: Within functional limits Presentation: Self Fed     [employer] MA, CCC-SLP  Xaniyah Buchholz Meryl 03/04/2022,11:45 AM

## 2022-03-05 DIAGNOSIS — J189 Pneumonia, unspecified organism: Secondary | ICD-10-CM | POA: Diagnosis not present

## 2022-03-05 LAB — GLUCOSE, CAPILLARY
Glucose-Capillary: 141 mg/dL — ABNORMAL HIGH (ref 70–99)
Glucose-Capillary: 144 mg/dL — ABNORMAL HIGH (ref 70–99)
Glucose-Capillary: 157 mg/dL — ABNORMAL HIGH (ref 70–99)
Glucose-Capillary: 125 mg/dL — ABNORMAL HIGH (ref 70–99)

## 2022-03-05 LAB — PROTIME-INR
INR: 3.8 — ABNORMAL HIGH (ref 0.8–1.2)
Prothrombin Time: 37 seconds — ABNORMAL HIGH (ref 11.4–15.2)

## 2022-03-05 MED ORDER — TORSEMIDE 20 MG PO TABS
40.0000 mg | ORAL_TABLET | Freq: Every day | ORAL | Status: DC
  Administered 2022-03-05 – 2022-03-06 (×2): 40 mg via ORAL
  Filled 2022-03-05 (×2): qty 2

## 2022-03-05 NOTE — Progress Notes (Signed)
ANTICOAGULATION CONSULT NOTE - Follow Up Consult  Pharmacy Consult for Warfarin Indication: atrial fibrillation  Allergies  Allergen Reactions   Demerol [Meperidine] Other (See Comments)    Causes hallucinations   Meperidine Hcl Other (See Comments)   Nasal Spray Other (See Comments)   Simvastatin Other (See Comments)    Patient Measurements: Height: '5\' 10"'$  (177.8 cm) Weight: 65.7 kg (144 lb 13.5 oz) IBW/kg (Calculated) : 73  Vital Signs: Temp: 97.8 F (36.6 C) (01/06 0813) Temp Source: Axillary (01/06 0455) BP: 116/65 (01/06 0813) Pulse Rate: 62 (01/06 0813)  Labs: Recent Labs    03/03/22 0229 03/04/22 0411 03/05/22 0301  HGB 11.3* 10.2*  --   HCT 36.1* 33.7*  --   PLT 229 218  --   LABPROT 45.2* 41.5* 37.0*  INR 4.9* 4.4* 3.8*  CREATININE 2.39* 1.71*  --     Estimated Creatinine Clearance: 24.5 mL/min (A) (by C-G formula based on SCr of 1.71 mg/dL (H)).   Assessment: [name] admitted with SOB. Hx of afib on warfarin PTA. Regimen confirmed with daughter as 1.'25mg'$  Tuesday and Saturday, 2.'5mg'$  all other days. Daughter also reports that patient has not had his weekly leafy greens for the past 2 weeks. Last PTA dose was on 1/2 PM. Pharmacy consulted for warfarin dosing.  INR remains supratherapeutic at 3.8 but trending down. CBC stable with no signs of bleeding per nurse.   Goal of Therapy:  INR 2-3 Monitor platelets by anticoagulation protocol: Yes   Plan:  Hold warfarin dose today to allow downward trend. Daily INR.  Monitor CBC and for s/sxs of bleeding.  Leticia Clas 03/05/2022,10:38 AM

## 2022-03-05 NOTE — Progress Notes (Signed)
PROGRESS NOTE  Leverne Amrhein BTD:[id number] DOB: [birth year] DOA: 03/01/2022 PCP: Wenda Low, MD   LOS: 2 days   Brief Narrative / Interim history: 89 year old male with DM2, history of AVR on Coumadin, CHB status post PPM, A-fib, CAD, CKD 3, CHF comes into the hospital with shortness of breath, weakness associated with cough and fatigue.  This has been going on for the past week, along with decreased p.o. intake as well as pleuritic type chest pain with deep inspiration.  VQ scan in the ED was low probability for PE, and a CT scan of the chest was concerning for lingular infiltrates/debris.  He was placed on antibiotics and admitted to the hospital.  Subjective / 24h Interval events: Mild confusion noted.  No complaints  Assesement and Plan: Principal Problem:   CAP (community acquired pneumonia) Active Problems:   Atrial fibrillation ([place])   Coronary artery disease   S/P AVR   Gastroesophageal reflux disease   Essential hypertension, benign   Pacemaker   Iron deficiency anemia   Type 2 diabetes mellitus with vascular disease (HCC)   CKD (chronic kidney disease) stage 3, GFR 30-59 ml/min (HCC)   Heart block AV complete (HCC)   Acute respiratory failure with hypoxia (HCC)   Weakness   Principal problem Acute hypoxic respiratory failure due to community-acquired pneumonia, parainfluenza infection-he has been placed on antibiotics, continue for a total of 5 days.  COVID and flu are negative, RVP shows parainfluenza.  Continue to to wean off to room air as tolerated.  Procalcitonin borderline elevated 0.34 -PT eval recommending SNF, placement pending, tomorrow morning after 3 midnights  Active problems AKI on CKD 4 - creatinine 2.16 in December 2023 which I suspect is his baseline.  Creatinine was up to 2.99 on admission, improved to 1.7  Acute metabolic encephalopathy-no reported history of dementia, likely in the setting of #1.  Remains mildly confused, discussed with the  patient's son and daughter over the phone, he normally is alert and oriented x 4, lives independently and able to manage all aspects of his life.  Continue to monitor  Hypokalemia - repleted, improved  Chronic edema, elevated BNP - Noted to have chronic edema and shortness of breath as above.  With BNP borderline at 300 and history of edema on torsemide will recheck echo.  Last echo was in 2018 with EF 60-65%, indeterminate diastolic, normal RV. -Resume home Lasix today   Complete heart block - Status post pacemaker, paced rhythm in ED. scheduled to have battery replaced in a month  Atrial fibrillation - Continue home warfarin per pharmacy   CAD, status post CABG, Status post AVR - Continue home warfarin as above   Diabetes - SSI  CBG (last 3)  Recent Labs    03/04/22 1617 03/04/22 2157 03/05/22 0835  GLUCAP 184* 191* 141*     Scheduled Meds:  famotidine  20 mg Oral Daily   insulin aspart  0-9 Units Subcutaneous TID WC   sodium chloride flush  3 mL Intravenous Q12H   tamsulosin  0.4 mg Oral Daily   Warfarin - Pharmacist Dosing Inpatient   Does not apply q1600   Continuous Infusions:  azithromycin 500 mg (03/04/22 1457)   cefTRIAXone (ROCEPHIN)  IV 1 g (03/04/22 1305)   PRN Meds:.acetaminophen **OR** acetaminophen, albuterol, polyethylene glycol  Current Outpatient Medications  Medication Instructions   acetaminophen (TYLENOL) 325-650 mg, Oral, Every 6 hours PRN   amoxicillin (AMOXIL) 500 MG tablet Take 4 tablets by mouth 30-60 minutes  prior to dental appointments   Coenzyme Q10 (CO Q-10) 100 MG CAPS 1 capsule, Oral, Daily   Elastic Bandages & Supports (V-2 HIGH COMPRESSION HOSE) MISC Daily   famotidine (PEPCID) 20 mg, Oral, Daily   glimepiride (AMARYL) 1 mg, Oral, 2 times daily   ketoconazole (NIZORAL) 2 % cream 1 application to affected area   nitroGLYCERIN (NITROSTAT) 0.4 mg, Sublingual, Every 5 min x3 PRN   tamsulosin (FLOMAX) 0.4 mg, Oral, Daily   torsemide  (DEMADEX) 40 mg, Oral, 2 times daily   warfarin (COUMADIN) 2.5 MG tablet TAKE 1/2 A TABLET TO 1 TABLET BY MOUTH DAILY AS DIRECTED BY THE COUMADIN CLINIC.    Diet Orders (From admission, onward)     Start     Ordered   03/04/22 1347  DIET DYS 2 Room service appropriate? Yes with Assist; Fluid consistency: Honey Thick  Diet effective now       Comments: Meds crushed  Question Answer Comment  Room service appropriate? Yes with Assist   Fluid consistency: Honey Thick      03/04/22 1347            DVT prophylaxis:    Lab Results  Component Value Date   PLT 218 03/04/2022      Code Status: Full Code  Family Communication: no family at bedside, discussed with daughter and son over the phone  Status is: Inpatient   Level of care: Telemetry Medical  Consultants:  none  Objective: Vitals:   03/04/22 1657 03/04/22 2056 03/05/22 0455 03/05/22 0813  BP: (!) 96/51 (!) 110/54 108/61 116/65  Pulse: (!) 59 (!) 58 60 62  Resp: '17 16 16 16  '$ Temp: 97.8 F (36.6 C) 98.5 F (36.9 C) 97.8 F (36.6 C) 97.8 F (36.6 C)  TempSrc: Oral Axillary Axillary   SpO2: 97% 94% 94% 93%  Weight:      Height:        Intake/Output Summary (Last 24 hours) at 03/05/2022 0906 Last data filed at 03/05/2022 0600 Gross per 24 hour  Intake 580 ml  Output 900 ml  Net -320 ml    Wt Readings from Last 3 Encounters:  03/04/22 65.7 kg  01/04/22 74 kg  11/12/21 74.8 kg    Examination:  Constitutional: NAD Eyes: lids and conjunctivae normal, no scleral icterus ENMT: mmm Neck: normal, supple Respiratory: clear to auscultation bilaterally, no wheezing, no crackles. Normal respiratory effort.  Cardiovascular: Regular rate and rhythm, no murmurs / rubs / gallops. No LE edema. Abdomen: soft, no distention, no tenderness. Bowel sounds positive.  Skin: no rashes Neurologic: no focal deficits, equal strength   Data Reviewed: I have independently reviewed following labs and imaging studies    CBC Recent Labs  Lab 03/01/22 2055 03/01/22 2102 03/03/22 0229 03/04/22 0411  WBC 9.0  --  8.7 5.5  HGB 11.3* 12.6* 11.3* 10.2*  HCT 37.6* 37.0* 36.1* 33.7*  PLT 241  --  229 218  MCV 88.7  --  87.0 87.3  MCH 26.7  --  27.2 26.4  MCHC 30.1  --  31.3 30.3  RDW 16.3*  --  16.3* 16.5*  LYMPHSABS 1.0  --   --   --   MONOABS 0.5  --   --   --   EOSABS 0.0  --   --   --   BASOSABS 0.0  --   --   --      Recent Labs  Lab 03/01/22 2055 03/01/22 2102 03/02/22 0825  03/02/22 0829 03/03/22 0229 03/04/22 0411 03/05/22 0301  NA 138 139 137  --  139 142  --   K 3.2* 3.3* 4.0  --  4.5 3.7  --   CL 96*  --  94*  --  101 103  --   CO2 27  --  21*  --  26 31  --   GLUCOSE 165*  --  383*  --  243* 249*  --   BUN 43*  --  55*  --  65* 52*  --   CREATININE 2.24*  --  2.99*  --  2.39* 1.71*  --   CALCIUM 8.6*  --  8.6*  --  8.8* 9.0  --   AST  --   --   --   --  31  --   --   ALT  --   --   --   --  16  --   --   ALKPHOS  --   --   --   --  50  --   --   BILITOT  --   --   --   --  0.5  --   --   ALBUMIN  --   --   --   --  2.9*  --   --   MG  --   --  2.3  --   --  2.5*  --   DDIMER  --   --   --  1.86*  --   --   --   PROCALCITON  --   --  0.36  --  0.34 0.24  --   INR  --   --   --  3.1* 4.9* 4.4* 3.8*  BNP 308.0*  --   --   --   --   --   --      ------------------------------------------------------------------------------------------------------------------ No results for input(s): "CHOL", "HDL", "LDLCALC", "TRIG", "CHOLHDL", "LDLDIRECT" in the last 72 hours.  Lab Results  Component Value Date   HGBA1C 7.7 (H) 09/03/2016   ------------------------------------------------------------------------------------------------------------------ No results for input(s): "TSH", "T4TOTAL", "T3FREE", "THYROIDAB" in the last 72 hours.  Invalid input(s): "FREET3"  Cardiac Enzymes No results for input(s): "CKMB", "TROPONINI", "MYOGLOBIN" in the last 168 hours.  Invalid input(s):  "CK" ------------------------------------------------------------------------------------------------------------------    Component Value Date/Time   BNP 308.0 (H) 03/01/2022 2055    CBG: Recent Labs  Lab 03/04/22 0906 03/04/22 1134 03/04/22 1617 03/04/22 2157 03/05/22 0835  GLUCAP 277* 123* 184* 191* 141*     Recent Results (from the past 240 hour(s))  Resp panel by RT-PCR (RSV, Flu A&B, Covid) Anterior Nasal Swab     Status: None   Collection Time: 03/01/22  8:55 PM   Specimen: Anterior Nasal Swab  Result Value Ref Range Status   SARS Coronavirus 2 by RT PCR NEGATIVE NEGATIVE Final    Comment: (NOTE) SARS-CoV-2 target nucleic acids are NOT DETECTED.  The SARS-CoV-2 RNA is generally detectable in upper respiratory specimens during the acute phase of infection. The lowest concentration of SARS-CoV-2 viral copies this assay can detect is 138 copies/mL. A negative result does not preclude SARS-Cov-2 infection and should not be used as the sole basis for treatment or other patient management decisions. A negative result may occur with  improper specimen collection/handling, submission of specimen other than nasopharyngeal swab, presence of viral mutation(s) within the areas targeted by this assay, and inadequate number of viral copies(<138 copies/mL). A negative result must  be combined with clinical observations, patient history, and epidemiological information. The expected result is Negative.  Fact Sheet for Patients:  EntrepreneurPulse.com.au  Fact Sheet for Healthcare Providers:  IncredibleEmployment.be  This test is no t yet approved or cleared by the [place] FDA and  has been authorized for detection and/or diagnosis of SARS-CoV-2 by FDA under an Emergency Use Authorization (EUA). This EUA will remain  in effect (meaning this test can be used) for the duration of the COVID-19 declaration under Section 564(b)(1) of the  Act, 21 U.S.C.section 360bbb-3(b)(1), unless the authorization is terminated  or revoked sooner.       Influenza A by PCR NEGATIVE NEGATIVE Final   Influenza B by PCR NEGATIVE NEGATIVE Final    Comment: (NOTE) The Xpert Xpress SARS-CoV-2/FLU/RSV plus assay is intended as an aid in the diagnosis of influenza from Nasopharyngeal swab specimens and should not be used as a sole basis for treatment. Nasal washings and aspirates are unacceptable for Xpert Xpress SARS-CoV-2/FLU/RSV testing.  Fact Sheet for Patients: EntrepreneurPulse.com.au  Fact Sheet for Healthcare Providers: IncredibleEmployment.be  This test is not yet approved or cleared by the [place] FDA and has been authorized for detection and/or diagnosis of SARS-CoV-2 by FDA under an Emergency Use Authorization (EUA). This EUA will remain in effect (meaning this test can be used) for the duration of the COVID-19 declaration under Section 564(b)(1) of the Act, 21 U.S.C. section 360bbb-3(b)(1), unless the authorization is terminated or revoked.     Resp Syncytial Virus by PCR NEGATIVE NEGATIVE Final    Comment: (NOTE) Fact Sheet for Patients: EntrepreneurPulse.com.au  Fact Sheet for Healthcare Providers: IncredibleEmployment.be  This test is not yet approved or cleared by the [place] FDA and has been authorized for detection and/or diagnosis of SARS-CoV-2 by FDA under an Emergency Use Authorization (EUA). This EUA will remain in effect (meaning this test can be used) for the duration of the COVID-19 declaration under Section 564(b)(1) of the Act, 21 U.S.C. section 360bbb-3(b)(1), unless the authorization is terminated or revoked.  Performed at [facility], [place] [address] [place],  [address]   Respiratory (~20 pathogens) panel by PCR     Status: Abnormal   Collection Time: 03/02/22  2:03 PM   Specimen: Nasopharyngeal  Swab; Respiratory  Result Value Ref Range Status   Adenovirus NOT DETECTED NOT DETECTED Final   Coronavirus 229E NOT DETECTED NOT DETECTED Final    Comment: (NOTE) The Coronavirus on the Respiratory Panel, DOES NOT test for the novel  Coronavirus (2019 nCoV)    Coronavirus HKU1 NOT DETECTED NOT DETECTED Final   Coronavirus NL63 NOT DETECTED NOT DETECTED Final   Coronavirus OC43 NOT DETECTED NOT DETECTED Final   Metapneumovirus NOT DETECTED NOT DETECTED Final   Rhinovirus / Enterovirus NOT DETECTED NOT DETECTED Final   Influenza A NOT DETECTED NOT DETECTED Final   Influenza B NOT DETECTED NOT DETECTED Final   Parainfluenza Virus 1 DETECTED (A) NOT DETECTED Final   Parainfluenza Virus 2 NOT DETECTED NOT DETECTED Final   Parainfluenza Virus 3 NOT DETECTED NOT DETECTED Final   Parainfluenza Virus 4 NOT DETECTED NOT DETECTED Final   Respiratory Syncytial Virus NOT DETECTED NOT DETECTED Final   Bordetella pertussis NOT DETECTED NOT DETECTED Final   Bordetella Parapertussis NOT DETECTED NOT DETECTED Final   Chlamydophila pneumoniae NOT DETECTED NOT DETECTED Final   Mycoplasma pneumoniae NOT DETECTED NOT DETECTED Final    Comment: Performed at [facility], [place]. [place]  [address] [place], [place] [address]     Radiology Studies: DG Swallowing Func-Speech Pathology  Result Date: 03/04/2022 Table formatting from the original result was not included. Objective Swallowing Evaluation: Type of Study: MBS-Modified Barium Swallow Study  Patient Details Name: Bartt Gonzaga MRN: [id number] Date of Birth: 04-08-1925 Today's Date: 03/04/2022 Time: SLP Start Time (ACUTE ONLY): 1330 -SLP Stop Time (ACUTE ONLY): 6213 SLP Time Calculation (min) (ACUTE ONLY): 23 min Past Medical History: Past Medical History: Diagnosis Date  Aortic stenosis   BPH (benign prostatic hypertrophy)   Coronary artery disease   Dermatophytosis of nail   Diabetes mellitus without complication (HCC)   type 2  Diverticulosis   DVT (deep  venous thrombosis) (HCC)   Gastroesophageal reflux disease   GERD (gastroesophageal reflux disease)   Gout   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   Kidney stones   PAH (pulmonary artery hypertension) ([place])   PAS 63 on ECHO 09/2012  Permanent atrial fibrillation (HCC)   PVD (peripheral vascular disease) (HCC)   Severe sepsis ([place]) 09/02/2016  Shortness of breath   Symptomatic bradycardia 09/28/2012  s/p Medtronic pacemaker implanted by Dr Rayann Heman  Past Surgical History: Past Surgical History: Procedure Laterality Date  AORTIC VALVE REPLACEMENT  2006  BALLOON DILATION N/A 12/16/2013  Procedure: BALLOON DILATION;  Surgeon: Garlan Fair, MD;  Location: [facility] ENDOSCOPY;  Service: Endoscopy;  Laterality: N/A;  CORONARY ARTERY BYPASS GRAFT    ESOPHAGOGASTRODUODENOSCOPY N/A 12/16/2013  Procedure: ESOPHAGOGASTRODUODENOSCOPY (EGD);  Surgeon: Garlan Fair, MD;  Location: [facility] ENDOS[facility];  Service: Endoscopy;  Laterality: N/A;  HERNIA REPAIR    PACEMAKER INSERTION  10-04-2012  Medtronic pacemaker implanted by Dr Rayann Heman for symptomatic bradycardia  PARAESOPHAGEAL HERNIA REPAIR    PERMANENT PACEMAKER INSERTION N/A 10/04/2012  Procedure: PERMANENT PACEMAKER INSERTION;  Surgeon: Thompson Grayer, MD;  Location: [facility] CATH LAB;  Service: Cardiovascular;  Laterality: N/A;  TEE WITHOUT CARDIOVERSION N/A 12/25/2014  Procedure: TRANSESOPHAGEAL ECHOCARDIOGRAM (TEE)   (inpatient at [facility]);  Surgeon: Skeet Latch, MD;  Location: [facility] ENDOSCOPY;  Service: Cardiovascular;  Laterality: N/A; HPI: 89 year old male with DM2, history of AVR on Coumadin, CHB status post PPM, A-fib, CAD, CKD 3, CHF comes into the hospital with shortness of breath, weakness associated with cough and fatigue.  This has been going on for the past week, along with decreased p.o. intake as well as pleuritic type chest pain with deep inspiration.  VQ scan in the ED was low probability for PE, and a CT scan of the chest was concerning for lingular infiltrates/debris. DX wtih  Acute hypoxic respiratory failure due to community-acquired pneumonia, parainfluenza infection.  No data recorded  Recommendations for follow up therapy are one component of a multi-disciplinary discharge planning process, led by the attending physician.  Recommendations may be updated based on patient status, additional functional criteria and insurance authorization. Assessment / Plan / Recommendation   03/04/2022   3:00 PM Clinical Impressions Clinical Impression Pt presents with an oropharyngeal dysphagia, although note that images from radiology were not all saved for review. He has generally slow oral prep and posterior propulsion, as well as intermittent oral holding. He did have episodes of premature spillage and reduced epiglottic inversion that resulted in aspiration of thin and nectar thick liquids before the swallow, even though administered in small amounts. He has some strong coughing during the study but it does not necessarily correlate with aspiration. There are times that he silently aspirates, and there are times that he coughs without any airway invasion.  It does not seem to be a reliable marker. Pt did have improved timing for airway protection with honey thick liquids and solids. His reduced base of tongue retraction contributes to vallecular residue, which increases, and cannot be readily cleared, as boluses become thicker. Recommend Dys 2 diet and honey thick liquids for now. SLP Visit Diagnosis Dysphagia, oropharyngeal phase (R13.12) Impact on safety and function Moderate aspiration risk     03/04/2022   3:00 PM Treatment Recommendations Treatment Recommendations Therapy as outlined in treatment plan below     03/04/2022   3:00 PM Prognosis Prognosis for Safe Diet Advancement Good Barriers to Reach Goals Time post onset;Severity of deficits   03/04/2022   3:00 PM Diet Recommendations SLP Diet Recommendations Dysphagia 2 (Fine chop) solids;Honey thick liquids Liquid Administration via Cup;Straw  Medication Administration Crushed with puree Compensations Slow rate;Small sips/bites Postural Changes Seated upright at 90 degrees;Remain semi-upright after after feeds/meals (Comment)     03/04/2022   3:00 PM Other Recommendations Oral Care Recommendations Oral care BID Other Recommendations Order thickener from pharmacy;Prohibited food (jello, ice cream, thin soups);Remove water pitcher Follow Up Recommendations Skilled nursing-short term rehab (<3 hours/day) Functional Status Assessment Patient has had a recent decline in their functional status and demonstrates the ability to make significant improvements in function in a reasonable and predictable amount of time.   03/04/2022   3:00 PM Frequency and Duration  Speech Therapy Frequency (ACUTE ONLY) min 2x/week Treatment Duration 2 weeks     03/04/2022   3:00 PM Oral Phase Oral Phase Impaired Oral - Honey Cup Delayed oral transit Oral - Nectar Teaspoon Delayed oral transit Oral - Thin Teaspoon Delayed oral transit Oral - Puree Delayed oral transit Oral - Regular Delayed oral transit    03/04/2022   3:00 PM Pharyngeal Phase Pharyngeal Phase Impaired Pharyngeal- Honey Cup Reduced tongue base retraction;Pharyngeal residue - valleculae Pharyngeal- Nectar Teaspoon Penetration/Aspiration before swallow Pharyngeal Material enters airway, passes BELOW cords and not ejected out despite cough attempt by patient Pharyngeal- Thin Teaspoon Penetration/Aspiration before swallow Pharyngeal Material enters airway, passes BELOW cords without attempt by patient to eject out (silent aspiration) Pharyngeal- Puree Reduced tongue base retraction;Pharyngeal residue - valleculae Pharyngeal- Regular Reduced tongue base retraction;Pharyngeal residue - valleculae    03/04/2022   3:00 PM Cervical Esophageal Phase  Cervical Esophageal Phase [facility] Osie Bond., M.A. [place] Office [phone] Secure chat preferred 03/04/2022, 3:25 PM                       Marzetta Board, MD,  PhD [facility]  Between 7 am - 7 pm I am available, please contact me via Amion (for emergencies) or Securechat (non urgent messages)  Between 7 pm - 7 am I am not available, please contact night coverage MD/APP via Amion

## 2022-03-06 DIAGNOSIS — I442 Atrioventricular block, complete: Secondary | ICD-10-CM | POA: Diagnosis present

## 2022-03-06 DIAGNOSIS — N189 Chronic kidney disease, unspecified: Secondary | ICD-10-CM | POA: Diagnosis not present

## 2022-03-06 DIAGNOSIS — R627 Adult failure to thrive: Secondary | ICD-10-CM | POA: Diagnosis not present

## 2022-03-06 DIAGNOSIS — E1122 Type 2 diabetes mellitus with diabetic chronic kidney disease: Secondary | ICD-10-CM | POA: Diagnosis present

## 2022-03-06 DIAGNOSIS — Z951 Presence of aortocoronary bypass graft: Secondary | ICD-10-CM | POA: Diagnosis not present

## 2022-03-06 DIAGNOSIS — K219 Gastro-esophageal reflux disease without esophagitis: Secondary | ICD-10-CM | POA: Diagnosis not present

## 2022-03-06 DIAGNOSIS — J122 Parainfluenza virus pneumonia: Secondary | ICD-10-CM | POA: Diagnosis not present

## 2022-03-06 DIAGNOSIS — J811 Chronic pulmonary edema: Secondary | ICD-10-CM | POA: Diagnosis not present

## 2022-03-06 DIAGNOSIS — R64 Cachexia: Secondary | ICD-10-CM | POA: Diagnosis present

## 2022-03-06 DIAGNOSIS — E860 Dehydration: Secondary | ICD-10-CM | POA: Diagnosis not present

## 2022-03-06 DIAGNOSIS — I2721 Secondary pulmonary arterial hypertension: Secondary | ICD-10-CM | POA: Diagnosis present

## 2022-03-06 DIAGNOSIS — D509 Iron deficiency anemia, unspecified: Secondary | ICD-10-CM | POA: Diagnosis not present

## 2022-03-06 DIAGNOSIS — R739 Hyperglycemia, unspecified: Secondary | ICD-10-CM | POA: Diagnosis not present

## 2022-03-06 DIAGNOSIS — G9341 Metabolic encephalopathy: Secondary | ICD-10-CM | POA: Diagnosis present

## 2022-03-06 DIAGNOSIS — Z515 Encounter for palliative care: Secondary | ICD-10-CM | POA: Diagnosis not present

## 2022-03-06 DIAGNOSIS — I129 Hypertensive chronic kidney disease with stage 1 through stage 4 chronic kidney disease, or unspecified chronic kidney disease: Secondary | ICD-10-CM | POA: Diagnosis present

## 2022-03-06 DIAGNOSIS — N183 Chronic kidney disease, stage 3 unspecified: Secondary | ICD-10-CM | POA: Diagnosis not present

## 2022-03-06 DIAGNOSIS — Z7401 Bed confinement status: Secondary | ICD-10-CM | POA: Diagnosis not present

## 2022-03-06 DIAGNOSIS — I10 Essential (primary) hypertension: Secondary | ICD-10-CM | POA: Diagnosis not present

## 2022-03-06 DIAGNOSIS — E1159 Type 2 diabetes mellitus with other circulatory complications: Secondary | ICD-10-CM | POA: Diagnosis not present

## 2022-03-06 DIAGNOSIS — I4821 Permanent atrial fibrillation: Secondary | ICD-10-CM | POA: Diagnosis present

## 2022-03-06 DIAGNOSIS — R1310 Dysphagia, unspecified: Secondary | ICD-10-CM | POA: Diagnosis present

## 2022-03-06 DIAGNOSIS — I959 Hypotension, unspecified: Secondary | ICD-10-CM | POA: Diagnosis not present

## 2022-03-06 DIAGNOSIS — Y95 Nosocomial condition: Secondary | ICD-10-CM | POA: Diagnosis present

## 2022-03-06 DIAGNOSIS — Z7901 Long term (current) use of anticoagulants: Secondary | ICD-10-CM | POA: Diagnosis not present

## 2022-03-06 DIAGNOSIS — R0602 Shortness of breath: Secondary | ICD-10-CM | POA: Diagnosis not present

## 2022-03-06 DIAGNOSIS — N400 Enlarged prostate without lower urinary tract symptoms: Secondary | ICD-10-CM | POA: Diagnosis present

## 2022-03-06 DIAGNOSIS — I5043 Acute on chronic combined systolic (congestive) and diastolic (congestive) heart failure: Secondary | ICD-10-CM | POA: Diagnosis not present

## 2022-03-06 DIAGNOSIS — N1832 Chronic kidney disease, stage 3b: Secondary | ICD-10-CM | POA: Diagnosis not present

## 2022-03-06 DIAGNOSIS — J189 Pneumonia, unspecified organism: Secondary | ICD-10-CM | POA: Diagnosis not present

## 2022-03-06 DIAGNOSIS — R531 Weakness: Secondary | ICD-10-CM | POA: Diagnosis not present

## 2022-03-06 DIAGNOSIS — N184 Chronic kidney disease, stage 4 (severe): Secondary | ICD-10-CM | POA: Diagnosis present

## 2022-03-06 DIAGNOSIS — I2510 Atherosclerotic heart disease of native coronary artery without angina pectoris: Secondary | ICD-10-CM | POA: Diagnosis not present

## 2022-03-06 DIAGNOSIS — J90 Pleural effusion, not elsewhere classified: Secondary | ICD-10-CM | POA: Diagnosis not present

## 2022-03-06 DIAGNOSIS — R0902 Hypoxemia: Secondary | ICD-10-CM | POA: Diagnosis not present

## 2022-03-06 DIAGNOSIS — K449 Diaphragmatic hernia without obstruction or gangrene: Secondary | ICD-10-CM | POA: Diagnosis not present

## 2022-03-06 DIAGNOSIS — E1151 Type 2 diabetes mellitus with diabetic peripheral angiopathy without gangrene: Secondary | ICD-10-CM | POA: Diagnosis present

## 2022-03-06 DIAGNOSIS — Z7189 Other specified counseling: Secondary | ICD-10-CM | POA: Diagnosis not present

## 2022-03-06 DIAGNOSIS — Z950 Presence of cardiac pacemaker: Secondary | ICD-10-CM | POA: Diagnosis not present

## 2022-03-06 DIAGNOSIS — R4182 Altered mental status, unspecified: Secondary | ICD-10-CM | POA: Diagnosis present

## 2022-03-06 DIAGNOSIS — E870 Hyperosmolality and hypernatremia: Secondary | ICD-10-CM | POA: Diagnosis present

## 2022-03-06 DIAGNOSIS — I4891 Unspecified atrial fibrillation: Secondary | ICD-10-CM | POA: Diagnosis not present

## 2022-03-06 DIAGNOSIS — N179 Acute kidney failure, unspecified: Secondary | ICD-10-CM | POA: Diagnosis not present

## 2022-03-06 DIAGNOSIS — Z66 Do not resuscitate: Secondary | ICD-10-CM | POA: Diagnosis present

## 2022-03-06 DIAGNOSIS — Z952 Presence of prosthetic heart valve: Secondary | ICD-10-CM | POA: Diagnosis not present

## 2022-03-06 DIAGNOSIS — E440 Moderate protein-calorie malnutrition: Secondary | ICD-10-CM | POA: Diagnosis present

## 2022-03-06 DIAGNOSIS — J9601 Acute respiratory failure with hypoxia: Secondary | ICD-10-CM | POA: Diagnosis not present

## 2022-03-06 LAB — BASIC METABOLIC PANEL
Anion gap: 11 (ref 5–15)
BUN: 33 mg/dL — ABNORMAL HIGH (ref 8–23)
CO2: 31 mmol/L (ref 22–32)
Calcium: 8.8 mg/dL — ABNORMAL LOW (ref 8.9–10.3)
Chloride: 105 mmol/L (ref 98–111)
Creatinine, Ser: 1.47 mg/dL — ABNORMAL HIGH (ref 0.61–1.24)
GFR, Estimated: 44 mL/min — ABNORMAL LOW (ref >60)
Glucose, Bld: 141 mg/dL — ABNORMAL HIGH (ref 70–99)
Potassium: 3.6 mmol/L (ref 3.5–5.1)
Sodium: 147 mmol/L — ABNORMAL HIGH (ref 135–145)

## 2022-03-06 LAB — PROTIME-INR
INR: 2.8 — ABNORMAL HIGH (ref 0.8–1.2)
Prothrombin Time: 29.5 seconds — ABNORMAL HIGH (ref 11.4–15.2)

## 2022-03-06 LAB — GLUCOSE, CAPILLARY: Glucose-Capillary: 201 mg/dL — ABNORMAL HIGH (ref 70–99)

## 2022-03-06 LAB — MAGNESIUM: Magnesium: 2.2 mg/dL (ref 1.7–2.4)

## 2022-03-06 MED ORDER — WARFARIN SODIUM 2.5 MG PO TABS: 2.5000 mg | ORAL_TABLET | Freq: Once | ORAL | Status: DC

## 2022-03-06 NOTE — Progress Notes (Signed)
This nurse called report to [place] facility that patient will be transferred to via [place]. Report was given over the phone to [place], [place]. All questions answered.

## 2022-03-06 NOTE — Discharge Summary (Signed)
Physician Discharge Summary  Hulon Ferron [facility] IWP:[id number] DOB: 18-Jul-1925 DOA: 03/01/2022  PCP: Wenda Low, MD  Admit date: 03/01/2022 Discharge date: 03/06/2022  Admitted From: home Disposition:  SNF  Recommendations for Outpatient Follow-up:  Follow up with PCP in 1-2 weeks Please obtain BMP/CBC in one week  Home Health: none Equipment/Devices: none  Discharge Condition: stable CODE STATUS: Full code Diet Orders (From admission, onward)     Start     Ordered   03/04/22 1347  DIET DYS 2 Room service appropriate? Yes with Assist; Fluid consistency: Honey Thick  Diet effective now       Comments: Meds crushed  Question Answer Comment  Room service appropriate? Yes with Assist   Fluid consistency: Honey Thick      03/04/22 1347            HPI: Per admitting MD, Decari Duggar is a 89 y.o. male with medical history significant of diabetes, bradycardia, anemia, GERD, status post AVR, status post pacemaker, complete heart block, hypertension, atrial fibrillation, CAD, CKD 3, CHF presenting with shortness of breath and weakness with associated cough and fatigue. History obtained with assistance of family.  Patient reportedly has had a week of feeling unwell.  He has been experiencing fatigue, cough, weakness and shortness of breath.  He has some chronic lower extremity edema left greater than right with some chronic left-sided erythema.  Family reporting decreased p.o. intake.  Patient also reporting some pain with deep inspiration.  Initially hypoxic and did receive 2 DuoNeb's and route with EMS.  Reportedly is due for pacemaker battery change in the next week or 2. Does report L ankle/foot pain. He denies fevers, chills, abdominal pain, constipation, diarrhea, nausea, vomiting.  Hospital Course / Discharge diagnoses: Principal Problem:   CAP (community acquired pneumonia) Active Problems:   Atrial fibrillation ([place])   Coronary artery disease   S/P AVR    Gastroesophageal reflux disease   Essential hypertension, benign   Pacemaker   Iron deficiency anemia   Type 2 diabetes mellitus with vascular disease (HCC)   CKD (chronic kidney disease) stage 3, GFR 30-59 ml/min (HCC)   Heart block AV complete (HCC)   Acute respiratory failure with hypoxia (HCC)   Weakness   Principal problem Acute hypoxic respiratory failure due to community-acquired pneumonia, parainfluenza infection-he has been placed on antibiotics, completed 5 days of Ceftriaxone and Azithromycin.  COVID and flu are negative, RVP shows parainfluenza. He was weaned off to room air. Will require SNF   Active problems AKI on CKD 4 - creatinine 2.16 in December 2023 which I suspect is his baseline.  Creatinine was up to 2.99 on admission, improved to 1.47 on discharge. Continue to monitor Acute metabolic encephalopathy-no reported history of dementia, likely in the setting of #1.  Confusion improved.   Hypokalemia - repleted, improved Chronic edema, elevated BNP - euvolemic, continue home medications Complete heart block - Status post pacemaker, paced rhythm in ED. scheduled to have battery replaced in a month Atrial fibrillation - Continue home warfarin per pharmacy CAD, status post CABG, Status post AVR - Continue home warfarin as above  Sepsis ruled out   Discharge Instructions   Allergies as of 03/06/2022       Reactions   Demerol [meperidine] Other (See Comments)   Causes hallucinations   Meperidine Hcl Other (See Comments)   Nasal Spray Other (See Comments)   Simvastatin Other (See Comments)        Medication List  TAKE these medications    acetaminophen 325 MG tablet Commonly known as: TYLENOL Take 325-650 mg by mouth every 6 (six) hours as needed for mild pain or headache.   amoxicillin 500 MG tablet Commonly known as: AMOXIL Take 4 tablets by mouth 30-60 minutes prior to dental appointments   Co Q-10 100 MG Caps Take 1 capsule by mouth daily.    famotidine 20 MG tablet Commonly known as: PEPCID Take 20 mg by mouth daily.   glimepiride 1 MG tablet Commonly known as: AMARYL Take 1 mg by mouth 2 (two) times daily.   ketoconazole 2 % cream Commonly known as: NIZORAL 1 application to affected area   nitroGLYCERIN 0.4 MG SL tablet Commonly known as: NITROSTAT Place 1 tablet (0.4 mg total) under the tongue every 5 (five) minutes x 3 doses as needed for chest pain.   tamsulosin 0.4 MG Caps capsule Commonly known as: FLOMAX Take 1 capsule (0.4 mg total) by mouth daily.   torsemide 20 MG tablet Commonly known as: DEMADEX Take 2 tablets (40 mg total) by mouth 2 (two) times daily.   V-2 High Compression Hose Misc daily.   warfarin 2.5 MG tablet Commonly known as: COUMADIN Take as directed. If you are unsure how to take this medication, talk to your nurse or doctor. Original instructions: TAKE 1/2 A TABLET TO 1 TABLET BY MOUTH DAILY AS DIRECTED BY THE COUMADIN CLINIC. What changed: See the new instructions.        Contact information for after-discharge care     Destination     [facility] .   Service: Skilled Nursing Contact information: [place] [phone]                     Consultations: none  Procedures/Studies:  DG Swallowing Func-Speech Pathology  Result Date: 03/04/2022 Table formatting from the original result was not included. Objective Swallowing Evaluation: Type of Study: MBS-Modified Barium Swallow Study  Patient Details Name: Chukwuemeka Artola MRN: [id number] Date of Birth: 1925/06/24 Today's Date: 03/04/2022 Time: SLP Start Time (ACUTE ONLY): 1330 -SLP Stop Time (ACUTE ONLY): 5956 SLP Time Calculation (min) (ACUTE ONLY): 23 min Past Medical History: Past Medical History: Diagnosis Date  Aortic stenosis   BPH (benign prostatic hypertrophy)   Coronary artery disease   Dermatophytosis of nail   Diabetes mellitus  without complication (HCC)   type 2  Diverticulosis   DVT (deep venous thrombosis) (HCC)   Gastroesophageal reflux disease   GERD (gastroesophageal reflux disease)   Gout   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   Kidney stones   PAH (pulmonary artery hypertension) ([place])   PAS 63 on ECHO 09/2012  Permanent atrial fibrillation (HCC)   PVD (peripheral vascular disease) (HCC)   Severe sepsis ([place]) 09/02/2016  Shortness of breath   Symptomatic bradycardia 09/28/2012  s/p Medtronic pacemaker implanted by Dr Rayann Heman  Past Surgical History: Past Surgical History: Procedure Laterality Date  AORTIC VALVE REPLACEMENT  2006  BALLOON DILATION N/A 12/16/2013  Procedure: BALLOON DILATION;  Surgeon: Garlan Fair, MD;  Location: [facility] ENDOSCOPY;  Service: Endoscopy;  Laterality: N/A;  CORONARY ARTERY BYPASS GRAFT    ESOPHAGOGASTRODUODENOSCOPY N/A 12/16/2013  Procedure: ESOPHAGOGASTRODUODENOSCOPY (EGD);  Surgeon: Garlan Fair, MD;  Location: [facility];  Service: Endoscopy;  Laterality: N/A;  HERNIA REPAIR    PACEMAKER INSERTION  10-04-2012  Medtronic pacemaker implanted by Dr Rayann Heman for symptomatic bradycardia  PARAESOPHAGEAL HERNIA REPAIR  PERMANENT PACEMAKER INSERTION N/A 10/04/2012  Procedure: PERMANENT PACEMAKER INSERTION;  Surgeon: Thompson Grayer, MD;  Location: [facility] CATH LAB;  Service: Cardiovascular;  Laterality: N/A;  TEE WITHOUT CARDIOVERSION N/A 12/25/2014  Procedure: TRANSESOPHAGEAL ECHOCARDIOGRAM (TEE)   (inpatient at [facility]);  Surgeon: Skeet Latch, MD;  Location: [facility] ENDOSCOPY;  Service: Cardiovascular;  Laterality: N/A; HPI: 89 year old male with DM2, history of AVR on Coumadin, CHB status post PPM, A-fib, CAD, CKD 3, CHF comes into the hospital with shortness of breath, weakness associated with cough and fatigue.  This has been going on for the past week, along with decreased p.o. intake as well as pleuritic type chest pain with deep inspiration.  VQ scan in the ED was low probability for PE, and a CT scan of the  chest was concerning for lingular infiltrates/debris. DX wtih Acute hypoxic respiratory failure due to community-acquired pneumonia, parainfluenza infection.  No data recorded  Recommendations for follow up therapy are one component of a multi-disciplinary discharge planning process, led by the attending physician.  Recommendations may be updated based on patient status, additional functional criteria and insurance authorization. Assessment / Plan / Recommendation   03/04/2022   3:00 PM Clinical Impressions Clinical Impression Pt presents with an oropharyngeal dysphagia, although note that images from radiology were not all saved for review. He has generally slow oral prep and posterior propulsion, as well as intermittent oral holding. He did have episodes of premature spillage and reduced epiglottic inversion that resulted in aspiration of thin and nectar thick liquids before the swallow, even though administered in small amounts. He has some strong coughing during the study but it does not necessarily correlate with aspiration. There are times that he silently aspirates, and there are times that he coughs without any airway invasion. It does not seem to be a reliable marker. Pt did have improved timing for airway protection with honey thick liquids and solids. His reduced base of tongue retraction contributes to vallecular residue, which increases, and cannot be readily cleared, as boluses become thicker. Recommend Dys 2 diet and honey thick liquids for now. SLP Visit Diagnosis Dysphagia, oropharyngeal phase (R13.12) Impact on safety and function Moderate aspiration risk     03/04/2022   3:00 PM Treatment Recommendations Treatment Recommendations Therapy as outlined in treatment plan below     03/04/2022   3:00 PM Prognosis Prognosis for Safe Diet Advancement Good Barriers to Reach Goals Time post onset;Severity of deficits   03/04/2022   3:00 PM Diet Recommendations SLP Diet Recommendations Dysphagia 2 (Fine chop)  solids;Honey thick liquids Liquid Administration via Cup;Straw Medication Administration Crushed with puree Compensations Slow rate;Small sips/bites Postural Changes Seated upright at 90 degrees;Remain semi-upright after after feeds/meals (Comment)     03/04/2022   3:00 PM Other Recommendations Oral Care Recommendations Oral care BID Other Recommendations Order thickener from pharmacy;Prohibited food (jello, ice cream, thin soups);Remove water pitcher Follow Up Recommendations Skilled nursing-short term rehab (<3 hours/day) Functional Status Assessment Patient has had a recent decline in their functional status and demonstrates the ability to make significant improvements in function in a reasonable and predictable amount of time.   03/04/2022   3:00 PM Frequency and Duration  Speech Therapy Frequency (ACUTE ONLY) min 2x/week Treatment Duration 2 weeks     03/04/2022   3:00 PM Oral Phase Oral Phase Impaired Oral - Honey Cup Delayed oral transit Oral - Nectar Teaspoon Delayed oral transit Oral - Thin Teaspoon Delayed oral transit Oral - Puree Delayed oral transit Oral - Regular  Delayed oral transit    03/04/2022   3:00 PM Pharyngeal Phase Pharyngeal Phase Impaired Pharyngeal- Honey Cup Reduced tongue base retraction;Pharyngeal residue - valleculae Pharyngeal- Nectar Teaspoon Penetration/Aspiration before swallow Pharyngeal Material enters airway, passes BELOW cords and not ejected out despite cough attempt by patient Pharyngeal- Thin Teaspoon Penetration/Aspiration before swallow Pharyngeal Material enters airway, passes BELOW cords without attempt by patient to eject out (silent aspiration) Pharyngeal- Puree Reduced tongue base retraction;Pharyngeal residue - valleculae Pharyngeal- Regular Reduced tongue base retraction;Pharyngeal residue - valleculae    03/04/2022   3:00 PM Cervical Esophageal Phase  Cervical Esophageal Phase [facility] Osie Bond., M.A. CCC-SLP Acute Rehabilitation Services Office [phone] Secure chat preferred  03/04/2022, 3:25 PM                     VAS [place] LOWER EXTREMITY VENOUS (DVT) (ONLY [facility] & [facility])  Result Date: 03/02/2022  Lower Venous DVT Study Patient Name:  ZOEY GILKESON Mcbain  Date of Exam:   03/02/2022 Medical Rec #: [id number]            Accession #:    [id number] Date of Birth: [birth year]           Patient Gender: M Patient Age:   89 years Exam Location:  [facility] Procedure:      VAS [place] LOWER EXTREMITY VENOUS (DVT) Referring Phys: BRITNI HENDERLY --------------------------------------------------------------------------------  Indications: Left leg edema, erythema.  Risk Factors: CHF. Comparison Study: No prior studies. Performing Technologist: Darlin Coco RDMS, RVT  Examination Guidelines: A complete evaluation includes B-mode imaging, spectral Doppler, color Doppler, and power Doppler as needed of all accessible portions of each vessel. Bilateral testing is considered an integral part of a complete examination. Limited examinations for reoccurring indications may be performed as noted. The reflux portion of the exam is performed with the patient in reverse Trendelenburg.  +-----+---------------+---------+-----------+----------+--------------+ RIGHTCompressibilityPhasicitySpontaneityPropertiesThrombus Aging +-----+---------------+---------+-----------+----------+--------------+ CFV  Full           No       Yes                                 +-----+---------------+---------+-----------+----------+--------------+   +---------+---------------+---------+-----------+----------+--------------+ LEFT     CompressibilityPhasicitySpontaneityPropertiesThrombus Aging +---------+---------------+---------+-----------+----------+--------------+ CFV      Full           No       Yes                                 +---------+---------------+---------+-----------+----------+--------------+ SFJ      Full                                                         +---------+---------------+---------+-----------+----------+--------------+ FV Prox  Full                                                        +---------+---------------+---------+-----------+----------+--------------+ FV Mid   Full                                                        +---------+---------------+---------+-----------+----------+--------------+  FV DistalFull                                                        +---------+---------------+---------+-----------+----------+--------------+ PFV      Full                                                        +---------+---------------+---------+-----------+----------+--------------+ POP      Full           No       Yes                                 +---------+---------------+---------+-----------+----------+--------------+ PTV      Full                                                        +---------+---------------+---------+-----------+----------+--------------+ PERO     Full                                                        +---------+---------------+---------+-----------+----------+--------------+    Summary: RIGHT: - No evidence of common femoral vein obstruction.  LEFT: - There is no evidence of deep vein thrombosis in the lower extremity.  - No cystic structure found in the popliteal fossa.  -Pulsatile venous waveforms noted, consistent with elevated right heart pressures. *See table(s) above for measurements and observations. Electronically signed by Orlie Pollen on 03/02/2022 at 8:19:11 PM.    Final    DG Foot 2 Views Left  Result Date: 03/02/2022 CLINICAL DATA:  Left foot pain. EXAM: LEFT FOOT - 2 VIEW COMPARISON:  None Available. FINDINGS: No acute fracture or dislocation is identified. An accessory navicular is noted. Prominent soft tissue swelling is present diffusely along the dorsum of the foot extending across the ankle and into the lower leg. There are widespread  atherosclerotic vascular calcifications. IMPRESSION: Soft tissue swelling without acute osseous abnormality identified. Electronically Signed   By: Logan Bores M.D.   On: 03/02/2022 16:18   CT Chest Wo Contrast  Result Date: 03/02/2022 CLINICAL DATA:  Shortness of breath, fever and wheezing. EXAM: CT CHEST WITHOUT CONTRAST TECHNIQUE: Multidetector CT imaging of the chest was performed following the standard protocol without IV contrast. RADIATION DOSE REDUCTION: This exam was performed according to the departmental dose-optimization program which includes automated exposure control, adjustment of the mA and/or kV according to patient size and/or use of iterative reconstruction technique. COMPARISON:  X-ray 03/01/2022.  V/Q scan 03/02/2022 FINDINGS: Cardiovascular: Heart is nonenlarged. Prominent coronary artery calcifications there is also prominent calcifications along the course of the normal caliber thoracic aorta. The aorta has some tortuous course. No significant pericardial effusion. Left upper chest battery pack with leads extending towards the right side of the heart. Pacemaker. Status post  median sternotomy. Mediastinum/Nodes: No specific abnormal lymph node enlargement identified in the axillary region or hila on this noncontrast exam. He small less than 1 cm in size in short axis nodes in the mediastinum, not pathologic by size criteria. Severely elevated left hemidiaphragm herniation in the mediastinum of several structures including portions of stomach, pancreas, small bowel and vasculature. Surgical changes near this area. Please correlate with clinical history. Lungs/Pleura: Associated bandlike opacity left lung base. Atelectasis is favored. Mild right basilar atelectasis or scar as well. No pneumothorax. There is opacification noted of the central airways of the left lower lobe with parenchymal opacities. Also some areas dependently in lingula. Luminal debris along the bronchi. LS versus  infiltrate. Aggressive process is not excluded. Please correlate with any history Upper Abdomen: Over left hemidiaphragm hernia as above. The adrenal glands are preserved. Nonobstructive left-sided renal stone. Musculoskeletal: S shaped curvature of the spine multifocal degenerative changes. IMPRESSION: Severely elevated left hemidiaphragm with department of hernia as well. Adjacent parenchymal opacity is identified in the left lower lobe and dependent portion of the lingula with some opacification along bronchi in this location. Possible debris versus lesion. Please correlate with any known history. Additional workup as clinically directed. Pacemaker.  Postop chest. Electronically Signed   By: Jill Side M.D.   On: 03/02/2022 13:13   NM Pulmonary Perfusion  Result Date: 03/02/2022 CLINICAL DATA:  Chest pain.  Shortness of breath. EXAM: NUCLEAR MEDICINE PERFUSION LUNG SCAN TECHNIQUE: Perfusion images were obtained in multiple projections after intravenous injection of radiopharmaceutical. Ventilation scans intentionally deferred if perfusion scan and chest x-ray adequate for interpretation during COVID 19 epidemic. RADIOPHARMACEUTICALS:  3.6 mCi Tc-70mMAA IV COMPARISON:  X-ray 03/01/2022.  And older FINDINGS: There is preserved perfusion of the right lung diffusely. No significant defects. X-ray demonstrates surgical changes of the left hemithorax with volume loss and elevated left hemidiaphragm. The residual left lung has relatively poor perfusion compared to the right. The distribution pattern is low suspicion for pulmonary embolism overall. However with the appearance if there is no known specific prior imaging or history dedicated anatomic imaging of the chest with CT may be of some benefit IMPRESSION: Volume loss left hemithorax with relatively poor asymmetric perfusion of the residual left lung compared to right. Pattern is low suspicion for pulmonary embolism. However with the appearance CT imaging of  the chest may be of benefit to further delineate Electronically Signed   By: AJill SideM.D.   On: 03/02/2022 12:20   DG Chest Portable 1 View  Result Date: 03/01/2022 CLINICAL DATA:  Shortness of breath. EXAM: PORTABLE CHEST 1 VIEW COMPARISON:  09/05/2016. FINDINGS: The heart is enlarged and the mediastinal contour is stable. Atherosclerotic calcification of the aorta is noted. There is chronic elevation of the left diaphragm with atelectasis or infiltrate at the left lung base. No definite effusion or pneumothorax. No pneumothorax. Sternotomy wires are present over the midline. A single lead pacemaker device is present over the left chest. IMPRESSION: 1. Chronic elevation of the left diaphragm with atelectasis or infiltrate at the left lung base, unchanged from prior exams. 2. Cardiomegaly. Electronically Signed   By: LBrett FairyM.D.   On: 03/01/2022 21:23   CUP PACEART REMOTE DEVICE CHECK  Result Date: 02/04/2022 Scheduled remote reviewed. Normal device function.  The device estimates less than one month until ERI Next remote 03/08/2022. MKathy Breach RN, CCDS, [employer]    Subjective: - no chest pain, shortness of breath, no abdominal  pain, nausea or vomiting.   Discharge Exam: BP 132/76 (BP Location: Right Arm)   Pulse 61   Temp 97.8 F (36.6 C)   Resp 17   Ht '5\' 10"'$  (1.778 m)   Wt 65.7 kg   SpO2 97%   BMI 20.78 kg/m   General: Pt is alert, awake, not in acute distress Cardiovascular: RRR, S1/S2 +, no rubs, no gallops Respiratory: CTA bilaterally, no wheezing, no rhonchi Abdominal: Soft, NT, ND, bowel sounds + Extremities: no edema, no cyanosis    The results of significant diagnostics from this hospitalization (including imaging, microbiology, ancillary and laboratory) are listed below for reference.     Microbiology: Recent Results (from the past 240 hour(s))  Resp panel by RT-PCR (RSV, Flu A&B, Covid) Anterior Nasal Swab     Status: None   Collection  Time: 03/01/22  8:55 PM   Specimen: Anterior Nasal Swab  Result Value Ref Range Status   SARS Coronavirus 2 by RT PCR NEGATIVE NEGATIVE Final    Comment: (NOTE) SARS-CoV-2 target nucleic acids are NOT DETECTED.  The SARS-CoV-2 RNA is generally detectable in upper respiratory specimens during the acute phase of infection. The lowest concentration of SARS-CoV-2 viral copies this assay can detect is 138 copies/mL. A negative result does not preclude SARS-Cov-2 infection and should not be used as the sole basis for treatment or other patient management decisions. A negative result may occur with  improper specimen collection/handling, submission of specimen other than nasopharyngeal swab, presence of viral mutation(s) within the areas targeted by this assay, and inadequate number of viral copies(<138 copies/mL). A negative result must be combined with clinical observations, patient history, and epidemiological information. The expected result is Negative.  Fact Sheet for Patients:  EntrepreneurPulse.com.au  Fact Sheet for Healthcare Providers:  IncredibleEmployment.be  This test is no t yet approved or cleared by the [place] FDA and  has been authorized for detection and/or diagnosis of SARS-CoV-2 by FDA under an Emergency Use Authorization (EUA). This EUA will remain  in effect (meaning this test can be used) for the duration of the COVID-19 declaration under Section 564(b)(1) of the Act, 21 U.S.C.section 360bbb-3(b)(1), unless the authorization is terminated  or revoked sooner.       Influenza A by PCR NEGATIVE NEGATIVE Final   Influenza B by PCR NEGATIVE NEGATIVE Final    Comment: (NOTE) The Xpert Xpress SARS-CoV-2/FLU/RSV plus assay is intended as an aid in the diagnosis of influenza from Nasopharyngeal swab specimens and should not be used as a sole basis for treatment. Nasal washings and aspirates are unacceptable for Xpert Xpress  SARS-CoV-2/FLU/RSV testing.  Fact Sheet for Patients: EntrepreneurPulse.com.au  Fact Sheet for Healthcare Providers: IncredibleEmployment.be  This test is not yet approved or cleared by the [place] FDA and has been authorized for detection and/or diagnosis of SARS-CoV-2 by FDA under an Emergency Use Authorization (EUA). This EUA will remain in effect (meaning this test can be used) for the duration of the COVID-19 declaration under Section 564(b)(1) of the Act, 21 U.S.C. section 360bbb-3(b)(1), unless the authorization is terminated or revoked.     Resp Syncytial Virus by PCR NEGATIVE NEGATIVE Final    Comment: (NOTE) Fact Sheet for Patients: EntrepreneurPulse.com.au  Fact Sheet for Healthcare Providers: IncredibleEmployment.be  This test is not yet approved or cleared by the [place] FDA and has been authorized for detection and/or diagnosis of SARS-CoV-2 by FDA under an Emergency Use Authorization (EUA). This EUA will remain in effect (meaning this  test can be used) for the duration of the COVID-19 declaration under Section 564(b)(1) of the Act, 21 U.S.C. section 360bbb-3(b)(1), unless the authorization is terminated or revoked.  Performed at [facility], [place] [address] [place], [place] [address]   Respiratory (~20 pathogens) panel by PCR     Status: Abnormal   Collection Time: 03/02/22  2:03 PM   Specimen: Nasopharyngeal Swab; Respiratory  Result Value Ref Range Status   Adenovirus NOT DETECTED NOT DETECTED Final   Coronavirus 229E NOT DETECTED NOT DETECTED Final    Comment: (NOTE) The Coronavirus on the Respiratory Panel, DOES NOT test for the novel  Coronavirus (2019 nCoV)    Coronavirus HKU1 NOT DETECTED NOT DETECTED Final   Coronavirus NL63 NOT DETECTED NOT DETECTED Final   Coronavirus OC43 NOT DETECTED NOT DETECTED Final   Metapneumovirus NOT DETECTED NOT DETECTED Final    Rhinovirus / Enterovirus NOT DETECTED NOT DETECTED Final   Influenza A NOT DETECTED NOT DETECTED Final   Influenza B NOT DETECTED NOT DETECTED Final   Parainfluenza Virus 1 DETECTED (A) NOT DETECTED Final   Parainfluenza Virus 2 NOT DETECTED NOT DETECTED Final   Parainfluenza Virus 3 NOT DETECTED NOT DETECTED Final   Parainfluenza Virus 4 NOT DETECTED NOT DETECTED Final   Respiratory Syncytial Virus NOT DETECTED NOT DETECTED Final   Bordetella pertussis NOT DETECTED NOT DETECTED Final   Bordetella Parapertussis NOT DETECTED NOT DETECTED Final   Chlamydophila pneumoniae NOT DETECTED NOT DETECTED Final   Mycoplasma pneumoniae NOT DETECTED NOT DETECTED Final    Comment: Performed at [facility], [place]. [address], [place], [place] [address]     Labs: Basic Metabolic Panel: Recent Labs  Lab 03/01/22 2055 03/01/22 2102 03/02/22 0825 03/03/22 0229 03/04/22 0411 03/06/22 0217  NA 138 139 137 139 142 147*  K 3.2* 3.3* 4.0 4.5 3.7 3.6  CL 96*  --  94* 101 103 105  CO2 27  --  21* '26 31 31  '$ GLUCOSE 165*  --  383* 243* 249* 141*  BUN 43*  --  55* 65* 52* 33*  CREATININE 2.24*  --  2.99* 2.39* 1.71* 1.47*  CALCIUM 8.6*  --  8.6* 8.8* 9.0 8.8*  MG  --   --  2.3  --  2.5* 2.2   Liver Function Tests: Recent Labs  Lab 03/03/22 0229  AST 31  ALT 16  ALKPHOS 50  BILITOT 0.5  PROT 6.4*  ALBUMIN 2.9*   CBC: Recent Labs  Lab 03/01/22 2055 03/01/22 2102 03/03/22 0229 03/04/22 0411  WBC 9.0  --  8.7 5.5  NEUTROABS 7.4  --   --   --   HGB 11.3* 12.6* 11.3* 10.2*  HCT 37.6* 37.0* 36.1* 33.7*  MCV 88.7  --  87.0 87.3  PLT 241  --  229 218   CBG: Recent Labs  Lab 03/05/22 0835 03/05/22 1149 03/05/22 1724 03/05/22 2255 03/06/22 0848  GLUCAP 141* 144* 157* 125* 201*   Hgb A1c No results for input(s): "HGBA1C" in the last 72 hours. Lipid Profile No results for input(s): "CHOL", "HDL", "LDLCALC", "TRIG", "CHOLHDL", "LDLDIRECT" in the last 72 hours. Thyroid function  studies No results for input(s): "TSH", "T4TOTAL", "T3FREE", "THYROIDAB" in the last 72 hours.  Invalid input(s): "FREET3" Urinalysis    Component Value Date/Time   COLORURINE YELLOW 09/05/2016 2058   APPEARANCEUR HAZY (A) 09/05/2016 2058   LABSPEC 1.026 09/05/2016 2058   PHURINE 5.0 09/05/2016 2058   GLUCOSEU >=500 (  A) 09/05/2016 2058   HGBUR SMALL (A) 09/05/2016 2058   BILIRUBINUR NEGATIVE 09/05/2016 2058   KETONESUR 20 (A) 09/05/2016 2058   PROTEINUR 30 (A) 09/05/2016 2058   UROBILINOGEN 0.2 12/23/2014 0708   NITRITE NEGATIVE 09/05/2016 2058   LEUKOCYTESUR NEGATIVE 09/05/2016 2058    FURTHER DISCHARGE INSTRUCTIONS:   Get Medicines reviewed and adjusted: Please take all your medications with you for your next visit with your Primary MD   Laboratory/radiological data: Please request your Primary MD to go over all hospital tests and procedure/radiological results at the follow up, please ask your Primary MD to get all Hospital records sent to his/her office.   In some cases, they will be blood work, cultures and biopsy results pending at the time of your discharge. Please request that your primary care M.D. goes through all the records of your hospital data and follows up on these results.   Also Note the following: If you experience worsening of your admission symptoms, develop shortness of breath, life threatening emergency, suicidal or homicidal thoughts you must seek medical attention immediately by calling 911 or calling your MD immediately  if symptoms less severe.   You must read complete instructions/literature along with all the possible adverse reactions/side effects for all the Medicines you take and that have been prescribed to you. Take any new Medicines after you have completely understood and accpet all the possible adverse reactions/side effects.    Do not drive when taking Pain medications or sleeping medications (Benzodaizepines)   Do not take more than  prescribed Pain, Sleep and Anxiety Medications. It is not advisable to combine anxiety,sleep and pain medications without talking with your primary care practitioner   Special Instructions: If you have smoked or chewed Tobacco  in the last 2 yrs please stop smoking, stop any regular Alcohol  and or any Recreational drug use.   Wear Seat belts while driving.   Please note: You were cared for by a hospitalist during your hospital stay. Once you are discharged, your primary care physician will handle any further medical issues. Please note that NO REFILLS for any discharge medications will be authorized once you are discharged, as it is imperative that you return to your primary care physician (or establish a relationship with a primary care physician if you do not have one) for your post hospital discharge needs so that they can reassess your need for medications and monitor your lab values.  Time coordinating discharge: 40 minutes  SIGNED:  Marzetta Board, MD, PhD 03/06/2022, 9:57 AM

## 2022-03-06 NOTE — Progress Notes (Signed)
ANTICOAGULATION CONSULT NOTE - Follow Up Consult  Pharmacy Consult for Warfarin Indication: atrial fibrillation  Allergies  Allergen Reactions   Demerol [Meperidine] Other (See Comments)    Causes hallucinations   Meperidine Hcl Other (See Comments)   Nasal Spray Other (See Comments)   Simvastatin Other (See Comments)    Patient Measurements: Height: '5\' 10"'$  (177.8 cm) Weight: 65.7 kg (144 lb 13.5 oz) IBW/kg (Calculated) : 73  Vital Signs: Temp: 97.6 F (36.4 C) (01/07 0528) Temp Source: Oral (01/07 0528) BP: 114/66 (01/07 0528) Pulse Rate: 60 (01/07 0528)  Labs: Recent Labs    03/04/22 0411 03/05/22 0301 03/06/22 0217  HGB 10.2*  --   --   HCT 33.7*  --   --   PLT 218  --   --   LABPROT 41.5* 37.0* 29.5*  INR 4.4* 3.8* 2.8*  CREATININE 1.71*  --  1.47*    Estimated Creatinine Clearance: 28.6 mL/min (A) (by C-G formula based on SCr of 1.47 mg/dL (H)).   Assessment: 89YOM  admitted with SOB. Hx of afib on warfarin PTA. Regimen confirmed with daughter as 1.'25mg'$  Tuesday and Saturday, 2.'5mg'$  all other days. Daughter also reports that patient has not had his weekly leafy greens for the past 2 weeks. Last PTA dose was on 1/2 PM. Pharmacy consulted for warfarin dosing.   INR continues to trend down and is now therapeutic at 2.8. CBC stable with no signs of bleeding per nurse.  Goal of Therapy:  INR 2-3 Monitor platelets by anticoagulation protocol: Yes   Plan:  Give 2.'5mg'$  of warfarin today. Daily INR. Monitor CBC and for s/sxs of bleeding.  Leticia Clas, PharmD Candidate 03/06/2022,7:10 AM

## 2022-03-06 NOTE — Plan of Care (Signed)

## 2022-03-06 NOTE — TOC Transition Note (Addendum)
Transition of Care [facility]) - CM/SW Discharge Note   Patient Details  Name: [name] MRN: [id number] Date of Birth: August 29, 1925  [facility] CM/SW Contact:  Ina Homes, [place] Phone Number: 03/06/2022, 10:16 AM   Clinical Narrative:     Per MD pt, medically ready for d/c.   SW confirmed with [facility], ready to accept. Fax: [phone] Call Report: [phone]  SW spoke with pt's son [name] [phone]) will check with sister [name] regarding transport and call SW back.   Update 1020am  SW received callback from [facility] requested PTAR and to ensure pt has shoes and teeth  PTAR called.   Final next level of care: Skilled Nursing Facility Barriers to Discharge: Barriers Resolved   Patient Goals and CMS Choice      Discharge Placement                Patient chooses bed at: [facility], [place] Patient to be transferred to facility by: [facility] Name of family member notified: [name] Patient and family notified of of transfer: 03/06/22  Discharge Plan and Services Additional resources added to the After Visit Summary for                                       Social Determinants of Health (SDOH) Interventions SDOH Screenings   Tobacco Use: Low Risk  (03/02/2022)     Readmission Risk Interventions     No data to display

## 2022-03-07 ENCOUNTER — Other Ambulatory Visit: Payer: Self-pay

## 2022-03-07 ENCOUNTER — Emergency Department (HOSPITAL_COMMUNITY): Payer: Medicare Other

## 2022-03-07 ENCOUNTER — Inpatient Hospital Stay (HOSPITAL_COMMUNITY)
Admission: EM | Admit: 2022-03-07 | Discharge: 2022-03-12 | DRG: 193 | Disposition: A | Discharge status: Hospice | Payer: Medicare Other | Source: Skilled Nursing Facility | Attending: Internal Medicine | Admitting: Internal Medicine

## 2022-03-07 DIAGNOSIS — K449 Diaphragmatic hernia without obstruction or gangrene: Secondary | ICD-10-CM | POA: Diagnosis not present

## 2022-03-07 DIAGNOSIS — E860 Dehydration: Secondary | ICD-10-CM

## 2022-03-07 DIAGNOSIS — I959 Hypotension, unspecified: Secondary | ICD-10-CM | POA: Diagnosis not present

## 2022-03-07 DIAGNOSIS — J90 Pleural effusion, not elsewhere classified: Secondary | ICD-10-CM | POA: Diagnosis not present

## 2022-03-07 DIAGNOSIS — I2510 Atherosclerotic heart disease of native coronary artery without angina pectoris: Secondary | ICD-10-CM | POA: Diagnosis present

## 2022-03-07 DIAGNOSIS — I129 Hypertensive chronic kidney disease with stage 1 through stage 4 chronic kidney disease, or unspecified chronic kidney disease: Secondary | ICD-10-CM | POA: Diagnosis present

## 2022-03-07 DIAGNOSIS — N179 Acute kidney failure, unspecified: Principal | ICD-10-CM | POA: Diagnosis present

## 2022-03-07 DIAGNOSIS — N184 Chronic kidney disease, stage 4 (severe): Secondary | ICD-10-CM | POA: Diagnosis present

## 2022-03-07 DIAGNOSIS — R64 Cachexia: Secondary | ICD-10-CM | POA: Diagnosis present

## 2022-03-07 DIAGNOSIS — R627 Adult failure to thrive: Secondary | ICD-10-CM | POA: Diagnosis not present

## 2022-03-07 DIAGNOSIS — J122 Parainfluenza virus pneumonia: Secondary | ICD-10-CM | POA: Diagnosis not present

## 2022-03-07 DIAGNOSIS — Z7901 Long term (current) use of anticoagulants: Secondary | ICD-10-CM | POA: Diagnosis not present

## 2022-03-07 DIAGNOSIS — E1122 Type 2 diabetes mellitus with diabetic chronic kidney disease: Secondary | ICD-10-CM | POA: Diagnosis present

## 2022-03-07 DIAGNOSIS — J9601 Acute respiratory failure with hypoxia: Secondary | ICD-10-CM | POA: Diagnosis not present

## 2022-03-07 DIAGNOSIS — Y95 Nosocomial condition: Secondary | ICD-10-CM | POA: Diagnosis present

## 2022-03-07 DIAGNOSIS — I4821 Permanent atrial fibrillation: Secondary | ICD-10-CM | POA: Diagnosis present

## 2022-03-07 DIAGNOSIS — Z7401 Bed confinement status: Secondary | ICD-10-CM | POA: Diagnosis not present

## 2022-03-07 DIAGNOSIS — Z950 Presence of cardiac pacemaker: Secondary | ICD-10-CM | POA: Diagnosis present

## 2022-03-07 DIAGNOSIS — Z79899 Other long term (current) drug therapy: Secondary | ICD-10-CM

## 2022-03-07 DIAGNOSIS — R0902 Hypoxemia: Secondary | ICD-10-CM

## 2022-03-07 DIAGNOSIS — R4182 Altered mental status, unspecified: Secondary | ICD-10-CM | POA: Diagnosis not present

## 2022-03-07 DIAGNOSIS — R1310 Dysphagia, unspecified: Secondary | ICD-10-CM | POA: Diagnosis present

## 2022-03-07 DIAGNOSIS — N400 Enlarged prostate without lower urinary tract symptoms: Secondary | ICD-10-CM | POA: Diagnosis present

## 2022-03-07 DIAGNOSIS — N189 Chronic kidney disease, unspecified: Secondary | ICD-10-CM | POA: Diagnosis not present

## 2022-03-07 DIAGNOSIS — E440 Moderate protein-calorie malnutrition: Secondary | ICD-10-CM | POA: Diagnosis present

## 2022-03-07 DIAGNOSIS — Z888 Allergy status to other drugs, medicaments and biological substances status: Secondary | ICD-10-CM

## 2022-03-07 DIAGNOSIS — Z515 Encounter for palliative care: Secondary | ICD-10-CM

## 2022-03-07 DIAGNOSIS — Z951 Presence of aortocoronary bypass graft: Secondary | ICD-10-CM | POA: Diagnosis not present

## 2022-03-07 DIAGNOSIS — J811 Chronic pulmonary edema: Secondary | ICD-10-CM | POA: Diagnosis not present

## 2022-03-07 DIAGNOSIS — Z952 Presence of prosthetic heart valve: Secondary | ICD-10-CM | POA: Diagnosis not present

## 2022-03-07 DIAGNOSIS — R54 Age-related physical debility: Secondary | ICD-10-CM | POA: Diagnosis present

## 2022-03-07 DIAGNOSIS — N1832 Chronic kidney disease, stage 3b: Secondary | ICD-10-CM | POA: Diagnosis not present

## 2022-03-07 DIAGNOSIS — Z66 Do not resuscitate: Secondary | ICD-10-CM | POA: Diagnosis present

## 2022-03-07 DIAGNOSIS — Z7984 Long term (current) use of oral hypoglycemic drugs: Secondary | ICD-10-CM

## 2022-03-07 DIAGNOSIS — K219 Gastro-esophageal reflux disease without esophagitis: Secondary | ICD-10-CM | POA: Diagnosis present

## 2022-03-07 DIAGNOSIS — I6381 Other cerebral infarction due to occlusion or stenosis of small artery: Secondary | ICD-10-CM | POA: Diagnosis not present

## 2022-03-07 DIAGNOSIS — R531 Weakness: Secondary | ICD-10-CM | POA: Diagnosis not present

## 2022-03-07 DIAGNOSIS — J189 Pneumonia, unspecified organism: Principal | ICD-10-CM | POA: Diagnosis present

## 2022-03-07 DIAGNOSIS — N183 Chronic kidney disease, stage 3 unspecified: Secondary | ICD-10-CM | POA: Diagnosis present

## 2022-03-07 DIAGNOSIS — Z885 Allergy status to narcotic agent status: Secondary | ICD-10-CM

## 2022-03-07 DIAGNOSIS — E1151 Type 2 diabetes mellitus with diabetic peripheral angiopathy without gangrene: Secondary | ICD-10-CM | POA: Diagnosis present

## 2022-03-07 DIAGNOSIS — E870 Hyperosmolality and hypernatremia: Secondary | ICD-10-CM | POA: Diagnosis present

## 2022-03-07 DIAGNOSIS — G9341 Metabolic encephalopathy: Secondary | ICD-10-CM | POA: Diagnosis present

## 2022-03-07 DIAGNOSIS — I4891 Unspecified atrial fibrillation: Secondary | ICD-10-CM | POA: Diagnosis not present

## 2022-03-07 DIAGNOSIS — E1159 Type 2 diabetes mellitus with other circulatory complications: Secondary | ICD-10-CM | POA: Diagnosis present

## 2022-03-07 DIAGNOSIS — R404 Transient alteration of awareness: Secondary | ICD-10-CM | POA: Diagnosis not present

## 2022-03-07 DIAGNOSIS — I442 Atrioventricular block, complete: Secondary | ICD-10-CM | POA: Diagnosis present

## 2022-03-07 DIAGNOSIS — Z7189 Other specified counseling: Secondary | ICD-10-CM | POA: Diagnosis not present

## 2022-03-07 DIAGNOSIS — J9600 Acute respiratory failure, unspecified whether with hypoxia or hypercapnia: Secondary | ICD-10-CM | POA: Diagnosis not present

## 2022-03-07 DIAGNOSIS — R0602 Shortness of breath: Secondary | ICD-10-CM | POA: Diagnosis not present

## 2022-03-07 DIAGNOSIS — I2721 Secondary pulmonary arterial hypertension: Secondary | ICD-10-CM | POA: Diagnosis present

## 2022-03-07 DIAGNOSIS — R739 Hyperglycemia, unspecified: Secondary | ICD-10-CM | POA: Diagnosis not present

## 2022-03-07 DIAGNOSIS — Z6820 Body mass index (BMI) 20.0-20.9, adult: Secondary | ICD-10-CM

## 2022-03-07 DIAGNOSIS — N19 Unspecified kidney failure: Secondary | ICD-10-CM

## 2022-03-07 LAB — CBC WITH DIFFERENTIAL/PLATELET
Abs Immature Granulocytes: 0.05 10*3/uL (ref 0.00–0.07)
Basophils Absolute: 0 10*3/uL (ref 0.0–0.1)
Basophils Relative: 0 %
Eosinophils Absolute: 0 10*3/uL (ref 0.0–0.5)
Eosinophils Relative: 0 %
HCT: 44.8 % (ref 39.0–52.0)
Hemoglobin: 13.3 g/dL (ref 13.0–17.0)
Immature Granulocytes: 1 %
Lymphocytes Relative: 5 %
Lymphs Abs: 0.5 10*3/uL — ABNORMAL LOW (ref 0.7–4.0)
MCH: 26.9 pg (ref 26.0–34.0)
MCHC: 29.7 g/dL — ABNORMAL LOW (ref 30.0–36.0)
MCV: 90.7 fL (ref 80.0–100.0)
Monocytes Absolute: 0.7 10*3/uL (ref 0.1–1.0)
Monocytes Relative: 7 %
Neutro Abs: 8.2 10*3/uL — ABNORMAL HIGH (ref 1.7–7.7)
Neutrophils Relative %: 87 %
Platelets: 289 10*3/uL (ref 150–400)
RBC: 4.94 MIL/uL (ref 4.22–5.81)
RDW: 16.8 % — ABNORMAL HIGH (ref 11.5–15.5)
WBC: 9.4 10*3/uL (ref 4.0–10.5)
nRBC: 0 % (ref 0.0–0.2)

## 2022-03-07 LAB — PROTIME-INR
INR: 3.2 — ABNORMAL HIGH (ref 0.8–1.2)
Prothrombin Time: 32.8 seconds — ABNORMAL HIGH (ref 11.4–15.2)

## 2022-03-07 LAB — I-STAT CHEM 8, ED
BUN: 53 mg/dL — ABNORMAL HIGH (ref 8–23)
Calcium, Ion: 1.16 mmol/L (ref 1.15–1.40)
Chloride: 107 mmol/L (ref 98–111)
Creatinine, Ser: 2 mg/dL — ABNORMAL HIGH (ref 0.61–1.24)
Glucose, Bld: 340 mg/dL — ABNORMAL HIGH (ref 70–99)
HCT: 45 % (ref 39.0–52.0)
Hemoglobin: 15.3 g/dL (ref 13.0–17.0)
Potassium: 3.9 mmol/L (ref 3.5–5.1)
Sodium: 155 mmol/L — ABNORMAL HIGH (ref 135–145)
TCO2: 33 mmol/L — ABNORMAL HIGH (ref 22–32)

## 2022-03-07 LAB — I-STAT VENOUS BLOOD GAS, ED
Acid-Base Excess: 10 mmol/L — ABNORMAL HIGH (ref 0.0–2.0)
Bicarbonate: 37.6 mmol/L — ABNORMAL HIGH (ref 20.0–28.0)
Calcium, Ion: 1.14 mmol/L — ABNORMAL LOW (ref 1.15–1.40)
HCT: 43 % (ref 39.0–52.0)
Hemoglobin: 14.6 g/dL (ref 13.0–17.0)
O2 Saturation: 76 %
Potassium: 3.9 mmol/L (ref 3.5–5.1)
Sodium: 153 mmol/L — ABNORMAL HIGH (ref 135–145)
TCO2: 39 mmol/L — ABNORMAL HIGH (ref 22–32)
pCO2, Ven: 61.1 mmHg — ABNORMAL HIGH (ref 44–60)
pH, Ven: 7.396 (ref 7.25–7.43)
pO2, Ven: 43 mmHg (ref 32–45)

## 2022-03-07 NOTE — ED Provider Notes (Addendum)
[facility] EMERGENCY DEPARTMENT Provider Note   CSN: [id number] Arrival date & time: 03/07/22  2143     History  Chief Complaint  Patient presents with   Shortness of Breath    [name] is a 89 y.o. male.   Shortness of Breath  Patient is a 89 year old male with past medical history significant for A-fib on warfarin, pacemaker for symptomatic bradycardia, status post AV valve replacement, CABG, CKD, reflux, DM2   His daughter and son are at bedside who provide additional history as patient is weak and somewhat confused.  Seems that patient was discharged yesterday from this hospital after admission for respiratory failure with hypoxia thought to be secondary to parainfluenza virus.  He was found to become weaker and more confused per staff at skilled nursing facility/rehab facility and was brought via via EMS to emergency room where on EMS arrival they were found to be 88% on room air and placed on 2 L nasal cannula oxygen.  Patient denies any pain other than left shoulder pain which seems to have been bothering him for quite some time.  He denies any hemoptysis.  Seems that he has not eaten very much or drank much since he was discharged from the hospital.      Home Medications Prior to Admission medications   Medication Sig Start Date End Date Taking? Authorizing Provider  acetaminophen (TYLENOL) 325 MG tablet Take 325-650 mg by mouth every 6 (six) hours as needed for mild pain or headache.    [provider]  amoxicillin (AMOXIL) 500 MG tablet Take 4 tablets by mouth 30-60 minutes prior to dental appointments 11/12/21   Corky Crafts, MD  Coenzyme Q10 (CO Q-10) 100 MG CAPS Take 1 capsule by mouth daily.    [provider]  Elastic Bandages & Supports (V-2 HIGH COMPRESSION HOSE) MISC daily. 03/21/13   [provider]  famotidine (PEPCID) 20 MG tablet Take 20 mg by mouth daily.    [provider]   glimepiride (AMARYL) 1 MG tablet Take 1 mg by mouth 2 (two) times daily.     [provider]  ketoconazole (NIZORAL) 2 % cream 1 application to affected area Patient not taking: Reported on 03/02/2022 04/21/10   [provider]  nitroGLYCERIN (NITROSTAT) 0.4 MG SL tablet Place 1 tablet (0.4 mg total) under the tongue every 5 (five) minutes x 3 doses as needed for chest pain. 10/05/12   Corky Crafts, MD  tamsulosin (FLOMAX) 0.4 MG CAPS capsule Take 1 capsule (0.4 mg total) by mouth daily. 12/30/14   Albertine Grates, MD  torsemide (DEMADEX) 20 MG tablet Take 2 tablets (40 mg total) by mouth 2 (two) times daily. 09/13/21   Corky Crafts, MD  warfarin (COUMADIN) 2.5 MG tablet TAKE 1/2 A TABLET TO 1 TABLET BY MOUTH DAILY AS DIRECTED BY THE COUMADIN CLINIC. Patient taking differently: Take 1.25-2.5 mg by mouth See admin instructions. Take 1/2 a tablet to 1 tablet by mouth daily as directed by the coumadin clinic. 12/06/21   Corky Crafts, MD      Allergies    Demerol [meperidine], Meperidine hcl, Nasal spray, and Simvastatin    Review of Systems   Review of Systems  Respiratory:  Positive for shortness of breath.     Physical Exam Updated Vital Signs BP 122/72   Pulse 61   Temp 98.2 F (36.8 C) (Oral)   Resp (!) 25   Wt 65.3 kg  SpO2 93%   BMI 20.66 kg/m  Physical Exam Vitals and nursing note reviewed.  Constitutional:      Appearance: He is ill-appearing.     Comments: 89 year old male appears unwell, grimacing  HENT:     Head: Normocephalic and atraumatic.     Nose: Nose normal.     Mouth/Throat:     Mouth: Mucous membranes are dry.  Eyes:     General: No scleral icterus. Cardiovascular:     Rate and Rhythm: Normal rate and regular rhythm.     Pulses: Normal pulses.     Heart sounds: Normal heart sounds.  Pulmonary:     Effort: Pulmonary effort is normal. No respiratory distress.     Breath sounds: No wheezing.  Abdominal:     Palpations:  Abdomen is soft.     Tenderness: There is no abdominal tenderness. There is no guarding or rebound.  Musculoskeletal:     Cervical back: Normal range of motion.     Right lower leg: Edema present.     Left lower leg: Edema present.     Comments: Left lower extremity slightly enlarged from right  Skin:    General: Skin is warm and dry.     Capillary Refill: Capillary refill takes less than 2 seconds.  Neurological:     Mental Status: He is alert. Mental status is at baseline.  Psychiatric:        Mood and Affect: Mood normal.        Behavior: Behavior normal.     ED Results / Procedures / Treatments   Labs (all labs ordered are listed, but only abnormal results are displayed) Labs Reviewed  PROTIME-INR - Abnormal; Notable for the following components:      Result Value   Prothrombin Time 32.8 (*)    INR 3.2 (*)    All other components within normal limits  COMPREHENSIVE METABOLIC PANEL - Abnormal; Notable for the following components:   Sodium 151 (*)    CO2 33 (*)    Glucose, Bld 340 (*)    BUN 53 (*)    Creatinine, Ser 2.06 (*)    Albumin 2.9 (*)    GFR, Estimated 29 (*)    All other components within normal limits  BRAIN NATRIURETIC PEPTIDE - Abnormal; Notable for the following components:   B Natriuretic Peptide 360.4 (*)    All other components within normal limits  CBC WITH DIFFERENTIAL/PLATELET - Abnormal; Notable for the following components:   MCHC 29.7 (*)    RDW 16.8 (*)    Neutro Abs 8.2 (*)    Lymphs Abs 0.5 (*)    All other components within normal limits  I-STAT CHEM 8, ED - Abnormal; Notable for the following components:   Sodium 155 (*)    BUN 53 (*)    Creatinine, Ser 2.00 (*)    Glucose, Bld 340 (*)    TCO2 33 (*)    All other components within normal limits  I-STAT VENOUS BLOOD GAS, ED - Abnormal; Notable for the following components:   pCO2, Ven 61.1 (*)    Bicarbonate 37.6 (*)    TCO2 39 (*)    Acid-Base Excess 10.0 (*)    Sodium 153 (*)     Calcium, Ion 1.14 (*)    All other components within normal limits  TROPONIN I (HIGH SENSITIVITY) - Abnormal; Notable for the following components:   Troponin I (High Sensitivity) 38 (*)    All other components within  normal limits  TROPONIN I (HIGH SENSITIVITY) - Abnormal; Notable for the following components:   Troponin I (High Sensitivity) 35 (*)    All other components within normal limits  MAGNESIUM    EKG None  Radiology CT HEAD WO CONTRAST ( )  Result Date: 03/08/2022 CLINICAL DATA:  Mental status change, unknown cause EXAM: CT HEAD WITHOUT CONTRAST TECHNIQUE: Contiguous axial images were obtained from the base of the skull through the vertex without intravenous contrast. RADIATION DOSE REDUCTION: This exam was performed according to the departmental dose-optimization program which includes automated exposure control, adjustment of the mA and/or kV according to patient size and/or use of iterative reconstruction technique. COMPARISON:  10/14/2016 FINDINGS: Brain: Moderate parenchymal volume loss is commensurate with the patient's age appears slightly progressive since prior examination. Moderate periventricular white matter changes are present likely reflecting the sequela of small vessel ischemia, also progressive since prior exam. Remote lacunar infarct within the right putamen and remote cortical infarcts within the right parietal and occipital lobes are seen, new since prior examination. No evidence of acute intracranial hemorrhage or infarct. No abnormal mass effect or midline shift. No abnormal intra or extra-axial mass lesion or fluid collection. Ventricular size is normal. Cerebellum is unremarkable. Vascular: No hyperdense vessel or unexpected calcification. Skull: Normal. Negative for fracture or focal lesion. Sinuses/Orbits: There is mucosal thickening and lobulated soft tissue demonstrating central calcification within the maxillary and sphenoid sinuses in keeping with changes  of chronic sinusitis. Remaining paranasal sinuses are clear. Orbits are unremarkable. Other: Mastoid air cells and middle ear cavities are clear. IMPRESSION: 1. No acute intracranial abnormality. No calvarial fracture. 2. Progressive senescent changes and remote infarcts as outlined above. 3. Chronic left maxillary and sphenoid sinusitis. Electronically Signed   By: Helyn Numbers M.D.   On: 03/08/2022 01:56   DG Chest Portable 1 View  Result Date: 03/07/2022 CLINICAL DATA:  Shortness of breath with hypoxia. EXAM: PORTABLE CHEST 1 VIEW COMPARISON:  Chest x-ray 03/01/2022.  Chest CT 03/02/2022. FINDINGS: The heart is enlarged, unchanged. Sternotomy wires, prosthetic heart valve and left-sided pacemaker are again seen. There is central pulmonary vascular congestion. There is some patchy opacities in the left lung base with small left pleural effusion. There is no pneumothorax or acute fracture. Left diaphragmatic hernia again seen. IMPRESSION: 1. Cardiomegaly with central pulmonary vascular congestion. 2. Patchy airspace opacities in the left lung base with small left pleural effusion. Electronically Signed   By: Darliss Cheney M.D.   On: 03/07/2022 23:22    Procedures .Critical Care  Performed by: Gailen Shelter, PA Authorized by: Gailen Shelter, PA   Critical care provider statement:    Critical care time (minutes):  35   Critical care time was exclusive of:  Separately billable procedures and treating other patients and teaching time   Critical care was necessary to treat or prevent imminent or life-threatening deterioration of the following conditions:  Respiratory failure   Critical care was time spent personally by me on the following activities:  Development of treatment plan with patient or surrogate, review of old charts, re-evaluation of patient's condition, pulse oximetry, ordering and review of radiographic studies, ordering and review of laboratory studies, ordering and performing  treatments and interventions, obtaining history from patient or surrogate, examination of patient and evaluation of patient's response to treatment   Care discussed with: admitting provider       Medications Ordered in ED Medications  lactated ringers infusion ( Intravenous New Bag/Given 03/08/22 0030)  ED Course/ Medical Decision Making/ A&P Clinical Course as of 03/08/22 0334  Mon Mar 07, 2022  2228 Pt was discharged yesterday to SNF for rehab -- became weaker and more confused per staff and children. Brought in by EMS and found to be hypoxic at 88% on room air.  [WF]  Tue Mar 08, 2022  0206 CT head without acute changes.  IMPRESSION: 1. No acute intracranial abnormality. No calvarial fracture. 2. Progressive senescent changes and remote infarcts as outlined above. 3. Chronic left maxillary and sphenoid sinusitis.   Electronically Signed   By: Helyn Numbers M.D.   On: 03/08/2022 01:56   [WF]    Clinical Course User Index [WF] Gailen Shelter, PA                           Medical Decision Making Amount and/or Complexity of Data Reviewed Labs: ordered. Radiology: ordered.  Risk Prescription drug management. Decision regarding hospitalization.   This patient presents to the ED for concern of weakness, fatigue, hypoxia, this involves a number of treatment options, and is a complaint that carries with it a high risk of complications and morbidity. A differential diagnosis was considered for the patient's symptoms which is discussed below:   The differential diagnosis of weakness includes but is not limited to neurologic causes (GBS, myasthenia gravis, CVA, MS, ALS, transverse myelitis, spinal cord injury, CVA, botulism, ) and other causes: ACS, Arrhythmia, syncope, orthostatic hypotension, sepsis, hypoglycemia, electrolyte disturbance, hypothyroidism, respiratory failure, symptomatic anemia, dehydration, heat injury, polypharmacy, malignancy.    Co  morbidities: Discussed in HPI   Brief History:  Patient is a 89 year old male with past medical history significant for A-fib on warfarin, pacemaker for symptomatic bradycardia, status post AV valve replacement, CABG, CKD, reflux, DM2   His daughter and son are at bedside who provide additional history as patient is weak and somewhat confused.  Seems that patient was discharged yesterday from this hospital after admission for respiratory failure with hypoxia thought to be secondary to parainfluenza virus.  He was found to become weaker and more confused per staff at skilled nursing facility/rehab facility and was brought via via EMS to emergency room where on EMS arrival they were found to be 88% on room air and placed on 2 L nasal cannula oxygen.  Patient denies any pain other than left shoulder pain which seems to have been bothering him for quite some time.  He denies any hemoptysis.  Seems that he has not eaten very much or drank much since he was discharged from the hospital.    EMR reviewed including pt PMHx, past surgical history and past visits to ER.   See HPI for more details   Lab Tests:   I ordered and independently interpreted labs. Labs notable for Hyponatremic with sodium of 151 patient is clearly dehydrated with BUN of 53 creatinine elevated now at 2 discharged 1.4.  Blood sugar elevated at 340.  INR 3.2.  CBC without leukocytosis or anemia.  BNP at baseline, magnesium within normal limits.  Troponins flat at his baseline.  Imaging Studies:  Abnormal findings. I personally reviewed all imaging studies. Imaging notable for Pleural effusions.  No focal infiltrate.  Some patchy abnormal findings consistent with recent viral illness.   Cardiac Monitoring:  The patient was maintained on a cardiac monitor.  I personally viewed and interpreted the cardiac monitored which showed an underlying rhythm of: paced rhythm EKG non-ischemic paced  Medicines ordered:  I  ordered medication including gentle hydration  for dehydration/aki Reevaluation of the patient after these medicines showed that the patient stayed the same I have reviewed the patients home medicines and have made adjustments as needed    Critical Interventions:   administration of oxygen for hypoxia   Consults/Attending Physician   I requested consultation with Dr. Loney Loh,  and discussed lab and imaging findings as well as pertinent plan - they recommend: will admit   Reevaluation:  After the interventions noted above I re-evaluated patient and found that they have :stayed the same   Social Determinants of Health:      Problem List / ED Course:  Patient has remaining hypoxic but saturations normal on 2 L nasal cannula.  Recent viral illness.  Recent workup for PE which was negative.  Patient seems to be quite dehydrated.  Gentle hydration admission. Confusion likely uremic in the setting of dehydration elevated BUN.  CT head without any acute abnormal findings but does show chronic changes slightly worsened from previous study in 2018   Dispostion:  After consideration of the diagnostic results and the patients response to treatment, I feel that the patent would benefit from admission   Final Clinical Impression(s) / ED Diagnoses Final diagnoses:  AKI (acute kidney injury) (HCC)  Failure to thrive in adult  Dehydration  Hypoxia  Uremia    Rx / DC Orders ED Discharge Orders     None         Gailen Shelter, [place] 03/08/22 0334    Gailen Shelter, PA 03/08/22 0335    Maia Plan, MD 03/08/22 0401

## 2022-03-07 NOTE — ED Provider Notes (Incomplete)
[facility] EMERGENCY DEPARTMENT Provider Note   CSN: [id number] Arrival date & time: 03/07/22  2143     History {Add pertinent medical, surgical, social history, OB history to HPI:1} Chief Complaint  Patient presents with  . Shortness of Breath    [name] is a 89 y.o. male.   Shortness of Breath  Patient is a 89 year old male with past medical history significant for A-fib on warfarin, pacemaker for symptomatic bradycardia, status post AV valve replacement, CABG, CKD, reflux, DM2   His daughter and son are at bedside who provide additional history as patient is weak and somewhat confused.  Seems that patient was discharged yesterday from this hospital after admission for respiratory failure with hypoxia thought to be secondary to parainfluenza virus.  He was found to become weaker and more confused per staff at skilled nursing facility/rehab facility and was brought via via EMS to emergency room where on EMS arrival they were found to be 88% on room air and placed on 2 L nasal cannula oxygen.  Patient denies any pain other than left shoulder pain which seems to have been bothering him for quite some time.  He denies any hemoptysis.  Seems that he has not eaten very much or drank much since he was discharged from the hospital.      Home Medications Prior to Admission medications   Medication Sig Start Date End Date Taking? Authorizing Provider  acetaminophen (TYLENOL) 325 MG tablet Take 325-650 mg by mouth every 6 (six) hours as needed for mild pain or headache.    [provider]  amoxicillin (AMOXIL) 500 MG tablet Take 4 tablets by mouth 30-60 minutes prior to dental appointments 11/12/21   Jettie Booze, MD  Coenzyme Q10 (CO Q-10) 100 MG CAPS Take 1 capsule by mouth daily.    [provider]  Elastic Bandages & Supports (V-2 HIGH COMPRESSION HOSE) MISC daily. 03/21/13   [provider]  famotidine (PEPCID) 20 MG  tablet Take 20 mg by mouth daily.    [provider]  glimepiride (AMARYL) 1 MG tablet Take 1 mg by mouth 2 (two) times daily.     [provider]  ketoconazole (NIZORAL) 2 % cream 1 application to affected area Patient not taking: Reported on 03/02/2022 04/21/10   [provider]  nitroGLYCERIN (NITROSTAT) 0.4 MG SL tablet Place 1 tablet (0.4 mg total) under the tongue every 5 (five) minutes x 3 doses as needed for chest pain. 10/05/12   Jettie Booze, MD  tamsulosin (FLOMAX) 0.4 MG CAPS capsule Take 1 capsule (0.4 mg total) by mouth daily. 12/30/14   Florencia Reasons, MD  torsemide (DEMADEX) 20 MG tablet Take 2 tablets (40 mg total) by mouth 2 (two) times daily. 09/13/21   Jettie Booze, MD  warfarin (COUMADIN) 2.5 MG tablet TAKE 1/2 A TABLET TO 1 TABLET BY MOUTH DAILY AS DIRECTED BY THE COUMADIN CLINIC. Patient taking differently: Take 1.25-2.5 mg by mouth See admin instructions. Take 1/2 a tablet to 1 tablet by mouth daily as directed by the coumadin clinic. 12/06/21   Jettie Booze, MD      Allergies    Demerol [meperidine], Meperidine hcl, Nasal spray, and Simvastatin    Review of Systems   Review of Systems  Respiratory:  Positive for shortness of breath.     Physical Exam Updated Vital Signs BP 133/89   Pulse (!) 58   Temp 98.5 F (36.9 C) (Oral)  Resp (!) 25   Wt 65.3 kg   SpO2 100%   BMI 20.66 kg/m  Physical Exam Vitals and nursing note reviewed.  Constitutional:      Appearance: He is ill-appearing.     Comments: 89 year old male appears unwell, grimacing  HENT:     Head: Normocephalic and atraumatic.     Nose: Nose normal.     Mouth/Throat:     Mouth: Mucous membranes are dry.  Eyes:     General: No scleral icterus. Cardiovascular:     Rate and Rhythm: Normal rate and regular rhythm.     Pulses: Normal pulses.     Heart sounds: Normal heart sounds.  Pulmonary:     Effort: Pulmonary effort is normal. No respiratory distress.      Breath sounds: No wheezing.  Abdominal:     Palpations: Abdomen is soft.     Tenderness: There is no abdominal tenderness. There is no guarding or rebound.  Musculoskeletal:     Cervical back: Normal range of motion.     Right lower leg: Edema present.     Left lower leg: Edema present.     Comments: Left lower extremity slightly enlarged from right  Skin:    General: Skin is warm and dry.     Capillary Refill: Capillary refill takes less than 2 seconds.  Neurological:     Mental Status: He is alert. Mental status is at baseline.  Psychiatric:        Mood and Affect: Mood normal.        Behavior: Behavior normal.     ED Results / Procedures / Treatments   Labs (all labs ordered are listed, but only abnormal results are displayed) Labs Reviewed  PROTIME-INR - Abnormal; Notable for the following components:      Result Value   Prothrombin Time 32.8 (*)    INR 3.2 (*)    All other components within normal limits  CBC WITH DIFFERENTIAL/PLATELET - Abnormal; Notable for the following components:   MCHC 29.7 (*)    RDW 16.8 (*)    Neutro Abs 8.2 (*)    Lymphs Abs 0.5 (*)    All other components within normal limits  I-STAT CHEM 8, ED - Abnormal; Notable for the following components:   Sodium 155 (*)    BUN 53 (*)    Creatinine, Ser 2.00 (*)    Glucose, Bld 340 (*)    TCO2 33 (*)    All other components within normal limits  I-STAT VENOUS BLOOD GAS, ED - Abnormal; Notable for the following components:   pCO2, Ven 61.1 (*)    Bicarbonate 37.6 (*)    TCO2 39 (*)    Acid-Base Excess 10.0 (*)    Sodium 153 (*)    Calcium, Ion 1.14 (*)    All other components within normal limits  COMPREHENSIVE METABOLIC PANEL  BRAIN NATRIURETIC PEPTIDE  MAGNESIUM  TROPONIN I (HIGH SENSITIVITY)    EKG None  Radiology DG Chest Portable 1 View  Result Date: 03/07/2022 CLINICAL DATA:  Shortness of breath with hypoxia. EXAM: PORTABLE CHEST 1 VIEW COMPARISON:  Chest x-ray 03/01/2022.   Chest CT 03/02/2022. FINDINGS: The heart is enlarged, unchanged. Sternotomy wires, prosthetic heart valve and left-sided pacemaker are again seen. There is central pulmonary vascular congestion. There is some patchy opacities in the left lung base with small left pleural effusion. There is no pneumothorax or acute fracture. Left diaphragmatic hernia again seen. IMPRESSION: 1. Cardiomegaly with central  pulmonary vascular congestion. 2. Patchy airspace opacities in the left lung base with small left pleural effusion. Electronically Signed   By: Ronney Asters M.D.   On: 03/07/2022 23:22    Procedures Procedures  {Document cardiac monitor, telemetry assessment procedure when appropriate:1}  Medications Ordered in ED Medications - No data to display  ED Course/ Medical Decision Making/ A&P Clinical Course as of 03/07/22 2356  Mon Mar 07, 2022  2228 Pt was discharged yesterday to SNF for rehab -- became weaker and more confused per staff and children. Brought in by EMS and found to be hypoxic at 88% on room air.  [WF]    Clinical Course User Index [WF] Tedd Sias, [place]                           Medical Decision Making Amount and/or Complexity of Data Reviewed Labs: ordered. Radiology: ordered.   This patient presents to the ED for concern of weakness, fatigue, hypoxia, this involves a number of treatment options, and is a complaint that carries with it a high risk of complications and morbidity. A differential diagnosis was considered for the patient's symptoms which is discussed below:   The differential diagnosis of weakness includes but is not limited to neurologic causes (GBS, myasthenia gravis, CVA, MS, ALS, transverse myelitis, spinal cord injury, CVA, botulism, ) and other causes: ACS, Arrhythmia, syncope, orthostatic hypotension, sepsis, hypoglycemia, electrolyte disturbance, hypothyroidism, respiratory failure, symptomatic anemia, dehydration, heat injury, polypharmacy,  malignancy.    Co morbidities: Discussed in HPI   Brief History:  Patient is a 89 year old male with past medical history significant for A-fib on warfarin, pacemaker for symptomatic bradycardia, status post AV valve replacement, CABG, CKD, reflux, DM2   His daughter and son are at bedside who provide additional history as patient is weak and somewhat confused.  Seems that patient was discharged yesterday from this hospital after admission for respiratory failure with hypoxia thought to be secondary to parainfluenza virus.  He was found to become weaker and more confused per staff at skilled nursing facility/rehab facility and was brought via via EMS to emergency room where on EMS arrival they were found to be 88% on room air and placed on 2 L nasal cannula oxygen.  Patient denies any pain other than left shoulder pain which seems to have been bothering him for quite some time.  He denies any hemoptysis.  Seems that he has not eaten very much or drank much since he was discharged from the hospital.    EMR reviewed including pt PMHx, past surgical history and past visits to ER.   See HPI for more details   Lab Tests:   {Blank single:19197::"I ordered and independently interpreted labs. Labs notable for","I personally reviewed all laboratory work and imaging. Metabolic panel without any acute abnormality specifically kidney function within normal limits and no significant electrolyte abnormalities. CBC without leukocytosis or significant anemia."}   Imaging Studies:  {Blank single:19197::"NAD. I personally reviewed all imaging studies and no acute abnormality found. I agree with radiology interpretation.","Abnormal findings. I personally reviewed all imaging studies. Imaging notable for","No imaging studies ordered for this patient"}    Cardiac Monitoring:  .{Blank single:19197::"The patient was maintained on a cardiac monitor.  I personally viewed and interpreted the cardiac  monitored which showed an underlying rhythm of:","NA"} .{Blank single:19197::"EKG non-ischemic","NA"}   Medicines ordered:  I ordered medication including ***  for *** Reevaluation of the patient after these medicines  showed that the patient {resolved/improved/worsened:23923::"improved"} I have reviewed the patients home medicines and have made adjustments as needed   Critical Interventions:  .***   Consults/Attending Physician   .{Blank single:19197::"I requested consultation with ***,  and discussed lab and imaging findings as well as pertinent plan - they recommend: ***","I discussed this case with my attending physician who cosigned this note including patient's presenting symptoms, physical exam, and planned diagnostics and interventions. Attending physician stated agreement with plan or made changes to plan which were implemented."}   Reevaluation:  After the interventions noted above I re-evaluated patient and found that they have :{resolved/improved/worsened:23923::"improved"}   Social Determinants of Health:  [name]{Blank single:19197::"Given cab voucher","Social work/case management involved","The patient's social determinants of health were a factor in the care of this patient"}    Problem List / ED Course:  ***   Dispostion:  After consideration of the diagnostic results and the patients response to treatment, I feel that the patent would benefit from ***       Final Clinical Impression(s) / ED Diagnoses Final diagnoses:  None    Rx / DC Orders ED Discharge Orders     None

## 2022-03-07 NOTE — ED Triage Notes (Signed)
Pt BIB EMS from [facility] for SOB and hypoxia. Per EMS, pt oxygen sat was 88% on RA on their arrival. Pt recently admitted for PNA. Pt a&ox2.

## 2022-03-08 ENCOUNTER — Emergency Department (HOSPITAL_COMMUNITY): Payer: Medicare Other

## 2022-03-08 ENCOUNTER — Encounter (HOSPITAL_COMMUNITY): Payer: Self-pay | Admitting: Internal Medicine

## 2022-03-08 DIAGNOSIS — E1122 Type 2 diabetes mellitus with diabetic chronic kidney disease: Secondary | ICD-10-CM | POA: Diagnosis present

## 2022-03-08 DIAGNOSIS — R4182 Altered mental status, unspecified: Secondary | ICD-10-CM | POA: Diagnosis present

## 2022-03-08 DIAGNOSIS — I2510 Atherosclerotic heart disease of native coronary artery without angina pectoris: Secondary | ICD-10-CM | POA: Diagnosis present

## 2022-03-08 DIAGNOSIS — R1310 Dysphagia, unspecified: Secondary | ICD-10-CM | POA: Diagnosis present

## 2022-03-08 DIAGNOSIS — Z952 Presence of prosthetic heart valve: Secondary | ICD-10-CM | POA: Diagnosis not present

## 2022-03-08 DIAGNOSIS — J189 Pneumonia, unspecified organism: Secondary | ICD-10-CM | POA: Diagnosis present

## 2022-03-08 DIAGNOSIS — Z66 Do not resuscitate: Secondary | ICD-10-CM | POA: Diagnosis present

## 2022-03-08 DIAGNOSIS — I959 Hypotension, unspecified: Secondary | ICD-10-CM | POA: Diagnosis not present

## 2022-03-08 DIAGNOSIS — K449 Diaphragmatic hernia without obstruction or gangrene: Secondary | ICD-10-CM | POA: Diagnosis not present

## 2022-03-08 DIAGNOSIS — E870 Hyperosmolality and hypernatremia: Secondary | ICD-10-CM | POA: Diagnosis present

## 2022-03-08 DIAGNOSIS — E860 Dehydration: Secondary | ICD-10-CM | POA: Diagnosis present

## 2022-03-08 DIAGNOSIS — E440 Moderate protein-calorie malnutrition: Secondary | ICD-10-CM | POA: Diagnosis present

## 2022-03-08 DIAGNOSIS — N189 Chronic kidney disease, unspecified: Secondary | ICD-10-CM | POA: Diagnosis not present

## 2022-03-08 DIAGNOSIS — Z7401 Bed confinement status: Secondary | ICD-10-CM | POA: Diagnosis not present

## 2022-03-08 DIAGNOSIS — Y95 Nosocomial condition: Secondary | ICD-10-CM | POA: Diagnosis present

## 2022-03-08 DIAGNOSIS — I6381 Other cerebral infarction due to occlusion or stenosis of small artery: Secondary | ICD-10-CM | POA: Diagnosis not present

## 2022-03-08 DIAGNOSIS — Z7189 Other specified counseling: Secondary | ICD-10-CM | POA: Diagnosis not present

## 2022-03-08 DIAGNOSIS — G9341 Metabolic encephalopathy: Secondary | ICD-10-CM | POA: Diagnosis present

## 2022-03-08 DIAGNOSIS — N400 Enlarged prostate without lower urinary tract symptoms: Secondary | ICD-10-CM | POA: Diagnosis present

## 2022-03-08 DIAGNOSIS — Z515 Encounter for palliative care: Secondary | ICD-10-CM | POA: Diagnosis not present

## 2022-03-08 DIAGNOSIS — R404 Transient alteration of awareness: Secondary | ICD-10-CM | POA: Diagnosis not present

## 2022-03-08 DIAGNOSIS — R627 Adult failure to thrive: Secondary | ICD-10-CM | POA: Diagnosis present

## 2022-03-08 DIAGNOSIS — N179 Acute kidney failure, unspecified: Secondary | ICD-10-CM | POA: Diagnosis not present

## 2022-03-08 DIAGNOSIS — I129 Hypertensive chronic kidney disease with stage 1 through stage 4 chronic kidney disease, or unspecified chronic kidney disease: Secondary | ICD-10-CM | POA: Diagnosis present

## 2022-03-08 DIAGNOSIS — I442 Atrioventricular block, complete: Secondary | ICD-10-CM | POA: Diagnosis present

## 2022-03-08 DIAGNOSIS — I4821 Permanent atrial fibrillation: Secondary | ICD-10-CM | POA: Diagnosis not present

## 2022-03-08 DIAGNOSIS — I2721 Secondary pulmonary arterial hypertension: Secondary | ICD-10-CM | POA: Diagnosis present

## 2022-03-08 DIAGNOSIS — N184 Chronic kidney disease, stage 4 (severe): Secondary | ICD-10-CM | POA: Diagnosis present

## 2022-03-08 DIAGNOSIS — R64 Cachexia: Secondary | ICD-10-CM | POA: Diagnosis present

## 2022-03-08 DIAGNOSIS — J9601 Acute respiratory failure with hypoxia: Secondary | ICD-10-CM | POA: Diagnosis present

## 2022-03-08 DIAGNOSIS — Z951 Presence of aortocoronary bypass graft: Secondary | ICD-10-CM | POA: Diagnosis not present

## 2022-03-08 DIAGNOSIS — J9600 Acute respiratory failure, unspecified whether with hypoxia or hypercapnia: Secondary | ICD-10-CM | POA: Diagnosis not present

## 2022-03-08 DIAGNOSIS — E1151 Type 2 diabetes mellitus with diabetic peripheral angiopathy without gangrene: Secondary | ICD-10-CM | POA: Diagnosis present

## 2022-03-08 DIAGNOSIS — N1832 Chronic kidney disease, stage 3b: Secondary | ICD-10-CM | POA: Diagnosis not present

## 2022-03-08 DIAGNOSIS — Z7901 Long term (current) use of anticoagulants: Secondary | ICD-10-CM | POA: Diagnosis not present

## 2022-03-08 LAB — COMPREHENSIVE METABOLIC PANEL
ALT: 16 U/L (ref 0–44)
AST: 22 U/L (ref 15–41)
Albumin: 2.9 g/dL — ABNORMAL LOW (ref 3.5–5.0)
Alkaline Phosphatase: 59 U/L (ref 38–126)
Anion gap: 13 (ref 5–15)
BUN: 53 mg/dL — ABNORMAL HIGH (ref 8–23)
CO2: 33 mmol/L — ABNORMAL HIGH (ref 22–32)
Calcium: 9 mg/dL (ref 8.9–10.3)
Chloride: 105 mmol/L (ref 98–111)
Creatinine, Ser: 2.06 mg/dL — ABNORMAL HIGH (ref 0.61–1.24)
GFR, Estimated: 29 mL/min — ABNORMAL LOW (ref >60)
Glucose, Bld: 340 mg/dL — ABNORMAL HIGH (ref 70–99)
Potassium: 3.8 mmol/L (ref 3.5–5.1)
Sodium: 151 mmol/L — ABNORMAL HIGH (ref 135–145)
Total Bilirubin: 1.1 mg/dL (ref 0.3–1.2)
Total Protein: 7.4 g/dL (ref 6.5–8.1)

## 2022-03-08 LAB — BRAIN NATRIURETIC PEPTIDE: B Natriuretic Peptide: 360.4 pg/mL — ABNORMAL HIGH (ref 0.0–100.0)

## 2022-03-08 LAB — MAGNESIUM: Magnesium: 2.4 mg/dL (ref 1.7–2.4)

## 2022-03-08 LAB — CBG MONITORING, ED
Glucose-Capillary: 242 mg/dL — ABNORMAL HIGH (ref 70–99)
Glucose-Capillary: 187 mg/dL — ABNORMAL HIGH (ref 70–99)

## 2022-03-08 LAB — PROCALCITONIN: Procalcitonin: 0.52 ng/mL

## 2022-03-08 LAB — TROPONIN I (HIGH SENSITIVITY)
Troponin I (High Sensitivity): 35 ng/L — ABNORMAL HIGH (ref <18)
Troponin I (High Sensitivity): 38 ng/L — ABNORMAL HIGH (ref <18)

## 2022-03-08 MED ORDER — GUAIFENESIN ER 600 MG PO TB12: 600.0000 mg | ORAL_TABLET | Freq: Two times a day (BID) | ORAL | Status: DC | PRN

## 2022-03-08 MED ORDER — BISACODYL 5 MG PO TBEC: 5.0000 mg | DELAYED_RELEASE_TABLET | Freq: Every day | ORAL | Status: DC | PRN

## 2022-03-08 MED ORDER — WARFARIN SODIUM 1 MG PO TABS
1.0000 mg | ORAL_TABLET | Freq: Once | ORAL | Status: DC
  Filled 2022-03-08: qty 1

## 2022-03-08 MED ORDER — OXYCODONE HCL 5 MG PO TABS: 5.0000 mg | ORAL_TABLET | ORAL | Status: DC | PRN

## 2022-03-08 MED ORDER — ACETAMINOPHEN 325 MG PO TABS: 650.0000 mg | ORAL_TABLET | Freq: Four times a day (QID) | ORAL | Status: DC | PRN

## 2022-03-08 MED ORDER — MORPHINE SULFATE (PF) 2 MG/ML IV SOLN
2.0000 mg | INTRAVENOUS | Status: DC | PRN
  Administered 2022-03-08 – 2022-03-10 (×5): 2 mg via INTRAVENOUS
  Filled 2022-03-08 (×5): qty 1

## 2022-03-08 MED ORDER — VANCOMYCIN HCL 1500 MG/300ML IV SOLN
1500.0000 mg | Freq: Once | INTRAVENOUS | Status: AC
  Administered 2022-03-08: 1500 mg via INTRAVENOUS
  Filled 2022-03-08: qty 300

## 2022-03-08 MED ORDER — INSULIN ASPART 100 UNIT/ML IJ SOLN
0.0000 [IU] | Freq: Three times a day (TID) | INTRAMUSCULAR | Status: DC
  Administered 2022-03-08: 3 [IU] via SUBCUTANEOUS
  Administered 2022-03-08 – 2022-03-09 (×3): 2 [IU] via SUBCUTANEOUS
  Administered 2022-03-09: 3 [IU] via SUBCUTANEOUS
  Administered 2022-03-10 (×2): 5 [IU] via SUBCUTANEOUS
  Administered 2022-03-10: 3 [IU] via SUBCUTANEOUS

## 2022-03-08 MED ORDER — ONDANSETRON HCL 4 MG/2ML IJ SOLN: 4.0000 mg | Freq: Four times a day (QID) | INTRAMUSCULAR | Status: DC | PRN

## 2022-03-08 MED ORDER — SODIUM CHLORIDE 0.9 % IV SOLN
2.0000 g | INTRAVENOUS | Status: DC
  Administered 2022-03-08 – 2022-03-10 (×3): 2 g via INTRAVENOUS
  Filled 2022-03-08 (×3): qty 12.5

## 2022-03-08 MED ORDER — ALBUTEROL SULFATE (2.5 MG/3ML) 0.083% IN NEBU: 2.5000 mg | INHALATION_SOLUTION | RESPIRATORY_TRACT | Status: DC | PRN

## 2022-03-08 MED ORDER — HYDRALAZINE HCL 20 MG/ML IJ SOLN: 5.0000 mg | INTRAMUSCULAR | Status: DC | PRN

## 2022-03-08 MED ORDER — ACETAMINOPHEN 650 MG RE SUPP: 650.0000 mg | Freq: Four times a day (QID) | RECTAL | Status: DC | PRN

## 2022-03-08 MED ORDER — SODIUM CHLORIDE 0.9% FLUSH
3.0000 mL | Freq: Two times a day (BID) | INTRAVENOUS | Status: DC
  Administered 2022-03-08 – 2022-03-12 (×8): 3 mL via INTRAVENOUS

## 2022-03-08 MED ORDER — SODIUM CHLORIDE 0.9 % IV SOLN: INTRAVENOUS | Status: DC

## 2022-03-08 MED ORDER — ONDANSETRON HCL 4 MG PO TABS: 4.0000 mg | ORAL_TABLET | Freq: Four times a day (QID) | ORAL | Status: DC | PRN

## 2022-03-08 MED ORDER — DOCUSATE SODIUM 100 MG PO CAPS
100.0000 mg | ORAL_CAPSULE | Freq: Two times a day (BID) | ORAL | Status: DC
  Filled 2022-03-08: qty 1

## 2022-03-08 MED ORDER — WARFARIN - PHARMACIST DOSING INPATIENT: Freq: Every day | Status: DC

## 2022-03-08 MED ORDER — LACTATED RINGERS IV SOLN: INTRAVENOUS | Status: DC

## 2022-03-08 MED ORDER — POLYETHYLENE GLYCOL 3350 17 G PO PACK: 17.0000 g | PACK | Freq: Every day | ORAL | Status: DC | PRN

## 2022-03-08 MED ORDER — VANCOMYCIN VARIABLE DOSE PER UNSTABLE RENAL FUNCTION (PHARMACIST DOSING): Status: DC

## 2022-03-08 NOTE — ED Notes (Signed)
Obtained verification from Aniwa MD that she does want cultures to be collected if possible despite abx already being given. Notified MD that pt is a hard stick but we would try to obtain cultures.

## 2022-03-08 NOTE — ED Notes (Signed)
Called to have procal added to previous sample

## 2022-03-08 NOTE — ED Notes (Signed)
Pt repositioned in bed onto his R side. Small skin tear noted to L upper arm. There is also a dressing on his R upper arm.

## 2022-03-08 NOTE — Progress Notes (Signed)
ANTICOAGULATION CONSULT NOTE - Initial Consult  Pharmacy Consult for Warfarin Indication: atrial fibrillation and bioprosthetic AVR  Allergies  Allergen Reactions   Demerol [Meperidine] Other (See Comments)    Causes hallucinations   Meperidine Hcl Other (See Comments)   Nasal Spray Other (See Comments)   Simvastatin Other (See Comments)    Patient Measurements: Weight: 65.3 kg (144 lb)  Vital Signs: Temp: 98.9 F (37.2 C) (01/09 0649) Temp Source: Axillary (01/09 0649) BP: 134/59 (01/09 1000) Pulse Rate: 59 (01/09 1000)  Labs: Recent Labs    03/06/22 0217 03/07/22 2307 03/07/22 2321 03/08/22 0021  HGB  --  13.3 15.3  14.6  --   HCT  --  44.8 45.0  43.0  --   PLT  --  289  --   --   LABPROT 29.5* 32.8*  --   --   INR 2.8* 3.2*  --   --   CREATININE 1.47* 2.06* 2.00*  --   TROPONINIHS  --  38*  --  35*    Estimated Creatinine Clearance: 20.9 mL/min (A) (by C-G formula based on SCr of 2 mg/dL (H)).   Medical History: Past Medical History:  Diagnosis Date   Aortic stenosis    BPH (benign prostatic hypertrophy)    Coronary artery disease    Dermatophytosis of nail    Diabetes mellitus without complication (HCC)    type 2   Diverticulosis    DVT (deep venous thrombosis) (HCC)    Gastroesophageal reflux disease    GERD (gastroesophageal reflux disease)    Gout    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    Kidney stones    PAH (pulmonary artery hypertension) ([place])    PAS 63 on ECHO 09/2012   Permanent atrial fibrillation (HCC)    PVD (peripheral vascular disease) (HCC)    Severe sepsis ([place]) 09/02/2016   Shortness of breath    Symptomatic bradycardia 09/28/2012   s/p [employer] pacemaker implanted by Dr Rayann Heman     Medications:  Scheduled:   docusate sodium  100 mg Oral BID   insulin aspart  0-9 Units Subcutaneous TID WC   sodium chloride flush  3 mL Intravenous Q12H    Assessment: 89 yo F BIB EMS on 03/07/22 from SNF for SOB and hypoxia. Pt was recently  admitted for PNA from 1/2-03/06/22. Pt has PMH of AFib (warfarin), bradycardia s/p PPM, aortic stenosis s/p bioprosthetic AVR, CABG, CKD, GERD, and T2DM. Pt has been initiated on broad-spectrum antibiotics with cefepime and vancomycin for pneumonia. Pharmacy has been consulted for warfarin dosing for bioprosthetic AVR + AFib.   Home regimen: warfarin 2.'5mg'$  qHS Last dose: warfarin 2.'5mg'$  03/06/22 at 21:00  INR 3.2 on admission Hgb 15.3, Plt 289 - stable No s/sx of bleeding noted by Dr. Lorin Mercy  Goal of Therapy:  INR 2-3 Monitor platelets by anticoagulation protocol: Yes   Plan:  Warfarin '1mg'$  x1 tonight Monitor daily INR, CBC, and for s/sx of bleeding daily  Luisa Hart, PharmD, BCPS Clinical Pharmacist 03/08/2022 10:56 AM   Please refer to [facility] for pharmacy phone number

## 2022-03-08 NOTE — ED Notes (Signed)
Ambulatory pulse ox maintained 96%

## 2022-03-08 NOTE — Progress Notes (Signed)
Pharmacy Antibiotic Note  [name] is a 89 y.o. male admitted on 03/07/2022 with pneumonia.  Pharmacy has been consulted for Cefepime and Vancomycin dosing.  WBC 9.4, afebrile RR 26 SCr 2.0 (baseline Scr ~1.4-1.6)  Plan: Initiate Cefepime 2g IV q24h Initiate loading dose of Vancomycin '1500mg'$  IV x 1, followed by  Vancomycin variable dosing per levels   > goal vancomycin trough level 15-20   > check levels pending renal function Check MRSA PCR Monitor daily CBC, temp, SCr, and for clinical signs of improvement  F/u cultures and de-escalate antibiotics as able   Weight: 65.3 kg (144 lb)  Temp (24hrs), Avg:98.5 F (36.9 C), Min:98.2 F (36.8 C), Max:98.9 F (37.2 C)  Recent Labs  Lab 03/01/22 2055 03/02/22 0825 03/03/22 0229 03/04/22 0411 03/06/22 0217 03/07/22 2307 03/07/22 2321  WBC 9.0  --  8.7 5.5  --  9.4  --   CREATININE 2.24*   < > 2.39* 1.71* 1.47* 2.06* 2.00*   < > = values in this interval not displayed.    Estimated Creatinine Clearance: 20.9 mL/min (A) (by C-G formula based on SCr of 2 mg/dL (H)).    Allergies  Allergen Reactions   Demerol [Meperidine] Other (See Comments)    Causes hallucinations   Meperidine Hcl Other (See Comments)   Nasal Spray Other (See Comments)   Simvastatin Other (See Comments)    Antimicrobials this admission: Cefepime 1/9 >>  Vancomycin 1/9 >>   Dose adjustments this admission: N/A  Microbiology results: 1/9 Sputum: pending  1/9 MRSA PCR: ordered  Thank you for allowing pharmacy to be a part of this patient's care.  Luisa Hart, PharmD, BCPS Clinical Pharmacist 03/08/2022 1:47 PM   Please refer to [facility] for pharmacy phone number

## 2022-03-08 NOTE — ED Notes (Signed)
Notified phlebotomy of need for culture collection

## 2022-03-08 NOTE — ED Notes (Signed)
ED TO INPATIENT HANDOFF REPORT  ED Nurse Name and Phone #: Loree Fee [phone]    S Name/Age/Gender [name] 89 y.o. male Room/Bed: 003C/003C  Code Status   Code Status: DNR  Home/SNF/Other Home Patient oriented to: self Is this baseline? Yes   Triage Complete: Triage complete  Chief Complaint HCAP (healthcare-associated pneumonia) [J18.9]  Triage Note Pt BIB EMS from [facility] for SOB and hypoxia. Per EMS, pt oxygen sat was 88% on RA on their arrival. Pt recently admitted for PNA. Pt a&ox2.   Allergies Allergies  Allergen Reactions   Demerol [Meperidine] Other (See Comments)    Causes hallucinations   Meperidine Hcl Other (See Comments)   Nasal Spray Other (See Comments)   Simvastatin Other (See Comments)    Level of Care/Admitting Diagnosis ED Disposition     ED Disposition  Admit   Condition  --   [place]: [facility] [[id number]]  Level of Care: Telemetry Medical [104]  May admit patient to [facility] or [facility] if equivalent level of care is available:: No  Covid Evaluation: Asymptomatic - no recent exposure (last 10 days) testing not required  Diagnosis: HCAP (healthcare-associated pneumonia) [834196]  Admitting Physician: Karmen Bongo [[id number]]  Attending Physician: Karmen Bongo [[id number]]  Certification:: I certify this patient will need inpatient services for at least 2 midnights  Estimated Length of Stay: 4          B Medical/Surgery History Past Medical History:  Diagnosis Date   Aortic stenosis    BPH (benign prostatic hypertrophy)    Coronary artery disease    Dermatophytosis of nail    Diabetes mellitus without complication ([place])    type 2   Diverticulosis    DVT (deep venous thrombosis) ([facility])    Gastroesophageal reflux disease    GERD (gastroesophageal reflux disease)    Gout    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    Kidney stones    PAH (pulmonary artery hypertension) ([place])     PAS 63 on ECHO 09/2012   Permanent atrial fibrillation ([place])    PVD (peripheral vascular disease) ([place])    Severe sepsis ([place]) 09/02/2016   Shortness of breath    Symptomatic bradycardia 09/28/2012   s/p Medtronic pacemaker implanted by Dr Rayann Heman    Past Surgical History:  Procedure Laterality Date   AORTIC VALVE REPLACEMENT  2006   BALLOON DILATION N/A 12/16/2013   Procedure: BALLOON DILATION;  Surgeon: Garlan Fair, MD;  Location: [facility] ENDOSCOPY;  Service: Endoscopy;  Laterality: N/A;   CORONARY ARTERY BYPASS GRAFT     ESOPHAGOGASTRODUODENOSCOPY N/A 12/16/2013   Procedure: ESOPHAGOGASTRODUODENOSCOPY (EGD);  Surgeon: Garlan Fair, MD;  Location: [facility];  Service: Endoscopy;  Laterality: N/A;   HERNIA REPAIR     PACEMAKER INSERTION  10-04-2012   Medtronic pacemaker implanted by Dr Rayann Heman for symptomatic bradycardia   PARAESOPHAGEAL HERNIA REPAIR     PERMANENT PACEMAKER INSERTION N/A 10/04/2012   Procedure: PERMANENT PACEMAKER INSERTION;  Surgeon: Thompson Grayer, MD;  Location: [facility] CATH LAB;  Service: Cardiovascular;  Laterality: N/A;   TEE WITHOUT CARDIOVERSION N/A 12/25/2014   Procedure: TRANSESOPHAGEAL ECHOCARDIOGRAM (TEE)   (inpatient at [facility]);  Surgeon: Skeet Latch, MD;  Location: [facility] ENDOSCOPY;  Service: Cardiovascular;  Laterality: N/A;     A IV Location/Drains/Wounds Patient Lines/Drains/Airways Status     Active Line/Drains/Airways     Name Placement date Placement time Site Days   Peripheral IV 03/07/22 18  G Left Antecubital 03/07/22  2318  Antecubital  1   Peripheral IV 03/08/22 20 G 1.25" Anterior;Right Forearm 03/08/22  1030  Forearm  less than 1   Wound / Incision (Open or Dehisced) 09/03/16 Other (Comment) Leg Right Redness lower thigh and over knee down shin  09/03/16  --  Leg  2012            Intake/Output Last 24 hours  Intake/Output Summary (Last 24 hours) at 03/08/2022 2341 Last data filed at 03/08/2022 2336 Gross per 24 hour  Intake 455.14 ml   Output 400 ml  Net 55.14 ml    Labs/Imaging Results for orders placed or performed during the hospital encounter of 03/07/22 (from the past 48 hour(s))  Protime-INR     Status: Abnormal   Collection Time: 03/07/22 11:07 PM  Result Value Ref Range   Prothrombin Time 32.8 (H) 11.4 - 15.2 seconds   INR 3.2 (H) 0.8 - 1.2    Comment: (NOTE) INR goal varies based on device and disease states. Performed at [facility], [place] [address] [place], [place] [address]   Comprehensive metabolic panel     Status: Abnormal   Collection Time: 03/07/22 11:07 PM  Result Value Ref Range   Sodium 151 (H) 135 - 145 mmol/L   Potassium 3.8 3.5 - 5.1 mmol/L   Chloride 105 98 - 111 mmol/L   CO2 33 (H) 22 - 32 mmol/L   Glucose, Bld 340 (H) 70 - 99 mg/dL    Comment: Glucose reference range applies only to samples taken after fasting for at least 8 hours.   BUN 53 (H) 8 - 23 mg/dL   Creatinine, Ser 2.06 (H) 0.61 - 1.24 mg/dL   Calcium 9.0 8.9 - 10.3 mg/dL   Total Protein 7.4 6.5 - 8.1 g/dL   Albumin 2.9 (L) 3.5 - 5.0 g/dL   AST 22 15 - 41 U/L   ALT 16 0 - 44 U/L   Alkaline Phosphatase 59 38 - 126 U/L   Total Bilirubin 1.1 0.3 - 1.2 mg/dL   GFR, Estimated 29 (L) >60 mL/min    Comment: (NOTE) Calculated using the CKD-EPI Creatinine Equation (2021)    Anion gap 13 5 - 15    Comment: Performed at [facility] [address], [place], [place] [address]  Brain natriuretic peptide     Status: Abnormal   Collection Time: 03/07/22 11:07 PM  Result Value Ref Range   B Natriuretic Peptide 360.4 (H) 0.0 - 100.0 pg/mL    Comment: Performed at [facility] [address], [place], [place] [address]  Troponin I (High Sensitivity)     Status: Abnormal   Collection Time: 03/07/22 11:07 PM  Result Value Ref Range   Troponin I (High Sensitivity) 38 (H) <18 ng/L    Comment: (NOTE) Elevated high sensitivity troponin I (hsTnI) values and significant  changes across serial measurements may  suggest ACS but many other  chronic and acute conditions are known to elevate hsTnI results.  Refer to the "Links" section for chest pain algorithms and additional  guidance. Performed at [facility], [place] [address], [place], [place] [address]   CBC with Differential     Status: Abnormal   Collection Time: 03/07/22 11:07 PM  Result Value Ref Range   WBC 9.4 4.0 - 10.5 K/uL   RBC 4.94 4.22 - 5.81 MIL/uL   Hemoglobin 13.3 13.0 - 17.0 g/dL   HCT 44.8  39.0 - 52.0 %   MCV 90.7 80.0 - 100.0 fL   MCH 26.9 26.0 - 34.0 pg   MCHC 29.7 (L) 30.0 - 36.0 g/dL   RDW 16.8 (H) 11.5 - 15.5 %   Platelets 289 150 - 400 K/uL   nRBC 0.0 0.0 - 0.2 %   Neutrophils Relative % 87 %   Neutro Abs 8.2 (H) 1.7 - 7.7 K/uL   Lymphocytes Relative 5 %   Lymphs Abs 0.5 (L) 0.7 - 4.0 K/uL   Monocytes Relative 7 %   Monocytes Absolute 0.7 0.1 - 1.0 K/uL   Eosinophils Relative 0 %   Eosinophils Absolute 0.0 0.0 - 0.5 K/uL   Basophils Relative 0 %   Basophils Absolute 0.0 0.0 - 0.1 K/uL   Immature Granulocytes 1 %   Abs Immature Granulocytes 0.05 0.00 - 0.07 K/uL    Comment: Performed at [facility] [address], [place], [place] [address]  Magnesium     Status: None   Collection Time: 03/07/22 11:07 PM  Result Value Ref Range   Magnesium 2.4 1.7 - 2.4 mg/dL    Comment: Performed at [facility], [place] [address] [place], [place] [address]  I-stat chem 8, ED (not at [facility], [facility] or [facility])     Status: Abnormal   Collection Time: 03/07/22 11:21 PM  Result Value Ref Range   Sodium 155 (H) 135 - 145 mmol/L   Potassium 3.9 3.5 - 5.1 mmol/L   Chloride 107 98 - 111 mmol/L   BUN 53 (H) 8 - 23 mg/dL   Creatinine, Ser 2.00 (H) 0.61 - 1.24 mg/dL   Glucose, Bld 340 (H) 70 - 99 mg/dL    Comment: Glucose reference range applies only to samples taken after fasting for at least 8 hours.   Calcium, Ion 1.16 1.15 - 1.40 mmol/L   TCO2 33 (H) 22 - 32 mmol/L   Hemoglobin 15.3 13.0 - 17.0 g/dL   HCT 45.0 39.0  - 52.0 %  I-Stat venous blood gas, ([facility] ED, [facility], [facility])     Status: Abnormal   Collection Time: 03/07/22 11:21 PM  Result Value Ref Range   pH, Ven 7.396 7.25 - 7.43   pCO2, Ven 61.1 (H) 44 - 60 mmHg   pO2, Ven 43 32 - 45 mmHg   Bicarbonate 37.6 (H) 20.0 - 28.0 mmol/L   TCO2 39 (H) 22 - 32 mmol/L   O2 Saturation 76 %   Acid-Base Excess 10.0 (H) 0.0 - 2.0 mmol/L   Sodium 153 (H) 135 - 145 mmol/L   Potassium 3.9 3.5 - 5.1 mmol/L   Calcium, Ion 1.14 (L) 1.15 - 1.40 mmol/L   HCT 43.0 39.0 - 52.0 %   Hemoglobin 14.6 13.0 - 17.0 g/dL   Sample type VENOUS   Troponin I (High Sensitivity)     Status: Abnormal   Collection Time: 03/08/22 12:21 AM  Result Value Ref Range   Troponin I (High Sensitivity) 35 (H) <18 ng/L    Comment: (NOTE) Elevated high sensitivity troponin I (hsTnI) values and significant  changes across serial measurements may suggest ACS but many other  chronic and acute conditions are known to elevate hsTnI results.  Refer to the "Links" section for chest pain algorithms and additional  guidance. Performed at [facility], [place] [address], [place], [place] [address]   Procalcitonin - Baseline     Status: None   Collection Time: 03/08/22 12:21 AM  Result  Value Ref Range   Procalcitonin 0.52 ng/mL    Comment:        Interpretation: PCT > 0.5 ng/mL and <= 2 ng/mL: Systemic infection (sepsis) is possible, but other conditions are known to elevate PCT as well. (NOTE)       Sepsis PCT Algorithm           Lower Respiratory Tract                                      Infection PCT Algorithm    ----------------------------     ----------------------------         PCT < 0.25 ng/mL                PCT < 0.10 ng/mL          Strongly encourage             Strongly discourage   discontinuation of antibiotics    initiation of antibiotics    ----------------------------     -----------------------------       PCT 0.25 - 0.50 ng/mL            PCT 0.10 - 0.25 ng/mL                OR       >80% decrease in PCT            Discourage initiation of                                            antibiotics      Encourage discontinuation           of antibiotics    ----------------------------     -----------------------------         PCT >= 0.50 ng/mL              PCT 0.26 - 0.50 ng/mL                AND       <80% decrease in PCT             Encourage initiation of                                             antibiotics       Encourage continuation           of antibiotics    ----------------------------     -----------------------------        PCT >= 0.50 ng/mL                  PCT > 0.50 ng/mL               AND         increase in PCT                  Strongly encourage                                      initiation of antibiotics    Strongly encourage escalation  of antibiotics                                     -----------------------------                                           PCT <= 0.25 ng/mL                                                 OR                                        > 80% decrease in PCT                                      Discontinue / Do not initiate                                             antibiotics  Performed at [facility], [place] [address] [place], [place] [address]   CBG monitoring, ED     Status: Abnormal   Collection Time: 03/08/22 12:14 PM  Result Value Ref Range   Glucose-Capillary 242 (H) 70 - 99 mg/dL    Comment: Glucose reference range applies only to samples taken after fasting for at least 8 hours.  CBG monitoring, ED     Status: Abnormal   Collection Time: 03/08/22  5:50 PM  Result Value Ref Range   Glucose-Capillary 187 (H) 70 - 99 mg/dL    Comment: Glucose reference range applies only to samples taken after fasting for at least 8 hours.   CT HEAD WO CONTRAST (5MM)  Result Date: 03/08/2022 CLINICAL DATA:  Mental status change, unknown cause EXAM: CT HEAD WITHOUT CONTRAST TECHNIQUE:  Contiguous axial images were obtained from the base of the skull through the vertex without intravenous contrast. RADIATION DOSE REDUCTION: This exam was performed according to the departmental dose-optimization program which includes automated exposure control, adjustment of the mA and/or kV according to patient size and/or use of iterative reconstruction technique. COMPARISON:  10/14/2016 FINDINGS: Brain: Moderate parenchymal volume loss is commensurate with the patient's age appears slightly progressive since prior examination. Moderate periventricular white matter changes are present likely reflecting the sequela of small vessel ischemia, also progressive since prior exam. Remote lacunar infarct within the right putamen and remote cortical infarcts within the right parietal and occipital lobes are seen, new since prior examination. No evidence of acute intracranial hemorrhage or infarct. No abnormal mass effect or midline shift. No abnormal intra or extra-axial mass lesion or fluid collection. Ventricular size is normal. Cerebellum is unremarkable. Vascular: No hyperdense vessel or unexpected calcification. Skull: Normal. Negative for fracture or focal lesion. Sinuses/Orbits: There is mucosal thickening and lobulated soft tissue demonstrating central calcification within the maxillary and sphenoid sinuses in keeping with changes of chronic sinusitis. Remaining paranasal sinuses are clear.  Orbits are unremarkable. Other: Mastoid air cells and middle ear cavities are clear. IMPRESSION: 1. No acute intracranial abnormality. No calvarial fracture. 2. Progressive senescent changes and remote infarcts as outlined above. 3. Chronic left maxillary and sphenoid sinusitis. Electronically Signed   By: Fidela Salisbury M.D.   On: 03/08/2022 01:56   DG Chest Portable 1 View  Result Date: 03/07/2022 CLINICAL DATA:  Shortness of breath with hypoxia. EXAM: PORTABLE CHEST 1 VIEW COMPARISON:  Chest x-ray 03/01/2022.  Chest CT  03/02/2022. FINDINGS: The heart is enlarged, unchanged. Sternotomy wires, prosthetic heart valve and left-sided pacemaker are again seen. There is central pulmonary vascular congestion. There is some patchy opacities in the left lung base with small left pleural effusion. There is no pneumothorax or acute fracture. Left diaphragmatic hernia again seen. IMPRESSION: 1. Cardiomegaly with central pulmonary vascular congestion. 2. Patchy airspace opacities in the left lung base with small left pleural effusion. Electronically Signed   By: Ronney Asters M.D.   On: 03/07/2022 23:22    Pending Labs Unresulted Labs (From admission, onward)     Start     Ordered   03/09/22 2536  Basic metabolic panel  Tomorrow morning,   R        03/08/22 0931   03/09/22 0500  CBC  Tomorrow morning,   R        03/08/22 0931   03/08/22 1751  Culture, blood (Routine X 2) w Reflex to ID Panel  BLOOD CULTURE X 2,   R      03/08/22 1750   03/08/22 0957  MRSA Next Gen by PCR, Nasal  (MRSA Screening)  ONCE - URGENT,   URGENT        03/08/22 0956   03/08/22 0929  Expectorated Sputum Assessment w Gram Stain, Rflx to Resp Cult  (COPD / Pneumonia / Cellulitis / Lower Extremity Wound)  Once,   R        03/08/22 0931            Vitals/Pain Today's Vitals   03/08/22 1830 03/08/22 2000 03/08/22 2039 03/08/22 2330  BP: 130/87 (!) 127/58  129/79  Pulse: 73 63  72  Resp: 17 (!) 21    Temp:   97.6 F (36.4 C)   TempSrc:   Oral   SpO2: 100% 99%  95%  Weight:      PainSc:        Isolation Precautions No active isolations  Medications Medications  0.9 %  sodium chloride infusion ( Intravenous Infusion Verify 03/08/22 1332)  acetaminophen (TYLENOL) tablet 650 mg (has no administration in time range)    Or  acetaminophen (TYLENOL) suppository 650 mg (has no administration in time range)  morphine (PF) 2 MG/ML injection 2 mg (2 mg Intravenous Given 03/08/22 1745)  docusate sodium (COLACE) capsule 100 mg (100 mg Oral Not  Given 03/08/22 2107)  polyethylene glycol (MIRALAX / GLYCOLAX) packet 17 g (has no administration in time range)  bisacodyl (DULCOLAX) EC tablet 5 mg (has no administration in time range)  ondansetron (ZOFRAN) tablet 4 mg (has no administration in time range)    Or  ondansetron (ZOFRAN) injection 4 mg (has no administration in time range)  albuterol (PROVENTIL) (2.5 MG/3ML) 0.083% nebulizer solution 2.5 mg (has no administration in time range)  guaiFENesin (MUCINEX) 12 hr tablet 600 mg (has no administration in time range)  hydrALAZINE (APRESOLINE) injection 5 mg (has no administration in time range)  insulin aspart (novoLOG) injection 0-9 Units (2  Units Subcutaneous Given 03/08/22 1758)  sodium chloride flush (NS) 0.9 % injection 3 mL (3 mLs Intravenous Given 03/08/22 2107)  oxyCODONE (Oxy IR/ROXICODONE) immediate release tablet 5 mg (has no administration in time range)  ceFEPIme (MAXIPIME) 2 g in sodium chloride 0.9 % 100 mL IVPB (0 g Intravenous Stopped 03/08/22 1253)  warfarin (COUMADIN) tablet 1 mg (1 mg Oral Not Given 03/08/22 1734)  Warfarin - Pharmacist Dosing Inpatient ( Does not apply Not Given 03/08/22 1525)  vancomycin variable dose per unstable renal function (pharmacist dosing) (has no administration in time range)  vancomycin (VANCOREADY) IVPB 1500 mg/300 mL (0 mg Intravenous Stopped 03/08/22 1556)    Mobility non-ambulatory High fall risk   Focused Assessments Pulmonary Assessment Handoff:  Lung sounds: Bilateral Breath Sounds: Diminished O2 Device: Nasal Cannula O2 Flow Rate (L/min): 2 L/min    R Recommendations: See Admitting Provider Note  Report given to:   Additional Notes:

## 2022-03-08 NOTE — ED Notes (Signed)
Bladder scan  300ml 

## 2022-03-08 NOTE — ED Notes (Signed)
Fluids noted to not be infusing upon taking over pt care.

## 2022-03-08 NOTE — ED Notes (Signed)
Pt stating he needs to urinate, but when trying he starts to scream in pain and unable to urinate. Contacted MD chen and order for bladder scan and in and out to be ordered dependant on bladder scan volume.

## 2022-03-08 NOTE — H&P (Signed)
History and Physical    Patient: [name] NAT:[id number] DOB: [birth year] DOA: 03/07/2022 DOS: the patient was seen and examined on 03/08/2022 PCP: Wenda Low, MD  Patient coming from: [facility] for rehab, previously at home alone and driving, independent; NOK: Daughter, [phone]   Chief Complaint: SOB  HPI: [name] is a 89 y.o. male with medical history significant of CAD s/p CABG; DM; stage 4 CKD; and afib with pacemaker presenting with SOB.  He was last admitted from 1/2-7 with CAP and parainfluenza, treated with Rocephin/Azithromycin.  His daughter feels like he wasn't ready to go home - Saturday he was asking for water but not eating well.  Sunday, he was gray and much less responsive than usual.  The nurse called yesterday and said he was dying and family needed to come up.  He is having confusion, generalized weakness, cough productive of yucky sputum.  ?aspiration - has trouble with applesauce.  He did have a swallow eval last hospitalization, needed thicker liquids.  No fever.  He has chronic pedal edema.    ER Course:  Carryover, per Dr. Marlowe Sax:  89 year old with history of CAD status post CABG, paroxysmal A-fib, diabetes, CKD stage IV, CHB status post PPM discharged from the hospital 2 days ago after being treated for pneumonia and parainfluenza infection.  He was sent to SNF and there was not eating or drinking and sent back to the ED.  Labs showing sodium 151, creatinine 2.0 (previously 1.4).  Troponin in the 30s and stable.  BNP is in the 300s and chest x-ray showing pulmonary vascular congestion and patchy airspace opacities at the left lung base.  Hypoxic to the 80s and placed on 2 L.  CT head is negative.  He was started on gentle IV fluids due to concern for dehydration//AKI/rising sodium.      Review of Systems: unable to review all systems due to the inability of the patient to answer questions. Past Medical History:   Diagnosis Date   Aortic stenosis    BPH (benign prostatic hypertrophy)    Coronary artery disease    Dermatophytosis of nail    Diabetes mellitus without complication (HCC)    type 2   Diverticulosis    DVT (deep venous thrombosis) (HCC)    Gastroesophageal reflux disease    GERD (gastroesophageal reflux disease)    Gout    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    Kidney stones    PAH (pulmonary artery hypertension) ([place])    PAS 63 on ECHO 09/2012   Permanent atrial fibrillation (HCC)    PVD (peripheral vascular disease) (HCC)    Severe sepsis ([place]) 09/02/2016   Shortness of breath    Symptomatic bradycardia 09/28/2012   s/p Medtronic pacemaker implanted by Dr Rayann Heman    Past Surgical History:  Procedure Laterality Date   AORTIC VALVE REPLACEMENT  2006   BALLOON DILATION N/A 12/16/2013   Procedure: BALLOON DILATION;  Surgeon: Garlan Fair, MD;  Location: [facility] ENDOSCOPY;  Service: Endoscopy;  Laterality: N/A;   CORONARY ARTERY BYPASS GRAFT     ESOPHAGOGASTRODUODENOSCOPY N/A 12/16/2013   Procedure: ESOPHAGOGASTRODUODENOSCOPY (EGD);  Surgeon: Garlan Fair, MD;  Location: [facility];  Service: Endoscopy;  Laterality: N/A;   HERNIA REPAIR     PACEMAKER INSERTION  10-04-2012   Medtronic pacemaker implanted by Dr Rayann Heman for symptomatic bradycardia   PARAESOPHAGEAL HERNIA REPAIR     PERMANENT PACEMAKER INSERTION N/A 10/04/2012   Procedure: PERMANENT PACEMAKER  INSERTION;  Surgeon: Thompson Grayer, MD;  Location[facility] CATH LAB;  Service: Cardiovascular;  Laterality: N/A;   TEE WITHOUT CARDIOVERSION N/A 12/25/2014   Procedure: TRANSESOPHAGEAL ECHOCARDIOGRAM (TEE)   (inpatient at [facility]);  Surgeon: Skeet Latch, MD;  Location: [facility] ENDOSCOPY;  Service: Cardiovascular;  Laterality: N/A;   Social History:  reports that he has never smoked. He has never used smokeless tobacco. He reports that he does not drink alcohol and does not use drugs.  Allergies  Allergen Reactions   Demerol  [Meperidine] Other (See Comments)    Causes hallucinations   Meperidine Hcl Other (See Comments)   Nasal Spray Other (See Comments)   Simvastatin Other (See Comments)    Family History  Problem Relation Age of Onset   Cancer - Other Mother    Cancer - Other Father    CAD Neg Hx     Prior to Admission medications   Medication Sig Start Date End Date Taking? Authorizing Provider  acetaminophen (TYLENOL) 325 MG tablet Take 650 mg by mouth every 6 (six) hours as needed for mild pain or headache.   Yes [provider]  amoxicillin (AMOXIL) 500 MG tablet Take 4 tablets by mouth 30-60 minutes prior to dental appointments 11/12/21  Yes Jettie Booze, MD  Coenzyme Q10 (CO Q-10) 100 MG CAPS Take 1 capsule by mouth daily.   Yes [provider]  famotidine (PEPCID) 20 MG tablet Take 20 mg by mouth daily.   Yes [provider]  glimepiride (AMARYL) 1 MG tablet Take 1 mg by mouth 2 (two) times daily.    Yes [provider]  nitroGLYCERIN (NITROSTAT) 0.4 MG SL tablet Place 1 tablet (0.4 mg total) under the tongue every 5 (five) minutes x 3 doses as needed for chest pain. 10/05/12  Yes Jettie Booze, MD  sodium chloride 0.9 % infusion Inject 53m into the skin every shift   Yes [provider]  tamsulosin (FLOMAX) 0.4 MG CAPS capsule Take 1 capsule (0.4 mg total) by mouth daily. 12/30/14  Yes XFlorencia Reasons MD  torsemide (DEMADEX) 20 MG tablet Take 2 tablets (40 mg total) by mouth 2 (two) times daily. 09/13/21  Yes VJettie Booze MD  warfarin (COUMADIN) 2.5 MG tablet TAKE 1/2 A TABLET TO 1 TABLET BY MOUTH DAILY AS DIRECTED BY THE COUMADIN CLINIC. Patient taking differently: Take 2.5 mg by mouth at bedtime. Or as directed by the coumadin clinic. 12/06/21  Yes VJettie Booze MD  Elastic Bandages & Supports (V-2 HIGH COMPRESSION HOSE) MISC daily. 03/21/13   [provider]  ketoconazole (NIZORAL) 2 % cream 1 application to affected  area Patient not taking: Reported on 03/02/2022 04/21/10   [provider]    Physical Exam: Vitals:   03/08/22 1500 03/08/22 1530 03/08/22 1600 03/08/22 1630  BP: 110/60 (!) 117/54 (!) 103/55 (!) 115/51  Pulse: 67 64 65 66  Resp: 16 (!) '23 18 20  '$ Temp:      TempSrc:      SpO2: 97% 98% 100% 94%  Weight:       General:  Appears chronically ill and quite frail, cachectic Eyes:   EOMI, normal lids, iris ENT:  grossly normal but dry lips & tongue, dry mm Neck:  no LAD, masses or thyromegaly Cardiovascular:  RRR, no m/r/g. No LE edema.  Respiratory:   Diffuse rhonchi.  Mildly increased respiratory effort on  O2. Abdomen:  soft, NT, ND Skin:  no rash or induration seen  on limited exam Musculoskeletal: no bony abnormality Psychiatric:  flat mood and affect, speech mostly absent Neurologic: unable to effectively perform   Radiological Exams on Admission: Independently reviewed - see discussion in A/P where applicable  CT HEAD WO CONTRAST (5MM)  Result Date: 03/08/2022 CLINICAL DATA:  Mental status change, unknown cause EXAM: CT HEAD WITHOUT CONTRAST TECHNIQUE: Contiguous axial images were obtained from the base of the skull through the vertex without intravenous contrast. RADIATION DOSE REDUCTION: This exam was performed according to the departmental dose-optimization program which includes automated exposure control, adjustment of the mA and/or kV according to patient size and/or use of iterative reconstruction technique. COMPARISON:  10/14/2016 FINDINGS: Brain: Moderate parenchymal volume loss is commensurate with the patient's age appears slightly progressive since prior examination. Moderate periventricular white matter changes are present likely reflecting the sequela of small vessel ischemia, also progressive since prior exam. Remote lacunar infarct within the right putamen and remote cortical infarcts within the right parietal and occipital lobes are seen, new since prior  examination. No evidence of acute intracranial hemorrhage or infarct. No abnormal mass effect or midline shift. No abnormal intra or extra-axial mass lesion or fluid collection. Ventricular size is normal. Cerebellum is unremarkable. Vascular: No hyperdense vessel or unexpected calcification. Skull: Normal. Negative for fracture or focal lesion. Sinuses/Orbits: There is mucosal thickening and lobulated soft tissue demonstrating central calcification within the maxillary and sphenoid sinuses in keeping with changes of chronic sinusitis. Remaining paranasal sinuses are clear. Orbits are unremarkable. Other: Mastoid air cells and middle ear cavities are clear. IMPRESSION: 1. No acute intracranial abnormality. No calvarial fracture. 2. Progressive senescent changes and remote infarcts as outlined above. 3. Chronic left maxillary and sphenoid sinusitis. Electronically Signed   By: Fidela Salisbury M.D.   On: 03/08/2022 01:56   DG Chest Portable 1 View  Result Date: 03/07/2022 CLINICAL DATA:  Shortness of breath with hypoxia. EXAM: PORTABLE CHEST 1 VIEW COMPARISON:  Chest x-ray 03/01/2022.  Chest CT 03/02/2022. FINDINGS: The heart is enlarged, unchanged. Sternotomy wires, prosthetic heart valve and left-sided pacemaker are again seen. There is central pulmonary vascular congestion. There is some patchy opacities in the left lung base with small left pleural effusion. There is no pneumothorax or acute fracture. Left diaphragmatic hernia again seen. IMPRESSION: 1. Cardiomegaly with central pulmonary vascular congestion. 2. Patchy airspace opacities in the left lung base with small left pleural effusion. Electronically Signed   By: Ronney Asters M.D.   On: 03/07/2022 23:22    EKG: Independently reviewed.  Afib, vertincular paced with rate 64; prolonged Qtc  515   Labs on Admission: I have personally reviewed the available labs and imaging studies at the time of the admission.  Pertinent labs:    VBG:  7.396/61.1/37.6 Na++ 151 CO2 33 Glucose 340 BUN 53/Creatinine 2.06/GFR 29; 33/02/4742 on 1/7 Albumin 2.9 BNP 360.4 HS troponin 38, 35 Unremarkable CBC INR 3.2 Procalcitonin 0.52   Assessment and Plan: Principal Problem:   HCAP (healthcare-associated pneumonia) Active Problems:   Atrial fibrillation ([place])   Coronary artery disease   Pacemaker   Acute kidney injury superimposed on chronic kidney disease ([place])   Type 2 diabetes mellitus with vascular disease (HCC)   CKD (chronic kidney disease) stage 3, GFR 30-59 ml/min (HCC)   BPH (benign prostatic hyperplasia)   DNR (do not resuscitate)    HCAP -Recent admission for parainfluenza and CAP -He was treated with Rocephin/Azithro and eventually weaned to room air and discharged 2 days ago to SNF -  He persists with decreased responsiveness, minimal PO intake, cough -He had an episode of possible aspiration at the facility -CXR with LLL opacity, concerning for HCAP vs. Aspiration -For now, NPO and will need swallow evaluation prior to diet -Family has asked about possible Cortrak placement for nutrition but will hold for now and hydrate with abx -Will admit to telemetry  -Procalcitonin is mildly positive -Will cover for now with Cefepime and Vanc -Will consult palliative care based on recurrent admissions -Will check blood cultures  AKI on CKD 3-4  -Creatinine improved to 1.47 at the time of last discharge -Current creatinine is worse again -Suspect recurrent AKI -Hydrate and follow  AMS -Patient with acute metabolic encephalopathy (again) which is likely related to HCAP -Anticipate improvement with treatment  Afib -Rate controlled without medications -Continue Coumadin (when patient is able to take PO)  CAD -s/p CABG  DM -hold glimepride -Cover with sensitive-scale SSI   BPH -Hold tamsulosin for now  Goals of care -Family prefers to treat the treatable -He is DNR based on today's discussion with son and  daughter -Palliative care consulted    Advance Care Planning:   Code Status: DNR   Consults: Palliative care; RT; nutrition; TOC team; PT/OT/ST  DVT Prophylaxis: Coumadin  Family Communication: Son and daughter were present during evaluation  Severity of Illness: The appropriate patient status for this patient is INPATIENT. Inpatient status is judged to be reasonable and necessary in order to provide the required intensity of service to ensure the patient's safety. The patient's presenting symptoms, physical exam findings, and initial radiographic and laboratory data in the context of their chronic comorbidities is felt to place them at high risk for further clinical deterioration. Furthermore, it is not anticipated that the patient will be medically stable for discharge from the hospital within 2 midnights of admission.   * I certify that at the point of admission it is my clinical judgment that the patient will require inpatient hospital care spanning beyond 2 midnights from the point of admission due to high intensity of service, high risk for further deterioration and high frequency of surveillance required.*  Author: Karmen Bongo, MD 03/08/2022 5:49 PM  For on call review [email or web address].

## 2022-03-08 NOTE — ED Notes (Signed)
IV team at bedside 

## 2022-03-08 NOTE — ED Notes (Signed)
Pt oxygen sat dropped to 81% on RA. Pt placed back on 3L and oxygen sat increased to 93%.

## 2022-03-08 NOTE — ED Notes (Signed)
Phlebotomy at bedside attempting to obtain cultures

## 2022-03-08 NOTE — Evaluation (Signed)
Clinical/Bedside Swallow Evaluation Patient Details  Name: [name] MRN: [id number] Date of Birth: 02/27/1926  Today's Date: 03/08/2022 Time: SLP Start Time (ACUTE ONLY): 54 SLP Stop Time (ACUTE ONLY): 1440 SLP Time Calculation (min) (ACUTE ONLY): 33 min  Past Medical History:  Past Medical History:  Diagnosis Date   Aortic stenosis    BPH (benign prostatic hypertrophy)    Coronary artery disease    Dermatophytosis of nail    Diabetes mellitus without complication (HCC)    type 2   Diverticulosis    DVT (deep venous thrombosis) (HCC)    Gastroesophageal reflux disease    GERD (gastroesophageal reflux disease)    Gout    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    Kidney stones    PAH (pulmonary artery hypertension) ([place])    PAS 63 on ECHO 09/2012   Permanent atrial fibrillation (HCC)    PVD (peripheral vascular disease) (HCC)    Severe sepsis ([place]) 09/02/2016   Shortness of breath    Symptomatic bradycardia 09/28/2012   s/p Medtronic pacemaker implanted by Dr Rayann Heman    Past Surgical History:  Past Surgical History:  Procedure Laterality Date   AORTIC VALVE REPLACEMENT  2006   BALLOON DILATION N/A 12/16/2013   Procedure: BALLOON DILATION;  Surgeon: Garlan Fair, MD;  Location: [facility] ENDOSCOPY;  Service: Endoscopy;  Laterality: N/A;   CORONARY ARTERY BYPASS GRAFT     ESOPHAGOGASTRODUODENOSCOPY N/A 12/16/2013   Procedure: ESOPHAGOGASTRODUODENOSCOPY (EGD);  Surgeon: Garlan Fair, MD;  Location: [facility]OS[facility];  Service: Endoscopy;  Laterality: N/A;   HERNIA REPAIR     PACEMAKER INSERTION  10-04-2012   Medtronic pacemaker implanted by Dr Rayann Heman for symptomatic bradycardia   PARAESOPHAGEAL HERNIA REPAIR     PERMANENT PACEMAKER INSERTION N/A 10/04/2012   Procedure: PERMANENT PACEMAKER INSERTION;  Surgeon: Thompson Grayer, MD;  Location[facility] CATH LAB;  Service: Cardiovascular;  Laterality: N/A;   TEE WITHOUT CARDIOVERSION N/A 12/25/2014   Procedure: TRANSESOPHAGEAL  ECHOCARDIOGRAM (TEE)   (inpatient at [facility]);  Surgeon: Skeet Latch, MD;  Location: [facility] ENDOSCOPY;  Service: Cardiovascular;  Laterality: N/A;   HPI:  Pt is a 89 yo male presenting from SNF 1/8 with SOB and hypoxia. He was recently admitted for PNA 1/2-1/7, with MBS completed 1/5 revealing oropharyngeal dysphagia and aspiration of thin and nectar thick liquids. He was discharged on Dys 2 diet and honey thick liquids. PMH includes: AFib (warfarin), bradycardia s/p PPM, aortic stenosis s/p bioprosthetic AVR, CABG, CKD, GERD, and T2DM    Assessment / Plan / Recommendation  Clinical Impression  Pt's daughter reports a baseline of esophageal dysphagia requiring stretching but eating well until recent hospitalization associated wtih HCAP. MBS 1/5 revealed oropharyngeal dysphagia including aspiration of thin and nectar thick liquids. Dys 2 (fine chop) and honey thick diet was recommended but she noted trouble with this as well the night PTA. Pain meds administered shortly before the session resulted in lethargy, impacting performance on oral motor exam. Pt presents with poor dentition, dried secretions, and a dry lingual coating. When preforming oral care, pt was provided small amounts of water on a swab that he only responded to intermittently when prompted. The pt did not respond to commands to swallow. Due to his drowsiness, no other POs were provided to pt. Recommend NPO at this time considering progressive signs of dysphagia noted by family as well as current lethargy. Would focus on oral care today and f/u on subsequent date when he is more optimally  alert. SLP Visit Diagnosis: Dysphagia, unspecified (R13.10)    Aspiration Risk  Severe aspiration risk    Diet Recommendation NPO   Medication Administration: Via alternative means    Other  Recommendations Oral Care Recommendations: Oral care QID    Recommendations for follow up therapy are one component of a multi-disciplinary discharge planning  process, led by the attending physician.  Recommendations may be updated based on patient status, additional functional criteria and insurance authorization.  Follow up Recommendations Skilled nursing-short term rehab (<3 hours/day)      Assistance Recommended at Discharge    Functional Status Assessment Patient has had a recent decline in their functional status and/or demonstrates limited ability to make significant improvements in function in a reasonable and predictable amount of time  Frequency and Duration min 2x/week  2 weeks       Prognosis Prognosis for Safe Diet Advancement: Fair Barriers to Reach Goals: Severity of deficits;Time post onset      Swallow Study   General HPI: Pt is a 89 yo male presenting from SNF 1/8 with SOB and hypoxia. He was recently admitted for PNA 1/2-1/7, with MBS completed 1/5 revealing oropharyngeal dysphagia and aspiration of thin and nectar thick liquids. He was discharged on Dys 2 diet and honey thick liquids. PMH includes: AFib (warfarin), bradycardia s/p PPM, aortic stenosis s/p bioprosthetic AVR, CABG, CKD, GERD, and T2DM Type of Study: Bedside Swallow Evaluation Previous Swallow Assessment: see HPI Diet Prior to this Study: NPO Temperature Spikes Noted: No Respiratory Status: Nasal cannula History of Recent Intubation: No Behavior/Cognition: Lethargic/Drowsy;Requires cueing (pt's behavior greatly affected by recent administering of pain medication) Oral Cavity Assessment: Dried secretions;Dry (oral cavity and tongue coated; lips appear dry with visible flaking and discoloration) Oral Care Completed by SLP: Yes Oral Cavity - Dentition: Missing dentition;Poor condition (daughter stated he wears upper dentures, but they were not present at time of eval) Patient Positioning: Upright in bed Baseline Vocal Quality: Breathy;Low vocal intensity Volitional Cough: Weak Volitional Swallow: Unable to elicit (prompted to swallow multiple times and pt did  not volitionally swallow)    Oral/Motor/Sensory Function Overall Oral Motor/Sensory Function: Generalized oral weakness (ability to follow commands given during oral mech exam greatly reduced due to effects of pain medication/drowsiness) Facial ROM: Reduced left;Reduced right Facial Symmetry: Within Functional Limits Facial Strength: Reduced right;Reduced left Lingual ROM: Reduced right;Reduced left Lingual Symmetry: Within Functional Limits Lingual Strength: Reduced Velum: Other (comment) (UTA)   Ice Chips Ice chips: Not tested   Thin Liquid Thin Liquid: Impaired (pt reflexively responded to small amount of water given on a swab and closed his lips around swab once when prompted) Oral Phase Impairments: Poor awareness of bolus;Reduced lingual movement/coordination;Reduced labial seal Pharyngeal  Phase Impairments: Unable to trigger swallow    Nectar Thick Nectar Thick Liquid: Not tested   Honey Thick Honey Thick Liquid: Not tested   Puree Puree: Not tested   Solid     Solid: Not tested      Fabio Asa, Student SLP 03/08/2022,3:58 PM

## 2022-03-09 ENCOUNTER — Ambulatory Visit: Payer: Medicare Other

## 2022-03-09 ENCOUNTER — Telehealth: Payer: Self-pay

## 2022-03-09 ENCOUNTER — Encounter (HOSPITAL_COMMUNITY): Payer: Self-pay | Admitting: Internal Medicine

## 2022-03-09 DIAGNOSIS — N1832 Chronic kidney disease, stage 3b: Secondary | ICD-10-CM

## 2022-03-09 DIAGNOSIS — I4821 Permanent atrial fibrillation: Secondary | ICD-10-CM

## 2022-03-09 DIAGNOSIS — N179 Acute kidney failure, unspecified: Secondary | ICD-10-CM | POA: Diagnosis not present

## 2022-03-09 DIAGNOSIS — J189 Pneumonia, unspecified organism: Secondary | ICD-10-CM | POA: Diagnosis not present

## 2022-03-09 DIAGNOSIS — N189 Chronic kidney disease, unspecified: Secondary | ICD-10-CM

## 2022-03-09 LAB — GLUCOSE, CAPILLARY
Glucose-Capillary: 145 mg/dL — ABNORMAL HIGH (ref 70–99)
Glucose-Capillary: 159 mg/dL — ABNORMAL HIGH (ref 70–99)
Glucose-Capillary: 163 mg/dL — ABNORMAL HIGH (ref 70–99)
Glucose-Capillary: 190 mg/dL — ABNORMAL HIGH (ref 70–99)
Glucose-Capillary: 205 mg/dL — ABNORMAL HIGH (ref 70–99)

## 2022-03-09 LAB — PHOSPHORUS
Phosphorus: 2.7 mg/dL (ref 2.5–4.6)
Phosphorus: 2.2 mg/dL — ABNORMAL LOW (ref 2.5–4.6)

## 2022-03-09 LAB — BASIC METABOLIC PANEL
Anion gap: 11 (ref 5–15)
Anion gap: 14 (ref 5–15)
BUN: 60 mg/dL — ABNORMAL HIGH (ref 8–23)
BUN: 67 mg/dL — ABNORMAL HIGH (ref 8–23)
CO2: 31 mmol/L (ref 22–32)
CO2: 29 mmol/L (ref 22–32)
Calcium: 8.9 mg/dL (ref 8.9–10.3)
Calcium: 8.6 mg/dL — ABNORMAL LOW (ref 8.9–10.3)
Chloride: 115 mmol/L — ABNORMAL HIGH (ref 98–111)
Chloride: 114 mmol/L — ABNORMAL HIGH (ref 98–111)
Creatinine, Ser: 1.91 mg/dL — ABNORMAL HIGH (ref 0.61–1.24)
Creatinine, Ser: 2 mg/dL — ABNORMAL HIGH (ref 0.61–1.24)
GFR, Estimated: 32 mL/min — ABNORMAL LOW (ref >60)
GFR, Estimated: 30 mL/min — ABNORMAL LOW (ref >60)
Glucose, Bld: 187 mg/dL — ABNORMAL HIGH (ref 70–99)
Glucose, Bld: 225 mg/dL — ABNORMAL HIGH (ref 70–99)
Potassium: 3.2 mmol/L — ABNORMAL LOW (ref 3.5–5.1)
Potassium: 5.2 mmol/L — ABNORMAL HIGH (ref 3.5–5.1)
Sodium: 157 mmol/L — ABNORMAL HIGH (ref 135–145)
Sodium: 157 mmol/L — ABNORMAL HIGH (ref 135–145)

## 2022-03-09 LAB — CBC
HCT: 41 % (ref 39.0–52.0)
Hemoglobin: 12.1 g/dL — ABNORMAL LOW (ref 13.0–17.0)
MCH: 26.8 pg (ref 26.0–34.0)
MCHC: 29.5 g/dL — ABNORMAL LOW (ref 30.0–36.0)
MCV: 90.7 fL (ref 80.0–100.0)
Platelets: 283 10*3/uL (ref 150–400)
RBC: 4.52 MIL/uL (ref 4.22–5.81)
RDW: 16.9 % — ABNORMAL HIGH (ref 11.5–15.5)
WBC: 12.2 10*3/uL — ABNORMAL HIGH (ref 4.0–10.5)
nRBC: 0.2 % (ref 0.0–0.2)

## 2022-03-09 LAB — PROTIME-INR
INR: 5.9 (ref 0.8–1.2)
Prothrombin Time: 52.2 seconds — ABNORMAL HIGH (ref 11.4–15.2)

## 2022-03-09 LAB — MAGNESIUM
Magnesium: 2.4 mg/dL (ref 1.7–2.4)
Magnesium: 2.5 mg/dL — ABNORMAL HIGH (ref 1.7–2.4)

## 2022-03-09 MED ORDER — OSMOLITE 1.5 CAL PO LIQD
1000.0000 mL | ORAL | Status: DC
  Filled 2022-03-09 (×3): qty 1000

## 2022-03-09 MED ORDER — PHYTONADIONE 5 MG PO TABS
2.5000 mg | ORAL_TABLET | Freq: Once | ORAL | Status: DC
  Filled 2022-03-09: qty 1

## 2022-03-09 MED ORDER — PROSOURCE TF20 ENFIT COMPATIBL EN LIQD: 60.0000 mL | Freq: Two times a day (BID) | ENTERAL | Status: DC

## 2022-03-09 MED ORDER — TAMSULOSIN HCL 0.4 MG PO CAPS: 0.4000 mg | ORAL_CAPSULE | Freq: Every day | ORAL | Status: DC

## 2022-03-09 MED ORDER — FREE WATER: 150.0000 mL | Status: DC

## 2022-03-09 MED ORDER — POTASSIUM CHLORIDE 10 MEQ/100ML IV SOLN
10.0000 meq | INTRAVENOUS | Status: AC
  Administered 2022-03-09 (×2): 10 meq via INTRAVENOUS
  Filled 2022-03-09 (×2): qty 100

## 2022-03-09 MED ORDER — VITAL HIGH PROTEIN PO LIQD: 1000.0000 mL | ORAL | Status: DC

## 2022-03-09 MED ORDER — DEXTROSE 5 % IV SOLN: INTRAVENOUS | Status: DC

## 2022-03-09 NOTE — Evaluation (Signed)
Physical Therapy Evaluation Patient Details Name: [name] MRN: [id number] DOB: April 18, 1925 Today's Date: 03/09/2022  History of Present Illness  89 yo male presents to [facility] on 1/8 from SNF with ShOB and hypoxia. CXR shows pulmonary vascular congestion and patchy airspace opacities of LLL concerning for HCAP vs aspiration. d/c from [facility] on 1/7 for acute hypoxic respiratory failure due to PNA, parainfluenza. PMH includes A-fib on warfarin, pacemaker for symptomatic bradycardia, status post AV valve replacement, CABG, CKD, reflux, DM2.  Clinical Impression   Pt presents with impaired strength, poor sitting and standing balance at high risk for falls, inability to come to full standing, and decreased activity tolerance. Cognition unclear at this point given pt's drowsiness during session. Pt to benefit from acute PT to address deficits. Pt requiring up to max assist for transfer level mobility at this time, very limited verbalizations during session except when needing to urinate. PT recommending return to SNF post-acutely. PT to progress mobility as tolerated, and will continue to follow acutely.         Recommendations for follow up therapy are one component of a multi-disciplinary discharge planning process, led by the attending physician.  Recommendations may be updated based on patient status, additional functional criteria and insurance authorization.  Follow Up Recommendations Skilled nursing-short term rehab (<3 hours/day) Can patient physically be transported by private vehicle: No    Assistance Recommended at Discharge Frequent or constant Supervision/Assistance  Patient can return home with the following  Two people to help with walking and/or transfers;Two people to help with bathing/dressing/bathroom    Equipment Recommendations Other (comment) (defer)  Recommendations for Other Services       Functional Status Assessment Patient has had a recent decline in their  functional status and/or demonstrates limited ability to make significant improvements in function in a reasonable and predictable amount of time     Precautions / Restrictions Precautions Precautions: Fall Precaution Comments: pt leans posteriorly, difficult to keep awake and opening eyes only occasionally on command. Restrictions Weight Bearing Restrictions: No      Mobility  Bed Mobility               General bed mobility comments: up in recliner    Transfers Overall transfer level: Needs assistance Equipment used: 1 person hand held assist, 2 person hand held assist Transfers: Sit to/from Stand Sit to Stand: Mod assist, +2 safety/equipment           General transfer comment: pt with good initiation of stand, mod assist for rise and transitioning to max assist once standing to maintain semi-standing. Pt unable to come to full upright standing, multiple stand attempts.    Ambulation/Gait               General Gait Details: unable  Stairs            Wheelchair Mobility    Modified Rankin (Stroke Patients Only)       Balance Overall balance assessment: Needs assistance   Sitting balance-Leahy Scale: Poor Sitting balance - Comments: L lateral lean, benefits from posterior support of recliner Postural control: Posterior lean Standing balance support: During functional activity, Bilateral upper extremity supported Standing balance-Leahy Scale: Zero Standing balance comment: max to maintain upright                             Pertinent Vitals/Pain Pain Assessment Pain Assessment: Faces Faces Pain Scale: Hurts a little bit Pain  Location: discomfort with urination Pain Descriptors / Indicators: Grimacing Pain Intervention(s): Limited activity within patient's tolerance, Monitored during session, Repositioned    Home Living Family/patient expects to be discharged to:: Skilled nursing facility                   Additional  Comments: Pt comes from [facility] after admit earlier this month.  Pt previously lived alone with family assisting PRN.    Prior Function Prior Level of Function : Other (comment);Patient poor historian/Family not available (has been total care in SNF)               ADLs Comments: total care in SNF since end of 2023     Hand Dominance   Dominant Hand: Right    Extremity/Trunk Assessment   Upper Extremity Assessment Upper Extremity Assessment: Defer to OT evaluation    Lower Extremity Assessment Lower Extremity Assessment: Generalized weakness;Difficult to assess due to impaired cognition (Knee extension ROM WFL bilat, lacking knee flexion ROM LLE but suspect due to guarding.)    Cervical / Trunk Assessment Cervical / Trunk Assessment: Kyphotic  Communication   Communication: HOH  Cognition Arousal/Alertness: Lethargic Behavior During Therapy: Flat affect Overall Cognitive Status: No family/caregiver present to determine baseline cognitive functioning                                 General Comments: pt consistently and accurately nods yes/no to orientation questions, keeps eyes closed for a majority of session. Follows one-step commands 50% of the time        General Comments General comments (skin integrity, edema, etc.): Pt cognitvely not able to participate with adls or mobilty today with eyes closed and not following many commands.    Exercises     Assessment/Plan    PT Assessment Patient needs continued PT services  PT Problem List Decreased strength;Decreased range of motion;Decreased activity tolerance;Decreased balance;Decreased mobility;Decreased knowledge of use of DME;Decreased safety awareness;Decreased knowledge of precautions;Decreased cognition       PT Treatment Interventions DME instruction;Gait training;Functional mobility training;Therapeutic activities;Therapeutic exercise;Balance training;Patient/family education    PT Goals  (Current goals can be found in the Care Plan section)  Acute Rehab PT Goals Patient Stated Goal: None stated PT Goal Formulation: Patient unable to participate in goal setting Time For Goal Achievement: 03/23/22 Potential to Achieve Goals: Fair    Frequency Min 2X/week     Co-evaluation               AM-PAC PT "6 Clicks" Mobility  Outcome Measure Help needed turning from your back to your side while in a flat bed without using bedrails?: A Lot Help needed moving from lying on your back to sitting on the side of a flat bed without using bedrails?: A Lot Help needed moving to and from a bed to a chair (including a wheelchair)?: Total Help needed standing up from a chair using your arms (e.g., wheelchair or bedside chair)?: Total Help needed to walk in hospital room?: Total Help needed climbing 3-5 steps with a railing? : Total 6 Click Score: 8    End of Session Equipment Utilized During Treatment: Gait belt Activity Tolerance: Patient limited by fatigue Patient left: in chair;with call bell/phone within reach;with chair alarm set Nurse Communication: Mobility status PT Visit Diagnosis: Other abnormalities of gait and mobility (R26.89);Muscle weakness (generalized) (M62.81)    Time: 8828-0034 PT Time Calculation (min) (ACUTE  ONLY): 20 min   Charges:   PT Evaluation $PT Eval Low Complexity: 1 Low         Tymier Lindholm S, PT DPT Acute Rehabilitation Services Pager [phone]  Office [phone]   Roxine Caddy E Ruffin Pyo 03/09/2022, 2:17 PM

## 2022-03-09 NOTE — Telephone Encounter (Signed)
Patient daughter called in stating that he is in the hospital and can not send a transmission right now. He will send one once he gets home

## 2022-03-09 NOTE — Progress Notes (Signed)
[place] for Warfarin Indication: atrial fibrillation and bioprosthetic AVR  Allergies  Allergen Reactions   Demerol [Meperidine] Other (See Comments)    Causes hallucinations   Meperidine Hcl Other (See Comments)   Nasal Spray Other (See Comments)   Simvastatin Other (See Comments)   Patient Measurements: Weight: 65.3 kg (144 lb)  Vital Signs: Temp: 97.8 F (36.6 C) (01/10 0800) Temp Source: Oral (01/10 0800) BP: 106/66 (01/10 0800) Pulse Rate: 60 (01/10 0800)  Labs: Recent Labs    03/07/22 2307 03/07/22 2321 03/08/22 0021 03/09/22 0438 03/09/22 0901  HGB 13.3 15.3  14.6  --  12.1*  --   HCT 44.8 45.0  43.0  --  41.0  --   PLT 289  --   --  283  --   LABPROT 32.8*  --   --   --  52.2*  INR 3.2*  --   --   --  5.9*  CREATININE 2.06* 2.00*  --  1.91*  --   TROPONINIHS 38*  --  35*  --   --     Estimated Creatinine Clearance: 21.8 mL/min (A) (by C-G formula based on SCr of 1.91 mg/dL (H)).  Medical History:  Assessment: 89 yo F BIB EMS on 03/07/22 from SNF for SOB and hypoxia. Pt was recently admitted for PNA from 1/2-03/06/22. Pt has PMH of AFib (warfarin), bradycardia s/p PPM, aortic stenosis s/p bioprosthetic AVR, CABG, CKD, GERD, and T2DM. Pt has been initiated on broad-spectrum antibiotics with cefepime and vancomycin for pneumonia. Pharmacy has been consulted for warfarin dosing for bioprosthetic AVR + AFib.   Home regimen: warfarin 2.'5mg'$  qHS  1/10: INR supratherapeutic, last dose received 1/7 @ 21:00 2.'5mg'$ . Drop in Hgb but no acute issues noted. Plt stable  Goal of Therapy:  INR 2-3 Monitor platelets by anticoagulation protocol: Yes   Plan:  Continue to hold warfarin Monitor daily INR, CBC, and for s/sx of bleeding daily  Lorenso Courier, PharmD Clinical Pharmacist 03/09/2022 10:17 AM

## 2022-03-09 NOTE — Progress Notes (Addendum)
Cortrak Tube Team Note:  Consult received to place a Cortrak feeding tube. Initially RD reached out to MD to approve placement for INR >2; MD approved placement.  RD unsuccessful with placement after multiple attempts. O2 levels continued to drop during placement. 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 RD reached out to MD, if tube placement is warranted, recommend placement by fluoroscopy.    Hermina Barters RD, LDN Clinical Dietitian See Shea Evans for contact information.

## 2022-03-09 NOTE — Evaluation (Signed)
Occupational Therapy Evaluation Patient Details Name: [name] MRN: [id number] DOB: Jan 13, 1926 Today's Date: 03/09/2022   History of Present Illness Pt is a 89 y.o. male who presents 03/07/22 with shortness of breath and weakness with associated cough and fatigue. Pt was admitted last week for same symptoms with diagnosis of pneumonia. PMH significant for diabetes, bradycardia, anemia, status post AVR, status post pacemaker, complete heart block, hypertension, atrial fibrillation, CAD, CKD 3, CHF   Clinical Impression   Pt admitted for second time in one week for above diagnosis and has the deficits outlined below. Pt currently is not following many commands or opening eyes for therapy. Pt sat on EOB and got into chair to change linens with max assist overall.  Feel pt's participation is going to be limited and will need to return to SNF at this point due to decreased alertness and participation.  Will trial this pt with some OT to see if there is any follow through and determine future OT plans when reevaluated.        Recommendations for follow up therapy are one component of a multi-disciplinary discharge planning process, led by the attending physician.  Recommendations may be updated based on patient status, additional functional criteria and insurance authorization.   Follow Up Recommendations  Skilled nursing-short term rehab (<3 hours/day)     Assistance Recommended at Discharge Frequent or constant Supervision/Assistance  Patient can return home with the following Two people to help with walking and/or transfers;A lot of help with bathing/dressing/bathroom;Assistance with cooking/housework;Assist for transportation;Help with stairs or ramp for entrance    Functional Status Assessment  Patient has had a recent decline in their functional status and demonstrates the ability to make significant improvements in function in a reasonable and predictable amount of time.  Equipment  Recommendations  Other (comment) (tbd)    Recommendations for Other Services       Precautions / Restrictions Precautions Precautions: Fall Precaution Comments: pt leans posteriorly, difficult to keep awake and opening eyes only occasionally on command. Restrictions Weight Bearing Restrictions: No      Mobility Bed Mobility Overal bed mobility: Needs Assistance Bed Mobility: Supine to Sit     Supine to sit: Mod assist     General bed mobility comments: assist to get legs off of bed and trunk into full sitting.    Transfers Overall transfer level: Needs assistance Equipment used: 1 person hand held assist Transfers: Sit to/from Stand, Bed to chair/wheelchair/BSC Sit to Stand: Mod assist Stand pivot transfers: Max assist         General transfer comment: Pt did not participate in transfer actively.  Pt beared weight through BLEs while therapist pivoted pt to chair so bed could be changed.      Balance Overall balance assessment: History of Falls, Needs assistance Sitting-balance support: Feet supported, Bilateral upper extremity supported Sitting balance-Leahy Scale: Fair Sitting balance - Comments: Pt sat EOB with eyes closed for 1 min with CG assist while dozing off.   Standing balance support: Bilateral upper extremity supported, During functional activity, Reliant on assistive device for balance Standing balance-Leahy Scale: Poor Standing balance comment: posterior bias requiring increased physical assist to maintain standing balance while transitioning from bed to recliner.                           ADL either performed or assessed with clinical judgement   ADL Overall ADL's : Needs assistance/impaired Eating/Feeding: Total assistance;Sitting  Grooming: Wash/dry face;Oral care;Wash/dry hands;Total assistance;Sitting Grooming Details (indicate cue type and reason): attempted hand over hand assist with familiar items like toothbrush and washcloth  with no carryover. Upper Body Bathing: Total assistance   Lower Body Bathing: Total assistance   Upper Body Dressing : Total assistance   Lower Body Dressing: Total assistance   Toilet Transfer: Moderate assistance;Stand-pivot;BSC/3in1 Toilet Transfer Details (indicate cue type and reason): +1 assist from therapist.  No much physical assist given.  Pt assisted onto his feet from therapist and did not participate in transfer but was able to take weight through his legs to make transfer mod assist. Toileting- Clothing Manipulation and Hygiene: Total assistance;Sit to/from stand Toileting - Clothing Manipulation Details (indicate cue type and reason): in bed     Functional mobility during ADLs: Maximal assistance General ADL Comments: Pt unable to participate in any adls. Pt not following many commands, eyes closed through session. Pt sitting in soaking wet bed on arrival. Pt unaware of this.  Pt transferred to chair and bed linens changed.     Vision Baseline Vision/History: 0 No visual deficits Patient Visual Report: No change from baseline Vision Assessment?: Vision impaired- to be further tested in functional context Additional Comments: unsure about pts vision.  Pt able to see chair to his L but would not follow any commands for specific visual testing. Pt chose to have eyes closed during 95% of session despite cues to open them.     Perception Perception Perception Tested?: No   Praxis Praxis Praxis tested?: Not tested    Pertinent Vitals/Pain Pain Assessment Faces Pain Scale: Hurts a little bit Pain Location: ROM of BUEs Pain Descriptors / Indicators: Grimacing Pain Intervention(s): Limited activity within patient's tolerance, Monitored during session, Repositioned     Hand Dominance Right   Extremity/Trunk Assessment Upper Extremity Assessment Upper Extremity Assessment: Generalized weakness   Lower Extremity Assessment Lower Extremity Assessment: Defer to PT  evaluation   Cervical / Trunk Assessment Cervical / Trunk Assessment: Kyphotic   Communication Communication Communication: HOH   Cognition Arousal/Alertness: Lethargic Behavior During Therapy: Flat affect Overall Cognitive Status: No family/caregiver present to determine baseline cognitive functioning                                 General Comments: Pt oriented to self. Did not know where he was or why in hospital.  Info given with no carryover.     General Comments  Pt cognitvely not able to participate with adls or mobilty today with eyes closed and not following many commands.    Exercises     Shoulder Instructions      Home Living Family/patient expects to be discharged to:: Skilled nursing facility                                 Additional Comments: Pt comes from [facility] after admit earlier this month.  Pt previously lived alone with family assisting PRN.      Prior Functioning/Environment Prior Level of Function : Other (comment);Patient poor historian/Family not available (has been total care in SNF)               ADLs Comments: total care in SNF since end of 2023        OT Problem List: Decreased strength;Decreased knowledge of use of DME or AE;Decreased coordination;Decreased activity tolerance;Decreased cognition;Decreased  safety awareness;Impaired balance (sitting and/or standing)      OT Treatment/Interventions: Self-care/ADL training;Therapeutic activities;DME and/or AE instruction    OT Goals(Current goals can be found in the care plan section) Acute Rehab OT Goals Patient Stated Goal: unable OT Goal Formulation: Patient unable to participate in goal setting Time For Goal Achievement: 03/23/22 Potential to Achieve Goals: Poor ADL Goals Pt Will Perform Eating: with min assist;sitting Pt Will Perform Grooming: with min assist;sitting Additional ADL Goal #1: Pt will follow ones step commands w eyes open consistently  to increase participation in therapy Additional ADL Goal #2: Pt will transfer to [facility] with min assist to assist caregiver in toileting skills.  OT Frequency: Min 2X/week    Co-evaluation              AM-PAC OT "6 Clicks" Daily Activity     Outcome Measure Help from another person eating meals?: Total Help from another person taking care of personal grooming?: Total Help from another person toileting, which includes using toliet, bedpan, or urinal?: Total Help from another person bathing (including washing, rinsing, drying)?: Total Help from another person to put on and taking off regular upper body clothing?: Total Help from another person to put on and taking off regular lower body clothing?: Total 6 Click Score: 6   End of Session Equipment Utilized During Treatment: Gait belt;Oxygen Nurse Communication: Mobility status  Activity Tolerance: Patient limited by lethargy Patient left: in chair;with call bell/phone within reach;with chair alarm set  OT Visit Diagnosis: Unsteadiness on feet (R26.81);Repeated falls (R29.6);Muscle weakness (generalized) (M62.81)                Time: 6789-3810 OT Time Calculation (min): 35 min Charges:  OT General Charges $OT Visit: 1 Visit OT Evaluation $OT Eval Moderate Complexity: 1 Mod OT Treatments $Self Care/Home Management : 8-22 mins  Glenford Peers 03/09/2022, 11:19 AM

## 2022-03-09 NOTE — Progress Notes (Signed)
Initial Nutrition Assessment  DOCUMENTATION CODES:   Not applicable  INTERVENTION:   -RD will follow for diet advancement and add supplements as appropriate -Per MD, plan for cortak placement. When tube placement is verified:  Initiate Osmolite 1.5 @ 20 ml/hr and increase by 10 ml every 12 hours to goal rate of 60 ml/hr.   60 ml Prosource TF BID.    150 ml free water flush every 4 hours  Tube feeding regimen provides 1960 kcal (100% of needs), 115 grams of protein, and 1814 ml of H2O. Total free water: 1814 ml daily    -Monitor Mg, K, and Phos and replete as needed secondary to high refeeding risk  NUTRITION DIAGNOSIS:   Inadequate oral intake related to inability to eat, dysphagia as evidenced by NPO status.  GOAL:   Patient will meet greater than or equal to 90% of their needs  MONITOR:   Diet advancement  REASON FOR ASSESSMENT:   Consult Assessment of nutrition requirement/status  ASSESSMENT:   Pt with medical history significant of CAD s/p CABG; DM; stage 4 CKD; and afib with pacemaker presenting with SOB.  Pt admitted with HCAP.   1/9- s/p BSE- recommend NPO  Reviewed I/O's: +55 ml x 24 hours  UOP: 400 ml x 24 hours   Pt unavailable at time of visit. Attempted to speak with pt via call to hospital room phone, however, unable to reach. RD unable to obtain further nutrition-related history or complete nutrition-focused physical exam at this time.    Per SLP notes, pt with history of esophageal dysphagia; pt recently underwent a MBSS on 03/04/22 and a dysphagia 2 diet with honey thick liquids was recommend. Per SLP, pt with poor dentition and pt was having difficulty with recommended diet. NPO is recommended secondary to lethargy and progressive dysphagia. SLP plans to follow-up once pt is more alert.   Reviewed wt hx; pt has experienced a 11.8% wt loss over the past 2 months, which is significant for time frame. Highly suspect malnutrition given dysphagia and  significant wt loss, however, unable to identify at this time.   Case discussed with RN; plan for cortrak tube placement today. Also discussed with cortrak team.   Palliative care consult pending for goals of care.   Medications reviewed and include colace and dextrose 5% solution @ 75 ml/hr.   Lab Results  Component Value Date   HGBA1C 7.7 (H) 09/03/2016   PTA DM medications are 1 mg amaryl daily. Per ADA's Standards of Medical Care of Diabetes, glycemic targets for older adults who have multiple co-morbidities, cognitive impairments, and functional dependence should be less stringent (Hgb A1c <8.0-8.5).    Labs reviewed: Na: 157, K: 3.2, CBGS: 163 (inpatient orders for glycemic control are 0-9 units insulin aspart TID).    Diet Order:   Diet Order             Diet NPO time specified  Diet effective now                   EDUCATION NEEDS:   Not appropriate for education at this time  Skin:  Skin Assessment: Reviewed RN Assessment  Last BM:  Unknown  Height:   Ht Readings from Last 1 Encounters:  03/02/22 '5\' 10"'$  (1.778 m)    Weight:   Wt Readings from Last 1 Encounters:  03/07/22 65.3 kg    Ideal Body Weight:  75.5 kg  BMI:  Body mass index is 20.66 kg/m.  Estimated Nutritional  Needs:   Kcal:  1850-2050  Protein:  100-115 grams  Fluid:  > 1.8 L    Loistine Chance, RD, LDN, [place] Registered Dietitian II Certified Diabetes Care and Education Specialist Please refer to [facility] for RD and/or RD on-call/weekend/after hours pager

## 2022-03-09 NOTE — Plan of Care (Signed)

## 2022-03-09 NOTE — Progress Notes (Signed)
SLP Cancellation Note  Patient Details Name: [name] MRN: [id number] DOB: 04-03-1925   Cancelled treatment:       Reason Eval/Treat Not Completed: Patient at procedure or test/unavailable;Other (comment) (getting Cortrak placed by RD in room)   Sonia Baller, MA, CCC-SLP Speech Therapy

## 2022-03-09 NOTE — Progress Notes (Signed)
PROGRESS NOTE    [name]  UMP:[id number] DOB: 1925-08-06 DOA: 03/07/2022 PCP: Wenda Low, MD   Chief Complaint  Patient presents with   Shortness of Breath    Brief Narrative:   [name] is a 89 y.o. male with medical history significant of CAD s/p CABG; DM; stage 4 CKD; and afib with pacemaker presenting with SOB.  He was last admitted from 1/2-7 with CAP and parainfluenza, treated with Rocephin/Azithromycin.  His daughter feels like he wasn't ready to go home - Saturday he was asking for water but not eating well.  Sunday, he was gray and much less responsive than usual.  The nurse called yesterday and said he was dying and family needed to come up.  He is having confusion, generalized weakness, cough productive of yucky sputum.  ?aspiration - has trouble with applesauce.  He did have a swallow eval last hospitalization, needed thicker liquids.  No fever.  He has chronic pedal edema.       ER Course:  Carryover, per Dr. Marlowe Sax:   89 year old with history of CAD status post CABG, paroxysmal A-fib, diabetes, CKD stage IV, CHB status post PPM discharged from the hospital 2 days ago after being treated for pneumonia and parainfluenza infection.  He was sent to SNF and there was not eating or drinking and sent back to the ED.  Labs showing sodium 151, creatinine 2.0 (previously 1.4).  Troponin in the 30s and stable.  BNP is in the 300s and chest x-ray showing pulmonary vascular congestion and patchy airspace opacities at the left lung base.  Hypoxic to the 80s and placed on 2 L.  CT head is negative.  He was started on gentle IV fluids due to concern for dehydration//AKI/rising sodium.       Assessment & Plan:   Principal Problem:   HCAP (healthcare-associated pneumonia) Active Problems:   Atrial fibrillation ([place])   Coronary artery disease   Pacemaker   Acute kidney injury superimposed on chronic kidney disease (HCC)   Type 2 diabetes mellitus with vascular  disease (HCC)   CKD (chronic kidney disease) stage 3, GFR 30-59 ml/min (HCC)   BPH (benign prostatic hyperplasia)   DNR (do not resuscitate)     HCAP -Recent admission for parainfluenza and CAP -He was treated with Rocephin/Azithro and eventually weaned to room air and discharged 2 days ago to SNF -He persists with decreased responsiveness, minimal PO intake, cough -He had an episode of possible aspiration at the facility -CXR with LLL opacity, concerning for HCAP vs. Aspiration -For now, NPO  -Procalcitonin is mildly positive -Will cover for now with Cefepime and Vanc -Will follow blood cultures  Acute metabolic encephalopathy -Patient is significantly altered, minimally responsive, this is most likely due to dehydration, infection hyponatremia -Will keep n.p.o. for now given it is unsafe for him to swallow yet -Have discussed with the son, at baseline patient lives by himself, driving, and he was doing so his recent admission last week.  Hypernatremia -Volume depletion and dehydration, will change fluid to D5W, will start on free water via core track  Failure to thrive Goals of care -Patient appears to be extremely frail, deconditioned, as discussed with the son, he was just living by himself to listen hospitalization, his baseline is active, still driving and ambulatory without assistance, so we will continue with current measures, treat the treatable, this includes IV fluids, antibiotics, and core Trak with tube feed, and will reassess progression with current measures -Monitor closely  for refeeding syndrome   AKI on CKD 3-4  -Creatinine improved to 1.47 at the time of last discharge -Current creatinine is worse again -Suspect recurrent AKI -Hydrate and follow     Afib -Rate controlled without medications -on warfarin  Supratherapeutic INR -INR significantly elevated at 5.9, I am afraid despite holding his warfarin INR will keep trending up given his significant  malnutrition, so I will give low-dose of vitamin K 2.5 mg via tube once today.   CAD -s/p CABG   DM -hold glimepride -Cover with sensitive-scale SSI    BPH -Resume tamsulosin when NGT is inserted.      DVT prophylaxis: on warfarin  Code Status: DNR Family Communication: D/W son by phone Disposition:   Status is: Inpatient    Consultants:  None   Subjective:  Patient cannot provide any complaints, no significant events as discussed with staff  Objective: Vitals:   03/09/22 0000 03/09/22 0112 03/09/22 0309 03/09/22 0800  BP: 128/68 107/61 117/60 106/66  Pulse: 70 66 60 60  Resp: '18 16 18 '$ (!) 21  Temp:  (!) 97.5 F (36.4 C) 97.8 F (36.6 C) 97.8 F (36.6 C)  TempSrc:  Axillary Axillary Oral  SpO2: 96% 95% 98% 97%  Weight:        Intake/Output Summary (Last 24 hours) at 03/09/2022 1250 Last data filed at 03/08/2022 2336 Gross per 24 hour  Intake 455.14 ml  Output 400 ml  Net 55.14 ml   Filed Weights   03/07/22 2151  Weight: 65.3 kg    Examination:  eyes closed, does not follow any commands or answer any questions, he mumbles intermittently, extremely frail, deconditioned and thin appearing Symmetrical Chest wall movement, Good air movement bilaterally, CTAB RRR,No Gallops,Rubs or new Murmurs, No Parasternal Heave +ve B.Sounds, Abd Soft, No tenderness, No rebound - guarding or rigidity. No Cyanosis, Clubbing or edema, No new Rash or bruise       Data Reviewed: I have personally reviewed following labs and imaging studies  CBC: Recent Labs  Lab 03/03/22 0229 03/04/22 0411 03/07/22 2307 03/07/22 2321 03/09/22 0438  WBC 8.7 5.5 9.4  --  12.2*  NEUTROABS  --   --  8.2*  --   --   HGB 11.3* 10.2* 13.3 15.3  14.6 12.1*  HCT 36.1* 33.7* 44.8 45.0  43.0 41.0  MCV 87.0 87.3 90.7  --  90.7  PLT 229 218 289  --  144    Basic Metabolic Panel: Recent Labs  Lab 03/03/22 0229 03/04/22 0411 03/06/22 0217 03/07/22 2307 03/07/22 2321  03/09/22 0438 03/09/22 0901  NA 139 142 147* 151* 155*  153* 157*  --   K 4.5 3.7 3.6 3.8 3.9  3.9 3.2*  --   CL 101 103 105 105 107 115*  --   CO2 '26 31 31 '$ 33*  --  31  --   GLUCOSE 243* 249* 141* 340* 340* 187*  --   BUN 65* 52* 33* 53* 53* 60*  --   CREATININE 2.39* 1.71* 1.47* 2.06* 2.00* 1.91*  --   CALCIUM 8.8* 9.0 8.8* 9.0  --  8.9  --   MG  --  2.5* 2.2 2.4  --   --  2.4  PHOS  --   --   --   --   --   --  2.7    GFR: Estimated Creatinine Clearance: 21.8 mL/min (A) (by C-G formula based on SCr of 1.91 mg/dL (H)).  Liver Function  Tests: Recent Labs  Lab 03/03/22 0229 03/07/22 2307  AST 31 22  ALT 16 16  ALKPHOS 50 59  BILITOT 0.5 1.1  PROT 6.4* 7.4  ALBUMIN 2.9* 2.9*    CBG: Recent Labs  Lab 03/06/22 0848 03/08/22 1214 03/08/22 1750 03/09/22 0815 03/09/22 1230  GLUCAP 201* 242* 187* 163* 190*     Recent Results (from the past 240 hour(s))  Resp panel by RT-PCR (RSV, Flu A&B, Covid) Anterior Nasal Swab     Status: None   Collection Time: 03/01/22  8:55 PM   Specimen: Anterior Nasal Swab  Result Value Ref Range Status   SARS Coronavirus 2 by RT PCR NEGATIVE NEGATIVE Final    Comment: (NOTE) SARS-CoV-2 target nucleic acids are NOT DETECTED.  The SARS-CoV-2 RNA is generally detectable in upper respiratory specimens during the acute phase of infection. The lowest concentration of SARS-CoV-2 viral copies this assay can detect is 138 copies/mL. A negative result does not preclude SARS-Cov-2 infection and should not be used as the sole basis for treatment or other patient management decisions. A negative result may occur with  improper specimen collection/handling, submission of specimen other than nasopharyngeal swab, presence of viral mutation(s) within the areas targeted by this assay, and inadequate number of viral copies(<138 copies/mL). A negative result must be combined with clinical observations, patient history, and epidemiological information.  The expected result is Negative.  Fact Sheet for Patients:  EntrepreneurPulse.com.au  Fact Sheet for Healthcare Providers:  IncredibleEmployment.be  This test is no t yet approved or cleared by the [place] FDA and  has been authorized for detection and/or diagnosis of SARS-CoV-2 by FDA under an Emergency Use Authorization (EUA). This EUA will remain  in effect (meaning this test can be used) for the duration of the COVID-19 declaration under Section 564(b)(1) of the Act, 21 U.S.C.section 360bbb-3(b)(1), unless the authorization is terminated  or revoked sooner.       Influenza A by PCR NEGATIVE NEGATIVE Final   Influenza B by PCR NEGATIVE NEGATIVE Final    Comment: (NOTE) The Xpert Xpress SARS-CoV-2/FLU/RSV plus assay is intended as an aid in the diagnosis of influenza from Nasopharyngeal swab specimens and should not be used as a sole basis for treatment. Nasal washings and aspirates are unacceptable for Xpert Xpress SARS-CoV-2/FLU/RSV testing.  Fact Sheet for Patients: EntrepreneurPulse.com.au  Fact Sheet for Healthcare Providers: IncredibleEmployment.be  This test is not yet approved or cleared by the [place] FDA and has been authorized for detection and/or diagnosis of SARS-CoV-2 by FDA under an Emergency Use Authorization (EUA). This EUA will remain in effect (meaning this test can be used) for the duration of the COVID-19 declaration under Section 564(b)(1) of the Act, 21 U.S.C. section 360bbb-3(b)(1), unless the authorization is terminated or revoked.     Resp Syncytial Virus by PCR NEGATIVE NEGATIVE Final    Comment: (NOTE) Fact Sheet for Patients: EntrepreneurPulse.com.au  Fact Sheet for Healthcare Providers: IncredibleEmployment.be  This test is not yet approved or cleared by the [place] FDA and has been authorized for detection and/or  diagnosis of SARS-CoV-2 by FDA under an Emergency Use Authorization (EUA). This EUA will remain in effect (meaning this test can be used) for the duration of the COVID-19 declaration under Section 564(b)(1) of the Act, 21 U.S.C. section 360bbb-3(b)(1), unless the authorization is terminated or revoked.  Performed at [facility], [place] [address] [place], [place] [address]   Respiratory (~20 pathogens) panel by PCR  Status: Abnormal   Collection Time: 03/02/22  2:03 PM   Specimen: Nasopharyngeal Swab; Respiratory  Result Value Ref Range Status   Adenovirus NOT DETECTED NOT DETECTED Final   Coronavirus 229E NOT DETECTED NOT DETECTED Final    Comment: (NOTE) The Coronavirus on the Respiratory Panel, DOES NOT test for the novel  Coronavirus (2019 nCoV)    Coronavirus HKU1 NOT DETECTED NOT DETECTED Final   Coronavirus NL63 NOT DETECTED NOT DETECTED Final   Coronavirus OC43 NOT DETECTED NOT DETECTED Final   Metapneumovirus NOT DETECTED NOT DETECTED Final   Rhinovirus / Enterovirus NOT DETECTED NOT DETECTED Final   Influenza A NOT DETECTED NOT DETECTED Final   Influenza B NOT DETECTED NOT DETECTED Final   Parainfluenza Virus 1 DETECTED (A) NOT DETECTED Final   Parainfluenza Virus 2 NOT DETECTED NOT DETECTED Final   Parainfluenza Virus 3 NOT DETECTED NOT DETECTED Final   Parainfluenza Virus 4 NOT DETECTED NOT DETECTED Final   Respiratory Syncytial Virus NOT DETECTED NOT DETECTED Final   Bordetella pertussis NOT DETECTED NOT DETECTED Final   Bordetella Parapertussis NOT DETECTED NOT DETECTED Final   Chlamydophila pneumoniae NOT DETECTED NOT DETECTED Final   Mycoplasma pneumoniae NOT DETECTED NOT DETECTED Final    Comment: Performed at [facility], [place]. [address], [place], [place] [address]  Culture, blood (Routine X 2) w Reflex to ID Panel     Status: None (Preliminary result)   Collection Time: 03/08/22  6:01 PM   Specimen: BLOOD LEFT HAND  Result Value Ref Range  Status   Specimen Description BLOOD LEFT HAND  Final   Special Requests   Final    BOTTLES DRAWN AEROBIC AND ANAEROBIC Blood Culture results may not be optimal due to an inadequate volume of blood received in culture bottles   Culture   Final    NO GROWTH < 24 HOURS Performed at [facility], [place] [address] [place], [place] [address]    Report Status PENDING  Incomplete  Culture, blood (Routine X 2) w Reflex to ID Panel     Status: None (Preliminary result)   Collection Time: 03/09/22  4:38 AM   Specimen: BLOOD LEFT ARM  Result Value Ref Range Status   Specimen Description BLOOD LEFT ARM  Final   Special Requests   Final    BOTTLES DRAWN AEROBIC AND ANAEROBIC Blood Culture adequate volume   Culture   Final    NO GROWTH < 12 HOURS Performed at [facility], [place] [address] [place], [place] [address]    Report Status PENDING  Incomplete         Radiology Studies: CT HEAD WO CONTRAST (5MM)  Result Date: 03/08/2022 CLINICAL DATA:  Mental status change, unknown cause EXAM: CT HEAD WITHOUT CONTRAST TECHNIQUE: Contiguous axial images were obtained from the base of the skull through the vertex without intravenous contrast. RADIATION DOSE REDUCTION: This exam was performed according to the departmental dose-optimization program which includes automated exposure control, adjustment of the mA and/or kV according to patient size and/or use of iterative reconstruction technique. COMPARISON:  10/14/2016 FINDINGS: Brain: Moderate parenchymal volume loss is commensurate with the patient's age appears slightly progressive since prior examination. Moderate periventricular white matter changes are present likely reflecting the sequela of small vessel ischemia, also progressive since prior exam. Remote lacunar infarct within the right putamen and remote cortical infarcts within the right parietal and occipital lobes are seen, new since prior examination. No evidence of acute intracranial  hemorrhage or infarct. No abnormal mass effect or midline shift. No abnormal intra or extra-axial mass lesion or fluid collection. Ventricular size is normal. Cerebellum is unremarkable. Vascular: No hyperdense vessel or unexpected calcification. Skull: Normal. Negative for fracture or focal lesion. Sinuses/Orbits: There is mucosal thickening and lobulated soft tissue demonstrating central calcification within the maxillary and sphenoid sinuses in keeping with changes of chronic sinusitis. Remaining paranasal sinuses are clear. Orbits are unremarkable. Other: Mastoid air cells and middle ear cavities are clear. IMPRESSION: 1. No acute intracranial abnormality. No calvarial fracture. 2. Progressive senescent changes and remote infarcts as outlined above. 3. Chronic left maxillary and sphenoid sinusitis. Electronically Signed   By: Fidela Salisbury M.D.   On: 03/08/2022 01:56   DG Chest Portable 1 View  Result Date: 03/07/2022 CLINICAL DATA:  Shortness of breath with hypoxia. EXAM: PORTABLE CHEST 1 VIEW COMPARISON:  Chest x-ray 03/01/2022.  Chest CT 03/02/2022. FINDINGS: The heart is enlarged, unchanged. Sternotomy wires, prosthetic heart valve and left-sided pacemaker are again seen. There is central pulmonary vascular congestion. There is some patchy opacities in the left lung base with small left pleural effusion. There is no pneumothorax or acute fracture. Left diaphragmatic hernia again seen. IMPRESSION: 1. Cardiomegaly with central pulmonary vascular congestion. 2. Patchy airspace opacities in the left lung base with small left pleural effusion. Electronically Signed   By: Ronney Asters M.D.   On: 03/07/2022 23:22        Scheduled Meds:  docusate sodium  100 mg Oral BID   feeding supplement (PROSource TF20)  60 mL Per Tube BID   [START ON 03/10/2022] free water  150 mL Per Tube Q4H   insulin aspart  0-9 Units Subcutaneous TID WC   sodium chloride flush  3 mL Intravenous Q12H   vancomycin variable  dose per unstable renal function (pharmacist dosing)   Does not apply See admin instructions   Warfarin - Pharmacist Dosing Inpatient   Does not apply q1600   Continuous Infusions:  ceFEPime (MAXIPIME) IV 2 g (03/09/22 0942)   dextrose 75 mL/hr at 03/09/22 0940   feeding supplement (OSMOLITE 1.5 CAL)       LOS: 1 day      Phillips Climes, MD [facility]   To contact the attending provider between 7A-7P or the covering provider during after hours 7P-7A, please log into the web site [email or web address] and access using universal [place] password for that web site. If you do not have the password, please call the hospital operator.  03/09/2022, 12:50 PM

## 2022-03-10 ENCOUNTER — Ambulatory Visit: Payer: Medicare Other | Admitting: Podiatry

## 2022-03-10 ENCOUNTER — Inpatient Hospital Stay (HOSPITAL_COMMUNITY): Payer: Medicare Other

## 2022-03-10 DIAGNOSIS — J189 Pneumonia, unspecified organism: Secondary | ICD-10-CM | POA: Diagnosis not present

## 2022-03-10 DIAGNOSIS — N189 Chronic kidney disease, unspecified: Secondary | ICD-10-CM | POA: Diagnosis not present

## 2022-03-10 DIAGNOSIS — N179 Acute kidney failure, unspecified: Secondary | ICD-10-CM | POA: Diagnosis not present

## 2022-03-10 DIAGNOSIS — I4821 Permanent atrial fibrillation: Secondary | ICD-10-CM | POA: Diagnosis not present

## 2022-03-10 LAB — CBC
HCT: 41.7 % (ref 39.0–52.0)
Hemoglobin: 12 g/dL — ABNORMAL LOW (ref 13.0–17.0)
MCH: 26.5 pg (ref 26.0–34.0)
MCHC: 28.8 g/dL — ABNORMAL LOW (ref 30.0–36.0)
MCV: 92.1 fL (ref 80.0–100.0)
Platelets: 262 10*3/uL (ref 150–400)
RBC: 4.53 MIL/uL (ref 4.22–5.81)
RDW: 17.1 % — ABNORMAL HIGH (ref 11.5–15.5)
WBC: 12.5 10*3/uL — ABNORMAL HIGH (ref 4.0–10.5)
nRBC: 0.2 % (ref 0.0–0.2)

## 2022-03-10 LAB — MAGNESIUM: Magnesium: 2.6 mg/dL — ABNORMAL HIGH (ref 1.7–2.4)

## 2022-03-10 LAB — BASIC METABOLIC PANEL
Anion gap: 12 (ref 5–15)
Anion gap: 13 (ref 5–15)
BUN: 68 mg/dL — ABNORMAL HIGH (ref 8–23)
BUN: 69 mg/dL — ABNORMAL HIGH (ref 8–23)
CO2: 30 mmol/L (ref 22–32)
CO2: 25 mmol/L (ref 22–32)
Calcium: 8.9 mg/dL (ref 8.9–10.3)
Calcium: 8.4 mg/dL — ABNORMAL LOW (ref 8.9–10.3)
Chloride: 117 mmol/L — ABNORMAL HIGH (ref 98–111)
Chloride: 116 mmol/L — ABNORMAL HIGH (ref 98–111)
Creatinine, Ser: 2.01 mg/dL — ABNORMAL HIGH (ref 0.61–1.24)
Creatinine, Ser: 1.91 mg/dL — ABNORMAL HIGH (ref 0.61–1.24)
GFR, Estimated: 30 mL/min — ABNORMAL LOW (ref >60)
GFR, Estimated: 32 mL/min — ABNORMAL LOW (ref >60)
Glucose, Bld: 236 mg/dL — ABNORMAL HIGH (ref 70–99)
Glucose, Bld: 287 mg/dL — ABNORMAL HIGH (ref 70–99)
Potassium: 3.6 mmol/L (ref 3.5–5.1)
Potassium: 3.9 mmol/L (ref 3.5–5.1)
Sodium: 159 mmol/L — ABNORMAL HIGH (ref 135–145)
Sodium: 154 mmol/L — ABNORMAL HIGH (ref 135–145)

## 2022-03-10 LAB — GLUCOSE, CAPILLARY
Glucose-Capillary: 150 mg/dL — ABNORMAL HIGH (ref 70–99)
Glucose-Capillary: 173 mg/dL — ABNORMAL HIGH (ref 70–99)
Glucose-Capillary: 206 mg/dL — ABNORMAL HIGH (ref 70–99)
Glucose-Capillary: 213 mg/dL — ABNORMAL HIGH (ref 70–99)
Glucose-Capillary: 268 mg/dL — ABNORMAL HIGH (ref 70–99)
Glucose-Capillary: 280 mg/dL — ABNORMAL HIGH (ref 70–99)

## 2022-03-10 LAB — PROTIME-INR
INR: 5.7 (ref 0.8–1.2)
Prothrombin Time: 50.7 seconds — ABNORMAL HIGH (ref 11.4–15.2)

## 2022-03-10 LAB — VANCOMYCIN, RANDOM: Vancomycin Rm: 11 ug/mL

## 2022-03-10 LAB — PHOSPHORUS: Phosphorus: 2.7 mg/dL (ref 2.5–4.6)

## 2022-03-10 MED ORDER — LORAZEPAM 1 MG PO TABS: 1.0000 mg | ORAL_TABLET | ORAL | Status: DC | PRN

## 2022-03-10 MED ORDER — GLYCOPYRROLATE 1 MG PO TABS: 1.0000 mg | ORAL_TABLET | ORAL | Status: DC | PRN

## 2022-03-10 MED ORDER — SODIUM PHOSPHATES 45 MMOLE/15ML IV SOLN
30.0000 mmol | Freq: Once | INTRAVENOUS | Status: AC
  Administered 2022-03-10: 30 mmol via INTRAVENOUS
  Filled 2022-03-10: qty 10

## 2022-03-10 MED ORDER — LORAZEPAM 2 MG/ML IJ SOLN
1.0000 mg | INTRAMUSCULAR | Status: DC | PRN
  Administered 2022-03-11 – 2022-03-12 (×3): 1 mg via INTRAVENOUS
  Filled 2022-03-10 (×3): qty 1

## 2022-03-10 MED ORDER — ONDANSETRON HCL 4 MG/2ML IJ SOLN: 4.0000 mg | Freq: Four times a day (QID) | INTRAMUSCULAR | Status: DC | PRN

## 2022-03-10 MED ORDER — GLYCOPYRROLATE 0.2 MG/ML IJ SOLN: 0.2000 mg | INTRAMUSCULAR | Status: DC | PRN

## 2022-03-10 MED ORDER — LORAZEPAM 2 MG/ML PO CONC: 1.0000 mg | ORAL | Status: DC | PRN

## 2022-03-10 MED ORDER — ONDANSETRON 4 MG PO TBDP: 4.0000 mg | ORAL_TABLET | Freq: Four times a day (QID) | ORAL | Status: DC | PRN

## 2022-03-10 MED ORDER — MORPHINE SULFATE (PF) 2 MG/ML IV SOLN
1.0000 mg | INTRAVENOUS | Status: DC | PRN
  Administered 2022-03-11: 1 mg via INTRAVENOUS
  Filled 2022-03-10: qty 1

## 2022-03-10 MED ORDER — VANCOMYCIN HCL IN DEXTROSE 1-5 GM/200ML-% IV SOLN
1000.0000 mg | INTRAVENOUS | Status: DC
  Administered 2022-03-10: 1000 mg via INTRAVENOUS
  Filled 2022-03-10: qty 200

## 2022-03-10 NOTE — Progress Notes (Signed)
Pharmacy Antibiotic Note  [name] is a 89 y.o. male admitted on 03/07/2022 with pneumonia.  Pharmacy was consulted on 03/08/22 for Cefepime and Vancomycin dosing.  03/10/22:  WBC 9.4>>12.5, afebrile SCr 2.01 (baseline Scr ~1.4-1.6), CrCl 19.6 ml/min Vancomycin random level this morning  = 11 approx 40 hours after '1500mg'$ x1 dose  given 03/08/22,>>Will give Vancomycin 1000 mg IV q48h and monitor renal function  Plan: Continue Cefepime 2g IV Q24h Give Vancomycin 1000 mg IV q48h, eAUC 545, SCr used 2.01 Monitor clinical progress, cultures,  renal function. Check SS  vanc levels per protocol if needed.  Goal vancomycin trough level 15-20,  goal AUC 400-550 C heck vancomycin levels pending renal function Check MRSA PCR Monitor daily CBC, temp, SCr, and for clinical signs of improvement  F/u cultures and de-scalate antibiotics as able   Weight: 61.6 kg (135 lb 12.9 oz)  Temp (24hrs), Avg:98.1 F (36.7 C), Min:97.8 F (36.6 C), Max:98.6 F (37 C)  Recent Labs  Lab 03/04/22 0411 03/06/22 0217 03/07/22 2307 03/07/22 2321 03/09/22 0438 03/09/22 1641 03/10/22 0513  WBC 5.5  --  9.4  --  12.2*  --  12.5*  CREATININE 1.71*   < > 2.06* 2.00* 1.91* 2.00* 2.01*  VANCORANDOM  --   --   --   --   --   --  11   < > = values in this interval not displayed.     Estimated Creatinine Clearance: 19.6 mL/min (A) (by C-G formula based on SCr of 2.01 mg/dL (H)).    Allergies  Allergen Reactions   Demerol [Meperidine] Other (See Comments)    Causes hallucinations   Meperidine Hcl Other (See Comments)   Nasal Spray Other (See Comments)   Simvastatin Other (See Comments)    Antimicrobials this admission: Cefepime 1/9 >>  Vancomycin 1/9 >>   Dose adjustments this admission: 1/11 VR = 11 approx 40 hr after '1500mg'$ x1 given 1/9> will give 1000 mg q48h   Microbiology results: 1/10 Bcx ngtd 1/9 Bcx ngtd 1/9 Resp: +ve Parainflu   Thank you for allowing pharmacy to be a part of this  patient's care.  Nicole Cella, RPh Clinical Pharmacist   Please refer to [facility] for pharmacy phone number

## 2022-03-10 NOTE — Progress Notes (Signed)
Palliative-   Consult received, chart reviewed. Patient admitted with HCAP, metabolic encephalopathy, hypernatremia. Has significant dysphagia and poor po intake- NG/Cortrak insertion has been unsuccessful in the setting of complicated anatomy.  Called patient's son to arrange [place] meeting. Son stated he has a meeting scheduled with patient's doctor this evening, he did not want to schedule meeting with PMT. Son agreed to followup phone call tomorrow from PMT provider and to schedule a meeting at that time if needed based on outcomes of his discussion with attending MD this evening.   Mariana Kaufman, AGNP-C Palliative Medicine  No charge

## 2022-03-10 NOTE — Inpatient Diabetes Management (Signed)
Inpatient Diabetes Program Recommendations  AACE/ADA: New Consensus Statement on Inpatient Glycemic Control (2015)  Target Ranges:  Prepandial:   less than 140 mg/dL      Peak postprandial:   less than 180 mg/dL (1-2 hours)      Critically ill patients:  140 - 180 mg/dL   Lab Results  Component Value Date   GLUCAP 280 (H) 03/10/2022   HGBA1C 7.7 (H) 09/03/2016    Latest Reference Range & Units 03/09/22 23:47 03/10/22 04:14 03/10/22 08:58 03/10/22 12:19  Glucose-Capillary 70 - 99 mg/dL 159 (H) 206 (H) 213 (H) 280 (H)  (H): Data is abnormally high Review of Glycemic Control  Diabetes history: type 2 Outpatient Diabetes medications: amaryl 1 mg BID Current orders for Inpatient glycemic control: Novolog 0-9 units correction scale TID  Inpatient Diabetes Program Recommendations:   Noted that patient is not eating well. Possibilility of starting tube feedings. Unable to get Cortrak inserted.   If blood sugars continue to be greater than 180 mg/dl, recommend Novolog 0-9 units correction scale every 4 hours especially if starting on tube feedings.  Harvel Ricks RN BSN CDE Diabetes Coordinator Pager: [phone]  8am-5pm

## 2022-03-10 NOTE — Plan of Care (Signed)
  Problem: Activity: Goal: Ability to tolerate increased activity will improve Outcome: Progressing   Problem: Clinical Measurements: Goal: Ability to maintain a body temperature in the normal range will improve Outcome: Progressing   Problem: Respiratory: Goal: Ability to maintain adequate ventilation will improve Outcome: Progressing Goal: Ability to maintain a clear airway will improve Outcome: Progressing   Problem: Education: Goal: Knowledge of General Education information will improve Description: Including pain rating scale, medication(s)/side effects and non-pharmacologic comfort measures Outcome: Progressing   Problem: Health Behavior/Discharge Planning: Goal: Ability to manage health-related needs will improve Outcome: Progressing   Problem: Clinical Measurements: Goal: Ability to maintain clinical measurements within normal limits will improve Outcome: Progressing Goal: Will remain free from infection Outcome: Progressing Goal: Diagnostic test results will improve Outcome: Progressing Goal: Respiratory complications will improve Outcome: Progressing Goal: Cardiovascular complication will be avoided Outcome: Progressing   Problem: Activity: Goal: Risk for activity intolerance will decrease Outcome: Progressing   Problem: Nutrition: Goal: Adequate nutrition will be maintained Outcome: Progressing   Problem: Coping: Goal: Level of anxiety will decrease Outcome: Progressing   Problem: Elimination: Goal: Will not experience complications related to bowel motility Outcome: Progressing Goal: Will not experience complications related to urinary retention Outcome: Progressing   Problem: Pain Managment: Goal: General experience of comfort will improve Outcome: Progressing   Problem: Safety: Goal: Ability to remain free from injury will improve Outcome: Progressing   Problem: Skin Integrity: Goal: Risk for impaired skin integrity will decrease Outcome:  Progressing   Problem: Education: Goal: Knowledge of the prescribed therapeutic regimen will improve Outcome: Progressing   Problem: Coping: Goal: Ability to identify and develop effective coping behavior will improve Outcome: Progressing   Problem: Clinical Measurements: Goal: Quality of life will improve Outcome: Progressing   Problem: Respiratory: Goal: Verbalizations of increased ease of respirations will increase Outcome: Progressing   Problem: Role Relationship: Goal: Family's ability to cope with current situation will improve Outcome: Progressing Goal: Ability to verbalize concerns, feelings, and thoughts to partner or family member will improve Outcome: Progressing   Problem: Pain Management: Goal: Satisfaction with pain management regimen will improve Outcome: Progressing

## 2022-03-10 NOTE — Progress Notes (Signed)
Speech Language Pathology Treatment: Dysphagia  Patient Details Name: [name] MRN: [id number] DOB: 06-Feb-1926 Today's Date: 03/10/2022 Time: 1012-1027 SLP Time Calculation (min) (ACUTE ONLY): 15 min  Assessment / Plan / Recommendation Clinical Impression  Pt seen for PO trials d/t pt being made NPO and Cortrak TF unable to be placed via nursing staff.  Pt provided extensive oral care with min verbal cues for oral directives with xerostomia/dry oral mucosa observed initially, but improved post oral care.  Pt administered honey-thick liquids and puree via 1/2 tsp amount with oral holding, grimacing (with honey-thick liquids) and prolonged oral transit.  Weak delayed cough noted with puree tsp amount.  POs halted after limited assessment.  Recommend NPO status at this time and ST will f/u for PO readiness in acute setting.    HPI HPI: Pt is a 89 yo male presenting from SNF 1/8 with SOB and hypoxia. He was recently admitted for PNA 1/2-1/7, with MBS completed 1/5 revealing oropharyngeal dysphagia and aspiration of thin and nectar thick liquids. He was discharged on Dys 2 diet and honey thick liquids. PMH includes: AFib (warfarin), bradycardia s/p PPM, aortic stenosis s/p bioprosthetic AVR, CABG, CKD, GERD, and T2DM;MBS completed on 03/04/22 indicated need for Dysphagia 2(minced)/honey-thick liquids.  Nursing concerned with intake, so swallow re-assessment ordered and pt placed NPO.      SLP Plan  Other (Comment) (Pt may benefit from non-oral feeding to maintain nutrition/hydration needs and/or downgrade to D1/HTL via tsp amounts and if s/sx of aspiration, halt POs)      Recommendations for follow up therapy are one component of a multi-disciplinary discharge planning process, led by the attending physician.  Recommendations may be updated based on patient status, additional functional criteria and insurance authorization.    Recommendations  Diet recommendations: Other(comment) (D1/HTL or  non-oral feeding) Liquids provided via: Teaspoon Medication Administration: Crushed with puree (or via alternative means) Supervision: Full supervision/cueing for compensatory strategies Compensations: Slow rate;Small sips/bites                Oral Care Recommendations: Oral care QID Follow Up Recommendations: Skilled nursing-short term rehab (<3 hours/day) Assistance recommended at discharge: Frequent or constant Supervision/Assistance SLP Visit Diagnosis: Dysphagia, unspecified (R13.10) Plan: Other (Comment) (Pt may benefit from non-oral feeding to maintain nutrition/hydration needs and/or downgrade to D1/HTL.)           Pat Lundy Cozart,M.S.,CCC-SLP  03/10/2022, 12:29 PM

## 2022-03-10 NOTE — Progress Notes (Signed)
[facility] for Warfarin Indication: atrial fibrillation and bioprosthetic AVR  Allergies  Allergen Reactions   Demerol [Meperidine] Other (See Comments)    Causes hallucinations   Meperidine Hcl Other (See Comments)   Nasal Spray Other (See Comments)   Simvastatin Other (See Comments)   Patient Measurements: Weight: 61.6 kg (135 lb 12.9 oz)  Vital Signs: Temp: 98.6 F (37 C) (01/11 1600) Temp Source: Axillary (01/11 1600) BP: 105/52 (01/11 1600) Pulse Rate: 60 (01/11 1600)  Labs: Recent Labs    03/07/22 2307 03/07/22 2321 03/08/22 0021 03/09/22 0438 03/09/22 0901 03/09/22 1641 03/10/22 0513  HGB 13.3 15.3  14.6  --  12.1*  --   --  12.0*  HCT 44.8 45.0  43.0  --  41.0  --   --  41.7  PLT 289  --   --  283  --   --  262  LABPROT 32.8*  --   --   --  52.2*  --  50.7*  INR 3.2*  --   --   --  5.9*  --  5.7*  CREATININE 2.06* 2.00*  --  1.91*  --  2.00* 2.01*  TROPONINIHS 38*  --  35*  --   --   --   --     Estimated Creatinine Clearance: 19.6 mL/min (A) (by C-G formula based on SCr of 2.01 mg/dL (H)).  Medical History:  Assessment: 89 yo F BIB EMS on 03/07/22 from SNF for SOB and hypoxia. Pt was recently admitted for PNA from 1/2-03/06/22. Pt has PMH of AFib (warfarin), bradycardia s/p PPM, aortic stenosis s/p bioprosthetic AVR, CABG, CKD, GERD, and T2DM. Pt has been initiated on broad-spectrum antibiotics with cefepime and vancomycin for pneumonia. Pharmacy n consulted for warfarin dosing for  Afib, bioprosthetic AVR.  Home regimen: warfarin 2.'5mg'$  qHS  1/10: INR supratherapeutic, last dose received 1/7 @ 21:00 2.'5mg'$ . Drop in Hgb but no acute issues noted. Plt stable  INR 3.2>5.9>5.7,  no warfarin given since PTA, LD 1/7 PM Hgb 15.3>12.1>12.0 ,  Pltc wnl stable.  No bleeding reported  Goal of Therapy:  INR 2-3 Monitor platelets by anticoagulation protocol: Yes   Plan:  Continue to hold warfarin Monitor daily INR, CBC, and  for s/sx of bleeding daily  Nicole Cella, RPh Clinical Pharmacist

## 2022-03-10 NOTE — Progress Notes (Signed)
I have discussed with son, and daughter at bedside, discussed with them overall poor prognosis, visually in the setting of his encephalopathy, and extensive diaphragmatic hernia, which inhibits nasogastric tube insertion and tube feeding, and major contributory cause of aspiration, failure to thrive, and malnutrition, patient was significant deterioration of functional and mental capacity over the last few days, at this point we discussed goals of care, and currently ultimate goals of care is comfort care, son reports likely you would like to take patient home with hospice, he would like to have the palliative meeting in the a.m., so they can decide what is best plan regarding disposition, patient will be transition to comfort measures, palliative care order set has been utilized.a Phillips Climes MD

## 2022-03-10 NOTE — Progress Notes (Signed)
PROGRESS NOTE    [name]  RUE:[id number] DOB: 1925-08-03 DOA: 03/07/2022 PCP: Wenda Low, MD   Chief Complaint  Patient presents with   Shortness of Breath    Brief Narrative:   Richards Pherigo is a 89 y.o. male with medical history significant of CAD s/p CABG; DM; stage 4 CKD; and afib with pacemaker presenting with SOB.  He was last admitted from 1/2-7 with CAP and parainfluenza, treated with Rocephin/Azithromycin.  His daughter feels like he wasn't ready to go home - Saturday he was asking for water but not eating well.  Sunday, he was gray and much less responsive than usual.  The nurse called yesterday and said he was dying and family needed to come up.  He is having confusion, generalized weakness, cough productive of yucky sputum.  ?aspiration - has trouble with applesauce.  He did have a swallow eval last hospitalization, needed thicker liquids.  No fever.  He has chronic pedal edema.       ER Course:  Carryover, per Dr. Marlowe Sax:   89 year old with history of CAD status post CABG, paroxysmal A-fib, diabetes, CKD stage IV, CHB status post PPM discharged from the hospital 2 days ago after being treated for pneumonia and parainfluenza infection.  He was sent to SNF and there was not eating or drinking and sent back to the ED.  Labs showing sodium 151, creatinine 2.0 (previously 1.4).  Troponin in the 30s and stable.  BNP is in the 300s and chest x-ray showing pulmonary vascular congestion and patchy airspace opacities at the left lung base.  Hypoxic to the 80s and placed on 2 L.  CT head is negative.  He was started on gentle IV fluids due to concern for dehydration//AKI/rising sodium.       Assessment & Plan:   Principal Problem:   HCAP (healthcare-associated pneumonia) Active Problems:   Atrial fibrillation ([place])   Coronary artery disease   Pacemaker   Acute kidney injury superimposed on chronic kidney disease (HCC)   Type 2 diabetes mellitus with vascular  disease (HCC)   CKD (chronic kidney disease) stage 3, GFR 30-59 ml/min (HCC)   BPH (benign prostatic hyperplasia)   DNR (do not resuscitate)     HCAP -Recent admission for parainfluenza and CAP -He was treated with Rocephin/Azithro and eventually weaned to room air and discharged 2 days ago to SNF -He persists with decreased responsiveness, minimal PO intake, cough -He had an episode of possible aspiration at the facility -CXR with LLL opacity, concerning for HCAP vs. Aspiration -Will cover for now with Cefepime and Vanc -Will follow blood cultures -Patient likely high risk for aspiration given his mental status, and significant severely elevated left hemidiaphragm herniation in the mediastinum of several structures including portions of stomach, pancreas, small bowel and vasculature.  Acute metabolic encephalopathy -Patient is significantly altered, minimally responsive, this is most likely due to dehydration, infection hyponatremia -Have discussed with the son, at baseline patient lives by himself, driving, and he was doing so his recent admission last week. - did not well with SLP evaluation today, he remains n.p.o.  Hypernatremia -Will increase D5W at 125 cc/h, unfortunately no NG tube was inserted, cannot add any free water.  Failure to thrive Goals of care -Patient appears to be extremely frail, deconditioned, as discussed with the son, he was just living by himself to listen hospitalization, his baseline is active, still driving and ambulatory without assistance, but does appear to me he has rapid deterioration  in functional status, as well and nutritional status, she will plan was to allow for oral feeding via NG tube or core track, this option currently does not seem possible given multiple failed attempts by fluoroscopy and core track team in the setting of large Bashan of several abdominal structure into his mediastinum   AKI on CKD 3-4  -Creatinine improved to 1.47 at the  time of last discharge -Current creatinine is worse again -Suspect recurrent AKI -Hydrate and follow     Afib -Rate controlled without medications -on warfarin  Supratherapeutic INR -INR significantly elevated at 5.7  CAD -s/p CABG   DM -hold glimepride -Cover with sensitive-scale SSI    BPH -Resume tamsulosin when NGT is inserted.      DVT prophylaxis: on warfarin  Code Status: DNR Family Communication: D/W son by phone Disposition:   Status is: Inpatient    Consultants:  None   Subjective:  Cannot provide any complaints today, unfortunately both cortrak team has tried to insert NG tube yesterday, and fluoroscopy team tried today under fluoroscopy with no success.  Objective: Vitals:   03/10/22 1013 03/10/22 1025 03/10/22 1200 03/10/22 1240  BP:   124/64   Pulse:  63 60 63  Resp:   18   Temp:   97.8 F (36.6 C)   TempSrc:   Axillary   SpO2: 94%  98% 98%  Weight:        Intake/Output Summary (Last 24 hours) at 03/10/2022 1338 Last data filed at 03/10/2022 0758 Gross per 24 hour  Intake --  Output 600 ml  Net -600 ml   Filed Weights   03/07/22 2151 03/10/22 0500  Weight: 65.3 kg 61.6 kg    Examination:  eyes closed, does not follow any commands or answer any questions, he mumbles intermittently, extremely frail, deconditioned and thin appearing he is cachectic, thin appearing Symmetrical Chest wall movement, start entry at the bases RRR,No Gallops,Rubs or new Murmurs, No Parasternal Heave +ve B.Sounds, Abd Soft, No tenderness, No rebound - guarding or rigidity. No Cyanosis, Clubbing or edema, No new Rash or bruise       Data Reviewed: I have personally reviewed following labs and imaging studies  CBC: Recent Labs  Lab 03/04/22 0411 03/07/22 2307 03/07/22 2321 03/09/22 0438 03/10/22 0513  WBC 5.5 9.4  --  12.2* 12.5*  NEUTROABS  --  8.2*  --   --   --   HGB 10.2* 13.3 15.3  14.6 12.1* 12.0*  HCT 33.7* 44.8 45.0  43.0 41.0 41.7   MCV 87.3 90.7  --  90.7 92.1  PLT 218 289  --  283 732    Basic Metabolic Panel: Recent Labs  Lab 03/06/22 0217 03/07/22 2307 03/07/22 2321 03/09/22 0438 03/09/22 0901 03/09/22 1641 03/10/22 0513  NA 147* 151* 155*  153* 157*  --  157* 159*  K 3.6 3.8 3.9  3.9 3.2*  --  5.2* 3.6  CL 105 105 107 115*  --  114* 117*  CO2 31 33*  --  31  --  29 30  GLUCOSE 141* 340* 340* 187*  --  225* 236*  BUN 33* 53* 53* 60*  --  67* 68*  CREATININE 1.47* 2.06* 2.00* 1.91*  --  2.00* 2.01*  CALCIUM 8.8* 9.0  --  8.9  --  8.6* 8.9  MG 2.2 2.4  --   --  2.4 2.5* 2.6*  PHOS  --   --   --   --  2.7 2.2* 2.7    GFR: Estimated Creatinine Clearance: 19.6 mL/min (A) (by C-G formula based on SCr of 2.01 mg/dL (H)).  Liver Function Tests: Recent Labs  Lab 03/07/22 2307  AST 22  ALT 16  ALKPHOS 59  BILITOT 1.1  PROT 7.4  ALBUMIN 2.9*    CBG: Recent Labs  Lab 03/09/22 2111 03/09/22 2347 03/10/22 0414 03/10/22 0858 03/10/22 1219  GLUCAP 145* 159* 206* 213* 280*     Recent Results (from the past 240 hour(s))  Resp panel by RT-PCR (RSV, Flu A&B, Covid) Anterior Nasal Swab     Status: None   Collection Time: 03/01/22  8:55 PM   Specimen: Anterior Nasal Swab  Result Value Ref Range Status   SARS Coronavirus 2 by RT PCR NEGATIVE NEGATIVE Final    Comment: (NOTE) SARS-CoV-2 target nucleic acids are NOT DETECTED.  The SARS-CoV-2 RNA is generally detectable in upper respiratory specimens during the acute phase of infection. The lowest concentration of SARS-CoV-2 viral copies this assay can detect is 138 copies/mL. A negative result does not preclude SARS-Cov-2 infection and should not be used as the sole basis for treatment or other patient management decisions. A negative result may occur with  improper specimen collection/handling, submission of specimen other than nasopharyngeal swab, presence of viral mutation(s) within the areas targeted by this assay, and inadequate number  of viral copies(<138 copies/mL). A negative result must be combined with clinical observations, patient history, and epidemiological information. The expected result is Negative.  Fact Sheet for Patients:  EntrepreneurPulse.com.au  Fact Sheet for Healthcare Providers:  IncredibleEmployment.be  This test is no t yet approved or cleared by the [place] FDA and  has been authorized for detection and/or diagnosis of SARS-CoV-2 by FDA under an Emergency Use Authorization (EUA). This EUA will remain  in effect (meaning this test can be used) for the duration of the COVID-19 declaration under Section 564(b)(1) of the Act, 21 U.S.C.section 360bbb-3(b)(1), unless the authorization is terminated  or revoked sooner.       Influenza A by PCR NEGATIVE NEGATIVE Final   Influenza B by PCR NEGATIVE NEGATIVE Final    Comment: (NOTE) The Xpert Xpress SARS-CoV-2/FLU/RSV plus assay is intended as an aid in the diagnosis of influenza from Nasopharyngeal swab specimens and should not be used as a sole basis for treatment. Nasal washings and aspirates are unacceptable for Xpert Xpress SARS-CoV-2/FLU/RSV testing.  Fact Sheet for Patients: EntrepreneurPulse.com.au  Fact Sheet for Healthcare Providers: IncredibleEmployment.be  This test is not yet approved or cleared by the [place] FDA and has been authorized for detection and/or diagnosis of SARS-CoV-2 by FDA under an Emergency Use Authorization (EUA). This EUA will remain in effect (meaning this test can be used) for the duration of the COVID-19 declaration under Section 564(b)(1) of the Act, 21 U.S.C. section 360bbb-3(b)(1), unless the authorization is terminated or revoked.     Resp Syncytial Virus by PCR NEGATIVE NEGATIVE Final    Comment: (NOTE) Fact Sheet for Patients: EntrepreneurPulse.com.au  Fact Sheet for Healthcare  Providers: IncredibleEmployment.be  This test is not yet approved or cleared by the [place] FDA and has been authorized for detection and/or diagnosis of SARS-CoV-2 by FDA under an Emergency Use Authorization (EUA). This EUA will remain in effect (meaning this test can be used) for the duration of the COVID-19 declaration under Section 564(b)(1) of the Act, 21 U.S.C. section 360bbb-3(b)(1), unless the authorization is terminated or revoked.  Performed at [facility],  [address] [place], [place] [address]   Respiratory (~20 pathogens) panel by PCR     Status: Abnormal   Collection Time: 03/02/22  2:03 PM   Specimen: Nasopharyngeal Swab; Respiratory  Result Value Ref Range Status   Adenovirus NOT DETECTED NOT DETECTED Final   Coronavirus 229E NOT DETECTED NOT DETECTED Final    Comment: (NOTE) The Coronavirus on the Respiratory Panel, DOES NOT test for the novel  Coronavirus (2019 nCoV)    Coronavirus HKU1 NOT DETECTED NOT DETECTED Final   Coronavirus NL63 NOT DETECTED NOT DETECTED Final   Coronavirus OC43 NOT DETECTED NOT DETECTED Final   Metapneumovirus NOT DETECTED NOT DETECTED Final   Rhinovirus / Enterovirus NOT DETECTED NOT DETECTED Final   Influenza A NOT DETECTED NOT DETECTED Final   Influenza B NOT DETECTED NOT DETECTED Final   Parainfluenza Virus 1 DETECTED (A) NOT DETECTED Final   Parainfluenza Virus 2 NOT DETECTED NOT DETECTED Final   Parainfluenza Virus 3 NOT DETECTED NOT DETECTED Final   Parainfluenza Virus 4 NOT DETECTED NOT DETECTED Final   Respiratory Syncytial Virus NOT DETECTED NOT DETECTED Final   Bordetella pertussis NOT DETECTED NOT DETECTED Final   Bordetella Parapertussis NOT DETECTED NOT DETECTED Final   Chlamydophila pneumoniae NOT DETECTED NOT DETECTED Final   Mycoplasma pneumoniae NOT DETECTED NOT DETECTED Final    Comment: Performed at [facility], [place]. [address] [place], [place] [address]  Culture, blood  (Routine X 2) w Reflex to ID Panel     Status: None (Preliminary result)   Collection Time: 03/08/22  6:01 PM   Specimen: BLOOD LEFT HAND  Result Value Ref Range Status   Specimen Description BLOOD LEFT HAND  Final   Special Requests   Final    BOTTLES DRAWN AEROBIC AND ANAEROBIC Blood Culture results may not be optimal due to an inadequate volume of blood received in culture bottles   Culture   Final    NO GROWTH 2 DAYS Performed at [facility], [place] [address], [place], [place] [address]    Report Status PENDING  Incomplete  Culture, blood (Routine X 2) w Reflex to ID Panel     Status: None (Preliminary result)   Collection Time: 03/09/22  4:38 AM   Specimen: BLOOD LEFT ARM  Result Value Ref Range Status   Specimen Description BLOOD LEFT ARM  Final   Special Requests   Final    BOTTLES DRAWN AEROBIC AND ANAEROBIC Blood Culture adequate volume   Culture   Final    NO GROWTH 1 DAY Performed at [facility], [place] [address], [place], [place] [address]    Report Status PENDING  Incomplete         Radiology Studies: DG Fluoro Rm 1-60 Min  Result Date: 03/10/2022 CLINICAL DATA:  Encounter for feeding tube placement. EXAM: DG C-ARM 1-60 MIN CONTRAST:  None FLUOROSCOPY: Fluoroscopy Time:  6.6 Radiation Exposure Index (if provided by the fluoroscopic device): Number of Acquired Spot Images: 0 COMPARISON:  None Available. FINDINGS: Multiple attempts to NG tube into the esophagus failed. Feeding tube repeatedly advanced into the trachea with oxygen desaturations. After multiple attempts procedure was aborted. IMPRESSION: Failure to pass feeding tube into esophagus. Endoscopy may be necessary for successful access to the esophagus. Electronically Signed   By: Suzy Bouchard M.D.   On: 03/10/2022 08:50        Scheduled Meds:  docusate sodium  100 mg Oral BID   feeding  supplement (PROSource TF20)  60 mL Per Tube BID   free water  150 mL Per Tube Q4H   insulin aspart  0-9  Units Subcutaneous TID WC   sodium chloride flush  3 mL Intravenous Q12H   tamsulosin  0.4 mg Oral Daily   Warfarin - Pharmacist Dosing Inpatient   Does not apply q1600   Continuous Infusions:  ceFEPime (MAXIPIME) IV 2 g (03/10/22 0854)   dextrose 125 mL/hr at 03/10/22 1033   feeding supplement (OSMOLITE 1.5 CAL)     sodium phosphate 30 mmol in dextrose 5 % 250 mL infusion 30 mmol (03/10/22 0929)   vancomycin 1,000 mg (03/10/22 1033)     LOS: 2 days      Phillips Climes, MD [facility]   To contact the attending provider between 7A-7P or the covering provider during after hours 7P-7A, please log into the web site [email or web address] and access using universal [place] password for that web site. If you do not have the password, please call the hospital operator.  03/10/2022, 1:38 PM

## 2022-03-11 DIAGNOSIS — K449 Diaphragmatic hernia without obstruction or gangrene: Secondary | ICD-10-CM

## 2022-03-11 DIAGNOSIS — R627 Adult failure to thrive: Secondary | ICD-10-CM

## 2022-03-11 DIAGNOSIS — Z7189 Other specified counseling: Secondary | ICD-10-CM | POA: Diagnosis not present

## 2022-03-11 DIAGNOSIS — N189 Chronic kidney disease, unspecified: Secondary | ICD-10-CM | POA: Diagnosis not present

## 2022-03-11 DIAGNOSIS — N179 Acute kidney failure, unspecified: Secondary | ICD-10-CM | POA: Diagnosis not present

## 2022-03-11 DIAGNOSIS — R1310 Dysphagia, unspecified: Secondary | ICD-10-CM | POA: Diagnosis not present

## 2022-03-11 DIAGNOSIS — J189 Pneumonia, unspecified organism: Secondary | ICD-10-CM | POA: Diagnosis not present

## 2022-03-11 DIAGNOSIS — N1832 Chronic kidney disease, stage 3b: Secondary | ICD-10-CM | POA: Diagnosis not present

## 2022-03-11 MED ORDER — MORPHINE SULFATE (PF) 2 MG/ML IV SOLN
2.0000 mg | Freq: Four times a day (QID) | INTRAVENOUS | Status: DC
  Administered 2022-03-12 (×2): 2 mg via INTRAVENOUS
  Filled 2022-03-11 (×2): qty 1

## 2022-03-11 MED ORDER — MORPHINE SULFATE (PF) 2 MG/ML IV SOLN
2.0000 mg | INTRAVENOUS | Status: DC | PRN
  Administered 2022-03-11: 2 mg via INTRAVENOUS
  Filled 2022-03-11: qty 1

## 2022-03-11 NOTE — TOC Progression Note (Signed)
Transition of Care [facility]) - Progression Note    Patient Details  Name: Berley Gambrell MRN: [id number] Date of Birth: [birth year]  Transition of [facility] CM/SW [place], RN Phone Number: 03/11/2022, 3:17 PM  Clinical Narrative:    [facility] consulted for hospice at home. They spoke with son. Equipment ordered for home via hospice, this will be delivered on 1/13. Family needs time to move things around for the equipment.      Barriers to Discharge: Continued Medical Work up  Expected Discharge Plan and [facility] with [facility]                     DME Arranged:  (hospice arranged bed and other DME)                     Social Determinants of Health (SDOH) Interventions SDOH Screenings   Tobacco Use: Low Risk  (03/09/2022)    Readmission Risk Interventions     No data to display

## 2022-03-11 NOTE — Care Management Important Message (Signed)
Important Message  Patient Details  Name: Armaan Pond MRN: [id number] Date of Birth: 20-Dec-1925   Medicare Important Message Given:  Yes     Earon Rivest Montine Circle 03/11/2022, 2:46 PM

## 2022-03-11 NOTE — Progress Notes (Signed)
[facility]) Hospital Liaison Note   Received request from [facility], [facility], for hospice services at home after discharge.    Spoke with Patient's son, [name] to initiate education related to hospice philosophy, services, and team approach to care. [name] verbalized understanding of information given. Per discussion, the plan is for patient to discharge home via EMS once cleared to DC.    DME needs discussed. Patient has the following equipment in the home (Purchased privately): None Patient requests the following equipment for delivery: Hospital Bed and Oxygen  Address verified and is correct in the chart. [name] is the family member to contact to arrange time of equipment delivery.    Please send signed and completed DNR home with patient/family. Please provide prescriptions at discharge as needed to ensure ongoing symptom management.    [facility] information and contact numbers given to family & above information shared with TOC.   Please call with any questions/concerns.    Thank you for the opportunity to participate in this patient's care.   Zigmund Gottron  [facility] Liaison  [phone]

## 2022-03-11 NOTE — Consult Note (Signed)
Consultation Note Date: 03/11/2022   Patient Name: [name]  DOB: Nov 30, 1925  MRN: [id number]  Age / Sex: 89 y.o., male  PCP: Wenda Low, MD Referring Physician: Albertine Patricia, MD  Reason for Consultation:  goals of care  HPI/Patient Profile: 89 y.o. 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 Per family discussion with attending team, patient has been transitioned to full comfort measures only. Palliative consulted for [place].   Primary Decision Maker NEXT OF KIN- son and daughter  Discussion: Chart reviewed including labs, progress notes, imaging from this and previous encounters.  Patient lying in bed, does not respond to my voice or touch. His brow is furrowed and he appears tense.  Met with son and daughter at bedside. They confirm goals are for full comfort and allowing natural dying process. Their main goal is to take patient [place] to [place]. Son spoke with hospice provider today.  Patient's spouse has dementia. We discussed how to communicate patient's illness to her, allowing her to sit beside him through the process, and how to address if she forgets he has died in the future.  Family has much support from their community. 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  Patient's son requested to continue oxygen- patient didn't wear oxygen at baseline, however, patient's son feels pt is more comfortable with it in place. We discussed the process of end of life and the expectation that oxygen levels will decrease over time as part of the dying process. Discussed using comfort medications for  shortness of breath, anxiety, air hunger- but not increasing oxygen or checking oxygen sats. [name] agrees with Equities trader on low flow 2LPM oxygen without increasing it.  Hospice services were discussed. [name] has a friend who recently utilized hospice services and he has been in contact with hospice representative.     SUMMARY OF RECOMMENDATIONS -Full comfort measures only -Morphine '2mg'$  IV q6hr scheduled -Morphine '2mg'$  IV q2hr prn SOB or any signs of discomfort -On discharge, would recommend scripts for: - Morphine Concentrate '10mg'$ /0.14m: '5mg'$  (0.272m sublingual every 1 hour as needed for pain or shortness of breath: Disp 3026m Lorazepam '2mg'$ /ml concentrated solution: '1mg'$  (0.5ml30mublingual every 4 hours as needed for anxiety: Disp 30ml78maldol '2mg'$ /ml solution: 0.'5mg'$  (0.25ml)82mlingual every 4 hours as needed for agitation or nausea: Disp 30ml  55mily also requesting oxygen for home use  Code Status/Advance Care Planning: DNR   Prognosis:   Unable to determine  Discharge Planning: Home with Hospice  Primary Diagnoses: Present on Admission:  HCAP (healthcare-associated pneumonia)  Type 2 diabetes mellitus with vascular disease (HCC)  [facility] [place]  Coronary artery disease  CKD (chronic kidney disease) stage 3, GFR 30-59 ml/min (HCC)  Atrial fibrillation (HCC)  BPH (  benign prostatic hyperplasia)  DNR (do not resuscitate)  Acute kidney injury superimposed on chronic kidney disease ([place])   Review of Systems  Physical Exam  Vital Signs: BP 97/66 (BP Location: Right Arm)   Pulse 70   Temp 98 F (36.7 C) (Axillary)   Resp 18   Wt 70.1 kg   SpO2 96%   BMI 22.17 kg/m  Pain Scale: FLACC   Pain Score: Asleep   SpO2: SpO2: 96 % O2 Device:SpO2: 96 % O2 Flow Rate: .O2 Flow Rate (L/min): 4 L/min  IO: Intake/output summary:  Intake/Output Summary (Last 24 hours) at 03/11/2022 1221 Last data filed at 03/11/2022 0000 Gross per 24 hour  Intake 2585.88 ml  Output 650 ml  Net  1935.88 ml    LBM: Last BM Date :  (unknown) Baseline Weight: Weight: 65.3 kg Most recent weight: Weight: 70.1 kg       Thank you for this consult. Palliative medicine will continue to follow and assist as needed.  Time Total: 90 minutes Greater than 50%  of this time was spent counseling and coordinating care related to the above assessment and plan.  Signed by: Mariana Kaufman, AGNP-C Palliative Medicine    Please contact Palliative Medicine Team phone at [phone] for questions and concerns.  For individual provider: See Shea Evans

## 2022-03-11 NOTE — Progress Notes (Signed)
PROGRESS NOTE    [name]  ASN:[id number] DOB: February 02, 1926 DOA: 03/07/2022 PCP: Wenda Low, MD   Chief Complaint  Patient presents with   Shortness of Breath    Brief Narrative:   [name] is a 89 y.o. male with medical history significant of CAD s/p CABG; DM; stage 4 CKD; and afib with pacemaker presenting with SOB.  He was last admitted from 1/2-7 with CAP and parainfluenza, treated with Rocephin/Azithromycin.  He was discharged to rehab, patient was more lethargic, with new oxygen requirement, with breathing trouble at the nursing home, with progressive weakness, and likely more aspiration events, he was sent back to ED for further evaluation, upon admission he was noted to be hypoxic, with worsening aspiration pneumonia, more lethargic, with multiple electrolyte derangement, admitted for further workup, please see discussion below, patient without of improvement despite treatment, he was transitioned to comfort care.  His daughter feels like he wasn't ready to go home - Saturday he was asking for water but not eating well.  Sunday, he was gray and much less responsive than usual.  The nurse called yesterday and said he was dying and family needed to come up.  He is having confusion, generalized weakness, cough productive of yucky sputum.  ?aspiration - has trouble with applesauce.  He did have a swallow eval last hospitalization, needed thicker liquids.  No fever.  He has chronic pedal edema.         Assessment & Plan:   Principal Problem:   HCAP (healthcare-associated pneumonia) Active Problems:   Atrial fibrillation ([place])   Coronary artery disease   Pacemaker   Acute kidney injury superimposed on chronic kidney disease (HCC)   Type 2 diabetes mellitus with vascular disease ([place])   CKD (chronic kidney disease) stage 3, GFR 30-59 ml/min (HCC)   BPH (benign prostatic hyperplasia)   DNR (do not resuscitate)     HCAP Acute hypoxic respiratory  failure Acute metabolic encephalopathy Hypernatremia AKI on CKD stage III Atrial fibrillation Supratherapeutic INR CAD DM BPH Failure to thrive Protein calorie malnutrition End-of-life care -Patient with very poor prognosis despite treatment with IV fluids, antibiotics, overall rapid deterioration over the last few days, mainly in the setting of irreversible cause contributing to his current illness which is aspiration, difficulty swallowing and eating, failure to thrive and poor oral intake secondary to 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 -At this point after discussion goals of care main goal is comfort, palliative medicine assistance greatly appreciated, likely plan to discharge home with hospice.       DVT prophylaxis: comfort  Code Status: DNR/comfort Family Communication: None at bedside today, discussed with son and daughter at bedside Disposition:   Status is: Inpatient    Consultants:  None   Subjective: No significant events overnight, he denies any complaints today  Objective: Vitals:   03/10/22 1810 03/10/22 2000 03/10/22 2300 03/11/22 0500  BP:  107/60 97/66   Pulse:  65 70   Resp: (!) '23 20 18   '$ Temp:  98 F (36.7 C)    TempSrc:  Axillary    SpO2: 100% 97% 96%   Weight:    70.1 kg    Intake/Output Summary (Last 24 hours) at 03/11/2022 1445 Last data filed at 03/11/2022 0000 Gross per 24 hour  Intake 2585.88 ml  Output 650 ml  Net 1935.88 ml   Filed Weights   03/07/22 2151 03/10/22 0500 03/11/22 0500  Weight: 65.3 kg 61.6 kg 70.1 kg    Examination:   He opens his eyes intermittently, nonverbal, does not follow any commands or answer questions, not appear to be in any distress, significantly diminished air entry in the left lung, extremely frail, thin appearing, regular rate and rhythm, extremities with no edema.      Data Reviewed: I have  personally reviewed following labs and imaging studies  CBC: Recent Labs  Lab 03/07/22 2307 03/07/22 2321 03/09/22 0438 03/10/22 0513  WBC 9.4  --  12.2* 12.5*  NEUTROABS 8.2*  --   --   --   HGB 13.3 15.3  14.6 12.1* 12.0*  HCT 44.8 45.0  43.0 41.0 41.7  MCV 90.7  --  90.7 92.1  PLT 289  --  283 427    Basic Metabolic Panel: Recent Labs  Lab 03/06/22 0217 03/07/22 2307 03/07/22 2321 03/09/22 0438 03/09/22 0901 03/09/22 1641 03/10/22 0513 03/10/22 1650  NA 147* 151* 155*  153* 157*  --  157* 159* 154*  K 3.6 3.8 3.9  3.9 3.2*  --  5.2* 3.6 3.9  CL 105 105 107 115*  --  114* 117* 116*  CO2 31 33*  --  31  --  '29 30 25  '$ GLUCOSE 141* 340* 340* 187*  --  225* 236* 287*  BUN 33* 53* 53* 60*  --  67* 68* 69*  CREATININE 1.47* 2.06* 2.00* 1.91*  --  2.00* 2.01* 1.91*  CALCIUM 8.8* 9.0  --  8.9  --  8.6* 8.9 8.4*  MG 2.2 2.4  --   --  2.4 2.5* 2.6*  --   PHOS  --   --   --   --  2.7 2.2* 2.7  --     GFR: Estimated Creatinine Clearance: 23.4 mL/min (A) (by C-G formula based on SCr of 1.91 mg/dL (H)).  Liver Function Tests: Recent Labs  Lab 03/07/22 2307  AST 22  ALT 16  ALKPHOS 59  BILITOT 1.1  PROT 7.4  ALBUMIN 2.9*    CBG: Recent Labs  Lab 03/10/22 0858 03/10/22 1219 03/10/22 1631 03/10/22 2050 03/10/22 2358  GLUCAP 213* 280* 268* 173* 150*     Recent Results (from the past 240 hour(s))  Resp panel by RT-PCR (RSV, Flu A&B, Covid) Anterior Nasal Swab     Status: None   Collection Time: 03/01/22  8:55 PM   Specimen: Anterior Nasal Swab  Result Value Ref Range Status   SARS Coronavirus 2 by RT PCR NEGATIVE NEGATIVE Final    Comment: (NOTE) SARS-CoV-2 target nucleic acids are NOT DETECTED.  The SARS-CoV-2 RNA is generally detectable in upper respiratory specimens during the acute phase of infection. The lowest concentration of SARS-CoV-2 viral copies this assay can detect is 138 copies/mL. A negative result does not preclude  SARS-Cov-2 infection and should not be used as the sole basis for treatment or other patient management decisions. A negative result may occur with  improper specimen collection/handling, submission of specimen other than nasopharyngeal swab, presence of viral mutation(s) within the areas targeted by this assay, and inadequate number of viral copies(<138 copies/mL). A negative result must be combined with clinical observations, patient history, and epidemiological information. The expected result is Negative.  Fact Sheet for Patients:  EntrepreneurPulse.com.au  Fact Sheet for Healthcare Providers:  IncredibleEmployment.be  This test is no t yet approved or cleared by the [place] FDA and  has been authorized for detection and/or diagnosis of SARS-CoV-2 by  FDA under an Emergency Use Authorization (EUA). This EUA will remain  in effect (meaning this test can be used) for the duration of the COVID-19 declaration under Section 564(b)(1) of the Act, 21 U.S.C.section 360bbb-3(b)(1), unless the authorization is terminated  or revoked sooner.       Influenza A by PCR NEGATIVE NEGATIVE Final   Influenza B by PCR NEGATIVE NEGATIVE Final    Comment: (NOTE) The Xpert Xpress SARS-CoV-2/FLU/RSV plus assay is intended as an aid in the diagnosis of influenza from Nasopharyngeal swab specimens and should not be used as a sole basis for treatment. Nasal washings and aspirates are unacceptable for Xpert Xpress SARS-CoV-2/FLU/RSV testing.  Fact Sheet for Patients: EntrepreneurPulse.com.au  Fact Sheet for Healthcare Providers: IncredibleEmployment.be  This test is not yet approved or cleared by the [place] FDA and has been authorized for detection and/or diagnosis of SARS-CoV-2 by FDA under an Emergency Use Authorization (EUA). This EUA will remain in effect (meaning this test can be used) for the duration of  the COVID-19 declaration under Section 564(b)(1) of the Act, 21 U.S.C. section 360bbb-3(b)(1), unless the authorization is terminated or revoked.     Resp Syncytial Virus by PCR NEGATIVE NEGATIVE Final    Comment: (NOTE) Fact Sheet for Patients: EntrepreneurPulse.com.au  Fact Sheet for Healthcare Providers: IncredibleEmployment.be  This test is not yet approved or cleared by the [place] FDA and has been authorized for detection and/or diagnosis of SARS-CoV-2 by FDA under an Emergency Use Authorization (EUA). This EUA will remain in effect (meaning this test can be used) for the duration of the COVID-19 declaration under Section 564(b)(1) of the Act, 21 U.S.C. section 360bbb-3(b)(1), unless the authorization is terminated or revoked.  Performed at [facility], [place] [address] [place], [place] [address]   Respiratory (~20 pathogens) panel by PCR     Status: Abnormal   Collection Time: 03/02/22  2:03 PM   Specimen: Nasopharyngeal Swab; Respiratory  Result Value Ref Range Status   Adenovirus NOT DETECTED NOT DETECTED Final   Coronavirus 229E NOT DETECTED NOT DETECTED Final    Comment: (NOTE) The Coronavirus on the Respiratory Panel, DOES NOT test for the novel  Coronavirus (2019 nCoV)    Coronavirus HKU1 NOT DETECTED NOT DETECTED Final   Coronavirus NL63 NOT DETECTED NOT DETECTED Final   Coronavirus OC43 NOT DETECTED NOT DETECTED Final   Metapneumovirus NOT DETECTED NOT DETECTED Final   Rhinovirus / Enterovirus NOT DETECTED NOT DETECTED Final   Influenza A NOT DETECTED NOT DETECTED Final   Influenza B NOT DETECTED NOT DETECTED Final   Parainfluenza Virus 1 DETECTED (A) NOT DETECTED Final   Parainfluenza Virus 2 NOT DETECTED NOT DETECTED Final   Parainfluenza Virus 3 NOT DETECTED NOT DETECTED Final   Parainfluenza Virus 4 NOT DETECTED NOT DETECTED Final   Respiratory Syncytial Virus NOT DETECTED NOT DETECTED Final   Bordetella  pertussis NOT DETECTED NOT DETECTED Final   Bordetella Parapertussis NOT DETECTED NOT DETECTED Final   Chlamydophila pneumoniae NOT DETECTED NOT DETECTED Final   Mycoplasma pneumoniae NOT DETECTED NOT DETECTED Final    Comment: Performed at [facility], [place]. [name], [place], [place] [address]  Culture, blood (Routine X 2) w Reflex to ID Panel     Status: None (Preliminary result)   Collection Time: 03/08/22  6:01 PM   Specimen: BLOOD LEFT HAND  Result Value Ref Range Status   Specimen Description BLOOD LEFT HAND  Final   Special Requests  Final    BOTTLES DRAWN AEROBIC AND ANAEROBIC Blood Culture results may not be optimal due to an inadequate volume of blood received in culture bottles   Culture   Final    NO GROWTH 3 DAYS Performed at [facility], [place] [address] [place], [place] [address]    Report Status PENDING  Incomplete  Culture, blood (Routine X 2) w Reflex to ID Panel     Status: None (Preliminary result)   Collection Time: 03/09/22  4:38 AM   Specimen: BLOOD LEFT ARM  Result Value Ref Range Status   Specimen Description BLOOD LEFT ARM  Final   Special Requests   Final    BOTTLES DRAWN AEROBIC AND ANAEROBIC Blood Culture adequate volume   Culture   Final    NO GROWTH 2 DAYS Performed at [facility], [place] [address] [place], [place] [address]    Report Status PENDING  Incomplete         Radiology Studies: DG Fluoro Rm 1-60 Min  Result Date: 03/10/2022 CLINICAL DATA:  Encounter for feeding tube placement. EXAM: DG C-ARM 1-60 MIN CONTRAST:  None FLUOROSCOPY: Fluoroscopy Time:  6.6 Radiation Exposure Index (if provided by the fluoroscopic device): Number of Acquired Spot Images: 0 COMPARISON:  None Available. FINDINGS: Multiple attempts to NG tube into the esophagus failed. Feeding tube repeatedly advanced into the trachea with oxygen desaturations. After multiple attempts procedure was aborted. IMPRESSION: Failure to pass feeding tube into  esophagus. Endoscopy may be necessary for successful access to the esophagus. Electronically Signed   By: Suzy Bouchard M.D.   On: 03/10/2022 08:50        Scheduled Meds:  sodium chloride flush  3 mL Intravenous Q12H   Continuous Infusions:     LOS: 3 days      Phillips Climes, MD [facility]   To contact the attending provider between 7A-7P or the covering provider during after hours 7P-7A, please log into the web site [email or web address] and access using universal [place] password for that web site. If you do not have the password, please call the hospital operator.  03/11/2022, 2:45 PM

## 2022-03-12 DIAGNOSIS — Z515 Encounter for palliative care: Secondary | ICD-10-CM

## 2022-03-12 DIAGNOSIS — J189 Pneumonia, unspecified organism: Secondary | ICD-10-CM | POA: Diagnosis not present

## 2022-03-12 MED ORDER — MORPHINE SULFATE (CONCENTRATE) 10 MG /0.5 ML PO SOLN: 5.0000 mg | ORAL | 0 refills | Status: DC | PRN

## 2022-03-12 MED ORDER — LORAZEPAM 2 MG/ML PO CONC: 2.0000 mg | ORAL | 0 refills | Status: DC | PRN

## 2022-03-12 MED ORDER — LORAZEPAM 2 MG/ML PO CONC: 2.0000 mg | ORAL | 0 refills | Status: AC | PRN

## 2022-03-12 MED ORDER — MORPHINE SULFATE (CONCENTRATE) 10 MG /0.5 ML PO SOLN: 5.0000 mg | ORAL | 0 refills | Status: AC | PRN

## 2022-03-12 NOTE — Discharge Instructions (Signed)
Management per hospice

## 2022-03-12 NOTE — Discharge Summary (Signed)
Physician Discharge Summary  [name] [facility] PJK:[id number] DOB: 01/16/1926 DOA: 03/07/2022  PCP: Wenda Low, MD  Admit date: 03/07/2022 Discharge date: 03/12/2022  Admitted From: (Home) Disposition:  (Home with Hospice)  Recommendations for Outpatient Follow-up:  Management per hospice   Equipment/Devices: Oxygen  Discharge Condition: (hospice) CODE STATUS: ( DNR, Comfort Care)   Brief/Interim Summary:   [name] is a 89 y.o. male with medical history significant of CAD s/p CABG; DM; stage 4 CKD; and afib with pacemaker presenting with SOB.  He was last admitted from 1/2-7 with CAP and parainfluenza, treated with Rocephin/Azithromycin.  He was discharged to rehab, patient was more lethargic, with new oxygen requirement, with breathing trouble at the nursing home, with progressive weakness, and likely more aspiration events, he was sent back to ED for further evaluation, upon admission he was noted to be hypoxic, with worsening aspiration pneumonia, more lethargic, with multiple electrolyte derangement, admitted for further workup, please see discussion below, patient without of improvement despite treatment, he was transitioned to comfort care.        HCAP Acute hypoxic respiratory failure Acute metabolic encephalopathy Hypernatremia AKI on CKD stage III Atrial fibrillation Supratherapeutic INR CAD DM BPH Failure to thrive Protein calorie malnutrition End-of-life care -Patient with very poor prognosis despite treatment with IV fluids, antibiotics, overall rapid deterioration over the last few days, mainly in the setting of irreversible cause contributing to his current illness which is aspiration, difficulty swallowing and eating, failure to thrive and poor oral intake secondary to 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 -At this point after discussion goals of care main  goal is comfort, palliative medicine assistance greatly appreciated,  plan to discharge home with hospice.       Discharge Diagnoses:  Principal Problem:   HCAP (healthcare-associated pneumonia) Active Problems:   Atrial fibrillation ([place])   Coronary artery disease   Pacemaker   Acute kidney injury superimposed on chronic kidney disease ([place])   Type 2 diabetes mellitus with vascular disease ([place])   CKD (chronic kidney disease) stage 3, GFR 30-59 ml/min (HCC)   BPH (benign prostatic hyperplasia)   DNR (do not resuscitate)   Hospice care patient   Palliative care patient    Discharge Instructions  Discharge Instructions     Diet - low sodium heart healthy   Complete by: As directed    Increase activity slowly   Complete by: As directed       Allergies as of 03/12/2022       Reactions   Demerol [meperidine] Other (See Comments)   Causes hallucinations   Meperidine Hcl Other (See Comments)   Nasal Spray Other (See Comments)   Simvastatin Other (See Comments)        Medication List     STOP taking these medications    acetaminophen 325 MG tablet Commonly known as: TYLENOL   amoxicillin 500 MG tablet Commonly known as: AMOXIL   Co Q-10 100 MG Caps   famotidine 20 MG tablet Commonly known as: PEPCID   glimepiride 1 MG tablet Commonly known as: AMARYL   nitroGLYCERIN 0.4 MG SL tablet Commonly known as: NITROSTAT   sodium chloride 0.9 % infusion   tamsulosin 0.4 MG Caps capsule Commonly known as: FLOMAX   torsemide 20 MG tablet Commonly known as: DEMADEX   V-2 High Compression Hose Misc   warfarin 2.5 MG tablet Commonly known as: COUMADIN  TAKE these medications    LORazepam 2 MG/ML concentrated solution Commonly known as: ATIVAN Take 1 mL (2 mg total) by mouth every 4 (four) hours as needed for anxiety or sedation.   morphine CONCENTRATE 10 mg / 0.5 ml concentrated solution Take 0.25 mLs (5 mg total) by mouth every 2 (two) hours as  needed for shortness of breath or moderate pain.        Allergies  Allergen Reactions   Demerol [Meperidine] Other (See Comments)    Causes hallucinations   Meperidine Hcl Other (See Comments)   Nasal Spray Other (See Comments)   Simvastatin Other (See Comments)    Consultations: Palliaitve   Procedures/Studies: DG Fluoro Rm 1-60 Min  Result Date: 03/10/2022 CLINICAL DATA:  Encounter for feeding tube placement. EXAM: DG C-ARM 1-60 MIN CONTRAST:  None FLUOROSCOPY: Fluoroscopy Time:  6.6 Radiation Exposure Index (if provided by the fluoroscopic device): Number of Acquired Spot Images: 0 COMPARISON:  None Available. FINDINGS: Multiple attempts to NG tube into the esophagus failed. Feeding tube repeatedly advanced into the trachea with oxygen desaturations. After multiple attempts procedure was aborted. IMPRESSION: Failure to pass feeding tube into esophagus. Endoscopy may be necessary for successful access to the esophagus. Electronically Signed   By: Suzy Bouchard M.D.   On: 03/10/2022 08:50   CT HEAD WO CONTRAST (5MM)  Result Date: 03/08/2022 CLINICAL DATA:  Mental status change, unknown cause EXAM: CT HEAD WITHOUT CONTRAST TECHNIQUE: Contiguous axial images were obtained from the base of the skull through the vertex without intravenous contrast. RADIATION DOSE REDUCTION: This exam was performed according to the departmental dose-optimization program which includes automated exposure control, adjustment of the mA and/or kV according to patient size and/or use of iterative reconstruction technique. COMPARISON:  10/14/2016 FINDINGS: Brain: Moderate parenchymal volume loss is commensurate with the patient's age appears slightly progressive since prior examination. Moderate periventricular white matter changes are present likely reflecting the sequela of small vessel ischemia, also progressive since prior exam. Remote lacunar infarct within the right putamen and remote cortical infarcts within  the right parietal and occipital lobes are seen, new since prior examination. No evidence of acute intracranial hemorrhage or infarct. No abnormal mass effect or midline shift. No abnormal intra or extra-axial mass lesion or fluid collection. Ventricular size is normal. Cerebellum is unremarkable. Vascular: No hyperdense vessel or unexpected calcification. Skull: Normal. Negative for fracture or focal lesion. Sinuses/Orbits: There is mucosal thickening and lobulated soft tissue demonstrating central calcification within the maxillary and sphenoid sinuses in keeping with changes of chronic sinusitis. Remaining paranasal sinuses are clear. Orbits are unremarkable. Other: Mastoid air cells and middle ear cavities are clear. IMPRESSION: 1. No acute intracranial abnormality. No calvarial fracture. 2. Progressive senescent changes and remote infarcts as outlined above. 3. Chronic left maxillary and sphenoid sinusitis. Electronically Signed   By: Fidela Salisbury M.D.   On: 03/08/2022 01:56   DG Chest Portable 1 View  Result Date: 03/07/2022 CLINICAL DATA:  Shortness of breath with hypoxia. EXAM: PORTABLE CHEST 1 VIEW COMPARISON:  Chest x-ray 03/01/2022.  Chest CT 03/02/2022. FINDINGS: The heart is enlarged, unchanged. Sternotomy wires, prosthetic heart valve and left-sided pacemaker are again seen. There is central pulmonary vascular congestion. There is some patchy opacities in the left lung base with small left pleural effusion. There is no pneumothorax or acute fracture. Left diaphragmatic hernia again seen. IMPRESSION: 1. Cardiomegaly with central pulmonary vascular congestion. 2. Patchy airspace opacities in the left lung base with small left pleural  effusion. Electronically Signed   By: Ronney Asters M.D.   On: 03/07/2022 23:22   DG Swallowing Func-Speech Pathology  Result Date: 03/04/2022 Table formatting from the original result was not included. Objective Swallowing Evaluation: Type of Study: MBS-Modified  Barium Swallow Study  Patient Details Name: [name] MRN: [id number] Date of Birth: 05/17/1925 Today's Date: 03/04/2022 Time: SLP Start Time (ACUTE ONLY): 1330 -SLP Stop Time (ACUTE ONLY): 5732 SLP Time Calculation (min) (ACUTE ONLY): 23 min Past Medical History: Past Medical History: Diagnosis Date  Aortic stenosis   BPH (benign prostatic hypertrophy)   Coronary artery disease   Dermatophytosis of nail   Diabetes mellitus without complication (HCC)   type 2  Diverticulosis   DVT (deep venous thrombosis) (HCC)   Gastroesophageal reflux disease   GERD (gastroesophageal reflux disease)   Gout   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   Kidney stones   PAH (pulmonary artery hypertension) ([place])   PAS 63 on ECHO 09/2012  Permanent atrial fibrillation (HCC)   PVD (peripheral vascular disease) (HCC)   Severe sepsis ([place]) 09/02/2016  Shortness of breath   Symptomatic bradycardia 09/28/2012  s/p Medtronic pacemaker implanted by Dr Rayann Heman  Past Surgical History: Past Surgical History: Procedure Laterality Date  AORTIC VALVE REPLACEMENT  2006  BALLOON DILATION N/A 12/16/2013  Procedure: BALLOON DILATION;  Surgeon: Garlan Fair, MD;  Location: [facility] ENDOSCOPY;  Service: Endoscopy;  Laterality: N/A;  CORONARY ARTERY BYPASS GRAFT    ESOPHAGOGASTRODUODENOSCOPY N/A 12/16/2013  Procedure: ESOPHAGOGASTRODUODENOSCOPY (EGD);  Surgeon: Garlan Fair, MD;  Location: [facility] ENDOSCOPY;  Service: Endoscopy;  Laterality: N/A;  HERNIA REPAIR    PACEMAKER INSERTION  10-04-2012  Medtronic pacemaker implanted by Dr Rayann Heman for symptomatic bradycardia  PARAESOPHAGEAL HERNIA REPAIR    PERMANENT PACEMAKER INSERTION N/A 10/04/2012  Procedure: PERMANENT PACEMAKER INSERTION;  Surgeon: Thompson Grayer, MD;  Location: [facility] CATH LAB;  Service: Cardiovascular;  Laterality: N/A;  TEE WITHOUT CARDIOVERSION N/A 12/25/2014  Procedure: TRANSESOPHAGEAL ECHOCARDIOGRAM (TEE)   (inpatient at [facility]);  Surgeon: Skeet Latch, MD;  Location: [facility] ENDOSCOPY;  Service:  Cardiovascular;  Laterality: N/A; HPI: 89 year old male with DM2, history of AVR on Coumadin, CHB status post PPM, A-fib, CAD, CKD 3, CHF comes into the hospital with shortness of breath, weakness associated with cough and fatigue.  This has been going on for the past week, along with decreased p.o. intake as well as pleuritic type chest pain with deep inspiration.  VQ scan in the ED was low probability for PE, and a CT scan of the chest was concerning for lingular infiltrates/debris. DX wtih Acute hypoxic respiratory failure due to community-acquired pneumonia, parainfluenza infection.  No data recorded  Recommendations for follow up therapy are one component of a multi-disciplinary discharge planning process, led by the attending physician.  Recommendations may be updated based on patient status, additional functional criteria and insurance authorization. Assessment / Plan / Recommendation   03/04/2022   3:00 PM Clinical Impressions Clinical Impression Pt presents with an oropharyngeal dysphagia, although note that images from radiology were not all saved for review. He has generally slow oral prep and posterior propulsion, as well as intermittent oral holding. He did have episodes of premature spillage and reduced epiglottic inversion that resulted in aspiration of thin and nectar thick liquids before the swallow, even though administered in small amounts. He has some strong coughing during the study but it does not necessarily correlate with aspiration. There are times that he silently aspirates, and there are times  that he coughs without any airway invasion. It does not seem to be a reliable marker. Pt did have improved timing for airway protection with honey thick liquids and solids. His reduced base of tongue retraction contributes to vallecular residue, which increases, and cannot be readily cleared, as boluses become thicker. Recommend Dys 2 diet and honey thick liquids for now. SLP Visit Diagnosis Dysphagia,  oropharyngeal phase (R13.12) Impact on safety and function Moderate aspiration risk     03/04/2022   3:00 PM Treatment Recommendations Treatment Recommendations Therapy as outlined in treatment plan below     03/04/2022   3:00 PM Prognosis Prognosis for Safe Diet Advancement Good Barriers to Reach Goals Time post onset;Severity of deficits   03/04/2022   3:00 PM Diet Recommendations SLP Diet Recommendations Dysphagia 2 (Fine chop) solids;Honey thick liquids Liquid Administration via Cup;Straw Medication Administration Crushed with puree Compensations Slow rate;Small sips/bites Postural Changes Seated upright at 90 degrees;Remain semi-upright after after feeds/meals (Comment)     03/04/2022   3:00 PM Other Recommendations Oral Care Recommendations Oral care BID Other Recommendations Order thickener from pharmacy;Prohibited food (jello, ice cream, thin soups);Remove water pitcher Follow Up Recommendations Skilled nursing-short term rehab (<3 hours/day) Functional Status Assessment Patient has had a recent decline in their functional status and demonstrates the ability to make significant improvements in function in a reasonable and predictable amount of time.   03/04/2022   3:00 PM Frequency and Duration  Speech Therapy Frequency (ACUTE ONLY) min 2x/week Treatment Duration 2 weeks     03/04/2022   3:00 PM Oral Phase Oral Phase Impaired Oral - Honey Cup Delayed oral transit Oral - Nectar Teaspoon Delayed oral transit Oral - Thin Teaspoon Delayed oral transit Oral - Puree Delayed oral transit Oral - Regular Delayed oral transit    03/04/2022   3:00 PM Pharyngeal Phase Pharyngeal Phase Impaired Pharyngeal- Honey Cup Reduced tongue base retraction;Pharyngeal residue - valleculae Pharyngeal- Nectar Teaspoon Penetration/Aspiration before swallow Pharyngeal Material enters airway, passes BELOW cords and not ejected out despite cough attempt by patient Pharyngeal- Thin Teaspoon Penetration/Aspiration before swallow Pharyngeal Material  enters airway, passes BELOW cords without attempt by patient to eject out (silent aspiration) Pharyngeal- Puree Reduced tongue base retraction;Pharyngeal residue - valleculae Pharyngeal- Regular Reduced tongue base retraction;Pharyngeal residue - valleculae    03/04/2022   3:00 PM Cervical Esophageal Phase  Cervical Esophageal Phase [facility] Osie Bond., M.A. CCC-SLP Acute Rehabilitation Services Office [phone] Secure chat preferred 03/04/2022, 3:25 PM                     VAS [place] LOWER EXTREMITY VENOUS (DVT) (ONLY [facility] & [facility])  Result Date: 03/02/2022  Lower Venous DVT Study Patient Name:  [name]  Date of Exam:   03/02/2022 Medical Rec #: [id number]            Accession #:    [id number] Date of Birth: 1925-08-28           Patient Gender: M Patient Age:   80 years Exam Location:  [facility] Procedure:      VAS [place] LOWER EXTREMITY VENOUS (DVT) Referring Phys: BRITNI HENDERLY --------------------------------------------------------------------------------  Indications: Left leg edema, erythema.  Risk Factors: CHF. Comparison Study: No prior studies. Performing Technologist: Darlin Coco RDMS, RVT  Examination Guidelines: A complete evaluation includes B-mode imaging, spectral Doppler, color Doppler, and power Doppler as needed of all accessible portions of each vessel. Bilateral testing is considered an integral part of a complete examination. Limited  examinations for reoccurring indications may be performed as noted. The reflux portion of the exam is performed with the patient in reverse Trendelenburg.  +-----+---------------+---------+-----------+----------+--------------+ RIGHTCompressibilityPhasicitySpontaneityPropertiesThrombus Aging +-----+---------------+---------+-----------+----------+--------------+ CFV  Full           No       Yes                                 +-----+---------------+---------+-----------+----------+--------------+    +---------+---------------+---------+-----------+----------+--------------+ LEFT     CompressibilityPhasicitySpontaneityPropertiesThrombus Aging +---------+---------------+---------+-----------+----------+--------------+ CFV      Full           No       Yes                                 +---------+---------------+---------+-----------+----------+--------------+ SFJ      Full                                                        +---------+---------------+---------+-----------+----------+--------------+ FV Prox  Full                                                        +---------+---------------+---------+-----------+----------+--------------+ FV Mid   Full                                                        +---------+---------------+---------+-----------+----------+--------------+ FV DistalFull                                                        +---------+---------------+---------+-----------+----------+--------------+ PFV      Full                                                        +---------+---------------+---------+-----------+----------+--------------+ POP      Full           No       Yes                                 +---------+---------------+---------+-----------+----------+--------------+ PTV      Full                                                        +---------+---------------+---------+-----------+----------+--------------+ PERO     Full                                                        +---------+---------------+---------+-----------+----------+--------------+  Summary: RIGHT: - No evidence of common femoral vein obstruction.  LEFT: - There is no evidence of deep vein thrombosis in the lower extremity.  - No cystic structure found in the popliteal fossa.  -Pulsatile venous waveforms noted, consistent with elevated right heart pressures. *See table(s) above for measurements and observations. Electronically  signed by Orlie Pollen on 03/02/2022 at 8:19:11 PM.    Final    DG Foot 2 Views Left  Result Date: 03/02/2022 CLINICAL DATA:  Left foot pain. EXAM: LEFT FOOT - 2 VIEW COMPARISON:  None Available. FINDINGS: No acute fracture or dislocation is identified. An accessory navicular is noted. Prominent soft tissue swelling is present diffusely along the dorsum of the foot extending across the ankle and into the lower leg. There are widespread atherosclerotic vascular calcifications. IMPRESSION: Soft tissue swelling without acute osseous abnormality identified. Electronically Signed   By: Logan Bores M.D.   On: 03/02/2022 16:18   CT Chest Wo Contrast  Result Date: 03/02/2022 CLINICAL DATA:  Shortness of breath, fever and wheezing. EXAM: CT CHEST WITHOUT CONTRAST TECHNIQUE: Multidetector CT imaging of the chest was performed following the standard protocol without IV contrast. RADIATION DOSE REDUCTION: This exam was performed according to the departmental dose-optimization program which includes automated exposure control, adjustment of the mA and/or kV according to patient size and/or use of iterative reconstruction technique. COMPARISON:  X-ray 03/01/2022.  V/Q scan 03/02/2022 FINDINGS: Cardiovascular: Heart is nonenlarged. Prominent coronary artery calcifications there is also prominent calcifications along the course of the normal caliber thoracic aorta. The aorta has some tortuous course. No significant pericardial effusion. Left upper chest battery pack with leads extending towards the right side of the heart. Pacemaker. Status post median sternotomy. Mediastinum/Nodes: No specific abnormal lymph node enlargement identified in the axillary region or hila on this noncontrast exam. He small less than 1 cm in size in short axis nodes in the mediastinum, not pathologic by size criteria. Severely elevated left hemidiaphragm herniation in the mediastinum of several structures including portions of stomach, pancreas,  small bowel and vasculature. Surgical changes near this area. Please correlate with clinical history. Lungs/Pleura: Associated bandlike opacity left lung base. Atelectasis is favored. Mild right basilar atelectasis or scar as well. No pneumothorax. There is opacification noted of the central airways of the left lower lobe with parenchymal opacities. Also some areas dependently in lingula. Luminal debris along the bronchi. LS versus infiltrate. Aggressive process is not excluded. Please correlate with any history Upper Abdomen: Over left hemidiaphragm hernia as above. The adrenal glands are preserved. Nonobstructive left-sided renal stone. Musculoskeletal: S shaped curvature of the spine multifocal degenerative changes. IMPRESSION: Severely elevated left hemidiaphragm with department of hernia as well. Adjacent parenchymal opacity is identified in the left lower lobe and dependent portion of the lingula with some opacification along bronchi in this location. Possible debris versus lesion. Please correlate with any known history. Additional workup as clinically directed. Pacemaker.  Postop chest. Electronically Signed   By: Jill Side M.D.   On: 03/02/2022 13:13   NM Pulmonary Perfusion  Result Date: 03/02/2022 CLINICAL DATA:  Chest pain.  Shortness of breath. EXAM: NUCLEAR MEDICINE PERFUSION LUNG SCAN TECHNIQUE: Perfusion images were obtained in multiple projections after intravenous injection of radiopharmaceutical. Ventilation scans intentionally deferred if perfusion scan and chest x-ray adequate for interpretation during COVID 19 epidemic. RADIOPHARMACEUTICALS:  3.6 mCi Tc-9mMAA IV COMPARISON:  X-ray 03/01/2022.  And older FINDINGS: There is preserved perfusion of the right lung diffusely. No significant defects.  X-ray demonstrates surgical changes of the left hemithorax with volume loss and elevated left hemidiaphragm. The residual left lung has relatively poor perfusion compared to the right. The  distribution pattern is low suspicion for pulmonary embolism overall. However with the appearance if there is no known specific prior imaging or history dedicated anatomic imaging of the chest with CT may be of some benefit IMPRESSION: Volume loss left hemithorax with relatively poor asymmetric perfusion of the residual left lung compared to right. Pattern is low suspicion for pulmonary embolism. However with the appearance CT imaging of the chest may be of benefit to further delineate Electronically Signed   By: Jill Side M.D.   On: 03/02/2022 12:20   DG Chest Portable 1 View  Result Date: 03/01/2022 CLINICAL DATA:  Shortness of breath. EXAM: PORTABLE CHEST 1 VIEW COMPARISON:  09/05/2016. FINDINGS: The heart is enlarged and the mediastinal contour is stable. Atherosclerotic calcification of the aorta is noted. There is chronic elevation of the left diaphragm with atelectasis or infiltrate at the left lung base. No definite effusion or pneumothorax. No pneumothorax. Sternotomy wires are present over the midline. A single lead pacemaker device is present over the left chest. IMPRESSION: 1. Chronic elevation of the left diaphragm with atelectasis or infiltrate at the left lung base, unchanged from prior exams. 2. Cardiomegaly. Electronically Signed   By: Brett Fairy M.D.   On: 03/01/2022 21:23   (Echo, Carotid, EGD, Colonoscopy, ERCP)    Subjective:  No significant events overnight, unable to provide any complaints Discharge Exam: Vitals:   03/11/22 2047 03/12/22 0944  BP: 125/67 (!) 113/56  Pulse: 64 (!) 59  Resp: 16 (!) 26  Temp: 97.8 F (36.6 C) 97.7 F (36.5 C)  SpO2: 94%    Vitals:   03/10/22 2300 03/11/22 0500 03/11/22 2047 03/12/22 0944  BP: 97/66  125/67 (!) 113/56  Pulse: 70  64 (!) 59  Resp: 18  16 (!) 26  Temp:   97.8 F (36.6 C) 97.7 F (36.5 C)  TempSrc:   Axillary Axillary  SpO2: 96%  94%   Weight:  70.1 kg      General: Obtundent, unresponsive, does not follow any  commands, extremely frail, chronically ill-appearing Cardiovascular: RRR, S1/S2 +, no rubs, no gallops Respiratory: Initial air entry bilaterally, left more than right Abdominal: Soft, NT, ND, bowel sounds +, abdominal scaphoid Extremities: no edema, mild discoloration and cyanosis in lower extremities.    The results of significant diagnostics from this hospitalization (including imaging, microbiology, ancillary and laboratory) are listed below for reference.     Microbiology: Recent Results (from the past 240 hour(s))  Respiratory (~20 pathogens) panel by PCR     Status: Abnormal   Collection Time: 03/02/22  2:03 PM   Specimen: Nasopharyngeal Swab; Respiratory  Result Value Ref Range Status   Adenovirus NOT DETECTED NOT DETECTED Final   Coronavirus 229E NOT DETECTED NOT DETECTED Final    Comment: (NOTE) The Coronavirus on the Respiratory Panel, DOES NOT test for the novel  Coronavirus (2019 nCoV)    Coronavirus HKU1 NOT DETECTED NOT DETECTED Final   Coronavirus NL63 NOT DETECTED NOT DETECTED Final   Coronavirus OC43 NOT DETECTED NOT DETECTED Final   Metapneumovirus NOT DETECTED NOT DETECTED Final   Rhinovirus / Enterovirus NOT DETECTED NOT DETECTED Final   Influenza A NOT DETECTED NOT DETECTED Final   Influenza B NOT DETECTED NOT DETECTED Final   Parainfluenza Virus 1 DETECTED (A) NOT DETECTED Final   Parainfluenza  Virus 2 NOT DETECTED NOT DETECTED Final   Parainfluenza Virus 3 NOT DETECTED NOT DETECTED Final   Parainfluenza Virus 4 NOT DETECTED NOT DETECTED Final   Respiratory Syncytial Virus NOT DETECTED NOT DETECTED Final   Bordetella pertussis NOT DETECTED NOT DETECTED Final   Bordetella Parapertussis NOT DETECTED NOT DETECTED Final   Chlamydophila pneumoniae NOT DETECTED NOT DETECTED Final   Mycoplasma pneumoniae NOT DETECTED NOT DETECTED Final    Comment: Performed at [facility], [employer] [address], [place], [place] [address]  Culture, blood (Routine X 2) w Reflex  to ID Panel     Status: None (Preliminary result)   Collection Time: 03/08/22  6:01 PM   Specimen: BLOOD LEFT HAND  Result Value Ref Range Status   Specimen Description BLOOD LEFT HAND  Final   Special Requests   Final    BOTTLES DRAWN AEROBIC AND ANAEROBIC Blood Culture results may not be optimal due to an inadequate volume of blood received in culture bottles   Culture   Final    NO GROWTH 4 DAYS Performed at [facility], [place] [address] [place], [place] [address]    Report Status PENDING  Incomplete  Culture, blood (Routine X 2) w Reflex to ID Panel     Status: None (Preliminary result)   Collection Time: 03/09/22  4:38 AM   Specimen: BLOOD LEFT ARM  Result Value Ref Range Status   Specimen Description BLOOD LEFT ARM  Final   Special Requests   Final    BOTTLES DRAWN AEROBIC AND ANAEROBIC Blood Culture adequate volume   Culture   Final    NO GROWTH 3 DAYS Performed at [facility], [place] [address] [place], [place] [address]    Report Status PENDING  Incomplete     Labs: BNP (last 3 results) Recent Labs    03/01/22 2055 03/07/22 2307  BNP 308.0* 939.0*   Basic Metabolic Panel: Recent Labs  Lab 03/06/22 0217 03/07/22 2307 03/07/22 2321 03/09/22 0438 03/09/22 0901 03/09/22 1641 03/10/22 0513 03/10/22 1650  NA 147* 151* 155*  153* 157*  --  157* 159* 154*  K 3.6 3.8 3.9  3.9 3.2*  --  5.2* 3.6 3.9  CL 105 105 107 115*  --  114* 117* 116*  CO2 31 33*  --  31  --  '29 30 25  '$ GLUCOSE 141* 340* 340* 187*  --  225* 236* 287*  BUN 33* 53* 53* 60*  --  67* 68* 69*  CREATININE 1.47* 2.06* 2.00* 1.91*  --  2.00* 2.01* 1.91*  CALCIUM 8.8* 9.0  --  8.9  --  8.6* 8.9 8.4*  MG 2.2 2.4  --   --  2.4 2.5* 2.6*  --   PHOS  --   --   --   --  2.7 2.2* 2.7  --    Liver Function Tests: Recent Labs  Lab 03/07/22 2307  AST 22  ALT 16  ALKPHOS 59  BILITOT 1.1  PROT 7.4  ALBUMIN 2.9*   No results for input(s): "LIPASE", "AMYLASE" in the last 168 hours. No  results for input(s): "AMMONIA" in the last 168 hours. CBC: Recent Labs  Lab 03/07/22 2307 03/07/22 2321 03/09/22 0438 03/10/22 0513  WBC 9.4  --  12.2* 12.5*  NEUTROABS 8.2*  --   --   --   HGB 13.3 15.3  14.6 12.1* 12.0*  HCT 44.8 45.0  43.0 41.0 41.7  MCV 90.7  --  90.7 92.1  PLT 289  --  283 262   Cardiac Enzymes: No results for input(s): "CKTOTAL", "CKMB", "CKMBINDEX", "TROPONINI" in the last 168 hours. BNP: Invalid input(s): "POCBNP" CBG: Recent Labs  Lab 03/10/22 0858 03/10/22 1219 03/10/22 1631 03/10/22 2050 03/10/22 2358  GLUCAP 213* 280* 268* 173* 150*   D-Dimer No results for input(s): "DDIMER" in the last 72 hours. Hgb A1c No results for input(s): "HGBA1C" in the last 72 hours. Lipid Profile No results for input(s): "CHOL", "HDL", "LDLCALC", "TRIG", "CHOLHDL", "LDLDIRECT" in the last 72 hours. Thyroid function studies No results for input(s): "TSH", "T4TOTAL", "T3FREE", "THYROIDAB" in the last 72 hours.  Invalid input(s): "FREET3" Anemia work up No results for input(s): "VITAMINB12", "FOLATE", "FERRITIN", "TIBC", "IRON", "RETICCTPCT" in the last 72 hours. Urinalysis    Component Value Date/Time   COLORURINE YELLOW 09/05/2016 2058   APPEARANCEUR HAZY (A) 09/05/2016 2058   LABSPEC 1.026 09/05/2016 2058   PHURINE 5.0 09/05/2016 2058   GLUCOSEU >=500 (A) 09/05/2016 2058   HGBUR SMALL (A) 09/05/2016 2058   BILIRUBINUR NEGATIVE 09/05/2016 2058   KETONESUR 20 (A) 09/05/2016 2058   PROTEINUR 30 (A) 09/05/2016 2058   UROBILINOGEN 0.2 12/23/2014 0708   NITRITE NEGATIVE 09/05/2016 2058   LEUKOCYTESUR NEGATIVE 09/05/2016 2058   Sepsis Labs Recent Labs  Lab 03/07/22 2307 03/09/22 0438 03/10/22 0513  WBC 9.4 12.2* 12.5*   Microbiology Recent Results (from the past 240 hour(s))  Respiratory (~20 pathogens) panel by PCR     Status: Abnormal   Collection Time: 03/02/22  2:03 PM   Specimen: Nasopharyngeal Swab; Respiratory  Result Value Ref Range  Status   Adenovirus NOT DETECTED NOT DETECTED Final   Coronavirus 229E NOT DETECTED NOT DETECTED Final    Comment: (NOTE) The Coronavirus on the Respiratory Panel, DOES NOT test for the novel  Coronavirus (2019 nCoV)    Coronavirus HKU1 NOT DETECTED NOT DETECTED Final   Coronavirus NL63 NOT DETECTED NOT DETECTED Final   Coronavirus OC43 NOT DETECTED NOT DETECTED Final   Metapneumovirus NOT DETECTED NOT DETECTED Final   Rhinovirus / Enterovirus NOT DETECTED NOT DETECTED Final   Influenza A NOT DETECTED NOT DETECTED Final   Influenza B NOT DETECTED NOT DETECTED Final   Parainfluenza Virus 1 DETECTED (A) NOT DETECTED Final   Parainfluenza Virus 2 NOT DETECTED NOT DETECTED Final   Parainfluenza Virus 3 NOT DETECTED NOT DETECTED Final   Parainfluenza Virus 4 NOT DETECTED NOT DETECTED Final   Respiratory Syncytial Virus NOT DETECTED NOT DETECTED Final   Bordetella pertussis NOT DETECTED NOT DETECTED Final   Bordetella Parapertussis NOT DETECTED NOT DETECTED Final   Chlamydophila pneumoniae NOT DETECTED NOT DETECTED Final   Mycoplasma pneumoniae NOT DETECTED NOT DETECTED Final    Comment: Performed at [facility], [place]. [address], [place], [place] [address]  Culture, blood (Routine X 2) w Reflex to ID Panel     Status: None (Preliminary result)   Collection Time: 03/08/22  6:01 PM   Specimen: BLOOD LEFT HAND  Result Value Ref Range Status   Specimen Description BLOOD LEFT HAND  Final   Special Requests   Final    BOTTLES DRAWN AEROBIC AND ANAEROBIC Blood Culture results may not be optimal due to an inadequate volume of blood received in culture bottles   Culture   Final    NO GROWTH 4 DAYS Performed at [facility], [place] [address], [place], [place] [address]    Report Status PENDING  Incomplete  Culture, blood (Routine X 2) w Reflex to ID Panel     Status: None (Preliminary result)   Collection Time: 03/09/22  4:38 AM   Specimen: BLOOD LEFT ARM  Result Value Ref Range  Status   Specimen Description BLOOD LEFT ARM  Final   Special Requests   Final    BOTTLES DRAWN AEROBIC AND ANAEROBIC Blood Culture adequate volume   Culture   Final    NO GROWTH 3 DAYS Performed at [facility], [address] [place], [place] [address]    Report Status PENDING  Incomplete     Time coordinating discharge:  30 minutes  SIGNED:   Phillips Climes, MD  [facility] 03/12/2022, 1:24 PM Pager   If 7PM-7AM, please contact night-coverage [email or web address]

## 2022-03-12 NOTE — TOC Transition Note (Addendum)
Transition of Care [facility]) - CM/SW Discharge Note   Patient Details  Name: [name] MRN: [id number] Date of Birth: [birth year]  [facility] CM/SW Contact:  Carles Collet, RN Phone Number: 03/12/2022, 12:44 PM   Clinical Narrative:     Notified by MD that patient will DC to home today with hospice services. Referral made yesterday to [facility], confirmed with liaison on call that they are ready to accept him if he discahrges today. Spoke w patient's son [name], confirmed that theya re still waiting for delivery of bed and oxygen at home, and it is anticipated soon. [name] has my number and understands to call me when DME has been delivered so I can call [facility] for transportation home.  Verified w MD that DNR is on chart.  Continuing to follow...  14:30 Received call back from patient's son [name] who confirmed that all DME has been delivered and they are ready for patient to come home. Confirmed address, [address] and timing requested of 16:00.  [facility] called and forms left with unit secretary  Final next level of care: Home w Hospice Care Barriers to Discharge: Continued Medical Work up   Patient Goals and CMS Choice   Choice offered to / list presented to : Adult Children  Discharge Placement                Patient chooses bed at:  [facility])        Discharge Plan and Services Additional resources added to the After Visit Summary for                  DME Arranged:  (hospice arranged bed and other DME)                    Social Determinants of Health (SDOH) Interventions SDOH Screenings   Tobacco Use: Low Risk  (03/09/2022)     Readmission Risk Interventions     No data to display

## 2022-03-13 LAB — CULTURE, BLOOD (ROUTINE X 2): Culture: NO GROWTH

## 2022-03-14 ENCOUNTER — Other Ambulatory Visit: Payer: Self-pay | Admitting: Interventional Cardiology

## 2022-03-14 LAB — CULTURE, BLOOD (ROUTINE X 2)
Culture: NO GROWTH
Special Requests: ADEQUATE

## 2022-03-15 ENCOUNTER — Other Ambulatory Visit: Payer: Medicare Other

## 2022-03-16 ENCOUNTER — Ambulatory Visit: Payer: Medicare Other

## 2022-03-31 DEATH — deceased

## 2022-04-04 ENCOUNTER — Ambulatory Visit (HOSPITAL_COMMUNITY): Admission: RE | Admit: 2022-04-04 | Payer: Medicare Other | Source: Home / Self Care | Admitting: Internal Medicine

## 2022-04-04 SURGERY — PPM GENERATOR CHANGEOUT

## 2022-06-08 ENCOUNTER — Encounter: Payer: Medicare Other | Admitting: Internal Medicine
